# Patient Record
Sex: Female | Born: 1965 | Race: Black or African American | Hispanic: No | Marital: Married | Smoking: Never smoker
Health system: Southern US, Community
[De-identification: ages and names within clinical notes are randomized; demographics above are authoritative.]

## PROBLEM LIST (undated history)

## (undated) DIAGNOSIS — I209 Angina pectoris, unspecified: Secondary | ICD-10-CM

## (undated) DIAGNOSIS — M2550 Pain in unspecified joint: Secondary | ICD-10-CM

## (undated) DIAGNOSIS — F419 Anxiety disorder, unspecified: Secondary | ICD-10-CM

## (undated) DIAGNOSIS — IMO0001 Reserved for inherently not codable concepts without codable children: Secondary | ICD-10-CM

## (undated) DIAGNOSIS — I4891 Unspecified atrial fibrillation: Secondary | ICD-10-CM

## (undated) DIAGNOSIS — R7303 Prediabetes: Secondary | ICD-10-CM

## (undated) DIAGNOSIS — J42 Unspecified chronic bronchitis: Secondary | ICD-10-CM

## (undated) DIAGNOSIS — M549 Dorsalgia, unspecified: Secondary | ICD-10-CM

## (undated) DIAGNOSIS — R519 Headache, unspecified: Secondary | ICD-10-CM

## (undated) DIAGNOSIS — R079 Chest pain, unspecified: Secondary | ICD-10-CM

## (undated) DIAGNOSIS — E559 Vitamin D deficiency, unspecified: Secondary | ICD-10-CM

## (undated) DIAGNOSIS — R51 Headache: Secondary | ICD-10-CM

## (undated) DIAGNOSIS — S069X9A Unspecified intracranial injury with loss of consciousness of unspecified duration, initial encounter: Secondary | ICD-10-CM

## (undated) DIAGNOSIS — K219 Gastro-esophageal reflux disease without esophagitis: Secondary | ICD-10-CM

## (undated) DIAGNOSIS — N83209 Unspecified ovarian cyst, unspecified side: Secondary | ICD-10-CM

## (undated) DIAGNOSIS — R569 Unspecified convulsions: Secondary | ICD-10-CM

## (undated) DIAGNOSIS — G8929 Other chronic pain: Secondary | ICD-10-CM

## (undated) DIAGNOSIS — D219 Benign neoplasm of connective and other soft tissue, unspecified: Secondary | ICD-10-CM

## (undated) DIAGNOSIS — S0990XA Unspecified injury of head, initial encounter: Secondary | ICD-10-CM

## (undated) DIAGNOSIS — T4145XA Adverse effect of unspecified anesthetic, initial encounter: Secondary | ICD-10-CM

## (undated) DIAGNOSIS — T8859XA Other complications of anesthesia, initial encounter: Secondary | ICD-10-CM

## (undated) DIAGNOSIS — G459 Transient cerebral ischemic attack, unspecified: Secondary | ICD-10-CM

## (undated) HISTORY — DX: Transient cerebral ischemic attack, unspecified: G45.9

## (undated) HISTORY — DX: Chest pain, unspecified: R07.9

## (undated) HISTORY — DX: Unspecified atrial fibrillation: I48.91

## (undated) HISTORY — PX: OVARIAN CYST REMOVAL: SHX89

## (undated) HISTORY — DX: Vitamin D deficiency, unspecified: E55.9

## (undated) HISTORY — DX: Unspecified injury of head, initial encounter: S09.90XA

## (undated) HISTORY — DX: Benign neoplasm of connective and other soft tissue, unspecified: D21.9

## (undated) HISTORY — PX: APPENDECTOMY: SHX54

## (undated) HISTORY — DX: Unspecified ovarian cyst, unspecified side: N83.209

## (undated) HISTORY — DX: Pain in unspecified joint: M25.50

## (undated) HISTORY — PX: HERNIA REPAIR: SHX51

## (undated) HISTORY — DX: Unspecified intracranial injury with loss of consciousness of unspecified duration, initial encounter: S06.9X9A

---
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
## 2002-01-21 ENCOUNTER — Emergency Department (HOSPITAL_COMMUNITY): Admission: EM | Admit: 2002-01-21 | Discharge: 2002-01-21 | Payer: Self-pay | Admitting: Emergency Medicine

## 2002-05-26 ENCOUNTER — Emergency Department (HOSPITAL_COMMUNITY): Admission: EM | Admit: 2002-05-26 | Discharge: 2002-05-26 | Payer: Self-pay | Admitting: Emergency Medicine

## 2002-11-15 ENCOUNTER — Encounter: Payer: Self-pay | Admitting: Emergency Medicine

## 2002-11-15 ENCOUNTER — Emergency Department (HOSPITAL_COMMUNITY): Admission: EM | Admit: 2002-11-15 | Discharge: 2002-11-16 | Payer: Self-pay | Admitting: Emergency Medicine

## 2003-01-25 ENCOUNTER — Emergency Department (HOSPITAL_COMMUNITY): Admission: EM | Admit: 2003-01-25 | Discharge: 2003-01-25 | Payer: Self-pay | Admitting: Emergency Medicine

## 2003-04-30 ENCOUNTER — Encounter: Payer: Self-pay | Admitting: Emergency Medicine

## 2003-04-30 ENCOUNTER — Emergency Department (HOSPITAL_COMMUNITY): Admission: EM | Admit: 2003-04-30 | Discharge: 2003-04-30 | Payer: Self-pay | Admitting: Internal Medicine

## 2003-08-28 HISTORY — PX: ABDOMINAL HYSTERECTOMY: SHX81

## 2004-07-04 ENCOUNTER — Emergency Department (HOSPITAL_COMMUNITY): Admission: EM | Admit: 2004-07-04 | Discharge: 2004-07-04 | Payer: Self-pay | Admitting: Emergency Medicine

## 2004-08-30 ENCOUNTER — Ambulatory Visit: Payer: Self-pay | Admitting: Obstetrics and Gynecology

## 2004-11-17 ENCOUNTER — Emergency Department (HOSPITAL_COMMUNITY): Admission: EM | Admit: 2004-11-17 | Discharge: 2004-11-17 | Payer: Self-pay | Admitting: Emergency Medicine

## 2005-01-11 ENCOUNTER — Emergency Department (HOSPITAL_COMMUNITY): Admission: EM | Admit: 2005-01-11 | Discharge: 2005-01-11 | Payer: Self-pay | Admitting: Emergency Medicine

## 2005-03-05 ENCOUNTER — Ambulatory Visit: Payer: Self-pay | Admitting: Obstetrics and Gynecology

## 2005-05-07 ENCOUNTER — Emergency Department (HOSPITAL_COMMUNITY): Admission: EM | Admit: 2005-05-07 | Discharge: 2005-05-07 | Payer: Self-pay | Admitting: Emergency Medicine

## 2005-06-27 ENCOUNTER — Ambulatory Visit: Payer: Self-pay | Admitting: Obstetrics and Gynecology

## 2005-11-01 ENCOUNTER — Emergency Department (HOSPITAL_COMMUNITY): Admission: EM | Admit: 2005-11-01 | Discharge: 2005-11-02 | Payer: Self-pay | Admitting: Emergency Medicine

## 2006-01-01 ENCOUNTER — Ambulatory Visit: Admission: RE | Admit: 2006-01-01 | Discharge: 2006-01-01 | Payer: Self-pay | Admitting: Gynecologic Oncology

## 2006-01-21 ENCOUNTER — Ambulatory Visit: Payer: Self-pay | Admitting: Obstetrics and Gynecology

## 2006-01-28 ENCOUNTER — Ambulatory Visit (HOSPITAL_COMMUNITY): Admission: RE | Admit: 2006-01-28 | Discharge: 2006-01-28 | Payer: Self-pay | Admitting: Gynecologic Oncology

## 2006-02-05 ENCOUNTER — Encounter (INDEPENDENT_AMBULATORY_CARE_PROVIDER_SITE_OTHER): Payer: Self-pay | Admitting: *Deleted

## 2006-02-05 ENCOUNTER — Ambulatory Visit (HOSPITAL_COMMUNITY): Admission: RE | Admit: 2006-02-05 | Discharge: 2006-02-05 | Payer: Self-pay | Admitting: Gynecologic Oncology

## 2006-02-19 ENCOUNTER — Ambulatory Visit: Admission: RE | Admit: 2006-02-19 | Discharge: 2006-02-19 | Payer: Self-pay | Admitting: Gynecologic Oncology

## 2006-09-10 ENCOUNTER — Emergency Department (HOSPITAL_COMMUNITY): Admission: EM | Admit: 2006-09-10 | Discharge: 2006-09-10 | Payer: Self-pay | Admitting: Emergency Medicine

## 2006-09-29 ENCOUNTER — Emergency Department: Payer: Self-pay | Admitting: Emergency Medicine

## 2007-04-17 ENCOUNTER — Emergency Department (HOSPITAL_COMMUNITY): Admission: EM | Admit: 2007-04-17 | Discharge: 2007-04-17 | Payer: Self-pay | Admitting: Emergency Medicine

## 2007-04-18 ENCOUNTER — Emergency Department: Payer: Self-pay | Admitting: Emergency Medicine

## 2007-04-24 ENCOUNTER — Ambulatory Visit: Payer: Self-pay | Admitting: Obstetrics and Gynecology

## 2007-05-22 ENCOUNTER — Observation Stay (HOSPITAL_COMMUNITY): Admission: EM | Admit: 2007-05-22 | Discharge: 2007-05-24 | Payer: Self-pay | Admitting: Emergency Medicine

## 2007-05-22 ENCOUNTER — Encounter (INDEPENDENT_AMBULATORY_CARE_PROVIDER_SITE_OTHER): Payer: Self-pay | Admitting: Internal Medicine

## 2007-05-23 ENCOUNTER — Ambulatory Visit: Payer: Self-pay | Admitting: Cardiology

## 2007-05-28 ENCOUNTER — Ambulatory Visit: Payer: Self-pay | Admitting: Cardiology

## 2007-05-28 ENCOUNTER — Observation Stay (HOSPITAL_COMMUNITY): Admission: EM | Admit: 2007-05-28 | Discharge: 2007-05-31 | Payer: Self-pay | Admitting: Emergency Medicine

## 2007-06-09 ENCOUNTER — Ambulatory Visit: Payer: Self-pay | Admitting: Gastroenterology

## 2007-07-01 ENCOUNTER — Ambulatory Visit: Payer: Self-pay | Admitting: Cardiology

## 2007-11-21 ENCOUNTER — Emergency Department (HOSPITAL_COMMUNITY): Admission: EM | Admit: 2007-11-21 | Discharge: 2007-11-22 | Payer: Self-pay | Admitting: Emergency Medicine

## 2008-01-31 ENCOUNTER — Emergency Department: Payer: Self-pay | Admitting: Emergency Medicine

## 2008-04-28 ENCOUNTER — Ambulatory Visit: Payer: Self-pay | Admitting: Obstetrics and Gynecology

## 2008-06-07 ENCOUNTER — Emergency Department (HOSPITAL_BASED_OUTPATIENT_CLINIC_OR_DEPARTMENT_OTHER): Admission: EM | Admit: 2008-06-07 | Discharge: 2008-06-07 | Payer: Self-pay | Admitting: Emergency Medicine

## 2008-06-29 ENCOUNTER — Emergency Department (HOSPITAL_BASED_OUTPATIENT_CLINIC_OR_DEPARTMENT_OTHER): Admission: EM | Admit: 2008-06-29 | Discharge: 2008-06-29 | Payer: Self-pay | Admitting: Emergency Medicine

## 2008-11-17 ENCOUNTER — Emergency Department (HOSPITAL_BASED_OUTPATIENT_CLINIC_OR_DEPARTMENT_OTHER): Admission: EM | Admit: 2008-11-17 | Discharge: 2008-11-17 | Payer: Self-pay | Admitting: Emergency Medicine

## 2009-01-05 ENCOUNTER — Ambulatory Visit: Payer: Self-pay | Admitting: Orthopedic Surgery

## 2009-03-02 ENCOUNTER — Emergency Department (HOSPITAL_BASED_OUTPATIENT_CLINIC_OR_DEPARTMENT_OTHER): Admission: EM | Admit: 2009-03-02 | Discharge: 2009-03-02 | Payer: Self-pay | Admitting: Emergency Medicine

## 2009-03-02 ENCOUNTER — Ambulatory Visit: Payer: Self-pay | Admitting: Diagnostic Radiology

## 2009-03-22 ENCOUNTER — Emergency Department (HOSPITAL_COMMUNITY): Admission: EM | Admit: 2009-03-22 | Discharge: 2009-03-22 | Payer: Self-pay | Admitting: Emergency Medicine

## 2009-04-29 ENCOUNTER — Ambulatory Visit: Payer: Self-pay | Admitting: Obstetrics & Gynecology

## 2009-04-29 ENCOUNTER — Emergency Department (HOSPITAL_BASED_OUTPATIENT_CLINIC_OR_DEPARTMENT_OTHER): Admission: EM | Admit: 2009-04-29 | Discharge: 2009-04-30 | Payer: Self-pay | Admitting: Emergency Medicine

## 2009-05-28 ENCOUNTER — Emergency Department (HOSPITAL_BASED_OUTPATIENT_CLINIC_OR_DEPARTMENT_OTHER): Admission: EM | Admit: 2009-05-28 | Discharge: 2009-05-28 | Payer: Self-pay | Admitting: Emergency Medicine

## 2009-09-18 ENCOUNTER — Emergency Department (HOSPITAL_BASED_OUTPATIENT_CLINIC_OR_DEPARTMENT_OTHER): Admission: EM | Admit: 2009-09-18 | Discharge: 2009-09-18 | Payer: Self-pay | Admitting: Advanced Practice Midwife

## 2009-09-22 ENCOUNTER — Emergency Department: Payer: Self-pay | Admitting: Internal Medicine

## 2009-11-13 ENCOUNTER — Emergency Department (HOSPITAL_BASED_OUTPATIENT_CLINIC_OR_DEPARTMENT_OTHER): Admission: EM | Admit: 2009-11-13 | Discharge: 2009-11-13 | Payer: Self-pay | Admitting: Emergency Medicine

## 2009-11-17 ENCOUNTER — Ambulatory Visit: Payer: Self-pay | Admitting: Diagnostic Radiology

## 2009-11-17 ENCOUNTER — Emergency Department (HOSPITAL_BASED_OUTPATIENT_CLINIC_OR_DEPARTMENT_OTHER): Admission: EM | Admit: 2009-11-17 | Discharge: 2009-11-17 | Payer: Self-pay | Admitting: Emergency Medicine

## 2010-02-25 ENCOUNTER — Emergency Department (HOSPITAL_BASED_OUTPATIENT_CLINIC_OR_DEPARTMENT_OTHER): Admission: EM | Admit: 2010-02-25 | Discharge: 2010-02-26 | Payer: Self-pay | Admitting: Emergency Medicine

## 2010-03-07 ENCOUNTER — Emergency Department: Payer: Self-pay | Admitting: Emergency Medicine

## 2010-05-10 ENCOUNTER — Ambulatory Visit: Payer: Self-pay | Admitting: Obstetrics & Gynecology

## 2010-05-29 ENCOUNTER — Emergency Department (HOSPITAL_BASED_OUTPATIENT_CLINIC_OR_DEPARTMENT_OTHER): Admission: EM | Admit: 2010-05-29 | Discharge: 2010-05-29 | Payer: Self-pay | Admitting: Emergency Medicine

## 2010-10-20 ENCOUNTER — Emergency Department (INDEPENDENT_AMBULATORY_CARE_PROVIDER_SITE_OTHER): Payer: BC Managed Care – PPO

## 2010-10-20 ENCOUNTER — Emergency Department (HOSPITAL_BASED_OUTPATIENT_CLINIC_OR_DEPARTMENT_OTHER)
Admission: EM | Admit: 2010-10-20 | Discharge: 2010-10-20 | Disposition: A | Discharge status: Home / Self Care | Payer: BC Managed Care – PPO | Attending: Emergency Medicine | Admitting: Emergency Medicine

## 2010-10-20 DIAGNOSIS — Y92009 Unspecified place in unspecified non-institutional (private) residence as the place of occurrence of the external cause: Secondary | ICD-10-CM | POA: Insufficient documentation

## 2010-10-20 DIAGNOSIS — S93609A Unspecified sprain of unspecified foot, initial encounter: Secondary | ICD-10-CM | POA: Insufficient documentation

## 2010-10-20 DIAGNOSIS — G40909 Epilepsy, unspecified, not intractable, without status epilepticus: Secondary | ICD-10-CM | POA: Insufficient documentation

## 2010-10-20 DIAGNOSIS — M79609 Pain in unspecified limb: Secondary | ICD-10-CM

## 2010-10-20 DIAGNOSIS — W208XXA Other cause of strike by thrown, projected or falling object, initial encounter: Secondary | ICD-10-CM

## 2010-10-20 DIAGNOSIS — Z79899 Other long term (current) drug therapy: Secondary | ICD-10-CM | POA: Insufficient documentation

## 2010-10-20 DIAGNOSIS — J45909 Unspecified asthma, uncomplicated: Secondary | ICD-10-CM | POA: Insufficient documentation

## 2010-10-20 DIAGNOSIS — M7989 Other specified soft tissue disorders: Secondary | ICD-10-CM | POA: Insufficient documentation

## 2010-11-19 LAB — URINALYSIS, ROUTINE W REFLEX MICROSCOPIC
Bilirubin Urine: NEGATIVE
Glucose, UA: NEGATIVE mg/dL
Hgb urine dipstick: NEGATIVE
Ketones, ur: NEGATIVE mg/dL
Nitrite: NEGATIVE
Protein, ur: NEGATIVE mg/dL
Specific Gravity, Urine: 1.022 (ref 1.005–1.030)
Urobilinogen, UA: 1 mg/dL (ref 0.0–1.0)
pH: 7 (ref 5.0–8.0)

## 2010-11-30 LAB — URINE CULTURE: Colony Count: NO GROWTH

## 2010-11-30 LAB — CBC
HCT: 45.3 % (ref 36.0–46.0)
Hemoglobin: 15.1 g/dL — ABNORMAL HIGH (ref 12.0–15.0)
MCHC: 33.4 g/dL (ref 30.0–36.0)
MCV: 82.6 fL (ref 78.0–100.0)
Platelets: 192 10*3/uL (ref 150–400)
RBC: 5.48 MIL/uL — ABNORMAL HIGH (ref 3.87–5.11)
RDW: 14.6 % (ref 11.5–15.5)
WBC: 6.6 10*3/uL (ref 4.0–10.5)

## 2010-11-30 LAB — COMPREHENSIVE METABOLIC PANEL
ALT: 24 U/L (ref 0–35)
AST: 20 U/L (ref 0–37)
Albumin: 4.5 g/dL (ref 3.5–5.2)
Alkaline Phosphatase: 132 U/L — ABNORMAL HIGH (ref 39–117)
BUN: 14 mg/dL (ref 6–23)
CO2: 26 mEq/L (ref 19–32)
Calcium: 9.4 mg/dL (ref 8.4–10.5)
Chloride: 108 mEq/L (ref 96–112)
Creatinine, Ser: 1 mg/dL (ref 0.4–1.2)
GFR calc Af Amer: >60 mL/min (ref >60)
GFR calc non Af Amer: >60 mL/min (ref >60)
Glucose, Bld: 112 mg/dL — ABNORMAL HIGH (ref 70–99)
Potassium: 4.4 mEq/L (ref 3.5–5.1)
Sodium: 143 mEq/L (ref 135–145)
Total Bilirubin: 0.5 mg/dL (ref 0.3–1.2)
Total Protein: 8.5 g/dL — ABNORMAL HIGH (ref 6.0–8.3)

## 2010-11-30 LAB — URINALYSIS, ROUTINE W REFLEX MICROSCOPIC
Bilirubin Urine: NEGATIVE
Ketones, ur: 15 mg/dL — AB
Leukocytes, UA: NEGATIVE
Nitrite: NEGATIVE
Protein, ur: NEGATIVE mg/dL
Urobilinogen, UA: 1 mg/dL (ref 0.0–1.0)

## 2010-11-30 LAB — DIFFERENTIAL
Basophils Absolute: 0.1 10*3/uL (ref 0.0–0.1)
Basophils Relative: 1 % (ref 0–1)
Eosinophils Absolute: 0 10*3/uL (ref 0.0–0.7)
Lymphs Abs: 0.9 10*3/uL (ref 0.7–4.0)
Neutrophils Relative %: 82 % — ABNORMAL HIGH (ref 43–77)

## 2010-11-30 LAB — LIPASE, BLOOD: Lipase: 34 U/L (ref 23–300)

## 2010-12-29 ENCOUNTER — Emergency Department (HOSPITAL_BASED_OUTPATIENT_CLINIC_OR_DEPARTMENT_OTHER)
Admission: EM | Admit: 2010-12-29 | Discharge: 2010-12-29 | Disposition: A | Discharge status: Home / Self Care | Payer: BC Managed Care – PPO | Attending: Emergency Medicine | Admitting: Emergency Medicine

## 2010-12-29 DIAGNOSIS — Z79899 Other long term (current) drug therapy: Secondary | ICD-10-CM | POA: Insufficient documentation

## 2010-12-29 DIAGNOSIS — N760 Acute vaginitis: Secondary | ICD-10-CM | POA: Insufficient documentation

## 2010-12-29 DIAGNOSIS — J45909 Unspecified asthma, uncomplicated: Secondary | ICD-10-CM | POA: Insufficient documentation

## 2010-12-29 LAB — WET PREP, GENITAL
Clue Cells Wet Prep HPF POC: NONE SEEN
Trich, Wet Prep: NONE SEEN
Yeast Wet Prep HPF POC: NONE SEEN

## 2010-12-29 LAB — URINALYSIS, ROUTINE W REFLEX MICROSCOPIC
Bilirubin Urine: NEGATIVE
Hgb urine dipstick: NEGATIVE
Ketones, ur: NEGATIVE mg/dL
Nitrite: NEGATIVE
Urobilinogen, UA: 1 mg/dL (ref 0.0–1.0)

## 2010-12-29 LAB — URINE MICROSCOPIC-ADD ON

## 2010-12-29 LAB — PREGNANCY, URINE: Preg Test, Ur: NEGATIVE

## 2011-01-01 LAB — GC/CHLAMYDIA PROBE AMP, GENITAL
Chlamydia, DNA Probe: UNDETERMINED
GC Probe Amp, Genital: NEGATIVE

## 2011-01-05 ENCOUNTER — Emergency Department (HOSPITAL_BASED_OUTPATIENT_CLINIC_OR_DEPARTMENT_OTHER)
Admission: EM | Admit: 2011-01-05 | Discharge: 2011-01-05 | Disposition: A | Discharge status: Home / Self Care | Payer: BC Managed Care – PPO | Attending: Emergency Medicine | Admitting: Emergency Medicine

## 2011-01-05 ENCOUNTER — Emergency Department (INDEPENDENT_AMBULATORY_CARE_PROVIDER_SITE_OTHER): Payer: BC Managed Care – PPO

## 2011-01-05 DIAGNOSIS — K5730 Diverticulosis of large intestine without perforation or abscess without bleeding: Secondary | ICD-10-CM | POA: Insufficient documentation

## 2011-01-05 DIAGNOSIS — M549 Dorsalgia, unspecified: Secondary | ICD-10-CM

## 2011-01-05 DIAGNOSIS — Z79899 Other long term (current) drug therapy: Secondary | ICD-10-CM | POA: Insufficient documentation

## 2011-01-05 DIAGNOSIS — J45909 Unspecified asthma, uncomplicated: Secondary | ICD-10-CM | POA: Insufficient documentation

## 2011-01-05 DIAGNOSIS — R112 Nausea with vomiting, unspecified: Secondary | ICD-10-CM | POA: Insufficient documentation

## 2011-01-05 DIAGNOSIS — R109 Unspecified abdominal pain: Secondary | ICD-10-CM

## 2011-01-05 LAB — DIFFERENTIAL
Eosinophils Absolute: 0.1 10*3/uL (ref 0.0–0.7)
Eosinophils Relative: 1 % (ref 0–5)
Lymphocytes Relative: 46 % (ref 12–46)
Lymphs Abs: 3.7 10*3/uL (ref 0.7–4.0)
Monocytes Absolute: 0.5 10*3/uL (ref 0.1–1.0)

## 2011-01-05 LAB — URINALYSIS, ROUTINE W REFLEX MICROSCOPIC
Bilirubin Urine: NEGATIVE
Ketones, ur: 15 mg/dL — AB
Nitrite: NEGATIVE
Protein, ur: NEGATIVE mg/dL
Urobilinogen, UA: 0.2 mg/dL (ref 0.0–1.0)
pH: 5.5 (ref 5.0–8.0)

## 2011-01-05 LAB — CBC
HCT: 40.4 % (ref 36.0–46.0)
MCHC: 34.2 g/dL (ref 30.0–36.0)
MCV: 78.9 fL (ref 78.0–100.0)
Platelets: 214 10*3/uL (ref 150–400)
RDW: 15 % (ref 11.5–15.5)
WBC: 8.2 10*3/uL (ref 4.0–10.5)

## 2011-01-05 LAB — BASIC METABOLIC PANEL
BUN: 16 mg/dL (ref 6–23)
Calcium: 9.9 mg/dL (ref 8.4–10.5)
Creatinine, Ser: 1 mg/dL (ref 0.4–1.2)
GFR calc non Af Amer: >60 mL/min (ref >60)
Glucose, Bld: 88 mg/dL (ref 70–99)

## 2011-01-07 LAB — URINE CULTURE: Culture  Setup Time: 201205120025

## 2011-01-09 NOTE — Discharge Summary (Signed)
NAME:  [name] [name], [name] [name]               ACCOUNT NO.:  [id number]   MEDICAL RECORD NO.:  [id number]          PATIENT TYPE:  OBV   LOCATION:  2036                         FACILITY:  [facility]   PHYSICIAN:  Bettey Mare. [place], NPDATE OF BIRTH:  1966/05/08   DATE OF ADMISSION:  05/28/2007  DATE OF DISCHARGE:  05/31/2007                               DISCHARGE SUMMARY   ADDENDUM:  Time spent with the patient to include physician time 30  minutes.      [name], NP     KML/MEDQ  D:  05/31/2007  T:  05/31/2007  Job:  [id number]

## 2011-01-09 NOTE — Assessment & Plan Note (Signed)
[facility]                            CARDIOLOGY OFFICE NOTE   NAME:[name] [name]                      MRN:          [id number]  DATE:07/01/2007                            DOB:          05/28/1966    The patient is here for final cardiology follow-up.  She had been  admitted to [facility].  She was discharged and had  some recurrent symptoms and so she was admitted to [facility].  She underwent two-day stress perfusion scan.  She was found to have  GERD.  She was allowed to return to work.   Today she is having the beginning of a migraine, but she is not having  any chest pain.   ALLERGIES:  PENICILLIN.   MEDICATIONS:  Clonazepam, Lamictal, Topamax, Treximet, aspirin,  Protonix.   PAST MEDICAL HISTORY:  See the list below.   REVIEW OF SYSTEMS:  Other than a mild headache, she is stable today.   PHYSICAL EXAMINATION:  VITAL SIGNS:  Blood pressure 118/76, pulse 84.  The patient is oriented to person, time, and place.  Affect is normal.  HEENT:  No xanthelasma.  She has normal extraocular motion.  NECK:  No carotid bruits, no jugular venous distention.  LUNGS:  Clear.  Respiratory effort is not labored.  HEART:  S1 with S2.  No clicks or significant murmurs.  ABDOMEN:  Soft.  She has no peripheral edema.   PROBLEM LIST:  1. Chest pain that was not cardiac in origin.  2. Gastroesophageal reflux disease.  3. Migraine headaches.  4. History of questionable seizure disorder in the past.  5. History of hypertension.  6. Obesity.  7. Ejection fraction 57%.   The patient is stable at this time.  She does not need any further  cardiac workup.     Luis Abed, MD, [facility]  Electronically Signed   JDK/MedQ  DD: 07/01/2007  DT: 07/02/2007  Job #: [id number]   cc:   [name] [name]

## 2011-01-09 NOTE — H&P (Signed)
NAME:  [name] [name], [name] [name]               ACCOUNT NO.:  [id number]   MEDICAL RECORD NO.:  [id number]          PATIENT TYPE:  INP   LOCATION:  1825                         FACILITY:  [facility]   PHYSICIAN:  Jonelle Sidle, MD DATE OF BIRTH:  04/22/1966   DATE OF ADMISSION:  05/28/2007  DATE OF DISCHARGE:                              HISTORY & PHYSICAL   PRIMARY CARE PHYSICIAN:  Dr. Burnett Sheng.   PRIMARY CARDIOLOGIST:  Luis Abed, MD, [facility].   REASON FOR ADMISSION:  Continuous chest pain   HISTORY OF PRESENT ILLNESS:  [name] [name] is a 45 year old woman recently  discharged from hospital on September 27.  She was seen in consultation  by Dr. Myrtis Ser for chest pain which the patient states began on September  22 and has not resolved since that time.  She has a history of migraine  headaches and reportedly a seizure disorder as well as mild hypertension  and strong family history of cardiovascular disease.  Previous  evaluation during her most recent hospital stay revealed normal cardiac  markers without clear evidence of a high-risk acute coronary syndrome.  She also had a CT scan of the chest on September 25 which showed no  evidence of pulmonary emboli, normal thoracic aorta, and clear lung  fields.  I also see an abdominal ultrasound from September 25 which was  interpreted as being normal except for a heterogeneous-echodense liver.  The 2-D echocardiography from September 25 reported a normal left  ventricular ejection fraction of 60-65% without regional wall motion  abnormalities.  The pericardium was described as normal in appearance  with no evidence of effusion, and there were no major valvular  abnormalities.   [name] [name] was scheduled for a followup outpatient Myoview through our  office tomorrow, although came into the emergency department today given  concerns about her continued chest pain.  She describes it as being  worse when she lies flat, also associated with shortness of  breath and  also worse when she moves around.  Her electrocardiogram today shows  sinus rhythm, fairly vertical axis which could be related to lead  placement as it represents a change from the previous electrocardiogram  from September 26.  Decreased several R waves are noted which are new in  comparison to the prior tracing on September 26.  Repeat point-of-care  cardiac markers are normal at this time.  The patient has been treated  with a GI cocktail and Toradol by [name] Demark, [name] [name], and is now being  admitted for further evaluation.  She had no significant change in  symptoms other than with morphine.   ALLERGIES:  PENICILLIN and MYCINS.   MEDICATIONS AT HOME:  1. Clonazepam 1 mg 1/2 tablet p.o. p.r.n.  2. Lamictal 100 mg p.o. b.i.d.  3. Topamax 100 mg p.o. b.i.d.  4. Treximet 8.5/500 mg p.r.n.  5. Aspirin 325 mg p.o. daily.  6. Lopressor 12.5 mg p.o. b.i.d.   PAST MEDICAL HISTORY, SOCIAL HISTORY, AND FAMILY HISTORY:  Were all  reviewed recently.  Please see the History and Physical from September  26.  There has  been otherwise no major interval change.   REVIEW OF SYSTEMS:  As described in History of Present Illness.  No  cough, hemoptysis, fevers, or chills.   PHYSICAL EXAMINATION:  VITAL SIGNS:  Temperature 97.1 degrees, heart  rate 54, respirations 22, blood pressure 158/76, oxygen saturation 97%  on room air.  GENERAL:  This is an obese woman in no acute distress, although  complaining of chest pain.  HEENT:  Normal.  NECK:  Supple.  No elevated jugular venous pressure.  No bruits or  thyromegaly noted.  LUNGS:  Clear without labored breathing.  CARDIAC:  Exam reveals a regular rate and rhythm.  No pericardial rub is  evident.  No S3 gallop.  ABDOMEN:  Soft.  Mild epigastric tenderness and no guarding.  Bowel  sounds are present.  EXTREMITIES:  Exhibit no significant pitting edema.  Distal pulses 2+.  SKIN:  Warm and dry.  MUSCULOSKELETAL:  No kyphosis is  noted.  NEUROPSYCHIATRIC:  The patient is alert and oriented x3. Affect is  normal.   LABORATORY DATA:  WBC 6.7, hemoglobin 14.6, hematocrit 43.0, platelets  249.  INR 1.0.  Sodium 142, potassium 4.0, chloride 111, glucose 99, BUN  14, creatinine 0.9.  Initial point-of-care cardiac markers are normal.   IMPRESSION:  1. Continued chest pain with onset per patient on September 22.  The      patient is status post recent hospital stay for further evaluation      as discussed above which was overall reassuring.  She was actually      scheduled for a followup outpatient Myoview tomorrow through our      office but presented to the emergency department with continued      concerns about continuous chest pain.  Her repeat point-of-care      cardiac markers are normal.  Electrocardiogram does show some      changes in comparison to September 26, although these may be      related to lead placement.  2. Obesity.  3. Hypertension.  4. Family history of premature cardiovascular disease.  5. Reported seizure disorder.   PLAN:  The patient will be readmitted to the telemetry unit with plans  for continued cardiac markers and an inpatient Myoview given the  continuous nature of her symptoms and remaining concern.  She has had  some change in her electrocardiogram as well.  We will not  anticoagulated at this particular time given her normal cardiac markers.  She will be scheduled for an adenosine Myoview as an inpatient, and we  can proceed from there.  If this is reassuring, it may well be that her  symptoms could be gastrointestinal in etiology.  We will also add a  proton pump inhibitor to the present regimen.  Further plans to follow.      Jonelle Sidle, MD  Electronically Signed     SGM/MEDQ  D:  05/28/2007  T:  05/28/2007  Job:  [id number]   cc:   Luis Abed, MD, [facility]
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
## 2011-01-09 NOTE — Discharge Summary (Signed)
NAME:  [name] [name], [name] [name]               ACCOUNT NO.:  [id number]   MEDICAL RECORD NO.:  [id number]          PATIENT TYPE:  INP   LOCATION:  1440                         [facility]ACILITY:  [facility]   PHYSICIAN:  Madaline Savage, MD        DATE OF BIRTH:  [birth year]   DATE OF ADMISSION:  05/22/2007  DATE OF DISCHARGE:  05/24/2007                               DISCHARGE SUMMARY   PRIMARY CARE PHYSICIAN:  Dr. Burnett [name].   CONSULTATION:  Dr. Myrtis Ser saw the patient from Cardiology.   DISCHARGE DIAGNOSIS:  1. Atypical chest pain  2. Morbid obesity.  3. Mild hypertension.  4. History of seizure.  5. History of migraine.   DISCHARGE MEDICATIONS:  1. Clonazepam  1 mg daily.  2. Lamictal 100 mg twice daily.  3. Topamax 100 mg twice daily.  4. Treximet 85/500 once daily.  5. Aspirin 3-5 mg daily.  6. Lopressor 12.5 mg twice daily.   HISTORY OF PRESENT ILLNESS:  For full history and physical, see the  history and physical dictated by Dr. Christella Noa on May 22, 2007.  In  short, Ms, Burnett [name] is a 45 year old African-American lady who came in  with substernal chest pain over the last 3-4 days.  Her chest pain was  atypical in nature, and she had a history of accelerated coronary artery  disease in her family with her dad having an MI at the age of 68.  She  was admitted for further evaluation and management.   PROBLEMS:  #1 - ATYPICAL CHEST PAIN.  [name] is a 45 year old lady  with risk factors of obesity and strong family history who came in with  atypical chest pain.  We ruled out an acute coronary syndrome with three  sets of negative cardiac markers and Cardiology was consulted.  Dr. Myrtis Ser  is arranging for an outpatient stress test.  Dr. Henrietta Hoover office will call  her to arrange for it, and Dr. Myrtis Ser will see her as a follow-up.   #2 - MIGRAINE HEADACHES.  She sees her neurologist as an outpatient.  She is asked to follow-up with a neurologist for further management of  her migraines.   DISPOSITION:   She is now being discharged home in stable condition.  She  is asked to follow-up with Dr. Myrtis Ser as recommended.  She will also get  an outpatient stress test.      Madaline Savage, MD  Electronically Signed     PKN/MEDQ  D:  05/24/2007  T:  05/25/2007  Job:  [id number]

## 2011-01-09 NOTE — Discharge Summary (Signed)
NAME:  [name] [name], [name] [name] NO.:  [id number]   MEDICAL RECORD NO.:  [id number]          PATIENT TYPE:  OBV   LOCATION:  2036                         FACILITY:  [facility]   PHYSICIAN:  Luis Abed, MD, FACCDATE OF BIRTH:  1966/07/16   DATE OF ADMISSION:  05/28/2007  DATE OF DISCHARGE:  05/31/2007                               DISCHARGE SUMMARY   PRIMARY CARDIOLOGIST:  Luis Abed, MD, [facility].   PRIMARY CARE PHYSICIAN:  Dr. Doreene Eland.   CONSULTING PHYSICIAN:  Dr. Russella Dar, GI.   PROCEDURES PERFORMED DURING HOSPITALIZATION:  EGD.  This was performed  by Dr. Russella Dar on May 30, 2007 revealing GERD.   FINAL DISCHARGE DIAGNOSES:  1. Musculoskeletal pain.  2. Gastroesophageal reflux disease.  3. History of migraine headaches.  4. Questionable seizure disorder.  5. History of hypertension.  6. Obesity.   HOSPITAL COURSE:  This is a 45 year old, morbidly obese African American  female who presented to the ER secondary to recurrent chest pain on  May 28, 2007.  The patient was seen and examined by Dr. Nona Dell and Theodore Demark, physician's assistant.  The patient was  having ongoing chest pain greater than 1 week without any EKG changes.  The symptoms were consistent with musculoskeletal pain versus GI origin.  The patient was given a GI cocktail and Toradol and admitted to rule out  myocardial infarction.  The patient was not placed on any  anticoagulation at that time secondary to normal cardiac enzymes and  EKGs.   The patient subsequently had a 2-day stress myocardial perfusion study,  which was found to be normal without evidence of ischemia.  The patient  was also followed by Dr. Russella Dar in consultation secondary to recurrent  chest discomfort and was found to be diagnosed with GERD and placed on a  proton pump inhibitor.  The patient's pain was somewhat better.  She  continued to have lower chest wall tenderness with minimal epigastric  tenderness on  palpation.   The patient was seen and examined by Dr. Willa Rough on discharge and  found to be stable.  The patient's blood pressure was 110/72, heart rate  58, respirations 20 with an O2 saturation of 100%.  The patient will be  discharged home today to be followed by her primary care physician, Dr.  Doreene Eland.  She will be discharged on NSAIDs for musculoskeletal pain and  a proton pump inhibitor for GERD.  The patient will continue other  medications as directed without any further cardiac workup at this time.   DISCHARGE LABS:  Sodium 139, potassium 3.9, chloride of 112, CO2 24,  glucose 94, BUN 12, creatinine 1, hemoglobin 12.4, hematocrit 36.9,  white blood cell 6.8, platelets 239, d-dimer was negative at 0.34.  Patient's cardiac enzymes were found to be negative x3 at 0.01, 0.01 and  0.01, respectively.  Lipase was found to be normal at 22.  Amylase  normal at 64.  Chest x-ray revealed normal.   DISCHARGE MEDICATIONS:  1. Clonazepam 0.5 mg p.r.n.  2. Coated aspirin 325 daily.  3. Lopressor 25 mg twice  a day.  4. Topamax 100 mg twice a day.  5. Protonix 40 mg twice a day.  6. Lamictal 100 mg twice a day.  7. Naproxen sodium 500 mg q.8 h. p.r.n. pain.   ALLERGIES:  AMOXICILLIN, ERYTHROMYCIN, PENICILLIN.   FOLLOWUP PLANS AND APPOINTMENTS:  1. The patient will follow with Dr. Doreene Eland, her primary care      physician, for continued medical management and assessment of      response to medications.  2. The patient will see Dr. Willa Rough in 3-6 months for continued      evaluation.  Although this is noncardiac chest pain, blood pressure      control was essential.  3. 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  4. The patient has been advised on new medication, Protonix, which she      is to take twice a day for her GERD symptoms.      [name], NP      Luis Abed, MD, [facility]   Electronically Signed    KML/MEDQ  D:  05/31/2007  T:  05/31/2007  Job:  [id number]
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
## 2011-01-10 ENCOUNTER — Emergency Department (HOSPITAL_BASED_OUTPATIENT_CLINIC_OR_DEPARTMENT_OTHER)
Admission: EM | Admit: 2011-01-10 | Discharge: 2011-01-10 | Disposition: A | Discharge status: Home / Self Care | Payer: BC Managed Care – PPO | Attending: Emergency Medicine | Admitting: Emergency Medicine

## 2011-01-10 DIAGNOSIS — J45909 Unspecified asthma, uncomplicated: Secondary | ICD-10-CM | POA: Insufficient documentation

## 2011-01-10 DIAGNOSIS — Z79899 Other long term (current) drug therapy: Secondary | ICD-10-CM | POA: Insufficient documentation

## 2011-01-10 DIAGNOSIS — L089 Local infection of the skin and subcutaneous tissue, unspecified: Secondary | ICD-10-CM | POA: Insufficient documentation

## 2011-01-12 ENCOUNTER — Emergency Department (HOSPITAL_COMMUNITY)
Admission: EM | Admit: 2011-01-12 | Discharge: 2011-01-12 | Disposition: A | Discharge status: Home / Self Care | Payer: BC Managed Care – PPO | Attending: Emergency Medicine | Admitting: Emergency Medicine

## 2011-01-12 DIAGNOSIS — J45909 Unspecified asthma, uncomplicated: Secondary | ICD-10-CM | POA: Insufficient documentation

## 2011-01-12 DIAGNOSIS — R21 Rash and other nonspecific skin eruption: Secondary | ICD-10-CM | POA: Insufficient documentation

## 2011-01-12 DIAGNOSIS — L2989 Other pruritus: Secondary | ICD-10-CM | POA: Insufficient documentation

## 2011-01-12 DIAGNOSIS — L089 Local infection of the skin and subcutaneous tissue, unspecified: Secondary | ICD-10-CM | POA: Insufficient documentation

## 2011-01-12 DIAGNOSIS — R112 Nausea with vomiting, unspecified: Secondary | ICD-10-CM | POA: Insufficient documentation

## 2011-01-12 DIAGNOSIS — R569 Unspecified convulsions: Secondary | ICD-10-CM | POA: Insufficient documentation

## 2011-01-12 DIAGNOSIS — L298 Other pruritus: Secondary | ICD-10-CM | POA: Insufficient documentation

## 2011-01-12 NOTE — Op Note (Signed)
NAME:  [name] [name], [name] [name]               ACCOUNT NO.:  [id number]   MEDICAL RECORD NO.:  [id number]          PATIENT TYPE:  INP   LOCATION:  0007                         FACILITY:  [facility]   PHYSICIAN:  Paola A. Duard Brady, MD    DATE OF BIRTH:  January 28, 1966   DATE OF PROCEDURE:  02/05/2006  DATE OF DISCHARGE:                                 OPERATIVE REPORT   PREOPERATIVE DIAGNOSIS:  Pelvic pain.   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   PROCEDURE:  Diagnostic laparoscopy, removal of questionable bilateral  ovarian remnant, lysis of adhesions for one hour.   SURGEON:  Paola A. Duard Brady, M.D.   ASSISTANT:  Antionette Char, M.D.   ANESTHESIA:  General.   ANESTHESIOLOGIST:  Jenelle Mages. Fortune, M.D.   SPECIMENS:  Questionable bilateral ovarian remnant.   DISPOSITION:  Specimens to pathology.   ESTIMATED BLOOD LOSS:  Minimal.   IV FLUIDS:  3000 mL.   URINE OUTPUT:  200 mL.   COMPLICATIONS:  None.   OPERATIVE FINDINGS:  Included no masses within the pelvis.  There is some  questionable calcifications versus small bilateral ovarian remnants  measuring approximately 1.5 cm.  She had adhesive disease of the omentum and  small bowel to the anterior abdominal wall near the umbilicus.  There was no  pelvic masses, no adhesive disease within the pelvis.   The patient was taken to the operating room and placed in the supine  position.  Arms were then tucked to her comfort.  She was then placed under  general anesthesia.  She was then placed in dorsal lithotomy position with  all appropriate precautions.  Shoulder blocks were placed with all  appropriate precautions.  The abdomen was prepped in the usual sterile  fashion.  The perineum was prepped in usual sterile fashion.  Foley catheter  was inserted in the bladder under sterile conditions.  Sponge stick was  placed in the vagina.  She was then draped in the usual sterile fashion.   After  injecting 0.25% lidocaine, a 1.5-cm vertical incision was made  approximately 5 cm above the umbilicus.  The fascia was identified after  clearing off significant subcutaneous fat.  The fascia was identified,  grasped with Kocher clamps, tented and entered sharply with Mayo superior.  UR6 was used to suture ligate the fascial edges.  The Hasson was then  placed.  The abdomen was insufflated with CO2 gas.  Never did the patient's  pressure exceed 15 mmHg.  The findings as above were mentioned.  After  infiltrating the skin with 0.25% Marcaine, 5-mm trocars were placed  approximately 4 cm above the anterior-superior iliac spine, and a suprapubic  port was placed under her pannus.  Again, this was a 5-mm port.  We  identified the findings as above.  We opened the peritoneum on the patient's  right side using monopolar cautery.  Small ovarian remnant was then removed  from the peritoneal surface.  The ureter was identified, and we were well  above the ureter.  There was no significant blood supply to the small  ovarian remnant.  Our attention was then turned to the patient's left side.  In similar fashion, the ovarian remnant was grasped.  There was more  retroperitoneal adhesive disease on the patient's left side.  The  retroperitoneum was opened by opening the posterior leaf of broad ligament  and the peritoneal side wall with monopolar cautery.  The sidewall was then  dissected, and the ureter was identified.  The small calcifications versus  ovarian remnant was then removed using monopolar cautery.  The ovary was  noted to be hemostatic.  The patient was given 1 ampule of indigo carmine.  Once the anesthesiologist declared there was blue urine, abdomen was  inspected, and there was no blue in the abdomen.  The ureters were noted to  be ___________ dissection.  Attention was then drawn to the adhesive disease  along the anterior abdominal wall consisting of small bowel and omentum.   Approximately one hour was spent freeing these adhesions.  The adhesions  were freed from the lateral ports.  At one point, a 5-mm camera was needed  to be used through suprapubic port to allow visualization and access.  Dissection was made difficult by the patient's morbid obesity and  significant weight of the anterior abdominal wall.  As stated, these  adhesions were taken down with sharp dissection with visualization of the  peritoneal cavity.  There was no injury to the small bowel.  The abdomen and  pelvis were copiously irrigated.  All areas were noted to be hemostatic.  The ports were removed under direct visualization.  CO2 gas was removed from  the abdomen.  The fascial incision from the camera port was reapproximated  with UR6.  A separate figure-of-eight UR6 suture was needed to close the  fascia completely.  The skin was closed using subcu sutures of 4-0 Vicryl.  Steri-Strips and Band-Aids were placed.  Foley catheter and sponge stick  were removed from the vagina.   The patient tolerated the procedure well.  All needle, instrument and Ray-  Tec counts were correct x2.      Paola A. Duard Brady, MD  Electronically Signed     PAG/MEDQ  D:  02/05/2006  T:  02/05/2006  Job:  [id number]   cc:   Sharon Seller  Fax: [phone]   Melvyn Novas, M.D.  Fax: [phone]   Telford Nab, R.N.  [address]  [place], [place] [address]

## 2011-01-12 NOTE — Consult Note (Signed)
NAME:  [name] [name], [name] [name]               ACCOUNT NO.:  [id number]   MEDICAL RECORD NO.:  [id number]          PATIENT TYPE:  OUT   LOCATION:  GYN                          FACILITY:  [facility]   PHYSICIAN:  Paola A. Duard Brady, MD    DATE OF BIRTH:  1966-06-02   DATE OF CONSULTATION:  02/19/2006  DATE OF DISCHARGE:                                   CONSULTATION   [name] is a 45 year old with long history of chronic pelvic pain as well  as a very complicated GYN history.  She underwent a vaginal hysterectomy in  1990, underwent diagnostic laparoscopy September 2004 which revealed  extensive pelvic disease.  She subsequently underwent exploratory  laparotomy, lysis adhesions and BSO.  She did well until February 2007 at  which time she had some left lower quadrant pain, was seen in the emergency  room at which time there was a complex 3 cm adnexal mass concerning for an  ovarian remnant and was subsequently sent to [name] for evaluation.  I had met  with her and had had several lengthy conversations with her regarding this.  She did undergo an ultrasound prior to surgery that did not reveal any  masses and she still wished to proceed with surgery.  On February 05, 2006, she  underwent diagnostic laparoscopy, removal of a questionable bilateral  ovarian remnant, and lysis of adhesions for 1 hour.  Operative findings  included no masses within the pelvis.  There was some questionable  calcifications versus small bilateral ovarian remnant measuring each  approximately 1.5 cm.  She had adhesive disease of the omentum and small  bowel to the anterior abdominal wall near the umbilicus.  There are no  pelvic masses and no adhesive disease within the pelvis.  Final pathology of  the ovarian remnants revealed fragments of fiber adipose tissues and they  were submitted in their entirety.  She comes in today for her postoperative  check.  She states that preoperatively her pain was 10/10; it is currently  2/10.  She  is complaining of some dysuria.  She did take the Bactrim DS that  she was prescribed.  The symptoms resolved and then returned.  She will have  a urinalysis today.  She otherwise denies any complaints.  Abdomen:  She has  well-healing surgical incisions.  Abdomen is soft and nontender.   ASSESSMENT:  A 45 year old status post diagnostic laparoscopy, lysis of  adhesions, for what was felt to be an ovarian remnant and ended up being  chronic pelvic pain with an acute component and adhesive disease.   PLAN:  1.  Will check urinalysis and urine culture today.  She will be treated      should she have symptoms.  2.  She is very pleased with her results and that her pain is markedly      better.  She stated that she has a follow-up      with Dr. Greggory Keen on March 15, 2006, for her annual examination; she      will keep that.  She understands that we will release her at  this point.      We will be happy to see her in the future should the need arise.  Her      questions were elicited and answered to her satisfaction.  She will      continue her Premarin.      Paola A. Duard Brady, MD  Electronically Signed     PAG/MEDQ  D:  02/19/2006  T:  02/19/2006  Job:  [id number]   cc:   Sharon Seller  Fax: [phone]   Dr. Donnelly Angelica, [place]   Melvyn Novas, M.D.  Fax: [phone]   Telford Nab, R.N.  [address]  [place], [place] [address]

## 2011-01-12 NOTE — Consult Note (Signed)
NAME:  [name] [name], [name] [name]               ACCOUNT NO.:  [id number]   MEDICAL RECORD NO.:  [id number]          PATIENT TYPE:  OUT   LOCATION:  GYN                          FACILITY:  [facility]   PHYSICIAN:  Paola A. Duard Brady, MD    DATE OF BIRTH:  09-May-1966   DATE OF CONSULTATION:  01/01/2006  DATE OF DISCHARGE:                                   CONSULTATION   CONSULTING PHYSICIAN:  Paola A. Duard Brady, M.D.   REFERRING PHYSICIAN:  Dr. Daphine Deutscher [facility].   The patient is seen today in consultation at the request of Dr. Greggory Keen.  [name] [name] is a 45 year old gravida 8, para 1, abortus 7, who has a long  complicated past GYN history.  She underwent a vaginal hysterectomy in 1990,  and underwent diagnostic laparoscopy, September2004.  This revealed dense  pelvic adhesive disease and she subsequently underwent exploratory  laparotomy, extensive lysis of adhesions, and BSO in 2004.  These operative  notes are not available to me.  However, she did well until February2007.  At that time, per report she began experiencing some left lower quadrant  pain which was severe for four days.  She went to the emergency room at  which time there was a complex 3-cm adnexal mass.  She had a subsequent  ultrasound, the report of which I do not have, that revealed similar  findings.  Per her report, she had pain that was 8 to 9 out of 10, and a  decrease slowly with pain medication use.  She has been on a myriad of  hormone replacement treatment since 2005.  She was on an estrogen ring in  2005, which she subsequently stopped.  She then used an estradiol patch that  did not stick and she needed to use tape to cause it to adhere.  She had an  allergic reaction to the tape and did have for four months.  She started  using an Estring in 2007 and had some neurologic symptoms which she stopped  as a result of this.  She is now on Premarin 0.625 mg daily.  She was last  seen by Dr. Greggory Keen on [name] [name],  2007, and at that time she was still  having 7 out of 10 pain.  She was counseled regarding options including  Lupron.  Per one note, it was felt that she was already menopausal so it is  unclear of the benefit of Lupron.  She denies any change of her bowel or  bladder habits.  She states her pain is an 8 to 9 out of 10.  It is  constant.  She takes ibuprofen or Aleve with little relief.  She is still  able to accomplish her activities of daily living.  She states it feels like  a nagging pain, and this is starting to really bother her.   PAST MEDICAL HISTORY:  1.  Significant for seizure disorder.  Her last seizure was in 2003.  2.  Migraine headaches.   PAST SURGICAL HISTORY:  As above.  In addition, she had:  1.  A cesarean section 17  years ago.  Her son is alive and well.  2.  An appendectomy in 1990.  3.  Multiple D&Cs.   ALLERGIES:  1.  PENICILLIN which causes hives and throat swelling.  2.  SURGICAL TAPE.   SOCIAL HISTORY:  She denies tobacco.  She drinks alcohol occasionally.  She  is divorced.  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   MEDICATIONS:  1.  Clonazepam 1 mg daily.  2.  Depo-Provera 150 mg IM every 3 months.  3.  Lamictal 100 mg twice daily.  4.  Premarin 0.65 mg daily.  5.  Relpax p.r.n. migraines.  6.  Topamax 100 mg twice daily.   FAMILY HISTORY:  Significant for hypertension.  Her father had COPD.  He is  a smoker.  There is no cancer history in the family.  Her son has asthma.   PHYSICAL EXAMINATION:  VITAL SIGNS:  Weight 228 pounds, height 5 feet 2  inches, blood pressure 105/72, pulse 80, respirations 18.  GENERAL:  A well-nourished, well-developed female in no acute distress.  NECK:  Supple.  There is no lymphadenopathy, no thyromegaly.  LUNGS:  Clear to auscultation bilaterally.  CARDIOVASCULAR:  Regular rate and rhythm.  ABDOMEN:  Shows a well-healed laparoscopy skin incision, well healed  vertical midline  incision, as well as the transverse skin incision.  Abdomen  is obese, soft, nontender, nondistended.  There are no palpable masses or  hepatosplenomegaly.  There is no rebound or guarding.  Exam is limited by  habitus.  PELVIC:  Bimanual examination, there is no palpable masses.  Her tenderness  is distractible.   ASSESSMENT:  45 year old with reports of severe pain who on imaging has a  3-cm complex mass.   I had a lengthy discussion with the patient.  We do not know if this mass  has been there for some period of time are not.  She does not have imaging  prior to this.  Possibilities are that her pain is independent of this mass  and removal of this mass may not assist with her pain relief symptoms,  alternatively resection of the mass may alleviate her symptoms.  We  discussed surgical options, close follow up, and also CT-guided drainage.  After a lengthy discussion, she wished to proceed with surgery.  She does  understand the fact that this may not completely alleviate her pain.  She  also understands significant surgical risks associated with these types of  procedures involving injuries to bowel, bladder, ureters as a result of  significant scar tissue.  She understands there is no significant benefit to  [name] in terms of adhesion prevention material than if this is consistent with  a perineal inclusion cyst and is not related to her ovary but it may reform  in the future.  Her questions were answered and elicited to her  satisfaction.  After discussion of options including referral to [facility] to see Dr. Genevive Bi  in pelvic pain clinic, she wished to proceed with  surgery here.  She does not wish to proceed with surgery until after, February 02, 2006, as her son is graduating from high school.  We will, therefore,  tentatively schedule her for surgery for June12, 2007.  We will get her  scheduled for a repeat ultrasound, on January 28, 2006, to ensure that the mass is still  there.  She was given my card as well as that of [address]  [place] and knows that she  can call us, if she has any questions.  Risks, benefits, and alternatives of  surgery were discussed with the patient.  We spent over 30 minutes face-to-  face time of consultation alone.      Paola A. Duard Brady, MD  Electronically Signed     PAG/MEDQ  D:  01/01/2006  T:  01/02/2006  Job:  [id number]   cc:   Sharon Seller  Fax: [phone]   Melvyn Novas, M.D.  Fax: [phone]   Telford Nab, R.N.  [address]  [place], [place] [address]

## 2011-03-25 ENCOUNTER — Encounter: Payer: Self-pay | Admitting: Emergency Medicine

## 2011-03-25 ENCOUNTER — Emergency Department (HOSPITAL_BASED_OUTPATIENT_CLINIC_OR_DEPARTMENT_OTHER)
Admission: EM | Admit: 2011-03-25 | Discharge: 2011-03-25 | Disposition: A | Discharge status: Home / Self Care | Payer: BC Managed Care – PPO | Attending: Emergency Medicine | Admitting: Emergency Medicine

## 2011-03-25 DIAGNOSIS — G43909 Migraine, unspecified, not intractable, without status migrainosus: Secondary | ICD-10-CM

## 2011-03-25 HISTORY — DX: Unspecified convulsions: R56.9

## 2011-03-25 MED ORDER — SUMATRIPTAN SUCCINATE 6 MG/0.5ML ~~LOC~~ SOLN
6.0000 mg | Freq: Once | SUBCUTANEOUS | Status: AC
  Administered 2011-03-25: 6 mg via SUBCUTANEOUS
  Filled 2011-03-25: qty 0.5

## 2011-03-25 MED ORDER — METOCLOPRAMIDE HCL 5 MG/ML IJ SOLN
10.0000 mg | Freq: Once | INTRAMUSCULAR | Status: AC
  Administered 2011-03-25: 10 mg via INTRAMUSCULAR
  Filled 2011-03-25: qty 2

## 2011-03-25 MED ORDER — ONDANSETRON HCL 4 MG PO TABS: 4.0000 mg | ORAL_TABLET | Freq: Four times a day (QID) | ORAL | Status: AC

## 2011-03-25 MED ORDER — DIPHENHYDRAMINE HCL 50 MG/ML IJ SOLN
25.0000 mg | Freq: Once | INTRAMUSCULAR | Status: AC
  Administered 2011-03-25: 25 mg via INTRAMUSCULAR
  Filled 2011-03-25: qty 1

## 2011-03-25 MED ORDER — KETOROLAC TROMETHAMINE 60 MG/2ML IM SOLN
60.0000 mg | Freq: Once | INTRAMUSCULAR | Status: AC
  Administered 2011-03-25: 60 mg via INTRAMUSCULAR
  Filled 2011-03-25: qty 2

## 2011-03-25 NOTE — ED Provider Notes (Signed)
History     Chief Complaint  Patient presents with  . Migraine    Migraine headache from 5am vomited Imitrex soon after   HPI Comments: Pt states that she vomited up her imitrex:pt states that she has a similar history of headaches  Patient is a 45 y.o. female presenting with migraine. The history is provided by the patient. No language interpreter was used.  Migraine This is a recurrent problem. The current episode started today. The problem occurs constantly. The problem has been unchanged. Associated symptoms include nausea and vomiting. Pertinent negatives include no abdominal pain, fever, neck pain, numbness, rash, sore throat, swollen glands or weakness. Exacerbated by: light. Treatments tried: imitrex. The treatment provided no relief.    Past Medical History  Diagnosis Date  . Migraine   . Seizures     Past Surgical History  Procedure Date  . Abdominal hysterectomy   . Ceaserian     Family History  Problem Relation Age of Onset  . Hypertension Mother   . Heart failure Father     History  Substance Use Topics  . Smoking status: Never Smoker   . Smokeless tobacco: Not on file  . Alcohol Use: No    OB History    Grav Para Term Preterm Abortions TAB SAB Ect Mult Living                  Review of Systems  Constitutional: Negative for fever.  HENT: Negative for sore throat and neck pain.   Gastrointestinal: Positive for nausea and vomiting. Negative for abdominal pain.  Skin: Negative for rash.  Neurological: Negative for weakness and numbness.  All other systems reviewed and are negative.    Physical Exam  BP 118/69  Pulse 66  Temp(Src) 98.9 F (37.2 C) (Oral)  Resp 20  SpO2 99%  Physical Exam  Nursing note and vitals reviewed. Constitutional: She is oriented to person, place, and time. She appears well-developed and well-nourished.  HENT:  Head: Normocephalic and atraumatic.  Eyes: Pupils are equal, round, and reactive to light.  Neck: Normal  range of motion. Neck supple.  Cardiovascular: Normal rate and regular rhythm.   Pulmonary/Chest: Effort normal and breath sounds normal.  Musculoskeletal: Normal range of motion.  Neurological: She is alert and oriented to person, place, and time.  Skin: Skin is warm.  Psychiatric: She has a normal mood and affect.    ED Course  Procedures  MDM Pt is feeling better at this time:will send home with something for nausea      Teressa Lower, NP 03/25/11 1728

## 2011-03-25 NOTE — ED Notes (Signed)
Pt reports severe migraine with nausea from 5am this morning Vomited Imitrex this am

## 2011-04-14 NOTE — ED Provider Notes (Signed)
Medical screening examination/treatment/procedure(s) were performed by non-physician practitioner and as supervising physician I was immediately available for consultation/collaboration.   Rolan Bucco, MD 04/14/11 [phone]

## 2011-04-21 ENCOUNTER — Encounter (HOSPITAL_BASED_OUTPATIENT_CLINIC_OR_DEPARTMENT_OTHER): Payer: Self-pay | Admitting: *Deleted

## 2011-04-21 ENCOUNTER — Emergency Department (HOSPITAL_BASED_OUTPATIENT_CLINIC_OR_DEPARTMENT_OTHER)
Admission: EM | Admit: 2011-04-21 | Discharge: 2011-04-22 | Disposition: A | Discharge status: Home / Self Care | Payer: BC Managed Care – PPO | Attending: Emergency Medicine | Admitting: Emergency Medicine

## 2011-04-21 DIAGNOSIS — R51 Headache: Secondary | ICD-10-CM | POA: Insufficient documentation

## 2011-04-21 DIAGNOSIS — H1130 Conjunctival hemorrhage, unspecified eye: Secondary | ICD-10-CM | POA: Insufficient documentation

## 2011-04-21 DIAGNOSIS — H5710 Ocular pain, unspecified eye: Secondary | ICD-10-CM | POA: Insufficient documentation

## 2011-04-21 MED ORDER — HYDROCODONE-ACETAMINOPHEN 5-325 MG PO TABS
2.0000 | ORAL_TABLET | Freq: Once | ORAL | Status: AC
  Administered 2011-04-22: 2 via ORAL
  Filled 2011-04-21 (×2): qty 1

## 2011-04-21 NOTE — ED Notes (Signed)
Pt states she woke up at 0400 with left eye pain and noticed it was red. +photophobia. Vision is slightly blurred. Does not itch and is not draining.

## 2011-04-21 NOTE — ED Provider Notes (Signed)
History     CSN: [id number] Arrival date & time: 04/21/2011 10:37 PM  Chief Complaint  Patient presents with  . Eye Pain   Patient is a 45 y.o. female presenting with eye pain. The history is provided by the patient.  Eye Pain This is a new problem. The current episode started today. The problem occurs constantly. The problem has been unchanged. Associated symptoms include headaches. The symptoms are aggravated by nothing. She has tried nothing for the symptoms. The treatment provided no relief.  Pt complains of redness right eye.  Pt reports she noticed today.  Pt complains of a headache. Pt reports she has migranes,  Pt reports headache is typical for her headaches.  Pt concerned about eye redness. Past Medical History  Diagnosis Date  . Migraine   . Seizures     Past Surgical History  Procedure Date  . Abdominal hysterectomy   . Ceaserian     Family History  Problem Relation Age of Onset  . Hypertension Mother   . Heart failure Father     History  Substance Use Topics  . Smoking status: Never Smoker   . Smokeless tobacco: Not on file  . Alcohol Use: No    OB History    Grav Para Term Preterm Abortions TAB SAB Ect Mult Living                  Review of Systems  Eyes: Positive for pain.  Neurological: Positive for headaches.  All other systems reviewed and are negative.    Physical Exam  BP 141/75  Pulse 86  Temp(Src) 98.5 F (36.9 C) (Oral)  Resp 20  Ht 5' 2.5" (1.588 m)  Wt 268 lb (121.564 kg)  BMI 48.24 kg/m2  SpO2 98%  Physical Exam  Nursing note and vitals reviewed. Constitutional: She is oriented to person, place, and time. She appears well-developed and well-nourished.  HENT:  Head: Normocephalic and atraumatic.  Eyes: Conjunctivae and EOM are normal. Pupils are equal, round, and reactive to light. Right eye exhibits no discharge. Left eye exhibits no discharge. No scleral icterus.       Redness to eye corner  Neck: Normal range of motion.  Neck supple.  Pulmonary/Chest: Effort normal.  Musculoskeletal: Normal range of motion.  Neurological: She is alert and oriented to person, place, and time.  Skin: Skin is warm.  Psychiatric: She has a normal mood and affect.    ED Course  Procedures  MDM Patient counseled also conjunctival hemorrhage and headache. Patient is given phone number for ophthalmologist to followup with any problems she is advised to use artificial tears for discomfort patient is given 2 Vicodin here she is advised to return if any problems.     a  Langston Masker, [place] 04/21/11 2346  Langston Masker, [place] 04/26/11 1405  Langston Masker, [place] 04/26/11 1406  [place], [place] 05/07/11 0014  Langston Masker, [place] 05/10/11 1514  Langston Masker, [place] 05/10/11 1515  [place], [place] 05/10/11 1515  [place], [place] 05/31/11 1503  Langston Masker, [place] 05/31/11 1505  Langston Masker, [place] 06/09/11 1709  Langston Masker, [place] 06/14/11 1505

## 2011-05-07 ENCOUNTER — Emergency Department (HOSPITAL_COMMUNITY)
Admission: EM | Admit: 2011-05-07 | Discharge: 2011-05-07 | Disposition: A | Discharge status: Home / Self Care | Payer: BC Managed Care – PPO | Attending: Emergency Medicine | Admitting: Emergency Medicine

## 2011-05-11 ENCOUNTER — Emergency Department (INDEPENDENT_AMBULATORY_CARE_PROVIDER_SITE_OTHER): Payer: BC Managed Care – PPO

## 2011-05-11 ENCOUNTER — Encounter (HOSPITAL_BASED_OUTPATIENT_CLINIC_OR_DEPARTMENT_OTHER): Payer: Self-pay | Admitting: *Deleted

## 2011-05-11 DIAGNOSIS — R0602 Shortness of breath: Secondary | ICD-10-CM

## 2011-05-11 DIAGNOSIS — R05 Cough: Secondary | ICD-10-CM

## 2011-05-11 DIAGNOSIS — R070 Pain in throat: Secondary | ICD-10-CM

## 2011-05-11 DIAGNOSIS — J45909 Unspecified asthma, uncomplicated: Secondary | ICD-10-CM | POA: Insufficient documentation

## 2011-05-11 NOTE — ED Notes (Signed)
Sore throat, ear pain, sob and cough. Brown sputum. States she has using her inhaler 2 puffs every 4 hours.

## 2011-05-12 ENCOUNTER — Emergency Department (HOSPITAL_BASED_OUTPATIENT_CLINIC_OR_DEPARTMENT_OTHER)
Admission: EM | Admit: 2011-05-12 | Discharge: 2011-05-12 | Disposition: A | Discharge status: Home / Self Care | Payer: BC Managed Care – PPO | Attending: Diagnostic Radiology | Admitting: Diagnostic Radiology

## 2011-05-12 DIAGNOSIS — J45909 Unspecified asthma, uncomplicated: Secondary | ICD-10-CM

## 2011-05-12 MED ORDER — ALBUTEROL SULFATE HFA 108 (90 BASE) MCG/ACT IN AERS: 1.0000 | INHALATION_SPRAY | Freq: Four times a day (QID) | RESPIRATORY_TRACT | Status: DC | PRN

## 2011-05-12 MED ORDER — PREDNISONE 10 MG PO TABS: 20.0000 mg | ORAL_TABLET | Freq: Every day | ORAL | Status: AC

## 2011-05-12 MED ORDER — IPRATROPIUM BROMIDE 0.02 % IN SOLN
RESPIRATORY_TRACT | Status: AC
  Administered 2011-05-12: 0.5 mg
  Filled 2011-05-12: qty 2.5

## 2011-05-12 MED ORDER — ACETAMINOPHEN-CODEINE 120-12 MG/5ML PO SUSP: 5.0000 mL | Freq: Four times a day (QID) | ORAL | Status: AC | PRN

## 2011-05-12 MED ORDER — PREDNISONE 20 MG PO TABS
ORAL_TABLET | ORAL | Status: AC
  Administered 2011-05-12: 60 mg
  Filled 2011-05-12: qty 3

## 2011-05-12 MED ORDER — ALBUTEROL SULFATE (5 MG/ML) 0.5% IN NEBU
INHALATION_SOLUTION | RESPIRATORY_TRACT | Status: AC
  Administered 2011-05-12: 2.5 mg
  Filled 2011-05-12: qty 0.5

## 2011-05-12 MED ORDER — ANTIPYRINE-BENZOCAINE 5.4-1.4 % OT SOLN
OTIC | Status: AC
  Filled 2011-05-12: qty 10

## 2011-05-12 NOTE — ED Provider Notes (Signed)
History     CSN: [id number] Arrival date & time: 05/12/2011 12:27 AM   Chief Complaint  Patient presents with  . URI     (Include location/radiation/quality/duration/timing/severity/associated sxs/prior treatment) Patient is a 45 y.o. female presenting with URI. The history is provided by the patient.  URI The primary symptoms include fever, sore throat, cough, wheezing and myalgias. Primary symptoms do not include headaches, abdominal pain, vomiting or rash. The current episode started 2 days ago. This is a new problem. The problem has been gradually worsening.  Episode onset: subj fever at home today.  The sore throat began today.  The cough began 2 days ago. The cough is non-productive. There is nondescript sputum produced.  Onset: h/o asthma and is using her inhaler. Wheezing occurs frequently. The wheezing has been unchanged since its onset.  The illness is not associated with chills.   No sick contacts at home. No CP or ABD pain  Past Medical History  Diagnosis Date  . Migraine   . Seizures   . Asthma      Past Surgical History  Procedure Date  . Abdominal hysterectomy   . Ceaserian     Family History  Problem Relation Age of Onset  . Hypertension Mother   . Heart failure Father     History  Substance Use Topics  . Smoking status: Never Smoker   . Smokeless tobacco: Not on file  . Alcohol Use: No    OB History    Grav Para Term Preterm Abortions TAB SAB Ect Mult Living                  Review of Systems  Constitutional: Positive for fever. Negative for chills.  HENT: Positive for sore throat. Negative for neck pain and neck stiffness.   Eyes: Negative for pain.  Respiratory: Positive for cough and wheezing. Negative for shortness of breath.   Cardiovascular: Negative for chest pain, palpitations and leg swelling.  Gastrointestinal: Negative for vomiting and abdominal pain.  Genitourinary: Negative for dysuria.  Musculoskeletal: Positive for  myalgias. Negative for back pain.  Skin: Negative for rash.  Neurological: Negative for headaches.  All other systems reviewed and are negative.    Allergies  Amoxicillin; Erythromycin base; Penicillins; and Zithromax  Home Medications   Current Outpatient Rx  Name Route Sig Dispense Refill  . ALBUTEROL 90 MCG/ACT IN AERS Inhalation Inhale 2 puffs into the lungs every 6 (six) hours as needed. For shortness of breath     . CLONAZEPAM 1 MG PO TABS Oral Take 1 mg by mouth once as needed. For sleep or nerves     . ESTRADIOL 0.5 MG/0.5GM TD GEL Transdermal Place 1 application onto the skin daily.      Marland Kitchen LAMOTRIGINE 100 MG PO TABS Oral Take 100 mg by mouth 2 (two) times daily.     . IMITREX PO Oral Take 1 tablet by mouth once as needed. For migraine may repeat dose if needed     . TOPIRAMATE 100 MG PO TABS Oral Take 100 mg by mouth 2 (two) times daily.       Physical Exam    BP 122/70  Pulse 100  Temp(Src) 98.5 F (36.9 C) (Oral)  Resp 20  SpO2 99%  Physical Exam  Constitutional: She is oriented to person, place, and time. She appears well-developed and well-nourished.  HENT:  Head: Normocephalic and atraumatic. No trismus in the jaw.  Mouth/Throat: Mucous membranes are normal. No uvula swelling. No  oropharyngeal exudate.  Eyes: Conjunctivae and EOM are normal. Pupils are equal, round, and reactive to light.  Neck: Trachea normal. Neck supple. No thyromegaly present.  Cardiovascular: Normal rate, regular rhythm, S1 normal, S2 normal and normal pulses.     No systolic murmur is present   No diastolic murmur is present  Pulses:      Radial pulses are 2+ on the right side, and 2+ on the left side.  Pulmonary/Chest: Effort normal. She has no rhonchi.       No resp distress, dec breath sounds bilat, no audible wheezes  Abdominal: Soft. Normal appearance and bowel sounds are normal. There is no tenderness. There is no CVA tenderness and negative Murphy's sign.  Musculoskeletal:        BLE:s Calves nontender, no cords or erythema, negative Homans sign  Neurological: She is alert and oriented to person, place, and time. She has normal strength. No cranial nerve deficit or sensory deficit. GCS eye subscore is 4. GCS verbal subscore is 5. GCS motor subscore is 6.  Skin: Skin is warm and dry. No rash noted. She is not diaphoretic.  Psychiatric: Her speech is normal.       Cooperative and appropriate    ED Course  Procedures  Results for orders placed during the hospital encounter of 01/05/11  DIFFERENTIAL      Component Value Range   Neutrophils Relative 47  43 - 77 (%)   Neutro Abs 3.9  1.7 - 7.7 (K/uL)   Lymphocytes Relative 46  12 - 46 (%)   Lymphs Abs 3.7  0.7 - 4.0 (K/uL)   Monocytes Relative 6  3 - 12 (%)   Monocytes Absolute 0.5  0.1 - 1.0 (K/uL)   Eosinophils Relative 1  0 - 5 (%)   Eosinophils Absolute 0.1  0.0 - 0.7 (K/uL)   Basophils Relative 0  0 - 1 (%)   Basophils Absolute 0.0  0.0 - 0.1 (K/uL)  CBC      Component Value Range   WBC 8.2  4.0 - 10.5 (K/uL)   RBC 5.12 (*) 3.87 - 5.11 (MIL/uL)   Hemoglobin 13.8  12.0 - 15.0 (g/dL)   HCT 16.1  09.6 - 04.5 (%)   MCV 78.9  78.0 - 100.0 (fL)   MCH 27.0  26.0 - 34.0 (pg)   MCHC 34.2  30.0 - 36.0 (g/dL)   RDW 40.9  81.1 - 91.4 (%)   Platelets 214  150 - 400 (K/uL)  BASIC METABOLIC PANEL      Component Value Range   Sodium 138  135 - 145 (mEq/L)   Potassium 4.1  3.5 - 5.1 (mEq/L)   Chloride 106  96 - 112 (mEq/L)   CO2 19  19 - 32 (mEq/L)   Glucose, Bld 88  70 - 99 (mg/dL)   BUN 16  6 - 23 (mg/dL)   Creatinine, Ser 7.82  0.4 - 1.2 (mg/dL)   Calcium 9.9  8.4 - 95.6 (mg/dL)   GFR calc non Af Amer >60  >60 (mL/min)   GFR calc Af Amer    >60 (mL/min)   Value: >60            The eGFR has been calculated     using the MDRD equation.     This calculation has not been     validated in all clinical     situations.     eGFR's persistently     <60 mL/min signify  possible Chronic Kidney Disease.    URINALYSIS, ROUTINE W REFLEX MICROSCOPIC      Component Value Range   Color, Urine YELLOW  YELLOW    Appearance CLEAR  CLEAR    Specific Gravity, Urine 1.025  1.005 - 1.030    pH 5.5  5.0 - 8.0    Glucose, UA NEGATIVE  NEGATIVE (mg/dL)   Hgb urine dipstick NEGATIVE  NEGATIVE    Bilirubin Urine NEGATIVE  NEGATIVE    Ketones, ur 15 (*) NEGATIVE (mg/dL)   Protein, ur NEGATIVE  NEGATIVE (mg/dL)   Urobilinogen, UA 0.2  0.0 - 1.0 (mg/dL)   Nitrite NEGATIVE  NEGATIVE    Leukocytes, UA    NEGATIVE    Value: NEGATIVE MICROSCOPIC NOT DONE ON URINES WITH NEGATIVE PROTEIN, BLOOD, LEUKOCYTES, NITRITE, OR GLUCOSE <1000 mg/dL.  URINE CULTURE      Component Value Range   Specimen Description URINE, RANDOM     Special Requests NONE     Setup Time [id number]     Colony Count NO GROWTH     Culture NO GROWTH     Report Status 01/07/2011 FINAL     Dg Chest 2 View  05/11/2011  *RADIOLOGY REPORT*  Clinical Data: Productive cough.  Sore throat.  Shortness of breath.  History of seizures, migraines, asthma.  CHEST - 2 VIEW  Comparison: 05/28/2007  Findings: Cardiomediastinal silhouette is within normal limits. The lungs are free of focal consolidations and pleural effusions. No edema. Visualized osseous structures have a normal appearance.  IMPRESSION: Negative exam.  Original Report Authenticated By: Patterson Hammersmith, M.D.     No diagnosis found.   MDM  Clinical bronchitis with h/o asthma, CXR reviewed no infiltrate, no hypoxia, steroids and albuterol TX provided. Improved in ED and is stable for d/c home.        Sunnie Nielsen, MD 05/12/11 [phone]

## 2011-05-23 ENCOUNTER — Ambulatory Visit: Payer: Self-pay | Admitting: Obstetrics & Gynecology

## 2011-06-07 LAB — DIFFERENTIAL
Basophils Absolute: 0
Basophils Relative: 0
Eosinophils Relative: 1
Lymphocytes Relative: 49 — ABNORMAL HIGH
Lymphs Abs: 3.3
Monocytes Absolute: 0.5
Monocytes Absolute: 0.5
Monocytes Relative: 7
Neutro Abs: 2.9
Neutro Abs: 3.8

## 2011-06-07 LAB — COMPREHENSIVE METABOLIC PANEL
ALT: 33
AST: 21
Alkaline Phosphatase: 109
CO2: 22
Calcium: 9.2
GFR calc Af Amer: >60
GFR calc non Af Amer: >60
Glucose, Bld: 90
Potassium: 3.8
Sodium: 136
Total Protein: 7.8

## 2011-06-07 LAB — POCT CARDIAC MARKERS
CKMB, poc: 1.1
CKMB, poc: <1 — ABNORMAL LOW
Myoglobin, poc: 42.6
Myoglobin, poc: 45.7
Troponin i, poc: <0.05

## 2011-06-07 LAB — BASIC METABOLIC PANEL
BUN: 11
CO2: 24
CO2: 20
Calcium: 9.2
Chloride: 112
Creatinine, Ser: 0.84
GFR calc Af Amer: >60
GFR calc non Af Amer: >60
Glucose, Bld: 94
Glucose, Bld: 119 — ABNORMAL HIGH
Potassium: 3.9
Sodium: 139
Sodium: 142

## 2011-06-07 LAB — CARDIAC PANEL(CRET KIN+CKTOT+MB+TROPI)
CK, MB: 1
CK, MB: 1.2
CK, MB: 0.7
Relative Index: INVALID
Relative Index: INVALID
Total CK: 93
Troponin I: 0.01

## 2011-06-07 LAB — CBC
HCT: 36.9
HCT: 39.4
Hemoglobin: 12.4
Hemoglobin: 13.3
Hemoglobin: 12.7
MCHC: 33.7
MCHC: 34.7
MCV: 80.4
Platelets: 224
RBC: 4.59
RBC: 4.88
RDW: 14.6 — ABNORMAL HIGH
RDW: 14.8 — ABNORMAL HIGH
RDW: 15 — ABNORMAL HIGH
WBC: 6.7

## 2011-06-07 LAB — D-DIMER, QUANTITATIVE: D-Dimer, Quant: 0.52 — ABNORMAL HIGH

## 2011-06-07 LAB — PROTIME-INR
INR: 1
Prothrombin Time: 13.8

## 2011-06-07 LAB — I-STAT 8, (EC8 V) (CONVERTED LAB)
Bicarbonate: 21.9
HCT: 43
Hemoglobin: 14.6
Operator id: 294521
Sodium: 142
TCO2: 23
pCO2, Ven: 42.2 — ABNORMAL LOW

## 2011-06-07 LAB — LIPID PANEL
Cholesterol: 145
HDL: 33 — ABNORMAL LOW
Total CHOL/HDL Ratio: 4.4
Triglycerides: 72

## 2011-06-07 LAB — URINALYSIS, ROUTINE W REFLEX MICROSCOPIC
Bilirubin Urine: NEGATIVE
Hgb urine dipstick: NEGATIVE
Ketones, ur: NEGATIVE
Nitrite: NEGATIVE
Urobilinogen, UA: 0.2

## 2011-06-07 LAB — POCT I-STAT CREATININE
Creatinine, Ser: 0.9
Operator id: 294521

## 2011-06-07 LAB — TSH: TSH: 1.573

## 2011-06-07 LAB — TROPONIN I: Troponin I: 0.01

## 2011-06-07 LAB — CK TOTAL AND CKMB (NOT AT ARMC)
Total CK: 117
Total CK: 86

## 2011-06-07 LAB — LIPASE, BLOOD: Lipase: 22

## 2011-06-07 LAB — APTT
aPTT: 35
aPTT: 34

## 2011-06-19 NOTE — ED Provider Notes (Signed)
Medical screening examination/treatment/procedure(s) were performed by non-physician practitioner and as supervising physician I was immediately available for consultation/collaboration.   Joya Gaskins, MD 06/19/11 [phone]

## 2011-07-21 ENCOUNTER — Emergency Department (HOSPITAL_BASED_OUTPATIENT_CLINIC_OR_DEPARTMENT_OTHER)
Admission: EM | Admit: 2011-07-21 | Discharge: 2011-07-21 | Disposition: A | Discharge status: Home / Self Care | Payer: BC Managed Care – PPO | Attending: Emergency Medicine | Admitting: Emergency Medicine

## 2011-07-21 ENCOUNTER — Encounter (HOSPITAL_BASED_OUTPATIENT_CLINIC_OR_DEPARTMENT_OTHER): Payer: Self-pay | Admitting: *Deleted

## 2011-07-21 DIAGNOSIS — R350 Frequency of micturition: Secondary | ICD-10-CM | POA: Insufficient documentation

## 2011-07-21 DIAGNOSIS — J45909 Unspecified asthma, uncomplicated: Secondary | ICD-10-CM | POA: Insufficient documentation

## 2011-07-21 DIAGNOSIS — N390 Urinary tract infection, site not specified: Secondary | ICD-10-CM

## 2011-07-21 LAB — URINALYSIS, ROUTINE W REFLEX MICROSCOPIC
Glucose, UA: NEGATIVE mg/dL
Protein, ur: 30 mg/dL — AB
pH: 7 (ref 5.0–8.0)

## 2011-07-21 LAB — URINE MICROSCOPIC-ADD ON

## 2011-07-21 MED ORDER — SULFAMETHOXAZOLE-TRIMETHOPRIM 800-160 MG PO TABS: 1.0000 | ORAL_TABLET | Freq: Two times a day (BID) | ORAL | Status: AC

## 2011-07-21 MED ORDER — PHENAZOPYRIDINE HCL 200 MG PO TABS: 200.0000 mg | ORAL_TABLET | Freq: Three times a day (TID) | ORAL | Status: AC

## 2011-07-21 NOTE — ED Provider Notes (Signed)
Medical screening examination/treatment/procedure(s) were performed by non-physician practitioner and as supervising physician I was immediately available for consultation/collaboration.   Halford Goetzke A Annalee Meyerhoff, MD 07/21/11 2314 

## 2011-07-21 NOTE — ED Notes (Signed)
Entered room to discharge patient and she requested that a doctor come back in and examine her ear. States that she is having "bad" ear pain. PA notified.

## 2011-07-21 NOTE — ED Notes (Signed)
Pt states she has had frequency since this a.m.

## 2011-07-21 NOTE — ED Provider Notes (Signed)
History     CSN: [id number] Arrival date & time: 07/21/2011  9:07 PM   First MD Initiated Contact with Patient 07/21/11 2127      Chief Complaint  Patient presents with  . Urinary Frequency    (Consider location/radiation/quality/duration/timing/severity/associated sxs/prior treatment) HPI Comments: Patient here with a day history of burning with urination and frequency.  States that she noticed starting yesterday that her urine smelled bad, then this morning noticed the burning and bladder pain.  Denies fever, chills, back pain, vaginal discharge or bleeding.  Patient is a 45 y.o. female presenting with frequency. The history is provided by the patient. No language interpreter was used.  Urinary Frequency This is a new problem. The current episode started today. The problem occurs constantly. The problem has been unchanged. Associated symptoms include urinary symptoms. Pertinent negatives include no abdominal pain, anorexia, chills, coughing, fatigue, fever, myalgias, nausea, sore throat or vomiting. The symptoms are aggravated by nothing. She has tried nothing for the symptoms. The treatment provided no relief.    Past Medical History  Diagnosis Date  . Migraine   . Seizures   . Asthma     Past Surgical History  Procedure Date  . Abdominal hysterectomy   . Ceaserian     Family History  Problem Relation Age of Onset  . Hypertension Mother   . Heart failure Father     History  Substance Use Topics  . Smoking status: Never Smoker   . Smokeless tobacco: Not on file  . Alcohol Use: No    OB History    Grav Para Term Preterm Abortions TAB SAB Ect Mult Living                  Review of Systems  Constitutional: Negative for fever, chills and fatigue.  HENT: Negative for sore throat.   Respiratory: Negative for cough.   Gastrointestinal: Negative for nausea, vomiting, abdominal pain and anorexia.  Genitourinary: Positive for dysuria and frequency. Negative for  hematuria, flank pain, vaginal bleeding, vaginal discharge and pelvic pain.  Musculoskeletal: Negative for myalgias.  All other systems reviewed and are negative.    Allergies  Amoxicillin; Erythromycin base; Penicillins; and Zithromax  Home Medications   Current Outpatient Rx  Name Route Sig Dispense Refill  . ALBUTEROL SULFATE HFA 108 (90 BASE) MCG/ACT IN AERS Inhalation Inhale 1-2 puffs into the lungs every 6 (six) hours as needed for wheezing. 1 Inhaler 0  . ESTRADIOL 0.5 MG/0.5GM TD GEL Transdermal Place 1 application onto the skin daily.      Marland Kitchen LAMOTRIGINE 100 MG PO TABS Oral Take 100 mg by mouth 2 (two) times daily.     . SUMATRIPTAN SUCCINATE 100 MG PO TABS Oral Take 100 mg by mouth every 2 (two) hours as needed. For migraine     . TOPIRAMATE 100 MG PO TABS Oral Take 100 mg by mouth 2 (two) times daily.     . ALBUTEROL 90 MCG/ACT IN AERS Inhalation Inhale 2 puffs into the lungs every 6 (six) hours as needed. For shortness of breath     . CLONAZEPAM 1 MG PO TABS Oral Take 1 mg by mouth as needed. For onset of seizure    . IMITREX PO Oral Take 1 tablet by mouth once as needed. For migraine may repeat dose if needed      BP 165/94  Pulse 96  Temp(Src) 98 F (36.7 C) (Oral)  Resp 20  Ht 5\' 2"  (1.575 m)  Wt  261 lb (118.389 kg)  BMI 47.74 kg/m2  SpO2 100%  Physical Exam  Nursing note and vitals reviewed. Constitutional: She is oriented to person, place, and time. She appears well-developed and well-nourished.  HENT:  Head: Normocephalic and atraumatic.  Right Ear: External ear normal.  Left Ear: External ear normal.  Nose: Nose normal.  Mouth/Throat: Oropharynx is clear and moist.  Eyes: Conjunctivae are normal. Pupils are equal, round, and reactive to light.  Neck: Normal range of motion. Neck supple.  Cardiovascular: Normal rate, regular rhythm and normal heart sounds.   Pulmonary/Chest: Effort normal and breath sounds normal. No respiratory distress.  Abdominal:  Soft. Bowel sounds are normal. She exhibits no distension. There is no tenderness. There is no CVA tenderness.  Musculoskeletal: Normal range of motion.  Neurological: She is alert and oriented to person, place, and time.  Skin: Skin is warm and dry.  Psychiatric: She has a normal mood and affect. Her behavior is normal. Judgment and thought content normal.    ED Course  Procedures (including critical care time)  Labs Reviewed  URINALYSIS, ROUTINE W REFLEX MICROSCOPIC - Abnormal; Notable for the following:    Appearance CLOUDY (*)    Specific Gravity, Urine 1.031 (*)    Hgb urine dipstick MODERATE (*)    Ketones, ur 15 (*)    Protein, ur 30 (*)    Leukocytes, UA LARGE (*)    All other components within normal limits  URINE MICROSCOPIC-ADD ON - Abnormal; Notable for the following:    Bacteria, UA MANY (*)    All other components within normal limits   No results found.  UTI   MDM  Patient with simple UTI without signs of pyelonephritis.  Will send culture and treat.        Izola Price [place], [place] 07/21/11 2148

## 2011-07-23 LAB — URINE CULTURE

## 2011-07-24 NOTE — ED Notes (Signed)
+  urine Patient treated with Septra-sensitive to same-chart appended per protocol MD. 

## 2011-08-23 ENCOUNTER — Emergency Department (HOSPITAL_BASED_OUTPATIENT_CLINIC_OR_DEPARTMENT_OTHER)
Admission: EM | Admit: 2011-08-23 | Discharge: 2011-08-23 | Disposition: A | Discharge status: Home / Self Care | Payer: BC Managed Care – PPO | Attending: Emergency Medicine | Admitting: Emergency Medicine

## 2011-08-23 ENCOUNTER — Encounter (HOSPITAL_BASED_OUTPATIENT_CLINIC_OR_DEPARTMENT_OTHER): Payer: Self-pay | Admitting: *Deleted

## 2011-08-23 DIAGNOSIS — J45909 Unspecified asthma, uncomplicated: Secondary | ICD-10-CM | POA: Insufficient documentation

## 2011-08-23 DIAGNOSIS — IMO0001 Reserved for inherently not codable concepts without codable children: Secondary | ICD-10-CM | POA: Insufficient documentation

## 2011-08-23 DIAGNOSIS — J069 Acute upper respiratory infection, unspecified: Secondary | ICD-10-CM | POA: Insufficient documentation

## 2011-08-23 DIAGNOSIS — Z79899 Other long term (current) drug therapy: Secondary | ICD-10-CM | POA: Insufficient documentation

## 2011-08-23 MED ORDER — HYDROCOD POLST-CHLORPHEN POLST 10-8 MG/5ML PO LQCR: 5.0000 mL | Freq: Two times a day (BID) | ORAL | Status: DC | PRN

## 2011-08-23 MED ORDER — OSELTAMIVIR PHOSPHATE 75 MG PO CAPS: 75.0000 mg | ORAL_CAPSULE | Freq: Two times a day (BID) | ORAL | Status: AC

## 2011-08-23 NOTE — ED Notes (Signed)
Patient states she has been experiencing cold symptoms since yesterday, non-productive cough, achy, took alka-seltzer cold plus last night along with cough syrup, some nausea, able to drink fluids, no diarrhea/vomiting

## 2011-08-23 NOTE — ED Provider Notes (Signed)
History     CSN: [id number]  Arrival date & time 08/23/11  1478   First MD Initiated Contact with Patient 08/23/11 1058      Chief Complaint  Patient presents with  . URI    (Consider location/radiation/quality/duration/timing/severity/associated sxs/prior treatment) Patient is a 45 y.o. female presenting with URI. The history is provided by the patient.  URI The primary symptoms include fever, fatigue, headaches, sore throat, cough and myalgias. The current episode started yesterday. This is a new problem.  The onset of the illness is associated with exposure to sick contacts. Symptoms associated with the illness include chills.    Past Medical History  Diagnosis Date  . Migraine   . Seizures   . Asthma     Past Surgical History  Procedure Date  . Abdominal hysterectomy   . Ceaserian     Family History  Problem Relation Age of Onset  . Hypertension Mother   . Heart failure Father     History  Substance Use Topics  . Smoking status: Never Smoker   . Smokeless tobacco: Not on file  . Alcohol Use: No    OB History    Grav Para Term Preterm Abortions TAB SAB Ect Mult Living                  Review of Systems  Constitutional: Positive for fever, chills and fatigue.  HENT: Positive for sore throat.   Respiratory: Positive for cough.   Musculoskeletal: Positive for myalgias.  Neurological: Positive for headaches.  All other systems reviewed and are negative.    Allergies  Amoxicillin; Erythromycin base; Penicillins; and Zithromax  Home Medications   Current Outpatient Rx  Name Route Sig Dispense Refill  . ACETAMINOPHEN 325 MG PO TABS Oral Take 650 mg by mouth every 6 (six) hours as needed. For fever    . ALBUTEROL SULFATE HFA 108 (90 BASE) MCG/ACT IN AERS Inhalation Inhale 2 puffs into the lungs every 4 (four) hours as needed. For asthma     . CLONAZEPAM 1 MG PO TABS Oral Take 1 mg by mouth daily as needed. For onset of seizure and sleep    .  ESTRADIOL 0.5 MG/0.5GM TD GEL Transdermal Place 1 application onto the skin daily.     Marland Kitchen LAMOTRIGINE 100 MG PO TABS Oral Take 100 mg by mouth 2 (two) times daily.     . SUMATRIPTAN SUCCINATE 100 MG PO TABS Oral Take 100 mg by mouth every 2 (two) hours as needed. For migraine max 2 times per 24 hours    . TOPIRAMATE 100 MG PO TABS Oral Take 100 mg by mouth 2 (two) times daily.       BP 142/77  Pulse 92  Temp(Src) 98.8 F (37.1 C) (Oral)  Resp 21  SpO2 100%  Physical Exam  Nursing note and vitals reviewed. Constitutional: She is oriented to person, place, and time. She appears well-developed and well-nourished. No distress.  HENT:  Head: Normocephalic and atraumatic.  Neck: Normal range of motion. Neck supple.  Cardiovascular: Normal rate and regular rhythm.  Exam reveals no gallop and no friction rub.   No murmur heard. Pulmonary/Chest: Effort normal and breath sounds normal. No respiratory distress. She has no wheezes.  Abdominal: Soft. Bowel sounds are normal. She exhibits no distension. There is no tenderness.  Musculoskeletal: Normal range of motion.  Neurological: She is alert and oriented to person, place, and time.  Skin: Skin is warm and dry. She is not  diaphoretic.    ED Course  Procedures (including critical care time)  Labs Reviewed - No data to display No results found.   No diagnosis found.    MDM  Boyfriend had same, was given tamiflu and got better.  Patient requesting same.        Geoffery Lyons, MD 08/23/11 1106

## 2011-09-18 ENCOUNTER — Other Ambulatory Visit: Payer: Self-pay | Admitting: Obstetrics & Gynecology

## 2011-09-18 DIAGNOSIS — R1032 Left lower quadrant pain: Secondary | ICD-10-CM

## 2011-09-25 ENCOUNTER — Ambulatory Visit (HOSPITAL_COMMUNITY)
Admission: RE | Admit: 2011-09-25 | Discharge: 2011-09-25 | Disposition: A | Discharge status: Home / Self Care | Payer: BC Managed Care – PPO | Source: Ambulatory Visit | Attending: Obstetrics & Gynecology | Admitting: Obstetrics & Gynecology

## 2011-09-25 DIAGNOSIS — R1032 Left lower quadrant pain: Secondary | ICD-10-CM | POA: Insufficient documentation

## 2011-09-25 DIAGNOSIS — Z9079 Acquired absence of other genital organ(s): Secondary | ICD-10-CM | POA: Insufficient documentation

## 2011-09-25 DIAGNOSIS — Z90710 Acquired absence of both cervix and uterus: Secondary | ICD-10-CM | POA: Insufficient documentation

## 2011-09-25 DIAGNOSIS — N949 Unspecified condition associated with female genital organs and menstrual cycle: Secondary | ICD-10-CM | POA: Insufficient documentation

## 2011-09-25 MED ORDER — GADOBENATE DIMEGLUMINE 529 MG/ML IV SOLN
20.0000 mL | Freq: Once | INTRAVENOUS | Status: AC | PRN
  Administered 2011-09-25: 20 mL via INTRAVENOUS

## 2011-10-30 ENCOUNTER — Other Ambulatory Visit: Payer: Self-pay | Admitting: Obstetrics & Gynecology

## 2011-10-30 DIAGNOSIS — R928 Other abnormal and inconclusive findings on diagnostic imaging of breast: Secondary | ICD-10-CM

## 2011-10-30 DIAGNOSIS — N644 Mastodynia: Secondary | ICD-10-CM

## 2011-11-01 ENCOUNTER — Ambulatory Visit: Payer: Self-pay | Admitting: Obstetrics & Gynecology

## 2011-11-07 ENCOUNTER — Other Ambulatory Visit: Payer: BC Managed Care – PPO

## 2011-12-11 ENCOUNTER — Emergency Department (HOSPITAL_BASED_OUTPATIENT_CLINIC_OR_DEPARTMENT_OTHER)
Admission: EM | Admit: 2011-12-11 | Discharge: 2011-12-11 | Disposition: A | Discharge status: Home / Self Care | Payer: BC Managed Care – PPO | Attending: Emergency Medicine | Admitting: Emergency Medicine

## 2011-12-11 ENCOUNTER — Encounter (HOSPITAL_BASED_OUTPATIENT_CLINIC_OR_DEPARTMENT_OTHER): Payer: Self-pay | Admitting: *Deleted

## 2011-12-11 DIAGNOSIS — R569 Unspecified convulsions: Secondary | ICD-10-CM | POA: Insufficient documentation

## 2011-12-11 DIAGNOSIS — R110 Nausea: Secondary | ICD-10-CM | POA: Insufficient documentation

## 2011-12-11 DIAGNOSIS — R51 Headache: Secondary | ICD-10-CM | POA: Insufficient documentation

## 2011-12-11 DIAGNOSIS — J45909 Unspecified asthma, uncomplicated: Secondary | ICD-10-CM | POA: Insufficient documentation

## 2011-12-11 MED ORDER — ONDANSETRON HCL 4 MG PO TABS: 4.0000 mg | ORAL_TABLET | Freq: Four times a day (QID) | ORAL | Status: AC

## 2011-12-11 MED ORDER — KETOROLAC TROMETHAMINE 30 MG/ML IJ SOLN
30.0000 mg | Freq: Once | INTRAMUSCULAR | Status: AC
  Administered 2011-12-11: 30 mg via INTRAVENOUS
  Filled 2011-12-11: qty 1

## 2011-12-11 MED ORDER — METOCLOPRAMIDE HCL 5 MG/ML IJ SOLN
10.0000 mg | Freq: Once | INTRAMUSCULAR | Status: AC
  Administered 2011-12-11: 10 mg via INTRAVENOUS
  Filled 2011-12-11: qty 2

## 2011-12-11 MED ORDER — SODIUM CHLORIDE 0.9 % IV SOLN
Freq: Once | INTRAVENOUS | Status: AC
  Administered 2011-12-11: 13:00:00 via INTRAVENOUS

## 2011-12-11 MED ORDER — SODIUM CHLORIDE 0.9 % IV BOLUS (SEPSIS)
1000.0000 mL | Freq: Once | INTRAVENOUS | Status: AC
  Administered 2011-12-11: 1000 mL via INTRAVENOUS

## 2011-12-11 MED ORDER — HYDROMORPHONE HCL PF 1 MG/ML IJ SOLN
1.0000 mg | Freq: Once | INTRAMUSCULAR | Status: AC
  Administered 2011-12-11: 1 mg via INTRAVENOUS
  Filled 2011-12-11: qty 1

## 2011-12-11 NOTE — ED Provider Notes (Signed)
Medical screening examination/treatment/procedure(s) were performed by non-physician practitioner and as supervising physician I was immediately available for consultation/collaboration.   Loren Racer, MD 12/11/11 1515

## 2011-12-11 NOTE — Discharge Instructions (Signed)

## 2011-12-11 NOTE — ED Notes (Signed)
Karen Sofia, PA-C at bedside 

## 2011-12-11 NOTE — ED Notes (Signed)
Woke up this am with migraine headache has vomited x 6 took imitrex with no relief

## 2011-12-11 NOTE — ED Provider Notes (Signed)
History     CSN: [id number]  Arrival date & time 12/11/11  1043   First MD Initiated Contact with Patient 12/11/11 1214      Chief Complaint  Patient presents with  . Migraine    (Consider location/radiation/quality/duration/timing/severity/associated sxs/prior treatment) Patient is a 46 y.o. female presenting with migraine and headaches. The history is provided by the patient. No language interpreter was used.  Migraine This is a new problem. The current episode started today. The problem occurs constantly. The problem has been unchanged. Associated symptoms include headaches and nausea. The symptoms are aggravated by nothing. Treatments tried: imitrex. The treatment provided no relief.  Headache  This is a new problem. The current episode started 6 to 12 hours ago. The problem occurs constantly. The problem has been gradually worsening. The headache is associated with bright light. The pain is located in the bilateral region. The quality of the pain is described as sharp. The pain is at a severity of 7/10. The pain is moderate. The pain does not radiate. Associated symptoms include nausea. She has tried triptan therapy for the symptoms.    Past Medical History  Diagnosis Date  . Migraine   . Seizures   . Asthma     Past Surgical History  Procedure Date  . Abdominal hysterectomy   . Ceaserian     Family History  Problem Relation Age of Onset  . Hypertension Mother   . Heart failure Father     History  Substance Use Topics  . Smoking status: Never Smoker   . Smokeless tobacco: Not on file  . Alcohol Use: No    OB History    Grav Para Term Preterm Abortions TAB SAB Ect Mult Living                  Review of Systems  Gastrointestinal: Positive for nausea.  Neurological: Positive for headaches.  All other systems reviewed and are negative.    Allergies  Amoxicillin; Erythromycin base; Penicillins; and Zithromax  Home Medications   Current Outpatient Rx    Name Route Sig Dispense Refill  . ACETAMINOPHEN 325 MG PO TABS Oral Take 650 mg by mouth every 6 (six) hours as needed. For fever    . ALBUTEROL SULFATE HFA 108 (90 BASE) MCG/ACT IN AERS Inhalation Inhale 2 puffs into the lungs every 4 (four) hours as needed. For asthma     . HYDROCOD POLST-CPM POLST ER 10-8 MG/5ML PO LQCR Oral Take 5 mLs by mouth every 12 (twelve) hours as needed. 75 mL 0  . CLONAZEPAM 1 MG PO TABS Oral Take 1 mg by mouth daily as needed. For onset of seizure and sleep    . ESTRADIOL 0.5 MG/0.5GM TD GEL Transdermal Place 1 application onto the skin daily.     Marland Kitchen LAMOTRIGINE 100 MG PO TABS Oral Take 100 mg by mouth 2 (two) times daily.     . SUMATRIPTAN SUCCINATE 100 MG PO TABS Oral Take 100 mg by mouth every 2 (two) hours as needed. For migraine max 2 times per 24 hours    . TOPIRAMATE 100 MG PO TABS Oral Take 100 mg by mouth 2 (two) times daily.       BP 109/49  Pulse 58  Temp(Src) 98.1 F (36.7 C) (Oral)  Resp 20  SpO2 97%  Physical Exam  Nursing note and vitals reviewed. Constitutional: She is oriented to person, place, and time. She appears well-developed and well-nourished.  HENT:  Head: Normocephalic and  atraumatic.  Right Ear: External ear normal.  Left Ear: External ear normal.  Nose: Nose normal.  Mouth/Throat: Oropharynx is clear and moist.  Eyes: Conjunctivae and EOM are normal. Pupils are equal, round, and reactive to light.  Neck: Normal range of motion. Neck supple.  Cardiovascular: Normal rate and normal heart sounds.   Pulmonary/Chest: Breath sounds normal.  Abdominal: Soft.  Musculoskeletal: Normal range of motion.  Neurological: She is alert and oriented to person, place, and time. She has normal reflexes.  Skin: Skin is warm.  Psychiatric: She has a normal mood and affect.    ED Course  Procedures (including critical care time)  Labs Reviewed - No data to display No results found.   No diagnosis found.    MDM  Pt given Iv fluids  x 2 liters.  Pt given reglan, zofran and dilaudid.  Pt reports she feels much better.        Lonia Skinner [place], [place] 12/11/11 1441

## 2012-03-03 ENCOUNTER — Emergency Department (HOSPITAL_BASED_OUTPATIENT_CLINIC_OR_DEPARTMENT_OTHER)
Admission: EM | Admit: 2012-03-03 | Discharge: 2012-03-03 | Disposition: A | Discharge status: Home / Self Care | Payer: BC Managed Care – PPO | Attending: Emergency Medicine | Admitting: Emergency Medicine

## 2012-03-03 ENCOUNTER — Encounter (HOSPITAL_BASED_OUTPATIENT_CLINIC_OR_DEPARTMENT_OTHER): Payer: Self-pay | Admitting: Family Medicine

## 2012-03-03 DIAGNOSIS — G43909 Migraine, unspecified, not intractable, without status migrainosus: Secondary | ICD-10-CM | POA: Insufficient documentation

## 2012-03-03 NOTE — ED Notes (Signed)
Pt c/o migraine since Sunday morning with n/v. Pt sts she has h/o same.

## 2012-03-03 NOTE — ED Notes (Signed)
Pt walked out of room and sts "my alarm is going off and I have to go". Pt ambulating without difficulty, alert and oriented. Pt has not been seen by a provider.

## 2012-04-17 ENCOUNTER — Encounter: Payer: Self-pay | Admitting: Orthopedic Surgery

## 2012-04-27 ENCOUNTER — Encounter: Payer: Self-pay | Admitting: Orthopedic Surgery

## 2012-05-15 ENCOUNTER — Emergency Department (HOSPITAL_BASED_OUTPATIENT_CLINIC_OR_DEPARTMENT_OTHER)
Admission: EM | Admit: 2012-05-15 | Discharge: 2012-05-15 | Disposition: A | Discharge status: Home / Self Care | Payer: BC Managed Care – PPO | Attending: Emergency Medicine | Admitting: Emergency Medicine

## 2012-05-15 ENCOUNTER — Encounter (HOSPITAL_BASED_OUTPATIENT_CLINIC_OR_DEPARTMENT_OTHER): Payer: Self-pay | Admitting: *Deleted

## 2012-05-15 ENCOUNTER — Emergency Department (HOSPITAL_BASED_OUTPATIENT_CLINIC_OR_DEPARTMENT_OTHER): Payer: BC Managed Care – PPO

## 2012-05-15 DIAGNOSIS — Z79899 Other long term (current) drug therapy: Secondary | ICD-10-CM | POA: Insufficient documentation

## 2012-05-15 DIAGNOSIS — X500XXA Overexertion from strenuous movement or load, initial encounter: Secondary | ICD-10-CM | POA: Insufficient documentation

## 2012-05-15 DIAGNOSIS — S79929A Unspecified injury of unspecified thigh, initial encounter: Secondary | ICD-10-CM | POA: Insufficient documentation

## 2012-05-15 DIAGNOSIS — M25569 Pain in unspecified knee: Secondary | ICD-10-CM | POA: Insufficient documentation

## 2012-05-15 DIAGNOSIS — G40909 Epilepsy, unspecified, not intractable, without status epilepticus: Secondary | ICD-10-CM | POA: Insufficient documentation

## 2012-05-15 DIAGNOSIS — M25469 Effusion, unspecified knee: Secondary | ICD-10-CM | POA: Insufficient documentation

## 2012-05-15 DIAGNOSIS — J45909 Unspecified asthma, uncomplicated: Secondary | ICD-10-CM | POA: Insufficient documentation

## 2012-05-15 DIAGNOSIS — S79919A Unspecified injury of unspecified hip, initial encounter: Secondary | ICD-10-CM | POA: Insufficient documentation

## 2012-05-15 DIAGNOSIS — S8990XA Unspecified injury of unspecified lower leg, initial encounter: Secondary | ICD-10-CM

## 2012-05-15 MED ORDER — HYDROCODONE-ACETAMINOPHEN 5-325 MG PO TABS: 1.0000 | ORAL_TABLET | ORAL | Status: DC | PRN

## 2012-05-15 MED ORDER — NAPROXEN 500 MG PO TABS: 500.0000 mg | ORAL_TABLET | Freq: Two times a day (BID) | ORAL | Status: DC

## 2012-05-15 MED ORDER — KETOROLAC TROMETHAMINE 60 MG/2ML IM SOLN
60.0000 mg | Freq: Once | INTRAMUSCULAR | Status: AC
  Administered 2012-05-15: 60 mg via INTRAMUSCULAR
  Filled 2012-05-15: qty 2

## 2012-05-15 NOTE — ED Provider Notes (Signed)
History     CSN: [id number]  Arrival date & time 05/15/12  1816   First MD Initiated Contact with Patient 05/15/12 1920      Chief Complaint  Patient presents with  . Knee Pain    (Consider location/radiation/quality/duration/timing/severity/associated sxs/prior treatment) Patient is a 46 y.o. female presenting with knee pain. The history is provided by the patient and medical records.  Knee Pain Associated symptoms include joint swelling. Pertinent negatives include no numbness.    [name] [name] is a 46 y.o. female presents to the emergency department complaining of L knee.  The onset of the symptoms was  abrupt starting 2 days ago.  The patient has associated swelling.  The symptoms have been  persistent, gradually worsened.  Walking, straightening the leg makes the symptoms worse and nothing makes symptoms better.  The patient denies fever, chills, headache, neck pain, back pain, abdominal pain,, vomiting, diarrhea, shortness of breath.  Pt twisted her knee on the stairs 3 days ago.  Has had some pain since. Then today she spent most of the day in a pair of heels walking across the grass and gravel at a funeral.  Pt states the pain is under her kneecap and on the lateral side.  The pain is throbbing, aching, and sharp.  It is rated at a 7/10 and does not radiate.  She states her knee is swollen.  Pt has used heat, ice and icy hot without relief.  She also took aleve without relief.    Past Medical History  Diagnosis Date  . Migraine   . Seizures   . Asthma     Past Surgical History  Procedure Date  . Abdominal hysterectomy   . Ceaserian     Family History  Problem Relation Age of Onset  . Hypertension Mother   . Heart failure Father     History  Substance Use Topics  . Smoking status: Never Smoker   . Smokeless tobacco: Not on file  . Alcohol Use: No    OB History    Grav Para Term Preterm Abortions TAB SAB Ect Mult Living                  Review of Systems    Musculoskeletal: Positive for joint swelling.  Skin: Negative for wound.  Neurological: Negative for numbness.  All other systems reviewed and are negative.    Allergies  Amoxicillin; Biaxin; Erythromycin base; Penicillins; and Zithromax  Home Medications   Current Outpatient Rx  Name Route Sig Dispense Refill  . ACETAMINOPHEN 325 MG PO TABS Oral Take 650 mg by mouth every 6 (six) hours as needed. For fever    . ALBUTEROL SULFATE HFA 108 (90 BASE) MCG/ACT IN AERS Inhalation Inhale 2 puffs into the lungs every 4 (four) hours as needed. For asthma     . HYDROCOD POLST-CPM POLST ER 10-8 MG/5ML PO LQCR Oral Take 5 mLs by mouth every 12 (twelve) hours as needed. 75 mL 0  . CLONAZEPAM 1 MG PO TABS Oral Take 1 mg by mouth daily as needed. For onset of seizure and sleep    . ESTRADIOL 0.5 MG/0.5GM TD GEL Transdermal Place 1 application onto the skin daily.     Marland Kitchen LAMOTRIGINE 100 MG PO TABS Oral Take 100 mg by mouth 2 (two) times daily.     Marland Kitchen NAPROXEN 500 MG PO TABS Oral Take 1 tablet (500 mg total) by mouth 2 (two) times daily with a meal. 30 tablet 0  .  SUMATRIPTAN SUCCINATE 100 MG PO TABS Oral Take 100 mg by mouth every 2 (two) hours as needed. For migraine max 2 times per 24 hours    . TOPIRAMATE 100 MG PO TABS Oral Take 100 mg by mouth 2 (two) times daily.       BP 134/90  Pulse 93  Temp 98.5 F (36.9 C) (Oral)  Resp 16  Ht 5\' 2"  (1.575 m)  Wt 262 lb (118.842 kg)  BMI 47.92 kg/m2  SpO2 99%  Physical Exam  Nursing note and vitals reviewed. Constitutional: She appears well-developed and well-nourished. No distress.  HENT:  Head: Normocephalic and atraumatic.  Eyes: Conjunctivae normal are normal.  Neck: Normal range of motion.       Full ROM without pain  Cardiovascular: Normal rate, regular rhythm, normal heart sounds and intact distal pulses.  Exam reveals no gallop and no friction rub.   No murmur heard.      Capillary refill is less than 3 seconds  Pulmonary/Chest: Effort  normal and breath sounds normal. No respiratory distress. She has no wheezes.  Musculoskeletal: Normal range of motion. She exhibits edema (Left knee) and tenderness ( left knee).       ROM: Full range of motion of the left knee with pain Mild effusion of the L knee  Neurological: She is alert. Coordination normal.       Sensation left lower extremity intact to sharp and dull sensation; intact bilaterally as well Strength normal and bilateral lower terminates  Skin: Skin is warm and dry. She is not diaphoretic.    ED Course  Procedures (including critical care time)  Labs Reviewed - No data to display Dg Knee Complete 4 Views Left  05/15/2012  *RADIOLOGY REPORT*  Clinical Data: Fall, left knee pain/injury  LEFT KNEE - COMPLETE 4+ VIEW  Comparison: None.  Findings: No fracture or dislocation is seen.  The joint spaces are preserved.  The visualized soft tissues are unremarkable.  No definite suprapatellar knee joint effusion.  IMPRESSION: No fracture or dislocation is seen.   Original Report Authenticated By: Charline Bills, M.D.      1. Knee pain   2. Knee injury   3. Knee effusion       MDM  [name] [name] presents with knee pain pinning 2 days ago after she twisted and rest of the stairs. Patient neurovascularly intact with normal neuro exam.  Full range of motion with pain.  Low concern for fracture, likely sprain of the knee.  Knee x-rays no fracture dislocation seen.  Will place patient in knee immobilizer and have her followup with primary care physician or orthopedics if pain persists.  i have discussed this with the patient.  I have also discussed reasons to return immediately to the ER.  Patient expresses understanding and agrees with plan.  1. Medications: naprosyn 2. Treatment: rest, ice, compression, elevation of knee and leg 3. Follow Up: with [facility]          Dierdre Forth, PA-C 05/15/12 2120

## 2012-05-15 NOTE — ED Notes (Signed)
Pt c/o left knee pain x 2 days ago

## 2012-05-15 NOTE — ED Provider Notes (Signed)
Medical screening examination/treatment/procedure(s) were performed by non-physician practitioner and as supervising physician I was immediately available for consultation/collaboration.   Gavin Pound. Oletta Lamas, MD 05/15/12 2121

## 2012-05-27 ENCOUNTER — Encounter: Payer: Self-pay | Admitting: Orthopedic Surgery

## 2012-05-31 ENCOUNTER — Ambulatory Visit: Payer: Self-pay | Admitting: Orthopedic Surgery

## 2012-07-12 ENCOUNTER — Emergency Department (HOSPITAL_BASED_OUTPATIENT_CLINIC_OR_DEPARTMENT_OTHER)
Admission: EM | Admit: 2012-07-12 | Discharge: 2012-07-12 | Disposition: A | Discharge status: Home / Self Care | Payer: BC Managed Care – PPO | Attending: Emergency Medicine | Admitting: Emergency Medicine

## 2012-07-12 ENCOUNTER — Encounter (HOSPITAL_BASED_OUTPATIENT_CLINIC_OR_DEPARTMENT_OTHER): Payer: Self-pay | Admitting: *Deleted

## 2012-07-12 DIAGNOSIS — H53149 Visual discomfort, unspecified: Secondary | ICD-10-CM | POA: Insufficient documentation

## 2012-07-12 DIAGNOSIS — G43909 Migraine, unspecified, not intractable, without status migrainosus: Secondary | ICD-10-CM | POA: Insufficient documentation

## 2012-07-12 DIAGNOSIS — J45909 Unspecified asthma, uncomplicated: Secondary | ICD-10-CM | POA: Insufficient documentation

## 2012-07-12 DIAGNOSIS — R42 Dizziness and giddiness: Secondary | ICD-10-CM | POA: Insufficient documentation

## 2012-07-12 DIAGNOSIS — G40909 Epilepsy, unspecified, not intractable, without status epilepticus: Secondary | ICD-10-CM | POA: Insufficient documentation

## 2012-07-12 DIAGNOSIS — Z79899 Other long term (current) drug therapy: Secondary | ICD-10-CM | POA: Insufficient documentation

## 2012-07-12 DIAGNOSIS — R112 Nausea with vomiting, unspecified: Secondary | ICD-10-CM | POA: Insufficient documentation

## 2012-07-12 MED ORDER — HYDROMORPHONE HCL PF 1 MG/ML IJ SOLN
1.0000 mg | Freq: Once | INTRAMUSCULAR | Status: AC
  Administered 2012-07-12: 1 mg via INTRAVENOUS
  Filled 2012-07-12: qty 1

## 2012-07-12 MED ORDER — HYDROCODONE-ACETAMINOPHEN 5-325 MG PO TABS: 1.0000 | ORAL_TABLET | ORAL | Status: DC | PRN

## 2012-07-12 MED ORDER — ONDANSETRON HCL 4 MG PO TABS: 4.0000 mg | ORAL_TABLET | Freq: Four times a day (QID) | ORAL | Status: DC

## 2012-07-12 MED ORDER — DEXAMETHASONE SODIUM PHOSPHATE 10 MG/ML IJ SOLN
10.0000 mg | Freq: Once | INTRAMUSCULAR | Status: AC
  Administered 2012-07-12: 10 mg via INTRAVENOUS
  Filled 2012-07-12: qty 1

## 2012-07-12 MED ORDER — METOCLOPRAMIDE HCL 5 MG/ML IJ SOLN
10.0000 mg | Freq: Once | INTRAMUSCULAR | Status: AC
  Administered 2012-07-12: 10 mg via INTRAVENOUS
  Filled 2012-07-12: qty 2

## 2012-07-12 MED ORDER — SODIUM CHLORIDE 0.9 % IV SOLN
INTRAVENOUS | Status: DC
  Administered 2012-07-12: 15:00:00 via INTRAVENOUS

## 2012-07-12 MED ORDER — DIPHENHYDRAMINE HCL 50 MG/ML IJ SOLN
25.0000 mg | Freq: Once | INTRAMUSCULAR | Status: AC
  Administered 2012-07-12: 25 mg via INTRAVENOUS
  Filled 2012-07-12: qty 1

## 2012-07-12 NOTE — ED Notes (Signed)
t is calling family for discharge ride home

## 2012-07-12 NOTE — ED Provider Notes (Signed)
History     CSN: [id number]  Arrival date & time 07/12/12  1218   First MD Initiated Contact with Patient 07/12/12 1415      Chief Complaint  Patient presents with  . Migraine    (Consider location/radiation/quality/duration/timing/severity/associated sxs/prior treatment) Patient is a 46 y.o. female presenting with migraines. The history is provided by the patient and medical records. No language interpreter was used.  Migraine This is a recurrent problem. The current episode started more than 1 week ago. The problem occurs daily. The problem has not changed since onset.Associated symptoms include headaches. Associated symptoms comments: Dizziness, vomiting. She feels a throbbing pain throughout her head. She says she's had this headache since November 2. She had seen her neurologist, Melvyn Novas M.D., who given her intravenous dexamethasone without relief.. Nothing aggravates the symptoms. Nothing relieves the symptoms. Treatments tried: IV dexamethasone. The treatment provided no relief.    Past Medical History  Diagnosis Date  . Migraine   . Seizures   . Asthma     Past Surgical History  Procedure Date  . Abdominal hysterectomy   . Ceaserian     Family History  Problem Relation Age of Onset  . Hypertension Mother   . Heart failure Father     History  Substance Use Topics  . Smoking status: Never Smoker   . Smokeless tobacco: Not on file  . Alcohol Use: No    OB History    Grav Para Term Preterm Abortions TAB SAB Ect Mult Living                  Review of Systems  Constitutional: Negative for fever and chills.  Eyes: Positive for photophobia.  Respiratory: Negative.   Cardiovascular: Negative.   Gastrointestinal: Positive for nausea and vomiting. Negative for diarrhea.  Genitourinary: Negative.   Skin: Negative.   Neurological: Positive for dizziness and headaches.  Psychiatric/Behavioral: Negative.     Allergies  Amoxicillin; Biaxin;  Erythromycin base; Penicillins; and Zithromax  Home Medications   Current Outpatient Rx  Name  Route  Sig  Dispense  Refill  . ACETAMINOPHEN 325 MG PO TABS   Oral   Take 650 mg by mouth every 6 (six) hours as needed. For fever         . ALBUTEROL SULFATE HFA 108 (90 BASE) MCG/ACT IN AERS   Inhalation   Inhale 2 puffs into the lungs every 4 (four) hours as needed. For asthma          . HYDROCOD POLST-CPM POLST ER 10-8 MG/5ML PO LQCR   Oral   Take 5 mLs by mouth every 12 (twelve) hours as needed.   75 mL   0   . CLONAZEPAM 1 MG PO TABS   Oral   Take 1 mg by mouth daily as needed. For onset of seizure and sleep         . ESTRADIOL 0.5 MG/0.5GM TD GEL   Transdermal   Place 1 application onto the skin daily.          Marland Kitchen HYDROCODONE-ACETAMINOPHEN 5-325 MG PO TABS   Oral   Take 1 tablet by mouth every 4 (four) hours as needed for pain.   6 tablet   0   . LAMOTRIGINE 100 MG PO TABS   Oral   Take 100 mg by mouth 2 (two) times daily.          Marland Kitchen NAPROXEN 500 MG PO TABS   Oral   Take 1  tablet (500 mg total) by mouth 2 (two) times daily with a meal.   30 tablet   0   . SUMATRIPTAN SUCCINATE 100 MG PO TABS   Oral   Take 100 mg by mouth every 2 (two) hours as needed. For migraine max 2 times per 24 hours         . TOPIRAMATE 100 MG PO TABS   Oral   Take 100 mg by mouth 2 (two) times daily.            BP 133/70  Pulse 87  Temp 98 F (36.7 C)  Resp 20  SpO2 99%  Physical Exam  Nursing note and vitals reviewed. Constitutional: She is oriented to person, place, and time.       Morbidly obese middle aged woman, resting in darkened room.  HENT:  Head: Normocephalic and atraumatic.  Right Ear: External ear normal.  Left Ear: External ear normal.  Mouth/Throat: Oropharynx is clear and moist.  Eyes: Conjunctivae normal and EOM are normal. Pupils are equal, round, and reactive to light.  Neck: Normal range of motion. Neck supple.  Cardiovascular: Normal  rate, regular rhythm and normal heart sounds.   Pulmonary/Chest: Effort normal and breath sounds normal.  Abdominal: Soft. Bowel sounds are normal.  Musculoskeletal: Normal range of motion. She exhibits no edema and no tenderness.  Lymphadenopathy:    She has no cervical adenopathy.  Neurological: She is alert and oriented to person, place, and time.       No sensory or motor deficit.  Skin: Skin is warm and dry.  Psychiatric: She has a normal mood and affect. Her behavior is normal.    ED Course  Procedures (including critical care time)  Pt was medicated with Reglan, dexamethasone, Benadryl and dilaudid.  After an hour she says her pain is unabated.  Will give rescue medication of Dilaudid 1 mg IV, then will release with hydrocodone-acetaminophen for pain, zofran for nausea, followup with Dr. Vickey Huger, her neurologist.    1. Migraine headache           Carleene Cooper III, MD 07/12/12 1529

## 2012-07-12 NOTE — ED Notes (Signed)
Pt presents to ED today with continued c/o migraine HA.  Pt is seen by neuro and given decadron in the office.  Pt states did not help relieve sx

## 2012-07-14 ENCOUNTER — Encounter (HOSPITAL_COMMUNITY): Payer: Self-pay | Admitting: Emergency Medicine

## 2012-07-14 ENCOUNTER — Emergency Department (HOSPITAL_COMMUNITY)
Admission: EM | Admit: 2012-07-14 | Discharge: 2012-07-14 | Disposition: A | Discharge status: Home / Self Care | Payer: BC Managed Care – PPO | Attending: Emergency Medicine | Admitting: Emergency Medicine

## 2012-07-14 DIAGNOSIS — H53149 Visual discomfort, unspecified: Secondary | ICD-10-CM | POA: Insufficient documentation

## 2012-07-14 DIAGNOSIS — G40909 Epilepsy, unspecified, not intractable, without status epilepticus: Secondary | ICD-10-CM | POA: Insufficient documentation

## 2012-07-14 DIAGNOSIS — J45909 Unspecified asthma, uncomplicated: Secondary | ICD-10-CM | POA: Insufficient documentation

## 2012-07-14 DIAGNOSIS — R112 Nausea with vomiting, unspecified: Secondary | ICD-10-CM | POA: Insufficient documentation

## 2012-07-14 DIAGNOSIS — Z79899 Other long term (current) drug therapy: Secondary | ICD-10-CM | POA: Insufficient documentation

## 2012-07-14 DIAGNOSIS — R51 Headache: Secondary | ICD-10-CM | POA: Insufficient documentation

## 2012-07-14 DIAGNOSIS — G43909 Migraine, unspecified, not intractable, without status migrainosus: Secondary | ICD-10-CM

## 2012-07-14 MED ORDER — METOCLOPRAMIDE HCL 5 MG/ML IJ SOLN
10.0000 mg | Freq: Once | INTRAMUSCULAR | Status: AC
  Administered 2012-07-14: 10 mg via INTRAVENOUS

## 2012-07-14 MED ORDER — DIPHENHYDRAMINE HCL 50 MG/ML IJ SOLN
25.0000 mg | Freq: Once | INTRAMUSCULAR | Status: AC
  Administered 2012-07-14: 25 mg via INTRAVENOUS
  Filled 2012-07-14: qty 1

## 2012-07-14 MED ORDER — KETOROLAC TROMETHAMINE 30 MG/ML IJ SOLN
30.0000 mg | Freq: Once | INTRAMUSCULAR | Status: AC
  Administered 2012-07-14: 30 mg via INTRAVENOUS
  Filled 2012-07-14: qty 1

## 2012-07-14 MED ORDER — LORAZEPAM 2 MG/ML IJ SOLN
1.0000 mg | Freq: Once | INTRAMUSCULAR | Status: DC
  Filled 2012-07-14: qty 1

## 2012-07-14 MED ORDER — LORAZEPAM 2 MG/ML IJ SOLN
1.0000 mg | Freq: Once | INTRAMUSCULAR | Status: AC
  Administered 2012-07-14: 1 mg via INTRAVENOUS

## 2012-07-14 MED ORDER — METOCLOPRAMIDE HCL 5 MG/ML IJ SOLN
10.0000 mg | Freq: Once | INTRAMUSCULAR | Status: DC
  Filled 2012-07-14: qty 2

## 2012-07-14 MED ORDER — MAGNESIUM SULFATE 40 MG/ML IJ SOLN
2.0000 g | Freq: Once | INTRAMUSCULAR | Status: AC
  Administered 2012-07-14: 2 g via INTRAVENOUS
  Filled 2012-07-14: qty 50

## 2012-07-14 MED ORDER — SODIUM CHLORIDE 0.9 % IV SOLN
INTRAVENOUS | Status: DC
  Administered 2012-07-14: 13:00:00 via INTRAVENOUS

## 2012-07-14 NOTE — ED Notes (Signed)
Pt presenting to ed with c/o migraine headache x 16 days. Pt states she saw her neurologist x 4 days ago and she received IV medications that did not help. Pt states she went to [facility] 2 days ago for the same and it did not help her either. Pt states she is dizzy and nauseated. Pt states she is sensitive to light and noise.

## 2012-07-14 NOTE — ED Provider Notes (Signed)
History     CSN: [id number]  Arrival date & time 07/14/12  1125   First MD Initiated Contact with Patient 07/14/12 1222      Chief Complaint  Patient presents with  . Migraine    (Consider location/radiation/quality/duration/timing/severity/associated sxs/prior treatment) HPI Comments: 46 year old female with a history of migraines, seizures, and asthma presents emergency department with chief complaint of migraine.  Onset of current episode began 16 days ago and patient reports that she is been treated multiple times without relief.  Her neurologist attempted in with IV and Depakote, Toradol, Dilaudid, and Reglan with only minimal relief.  [facility].  Associated symptoms include photophobia, nausea, and sensitivity to noise.  Patient denies any recent head trauma and states that it is like her typical migraine however it is lasting longer than usual.  Patient is a 46 y.o. female presenting with migraines. The history is provided by the patient.  Migraine This is a chronic problem. Episode onset: onset 16 days ago. The problem occurs constantly. The problem has been unchanged. Associated symptoms include headaches, nausea and vomiting. Pertinent negatives include no abdominal pain, chest pain, chills, congestion, coughing, diaphoresis, fatigue, fever, myalgias, neck pain, numbness, rash or weakness.    Past Medical History  Diagnosis Date  . Migraine   . Seizures   . Asthma     Past Surgical History  Procedure Date  . Abdominal hysterectomy   . Ceaserian     Family History  Problem Relation Age of Onset  . Hypertension Mother   . Heart failure Father     History  Substance Use Topics  . Smoking status: Never Smoker   . Smokeless tobacco: Not on file  . Alcohol Use: No    OB History    Grav Para Term Preterm Abortions TAB SAB Ect Mult Living                  Review of Systems  Constitutional: Positive for activity  change. Negative for fever, chills, diaphoresis and fatigue.  HENT: Negative for ear pain, congestion, facial swelling, neck pain, neck stiffness, sinus pressure and tinnitus.   Eyes: Positive for photophobia. Negative for redness and visual disturbance.  Respiratory: Negative for cough, shortness of breath, wheezing and stridor.   Cardiovascular: Negative for chest pain.  Gastrointestinal: Positive for nausea and vomiting. Negative for abdominal pain.  Musculoskeletal: Negative for myalgias and gait problem.  Skin: Negative for rash.  Neurological: Positive for headaches. Negative for dizziness, syncope, speech difficulty, weakness, light-headedness and numbness.       No bowel or bladder incontinence.  Psychiatric/Behavioral: Negative for confusion.  All other systems reviewed and are negative.    Allergies  Amoxicillin; Biaxin; Erythromycin base; Penicillins; and Zithromax  Home Medications   Current Outpatient Rx  Name  Route  Sig  Dispense  Refill  . CLONAZEPAM 1 MG PO TABS   Oral   Take 1 mg by mouth daily as needed. For onset of seizure and sleep         . ELETRIPTAN HYDROBROMIDE 40 MG PO TABS   Oral   One tablet by mouth at onset of headache. May repeat in 2 hours if headache persists or recurs. may repeat in 2 hours if necessary         . ESTRADIOL 2 MG VA RING   Vaginal   Place 2 mg vaginally every 3 (three) months. follow package directions         .  LAMOTRIGINE 100 MG PO TABS   Oral   Take 100 mg by mouth 2 (two) times daily.          . ALBUTEROL SULFATE HFA 108 (90 BASE) MCG/ACT IN AERS   Inhalation   Inhale 2 puffs into the lungs every 4 (four) hours as needed. For asthma          . ONDANSETRON HCL 4 MG PO TABS   Oral   Take 1 tablet (4 mg total) by mouth every 6 (six) hours.   12 tablet   0   . SUMATRIPTAN SUCCINATE 100 MG PO TABS   Oral   Take 100 mg by mouth every 2 (two) hours as needed. For migraine max 2 times per 24 hours            BP 98/48  Pulse 63  Temp 98.3 F (36.8 C) (Oral)  Resp 16  SpO2 96%  Physical Exam  Nursing note and vitals reviewed. Constitutional: She is oriented to person, place, and time. She appears well-developed and well-nourished. She appears distressed.  HENT:  Head: Normocephalic and atraumatic.  Eyes: Conjunctivae normal and EOM are normal. Pupils are equal, round, and reactive to light. No scleral icterus.  Neck: Normal range of motion.       Neck supple with no nuchal rigidity, full pain free ROM. No carotid bruit  Cardiovascular: Normal rate, regular rhythm, normal heart sounds and intact distal pulses.   Pulmonary/Chest: Effort normal and breath sounds normal. No respiratory distress.  Musculoskeletal: Normal range of motion.  Neurological: She is alert and oriented to person, place, and time. She has normal strength.       CN III-XII intact, good coordination, normal gait, strength 5/5 bilaterally, intact distal sensation.  Skin: Skin is warm and dry.       No rash, non diaphoretic    ED Course  Procedures (including critical care time)  Labs Reviewed - No data to display No results found.   No diagnosis found.   Medications  estradiol (ESTRING) 2 MG vaginal ring (not administered)  eletriptan (RELPAX) 40 MG tablet (not administered)  0.9 %  sodium chloride infusion (  Intravenous New Bag/Given 07/14/12 1253)  magnesium sulfate IVPB 2 g 50 mL (2 g Intravenous New Bag/Given 07/14/12 1321)  diphenhydrAMINE (BENADRYL) injection 25 mg (25 mg Intravenous Given 07/14/12 1255)  ketorolac (TORADOL) 30 MG/ML injection 30 mg (30 mg Intravenous Given 07/14/12 1255)  metoCLOPramide (REGLAN) injection 10 mg (10 mg Intravenous Given 07/14/12 1255)  LORazepam (ATIVAN) injection 1 mg (1 mg Intravenous Given 07/14/12 1255)     MDM  Pt HA treated and improved while in ED.  Presentation is like pts typical HA and non concerning for [facility], [facility], Meningitis, or temporal arteritis. Pt  is afebrile with no focal neuro deficits, nuchal rigidity, or change in vision. Pt is to follow up with PCP to discuss prophylactic medication. Pt verbalizes understanding and is agreeable with plan to dc.          Jaci Carrel, [place] 07/14/12 1355

## 2012-07-14 NOTE — ED Provider Notes (Signed)
History/physical exam/procedure(s) were performed by non-physician practitioner and as supervising physician I was immediately available for consultation/collaboration. I have reviewed all notes and am in agreement with care and plan.   Hilario Quarry, MD 07/14/12 [phone]

## 2012-08-07 ENCOUNTER — Emergency Department (HOSPITAL_BASED_OUTPATIENT_CLINIC_OR_DEPARTMENT_OTHER): Payer: BC Managed Care – PPO

## 2012-08-07 ENCOUNTER — Emergency Department (HOSPITAL_BASED_OUTPATIENT_CLINIC_OR_DEPARTMENT_OTHER)
Admission: EM | Admit: 2012-08-07 | Discharge: 2012-08-07 | Disposition: A | Discharge status: Home / Self Care | Payer: BC Managed Care – PPO | Attending: Emergency Medicine | Admitting: Emergency Medicine

## 2012-08-07 ENCOUNTER — Encounter (HOSPITAL_BASED_OUTPATIENT_CLINIC_OR_DEPARTMENT_OTHER): Payer: Self-pay

## 2012-08-07 DIAGNOSIS — G43809 Other migraine, not intractable, without status migrainosus: Secondary | ICD-10-CM

## 2012-08-07 DIAGNOSIS — G40909 Epilepsy, unspecified, not intractable, without status epilepticus: Secondary | ICD-10-CM | POA: Insufficient documentation

## 2012-08-07 DIAGNOSIS — J45909 Unspecified asthma, uncomplicated: Secondary | ICD-10-CM | POA: Insufficient documentation

## 2012-08-07 DIAGNOSIS — R112 Nausea with vomiting, unspecified: Secondary | ICD-10-CM | POA: Insufficient documentation

## 2012-08-07 DIAGNOSIS — H53149 Visual discomfort, unspecified: Secondary | ICD-10-CM | POA: Insufficient documentation

## 2012-08-07 DIAGNOSIS — Z79899 Other long term (current) drug therapy: Secondary | ICD-10-CM | POA: Insufficient documentation

## 2012-08-07 DIAGNOSIS — R209 Unspecified disturbances of skin sensation: Secondary | ICD-10-CM | POA: Insufficient documentation

## 2012-08-07 MED ORDER — KETOROLAC TROMETHAMINE 15 MG/ML IJ SOLN
15.0000 mg | Freq: Once | INTRAMUSCULAR | Status: AC
  Administered 2012-08-07: 15 mg via INTRAVENOUS
  Filled 2012-08-07: qty 1

## 2012-08-07 MED ORDER — DIPHENHYDRAMINE HCL 50 MG/ML IJ SOLN
25.0000 mg | Freq: Once | INTRAMUSCULAR | Status: AC
  Administered 2012-08-07: 25 mg via INTRAVENOUS
  Filled 2012-08-07: qty 1

## 2012-08-07 MED ORDER — MAGNESIUM SULFATE 50 % IJ SOLN
2.0000 g | Freq: Once | INTRAMUSCULAR | Status: AC
  Administered 2012-08-07: 2 g via INTRAVENOUS
  Filled 2012-08-07 (×2): qty 2

## 2012-08-07 MED ORDER — METOCLOPRAMIDE HCL 5 MG/ML IJ SOLN
10.0000 mg | Freq: Once | INTRAMUSCULAR | Status: AC
  Administered 2012-08-07: 10 mg via INTRAVENOUS
  Filled 2012-08-07: qty 2
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
## 2012-10-17 ENCOUNTER — Other Ambulatory Visit (HOSPITAL_COMMUNITY): Payer: Self-pay | Admitting: Obstetrics & Gynecology

## 2012-10-17 DIAGNOSIS — R102 Pelvic and perineal pain: Secondary | ICD-10-CM

## 2012-10-22 ENCOUNTER — Ambulatory Visit (HOSPITAL_COMMUNITY)
Admission: RE | Admit: 2012-10-22 | Discharge: 2012-10-22 | Disposition: A | Discharge status: Home / Self Care | Payer: BC Managed Care – PPO | Source: Ambulatory Visit | Attending: Obstetrics & Gynecology | Admitting: Obstetrics & Gynecology

## 2012-10-22 ENCOUNTER — Encounter (HOSPITAL_COMMUNITY): Payer: Self-pay

## 2012-10-22 DIAGNOSIS — N949 Unspecified condition associated with female genital organs and menstrual cycle: Secondary | ICD-10-CM | POA: Insufficient documentation

## 2012-10-22 DIAGNOSIS — Z90710 Acquired absence of both cervix and uterus: Secondary | ICD-10-CM | POA: Insufficient documentation

## 2012-10-22 DIAGNOSIS — R102 Pelvic and perineal pain: Secondary | ICD-10-CM

## 2012-10-22 DIAGNOSIS — Q619 Cystic kidney disease, unspecified: Secondary | ICD-10-CM | POA: Insufficient documentation

## 2012-10-22 DIAGNOSIS — K7689 Other specified diseases of liver: Secondary | ICD-10-CM | POA: Insufficient documentation

## 2012-10-22 DIAGNOSIS — R197 Diarrhea, unspecified: Secondary | ICD-10-CM | POA: Insufficient documentation

## 2012-10-22 DIAGNOSIS — R110 Nausea: Secondary | ICD-10-CM | POA: Insufficient documentation

## 2012-10-22 DIAGNOSIS — R1032 Left lower quadrant pain: Secondary | ICD-10-CM | POA: Insufficient documentation

## 2012-10-22 MED ORDER — IOHEXOL 300 MG/ML  SOLN
100.0000 mL | Freq: Once | INTRAMUSCULAR | Status: AC | PRN
  Administered 2012-10-22: 100 mL via INTRAVENOUS

## 2012-11-03 ENCOUNTER — Encounter: Payer: Self-pay | Admitting: Gastroenterology

## 2012-11-04 ENCOUNTER — Encounter (INDEPENDENT_AMBULATORY_CARE_PROVIDER_SITE_OTHER): Payer: Self-pay | Admitting: Surgery

## 2012-11-06 ENCOUNTER — Ambulatory Visit (INDEPENDENT_AMBULATORY_CARE_PROVIDER_SITE_OTHER): Payer: BC Managed Care – PPO | Admitting: Surgery

## 2012-11-06 ENCOUNTER — Encounter (INDEPENDENT_AMBULATORY_CARE_PROVIDER_SITE_OTHER): Payer: Self-pay | Admitting: Surgery

## 2012-11-06 VITALS — BP 118/78 | HR 64 | Temp 97.1°F | Resp 12 | Ht 62.5 in | Wt 274.0 lb

## 2012-11-06 DIAGNOSIS — R109 Unspecified abdominal pain: Secondary | ICD-10-CM

## 2012-11-06 DIAGNOSIS — K432 Incisional hernia without obstruction or gangrene: Secondary | ICD-10-CM

## 2012-11-06 MED ORDER — ONDANSETRON HCL 4 MG PO TABS: 4.0000 mg | ORAL_TABLET | Freq: Three times a day (TID) | ORAL | Status: DC | PRN

## 2012-11-06 NOTE — Progress Notes (Signed)
Subjective:     Patient ID: [name] [name], female   DOB: [birth year], 47 y.o.   MRN: [id number]  HPI This is a pleasant female referred by Dr. Clearance Coots for valuation of abdominal pain. She's been having pain for more than a year there passer weeks it has been worsening. The pain is mostly periumbilical and left lower quadrant. She has diarrhea and reports she has nausea and vomiting almost daily. A CAT scan of the abdomen and pelvis was performed which did show a tiny hernia containing only fat but no other abnormalities. There was no evidence of obstruction. She has had a previous laparoscopic converted to open hysterectomy and that laparoscopic removal of an ovarian cyst  Review of Systems     Objective:   Physical Exam On exam, she is morbidly obese. She is otherwise well in appearance. Her abdomen is tender in the left lower quadrant with guarding but less so when distracted. I cannot palpate a hernia defect in her abdomen.  I reviewed the CAT scan of the abdomen and pelvis. The tiny hernia is at the upper trocar incision from her laparoscopic hysterectomy. Again, it contained only fat with no evidence of bowel involvement and no evidence of obstruction    Assessment:     Abdominal pain of uncertain etiology     Plan:     I doubt her symptoms are secondary to this tiny hernia as per my review of the CAT scan. She'll be seeing a gastroenterologist. I encouraged her to start Zantac and all right her for Zofran. I will see her back after she is seen by the gastroenterologist and may consider a diagnostic laparoscopy as I believe that repairing this hernia will not solve her abdominal pain.

## 2012-11-18 ENCOUNTER — Ambulatory Visit (INDEPENDENT_AMBULATORY_CARE_PROVIDER_SITE_OTHER): Payer: BC Managed Care – PPO | Admitting: Gastroenterology

## 2012-11-18 ENCOUNTER — Telehealth: Payer: Self-pay | Admitting: *Deleted

## 2012-11-18 ENCOUNTER — Inpatient Hospital Stay (HOSPITAL_COMMUNITY): Payer: BC Managed Care – PPO

## 2012-11-18 ENCOUNTER — Encounter (HOSPITAL_COMMUNITY): Payer: Self-pay

## 2012-11-18 ENCOUNTER — Inpatient Hospital Stay (HOSPITAL_COMMUNITY)
Admission: AD | Admit: 2012-11-18 | Discharge: 2012-11-21 | DRG: 464 | Disposition: A | Discharge status: Home / Self Care | Payer: BC Managed Care – PPO | Source: Ambulatory Visit | Attending: Gastroenterology | Admitting: Gastroenterology

## 2012-11-18 ENCOUNTER — Encounter: Payer: Self-pay | Admitting: Gastroenterology

## 2012-11-18 ENCOUNTER — Telehealth (INDEPENDENT_AMBULATORY_CARE_PROVIDER_SITE_OTHER): Payer: Self-pay | Admitting: General Surgery

## 2012-11-18 VITALS — BP 126/84 | HR 85 | Ht 63.0 in | Wt 273.2 lb

## 2012-11-18 DIAGNOSIS — R1032 Left lower quadrant pain: Secondary | ICD-10-CM

## 2012-11-18 DIAGNOSIS — R109 Unspecified abdominal pain: Secondary | ICD-10-CM | POA: Diagnosis present

## 2012-11-18 DIAGNOSIS — K5900 Constipation, unspecified: Secondary | ICD-10-CM | POA: Diagnosis present

## 2012-11-18 DIAGNOSIS — Z90710 Acquired absence of both cervix and uterus: Secondary | ICD-10-CM

## 2012-11-18 DIAGNOSIS — K439 Ventral hernia without obstruction or gangrene: Secondary | ICD-10-CM | POA: Diagnosis present

## 2012-11-18 DIAGNOSIS — Z79899 Other long term (current) drug therapy: Secondary | ICD-10-CM

## 2012-11-18 DIAGNOSIS — R112 Nausea with vomiting, unspecified: Secondary | ICD-10-CM

## 2012-11-18 DIAGNOSIS — Z880 Allergy status to penicillin: Secondary | ICD-10-CM

## 2012-11-18 DIAGNOSIS — G8929 Other chronic pain: Principal | ICD-10-CM | POA: Diagnosis present

## 2012-11-18 DIAGNOSIS — R51 Headache: Secondary | ICD-10-CM

## 2012-11-18 DIAGNOSIS — K432 Incisional hernia without obstruction or gangrene: Secondary | ICD-10-CM

## 2012-11-18 DIAGNOSIS — R1084 Generalized abdominal pain: Secondary | ICD-10-CM

## 2012-11-18 DIAGNOSIS — J45909 Unspecified asthma, uncomplicated: Secondary | ICD-10-CM | POA: Diagnosis present

## 2012-11-18 DIAGNOSIS — Z881 Allergy status to other antibiotic agents status: Secondary | ICD-10-CM

## 2012-11-18 DIAGNOSIS — E669 Obesity, unspecified: Secondary | ICD-10-CM | POA: Diagnosis present

## 2012-11-18 LAB — HEPATIC FUNCTION PANEL
ALT: 19 U/L (ref 0–35)
Alkaline Phosphatase: 111 U/L (ref 39–117)
Bilirubin, Direct: <0.1 mg/dL (ref 0.0–0.3)

## 2012-11-18 LAB — CBC
Hemoglobin: 14.1 g/dL (ref 12.0–15.0)
MCH: 27.1 pg (ref 26.0–34.0)
Platelets: 190 10*3/uL (ref 150–400)
RDW: 14.6 % (ref 11.5–15.5)

## 2012-11-18 LAB — BASIC METABOLIC PANEL
CO2: 24 mEq/L (ref 19–32)
Chloride: 107 mEq/L (ref 96–112)
Creatinine, Ser: 0.81 mg/dL (ref 0.50–1.10)
GFR calc Af Amer: >90 mL/min (ref >90)
Potassium: 3.7 mEq/L (ref 3.5–5.1)

## 2012-11-18 LAB — AMYLASE: Amylase: 54 U/L (ref 0–105)

## 2012-11-18 MED ORDER — HYDROMORPHONE HCL PF 1 MG/ML IJ SOLN
1.0000 mg | INTRAMUSCULAR | Status: DC | PRN
  Administered 2012-11-18 – 2012-11-21 (×14): 1 mg via INTRAVENOUS
  Filled 2012-11-18 (×14): qty 1

## 2012-11-18 MED ORDER — ONDANSETRON HCL 4 MG/2ML IJ SOLN
4.0000 mg | Freq: Four times a day (QID) | INTRAMUSCULAR | Status: DC | PRN
  Administered 2012-11-18 – 2012-11-21 (×9): 4 mg via INTRAVENOUS
  Filled 2012-11-18 (×10): qty 2

## 2012-11-18 MED ORDER — ALBUTEROL SULFATE HFA 108 (90 BASE) MCG/ACT IN AERS
2.0000 | INHALATION_SPRAY | RESPIRATORY_TRACT | Status: DC | PRN
  Administered 2012-11-20: 2 via RESPIRATORY_TRACT
  Filled 2012-11-18: qty 6.7

## 2012-11-18 MED ORDER — TOPIRAMATE 100 MG PO TABS
100.0000 mg | ORAL_TABLET | Freq: Two times a day (BID) | ORAL | Status: DC
  Administered 2012-11-18 – 2012-11-20 (×5): 100 mg via ORAL
  Filled 2012-11-18 (×7): qty 1

## 2012-11-18 MED ORDER — IOHEXOL 300 MG/ML  SOLN
25.0000 mL | INTRAMUSCULAR | Status: AC
  Administered 2012-11-18 (×2): 25 mL via ORAL

## 2012-11-18 MED ORDER — LAMOTRIGINE 100 MG PO TABS
100.0000 mg | ORAL_TABLET | Freq: Two times a day (BID) | ORAL | Status: DC
  Administered 2012-11-18 – 2012-11-20 (×5): 100 mg via ORAL
  Filled 2012-11-18 (×7): qty 1

## 2012-11-18 MED ORDER — CLONAZEPAM 1 MG PO TABS: 1.0000 mg | ORAL_TABLET | Freq: Every day | ORAL | Status: DC | PRN

## 2012-11-18 MED ORDER — IOHEXOL 300 MG/ML  SOLN
100.0000 mL | Freq: Once | INTRAMUSCULAR | Status: AC | PRN
  Administered 2012-11-18: 100 mL via INTRAVENOUS

## 2012-11-18 MED ORDER — SODIUM CHLORIDE 0.9 % IV SOLN
INTRAVENOUS | Status: DC
  Administered 2012-11-18 – 2012-11-20 (×5): via INTRAVENOUS
  Administered 2012-11-21: 1000 mL via INTRAVENOUS
  Administered 2012-11-21 (×2): via INTRAVENOUS

## 2012-11-18 NOTE — Care Management (Signed)
CARE MANAGEMENT NOTE 11/18/2012  Patient:  EBONE, ALCIVAR   Account Number:  [id number]  Date Initiated:  11/18/2012  Documentation initiated by:  Fatema Rabe  Subjective/Objective Assessment:   47 yo female admitted with partial bowel obstruction. Hx s/p section total abdominal hysterectomy. PTA pt idependent.     Action/Plan:   Home when stable   Anticipated DC Date:     Anticipated DC Plan:  HOME/SELF CARE         Choice offered to / List presented to:             Status of service:  In process, will continue to follow Medicare Important Message given?   (If response is "NO", the following Medicare IM given date fields will be blank) Date Medicare IM given:   Date Additional Medicare IM given:    Discharge Disposition:    Per UR Regulation:  Reviewed for med. necessity/level of care/duration of stay  If discussed at Long Length of Stay Meetings, dates discussed:    Comments:  11/18/12 1446 Geroge Gilliam,RN,BSN [phone]

## 2012-11-18 NOTE — Patient Instructions (Addendum)
Please go to [facility], Out Patient registration on the first floor.  Your room is 1303, 3-east.

## 2012-11-18 NOTE — H&P (Signed)
Primary Care Physician:  Jerl Mina, MD Primary Gastroenterologist:   Sheryn Bison, MD  Dr. Norval Gable H&P from this morning's office visit:   History of Present Illness: This is a 47 year old African American female who is status post previous cesarean section total abdominal hysterectomy. She's had at least 8 months of worsening diffuse abdominal pain with initial diarrhea, and now with rather marked constipation, gas and bloating. She has intermittent nausea and vomiting cannot eat much food has had anorexia and weight loss but denies fever, chills, or any specific hepatobiliary complaints. She does have acid reflux and indigestion, but denies abuse of alcohol, cigarettes, or NSAIDs. Chest CT scan of the abdomen in February 28 which was unremarkable except for small abdominal wall hernia. Patient denies melena, hematochezia, or any history of hepatitis or pancreatitis. He said that she has a" low pain threshold, and then her pain has become intolerable.   I have reviewed this patient's present history, medical and surgical past history, allergies and medications.   ROS: All systems were reviewed and are negative unless otherwise stated in the HPI.   Allergies   Allergen  Reactions   .  Amoxicillin  Hives and Swelling   .  Biaxin (Clarithromycin)  Hives   .  Erythromycin Base  Hives   .  Penicillins  Hives and Swelling   .  Zithromax (Azithromycin)  Hives and Nausea Only    Outpatient Prescriptions Prior to Visit   Medication  Sig  Dispense  Refill   .  albuterol (PROVENTIL HFA;VENTOLIN HFA) 108 (90 BASE) MCG/ACT inhaler  Inhale 2 puffs into the lungs every 4 (four) hours as needed. For asthma     .  clonazePAM (KLONOPIN) 1 MG tablet  Take 1 mg by mouth daily as needed. For onset of seizure and sleep     .  estradiol (ESTRING) 2 MG vaginal ring  Place 2 mg vaginally every 3 (three) months. follow package directions     .  lamoTRIgine (LAMICTAL) 100 MG tablet  Take 100 mg by mouth 2  (two) times daily.     .  ondansetron (ZOFRAN) 4 MG tablet  Take 1 tablet (4 mg total) by mouth every 8 (eight) hours as needed for nausea.  20 tablet  0   .  SUMAtriptan (IMITREX) 100 MG tablet  Take 100 mg by mouth every 2 (two) hours as needed. For migraine max 2 times per 24 hours     .  topiramate (TOPAMAX) 100 MG tablet  Take 100 mg by mouth 2 (two) times daily.     Marland Kitchen  eletriptan (RELPAX) 40 MG tablet  One tablet by mouth at onset of headache. May repeat in 2 hours if headache persists or recurs. may repeat in 2 hours if necessary     .  ondansetron (ZOFRAN) 4 MG tablet  Take 1 tablet (4 mg total) by mouth every 6 (six) hours.  12 tablet  0    No facility-administered medications prior to visit.    Past Medical History   Diagnosis  Date   .  Migraine    .  Seizures    .  Asthma     Past Surgical History   Procedure  Laterality  Date   .  Abdominal hysterectomy     .  Cesarean section      History    Social History   .  Marital Status:  Single     Spouse Name:  N/A  Number of Children:  2   .  Years of Education:  N/A    Occupational History   .  administrative assistance     Social History Main Topics   .  Smoking status:  Never Smoker   .  Smokeless tobacco:  Never Used   .  Alcohol Use:  Yes      Comment: occ glass of wine   .  Drug Use:  No   .  Sexually Active:  Not Currently     Birth Control/ Protection:  Abstinence, Surgical    Other Topics  Concern   .  None    Social History Narrative   .  None    Family History   Problem  Relation  Age of Onset   .  Hypertension  Mother    .  Depression  Mother    .  Heart attack  Father    .  Hypertension  Father    .  Emphysema  Father    .  COPD  Father    .  Hypertension  Sister     Physical Exam: Blood pressure 126/84, pulse 85 and regular, and weight 273 with a BMI of 48.42. 98% room air saturation.  General well developed well nourished patient in no acute distress, appearing their stated age  Eyes  PERRLA, no icterus, fundoscopic exam per opthamologist  Skin no lesions noted  Neck supple, no adenopathy, no thyroid enlargement, no tenderness  Chest clear to percussion and auscultation  Heart no significant murmurs, gallops or rubs noted  Abdomen no hepatosplenomegaly masses or tenderness, BS normal. Obese abdomen making exam heart. There is however midline suprapubic scar which is very tender to palpation with probable herniation. Examination of the abdomen is otherwise unremarkable without rebound tenderness. Our sounds are present  Rectal inspection normal no fissures, or fistulae noted. No masses or tenderness on digital exam. Stool guaiac negative.  Extremities no acute joint lesions, edema, phlebitis or evidence of cellulitis.  Neurologic patient oriented x 3, cranial nerves intact, no focal neurologic deficits noted.  Psychological mental status normal and normal affect.   Assessment and plan: This patient has symptoms consistent with partial bowel obstruction, possibly incisional hernia versus other inflammatory process in her lower gut-pelvic area. I am admitting her for workup including CT scan, surgical consultation, amylase, lipase, and routine labs. She may need endoscopy and colonoscopy once her clinical course becomes clear. This patient has no previous history of gastrointestinal issues. She's very uncomfortable during the exam today, I do not think we can manage her as an outpatient.

## 2012-11-18 NOTE — Telephone Encounter (Signed)
Called patient and left two messages on cell phone voice mail for patient to call office back because patient has not made it to [facility] for admission.

## 2012-11-18 NOTE — Telephone Encounter (Signed)
Patient called in to let Dr. Magnus Ivan know that she saw Dr. Jarold Motto today like he requested.  She has been admitted to [facility] hospital for a possible bowel obstruction.  She will be undergoing another CT scan, labs, and some further testing if needed.  Informed her that I would pass this message along to Dr. Magnus Ivan.

## 2012-11-18 NOTE — Progress Notes (Signed)
History of Present Illness:  This is a 47 year old African American female who is status post previous cesarean section total abdominal hysterectomy.  She's had at least 8 months of worsening diffuse abdominal pain with initial diarrhea, and now with rather marked constipation, gas and bloating.  She has intermittent nausea and vomiting cannot eat much food has had anorexia and weight loss but denies fever, chills, or any specific hepatobiliary complaints.  She does have acid reflux and indigestion, but denies abuse of alcohol, cigarettes, or NSAIDs.  Chest CT scan of the abdomen in February 28 which was unremarkable except for small abdominal wall hernia.  Patient denies melena, hematochezia, or any history of hepatitis or pancreatitis.  He said that she has a" low pain threshold, and then her pain has become intolerable.  I have reviewed this patient's present history, medical and surgical past history, allergies and medications.     ROS:   All systems were reviewed and are negative unless otherwise stated in the HPI.  Allergies  Allergen Reactions  . Amoxicillin Hives and Swelling  . Biaxin (Clarithromycin) Hives  . Erythromycin Base Hives  . Penicillins Hives and Swelling  . Zithromax (Azithromycin) Hives and Nausea Only   Outpatient Prescriptions Prior to Visit  Medication Sig Dispense Refill  . albuterol (PROVENTIL HFA;VENTOLIN HFA) 108 (90 BASE) MCG/ACT inhaler Inhale 2 puffs into the lungs every 4 (four) hours as needed. For asthma       . clonazePAM (KLONOPIN) 1 MG tablet Take 1 mg by mouth daily as needed. For onset of seizure and sleep      . estradiol (ESTRING) 2 MG vaginal ring Place 2 mg vaginally every 3 (three) months. follow package directions      . lamoTRIgine (LAMICTAL) 100 MG tablet Take 100 mg by mouth 2 (two) times daily.       . ondansetron (ZOFRAN) 4 MG tablet Take 1 tablet (4 mg total) by mouth every 8 (eight) hours as needed for nausea.  20 tablet  0  . SUMAtriptan  (IMITREX) 100 MG tablet Take 100 mg by mouth every 2 (two) hours as needed. For migraine max 2 times per 24 hours      . topiramate (TOPAMAX) 100 MG tablet Take 100 mg by mouth 2 (two) times daily.      Marland Kitchen eletriptan (RELPAX) 40 MG tablet One tablet by mouth at onset of headache. May repeat in 2 hours if headache persists or recurs. may repeat in 2 hours if necessary      . ondansetron (ZOFRAN) 4 MG tablet Take 1 tablet (4 mg total) by mouth every 6 (six) hours.  12 tablet  0   No facility-administered medications prior to visit.   Past Medical History  Diagnosis Date  . Migraine   . Seizures   . Asthma    Past Surgical History  Procedure Laterality Date  . Abdominal hysterectomy    . Cesarean section     History   Social History  . Marital Status: Single    Spouse Name: N/A    Number of Children: 2  . Years of Education: N/A   Occupational History  . administrative assistance    Social History Main Topics  . Smoking status: Never Smoker   . Smokeless tobacco: Never Used  . Alcohol Use: Yes     Comment: occ glass of wine  . Drug Use: No  . Sexually Active: Not Currently    Birth Control/ Protection: Abstinence, Surgical  Other Topics Concern  . None   Social History Narrative  . None   Family History  Problem Relation Age of Onset  . Hypertension Mother   . Depression Mother   . Heart attack Father   . Hypertension Father   . Emphysema Father   . COPD Father   . Hypertension Sister        Physical Exam: Blood pressure 126/84, pulse 85 and regular, and weight 273 with a BMI of 48.42.  98% room air saturation. General well developed well nourished patient in no acute distress, appearing their stated age Eyes PERRLA, no icterus, fundoscopic exam per opthamologist Skin no lesions noted Neck supple, no adenopathy, no thyroid enlargement, no tenderness Chest clear to percussion and auscultation Heart no significant murmurs, gallops or rubs noted Abdomen no  hepatosplenomegaly masses or tenderness, BS normal.  Obese abdomen making exam heart.  There is however midline suprapubic scar which is very tender to palpation with probable herniation.  Examination of the abdomen is otherwise unremarkable without rebound tenderness.  Our sounds are present Rectal inspection normal no fissures, or fistulae noted.  No masses or tenderness on digital exam. Stool guaiac negative. Extremities no acute joint lesions, edema, phlebitis or evidence of cellulitis. Neurologic patient oriented x 3, cranial nerves intact, no focal neurologic deficits noted. Psychological mental status normal and normal affect.  Assessment and plan: This patient has symptoms consistent with partial bowel obstruction, possibly incisional hernia versus other inflammatory process in her lower got-pelvic area.  I am admitting her for workup including CT scan, surgical consultation, amylase, lipase, and routine labs.  She may need endoscopy and colonoscopy once her clinical course becomes clear.  This patient has no previous history of gastrointestinal issues.  She's very uncomfortable during the exam today, I do not think we can manage her as an outpatient.  No diagnosis found.

## 2012-11-19 DIAGNOSIS — K432 Incisional hernia without obstruction or gangrene: Secondary | ICD-10-CM

## 2012-11-19 DIAGNOSIS — R1084 Generalized abdominal pain: Secondary | ICD-10-CM

## 2012-11-19 MED ORDER — POLYETHYLENE GLYCOL 3350 17 G PO PACK
17.0000 g | PACK | Freq: Two times a day (BID) | ORAL | Status: DC
  Administered 2012-11-19 – 2012-11-20 (×4): 17 g via ORAL
  Filled 2012-11-19 (×6): qty 1

## 2012-11-19 NOTE — Progress Notes (Signed)
[facility] Progress Note  SUBJECTIVE: Terrible night, couldn't sleep because of abdominal pain. Vomited liquid diet last night. Has a headache today  OBJECTIVE:  Vital signs in last 24 hours: Temp:  [98.2 F (36.8 C)-98.6 F (37 C)] 98.2 F (36.8 C) (03/26 0517) Pulse Rate:  [78-87] 84 (03/26 0517) Resp:  [16-18] 16 (03/26 0517) BP: (108-133)/(59-73) 114/60 mmHg (03/26 0517) SpO2:  [94 %-100 %] 100 % (03/26 0517) Weight:  [267 lb 6.7 oz (121.3 kg)] 267 lb 6.7 oz (121.3 kg) (03/25 1325) Last BM Date: 11/10/12 General:    Pleasant [name] Heart:  Regular rate and rhythm Lungs: Respirations even and unlabored, lungs CTA bilaterally Abdomen:  Soft, nondistended, mild diffuse mid abdominal and diffuse lower abdominal tenderness.. Extremities:  Without edema. Neurologic:  Alert and oriented,  grossly normal neurologically. Psych:  Cooperative. Normal mood and affect.   Lab Results:  Recent Labs  11/18/12 1313  WBC 8.4  HGB 14.1  HCT 42.5  PLT 190   BMET  Recent Labs  11/18/12 1313  NA 140  K 3.7  CL 107  CO2 24  GLUCOSE 89  BUN 12  CREATININE 0.81  CALCIUM 9.2   LFT  Recent Labs  11/18/12 1313  PROT 8.1  ALBUMIN 3.9  AST 16  ALT 19  ALKPHOS 111  BILITOT 0.3  BILIDIR <0.1  IBILI NOT CALCULATED    Studies/Results: Ct Abdomen Pelvis W Contrast  11/18/2012  *RADIOLOGY REPORT*  Clinical Data: Diffuse abdominal pain, nausea, diarrhea  CT ABDOMEN AND PELVIS WITH CONTRAST  Technique:  Multidetector CT imaging of the abdomen and pelvis was performed following the standard protocol during bolus administration of intravenous contrast.  Contrast: OMNIPAQUE IOHEXOL 300 MG/ML  SOLN  Comparison: 10/22/2012  Findings: Sagittal images of the spine are unremarkable.  Lung bases are unremarkable.  There are streak artifacts from the patient's large body habitus. Again noted fatty infiltration of the liver.  No focal hepatic mass.  No calcified  gallstones are noted within gallbladder.  The pancreas, spleen and adrenals are unremarkable.  The kidneys are symmetrical in size and enhancement.  No hydronephrosis or hydroureter.  Delayed renal images shows bilateral renal symmetrical excretion.  Bilateral visualized proximal ureter is unremarkable.  No small bowel obstruction.  No ascites or free air.  No adenopathy.  A tiny abdominal wall hernia containing fat midline axial image 32 is noted without evidence of acute complication. There is a umbilical region ventral hernia containing fat without evidence of acute complication.  The patient is status post hysterectomy. Probable pessary within pelvis.No  pelvic ascites or adenopathy.  No inguinal adenopathy. There is no pericecal inflammation.  The terminal ileum is unremarkable.  Impression: 1.  Again noted fatty infiltration of the liver. 2.  No acute inflammatory process within abdomen or pelvis. 3.  Status post hysterectomy. 4.  Umbilical region ventral hernia containing fat without evidence of acute complication.   Original Report Authenticated By: Natasha Mead, M.D.      ASSESSMENT / PLAN:  1. Chronic abdominal pain, progressive over last 7 weeks and associated with nausea and vomiting. Intiallly associated with diarrhea but that changed to constipation. Two CTscans unrevealing except for tiny fat containing abdominal hernia. Labs unrevealing. Patient saw  Dr. Magnus Ivan (surgery) a few weeks ago for the pain. He didn't feels pain was related to the tiny hernia. Cause of patient's pain is really unclear. She hasn't had a BM in 10 days, will purge bowels  to see if that helps.   3.  Migraines headaches.     LOS: 1 day   Willette Cluster  11/19/2012, 10:14 AM   GI ATTENDING  Patient seen and examined. Laboratories and x-rays reviewed. Remote endoscopy report reviewed. Surgical consultation reviewed. Agree with assessment and plan as outlined above. There is no obvious cause for the patient's  chronic, 24-hour day, pain. It is quite vague and not affected by meals, activity, defecation, or other entities. Extensive workup has been negative. Physical exam is unremarkable, except for morbid obesity. She does have a history of migraine headaches. Her only active GI complaint is constipation. I agree with purging her bowel to see if this helps her discomfort. In addition, I would put her on empiric PPI for a few weeks with GI followup with Dr. Jarold Motto. If her pain persists that time he may wish to proceed with upper endoscopy and/or colonoscopy. Anticipate discharge in the morning.  Wilhemina Bonito. Eda Keys., M.D. [facility] Division of Gastroenterology

## 2012-11-20 ENCOUNTER — Ambulatory Visit: Payer: Self-pay | Admitting: Obstetrics & Gynecology

## 2012-11-20 ENCOUNTER — Ambulatory Visit: Payer: BC Managed Care – PPO | Admitting: Obstetrics & Gynecology

## 2012-11-20 DIAGNOSIS — R51 Headache: Secondary | ICD-10-CM

## 2012-11-20 MED ORDER — PEG-KCL-NACL-NASULF-NA ASC-C 100 G PO SOLR: 1.0000 | Freq: Once | ORAL | Status: DC

## 2012-11-20 MED ORDER — MAGNESIUM CITRATE PO SOLN
1.0000 | Freq: Once | ORAL | Status: AC
  Administered 2012-11-20: 1 via ORAL

## 2012-11-20 MED ORDER — PEG-KCL-NACL-NASULF-NA ASC-C 100 G PO SOLR
0.5000 | Freq: Once | ORAL | Status: AC
  Administered 2012-11-20: 50 g via ORAL
  Filled 2012-11-20: qty 1

## 2012-11-20 MED ORDER — PEG-KCL-NACL-NASULF-NA ASC-C 100 G PO SOLR
0.5000 | Freq: Once | ORAL | Status: AC
  Administered 2012-11-21: 50 g via ORAL

## 2012-11-20 NOTE — Care Management (Signed)
Cm spoke with patient concerning discharge planning. Pt states No HH or DME needs. Pt states having a large support system. Pt uses [facility] on [address]. Primary Care Physician: Jerl Mina, MD.    Leonie Green [phone]

## 2012-11-20 NOTE — Progress Notes (Signed)
[facility] Progress Note  SUBJECTIVE: still having abdominal pain. Minimal results with Miralax and two enemas.    OBJECTIVE:  Vital signs in last 24 hours: Temp:  [98.3 F (36.8 C)-98.6 F (37 C)] 98.3 F (36.8 C) (03/27 0637) Pulse Rate:  [54-60] 54 (03/27 0637) Resp:  [16] 16 (03/27 0637) BP: (112-117)/(64-68) 117/64 mmHg (03/27 0637) SpO2:  [98 %-99 %] 98 % (03/27 0637) Last BM Date: 11/19/12 (Small amount after Enema) General:    [name] Heart:  Regular rate and rhythm Abdomen:  Soft, nondistended, mild diffuse lower abdominal tenderenss. Normal bowel sounds. Extremities:  Without edema. Neurologic:  Alert and oriented,  grossly normal neurologically. Psych:  Cooperative.    Lab Results:  Recent Labs  11/18/12 1313  WBC 8.4  HGB 14.1  HCT 42.5  PLT 190   BMET  Recent Labs  11/18/12 1313  NA 140  K 3.7  CL 107  CO2 24  GLUCOSE 89  BUN 12  CREATININE 0.81  CALCIUM 9.2   LFT  Recent Labs  11/18/12 1313  PROT 8.1  ALBUMIN 3.9  AST 16  ALT 19  ALKPHOS 111  BILITOT 0.3  BILIDIR <0.1  IBILI NOT CALCULATED   Studies/Results: Ct Abdomen Pelvis W Contrast  11/18/2012  *RADIOLOGY REPORT*  Clinical Data: Diffuse abdominal pain, nausea, diarrhea  CT ABDOMEN AND PELVIS WITH CONTRAST  Technique:  Multidetector CT imaging of the abdomen and pelvis was performed following the standard protocol during bolus administration of intravenous contrast.  Contrast: 100mL OMNIPAQUE IOHEXOL 300 MG/ML  SOLN  Comparison: 10/22/2012  Findings: Sagittal images of the spine are unremarkable.  Lung bases are unremarkable.  There are streak artifacts from the patient's large body habitus. Again noted fatty infiltration of the liver.  No focal hepatic mass.  No calcified gallstones are noted within gallbladder.  The pancreas, spleen and adrenals are unremarkable.  The kidneys are symmetrical in size and enhancement.  No hydronephrosis or hydroureter.   Delayed renal images shows bilateral renal symmetrical excretion.  Bilateral visualized proximal ureter is unremarkable.  No small bowel obstruction.  No ascites or free air.  No adenopathy.  A tiny abdominal wall hernia containing fat midline axial image 32 is noted without evidence of acute complication. There is a umbilical region ventral hernia containing fat without evidence of acute complication.  The patient is status post hysterectomy. Probable pessary within pelvis.No  pelvic ascites or adenopathy.  No inguinal adenopathy. There is no pericecal inflammation.  The terminal ileum is unremarkable.  Impression: 1.  Again noted fatty infiltration of the liver. 2.  No acute inflammatory process within abdomen or pelvis. 3.  Status post hysterectomy. 4.  Umbilical region ventral hernia containing fat without evidence of acute complication.   Original Report Authenticated By: Liviu Pop, M.D.      ASSESSMENT / PLAN:  1. Abdominal pain, nausea. Still having diffuse mid to lower abdominal pain. Imaging studies negative. Will evaluate further with EGD /colonoscopy tomorrow. The risks, benefits, and alternatives to EGD and colonoscopy with possible biopsy and possible polypectomy were discussed with the patient and she consents to proceed.    2.  Constipation. Need to start bowel prep now as she will likely be difficult to clean out. Magnesium Citrate now then Mirlax prep this evening. Will d/c IV dilaudid, it is contributing to constipation. Try Vicodin Q6 hours prn.     LOS: 2 days   Paula Guenther  11/20/2012, 10:53 AM     GI ATTENDING  Patient personally seen and examined along with nurse practitioner. Agree with above. Patient continues to complain of vague "severe" abdominal pain. Also reports headache" just feels bad". She is very concerned as to cause for her abdominal complaints. Not interested in pursuing additional workup as outpatient. We will plan colonoscopy and upper endoscopy tomorrow.  Anticipate discharge thereafter, with appropriate followup, if no overwhelming findings.The nature of the procedure, as well as the risks, benefits, and alternatives were carefully and thoroughly reviewed with the patient. Ample time for discussion and questions allowed. The patient understood, was satisfied, and agreed to proceed.  John N. Perry, Jr., M.D. [facility] Division of Gastroenterology 

## 2012-11-21 ENCOUNTER — Encounter (HOSPITAL_COMMUNITY)
Admission: AD | Disposition: A | Discharge status: Home / Self Care | Payer: Self-pay | Source: Ambulatory Visit | Attending: Gastroenterology

## 2012-11-21 ENCOUNTER — Telehealth (INDEPENDENT_AMBULATORY_CARE_PROVIDER_SITE_OTHER): Payer: Self-pay

## 2012-11-21 ENCOUNTER — Encounter (HOSPITAL_COMMUNITY): Payer: Self-pay | Admitting: *Deleted

## 2012-11-21 HISTORY — PX: ESOPHAGOGASTRODUODENOSCOPY: SHX5428

## 2012-11-21 HISTORY — PX: COLONOSCOPY: SHX5424

## 2012-11-21 SURGERY — EGD (ESOPHAGOGASTRODUODENOSCOPY)
Anesthesia: Moderate Sedation

## 2012-11-21 MED ORDER — FENTANYL CITRATE 0.05 MG/ML IJ SOLN
INTRAMUSCULAR | Status: DC | PRN
  Administered 2012-11-21 (×4): 25 ug via INTRAVENOUS

## 2012-11-21 MED ORDER — FENTANYL CITRATE 0.05 MG/ML IJ SOLN
INTRAMUSCULAR | Status: AC
  Filled 2012-11-21: qty 4

## 2012-11-21 MED ORDER — OMEPRAZOLE 20 MG PO CPDR: 20.0000 mg | DELAYED_RELEASE_CAPSULE | Freq: Every day | ORAL | Status: DC

## 2012-11-21 MED ORDER — BUTAMBEN-TETRACAINE-BENZOCAINE 2-2-14 % EX AERO
INHALATION_SPRAY | CUTANEOUS | Status: DC | PRN
  Administered 2012-11-21: 1 via TOPICAL

## 2012-11-21 MED ORDER — SODIUM CHLORIDE 0.45 % IV SOLN: INTRAVENOUS | Status: DC

## 2012-11-21 MED ORDER — DIPHENHYDRAMINE HCL 50 MG/ML IJ SOLN
INTRAMUSCULAR | Status: AC
  Filled 2012-11-21: qty 1

## 2012-11-21 MED ORDER — DIPHENHYDRAMINE HCL 50 MG/ML IJ SOLN
INTRAMUSCULAR | Status: DC | PRN
  Administered 2012-11-21: 25 mg via INTRAVENOUS

## 2012-11-21 MED ORDER — MIDAZOLAM HCL 10 MG/2ML IJ SOLN
INTRAMUSCULAR | Status: AC
  Filled 2012-11-21: qty 4

## 2012-11-21 MED ORDER — MIDAZOLAM HCL 10 MG/2ML IJ SOLN
INTRAMUSCULAR | Status: DC | PRN
  Administered 2012-11-21 (×5): 2 mg via INTRAVENOUS

## 2012-11-21 NOTE — Op Note (Signed)
[facility] [address] [place], [address]   COLONOSCOPY PROCEDURE REPORT  PATIENT: [name] [name], [name] [name]  MR#: [id number] BIRTHDATE: 1966-01-10 , 46  yrs. old GENDER: Female ENDOSCOPIST: Roxy Cedar, MD REFERRED QM:VHQIO R Patterson, M.D. PROCEDURE DATE:  11/21/2012 PROCEDURE:   Colonoscopy, diagnostic ASA CLASS:   Class II INDICATIONS:Average risk patient for colon cancer. MEDICATIONS: Fentanyl 75 mcg IV, Versed 8 mg IV, and Benadryl mg IV   DESCRIPTION OF PROCEDURE:   After the risks benefits and alternatives of the procedure were thoroughly explained, informed consent was obtained.  A digital rectal exam revealed no abnormalities of the rectum.   The Pentax Ped Colon [id number] endoscope was introduced through the anus and advanced to the cecum, which was identified by both the appendix and ileocecal valve. No adverse events experienced.   The quality of the prep was excellent, using MoviPrep  The instrument was then slowly withdrawn as the colon was fully examined.      COLON FINDINGS: A normal appearing cecum, ileocecal valve, and appendiceal orifice were identified.  The ascending, hepatic flexure, transverse, splenic flexure, descending, sigmoid colon and rectum appeared unremarkable.  No polyps or cancers were seen. Retroflexed views revealed internal hemorrhoids. The time to cecum=3 minutes 0 seconds.  Withdrawal time=7 minutes 0 seconds. The scope was withdrawn and the procedure completed. COMPLICATIONS: There were no complications.  ENDOSCOPIC IMPRESSION: Normal colon  RECOMMENDATIONS: 1.  Continue current colorectal screening recommendations for "routine risk" patients with a repeat colonoscopy in 10 years. 2.  Upper endoscopy today (SEE REPORT)   eSigned:  Roxy Cedar, MD 11/21/2012 8:57 AM   cc: The Patient    ; Vania Rea.Jarold Motto, MD

## 2012-11-21 NOTE — Telephone Encounter (Signed)
Pt called stating she is IP at hosp being worked up for abd pain. R/O obstruction. Pt states she wants to have her surgery while she is still IP. Pt is still going thru testing by GI. I advised pt she should speak with her GI MD that is caring for her in the hospital and he will call for surgical consult if it is appropriate at this time. Pt states she understands and will review this with Dr Jarold Motto.

## 2012-11-21 NOTE — Discharge Summary (Signed)
[facility] Discharge Summary  Name: [name] [name] MRN: [id number] DOB: 20-Mar-1966 47 y.o. PCP:  Jerl Mina, MD  Date of Admission: 11/18/2012  1:00 PM Date of Discharge: 11/21/2012 Primary Gastroenterologist: Sheryn Bison, MD Discharging Physician: [name], MD  Discharge Diagnosis:  1. Nausea, vomiting, diffuse abdominal pain. Symptoms improved but not resolved.  Extensive workup has been unrevealing.    2. Constipation, resolved  3. Umbilical region ventral hernia containing fat. Consultations:none  Procedures Performed:  Ct Abdomen Pelvis W Contrast  11/18/2012  *RADIOLOGY REPORT*  Clinical Data: Diffuse abdominal pain, nausea, diarrhea  CT ABDOMEN AND PELVIS WITH CONTRAST  Technique:  Multidetector CT imaging of the abdomen and pelvis was performed following the standard protocol during bolus administration of intravenous contrast.  Contrast: OMNIPAQUE IOHEXOL 300 MG/ML  SOLN  Comparison: 10/22/2012  Findings: Sagittal images of the spine are unremarkable.  Lung bases are unremarkable.  There are streak artifacts from the patient's large body habitus. Again noted fatty infiltration of the liver.  No focal hepatic mass.  No calcified gallstones are noted within gallbladder.  The pancreas, spleen and adrenals are unremarkable.  The kidneys are symmetrical in size and enhancement.  No hydronephrosis or hydroureter.  Delayed renal images shows bilateral renal symmetrical excretion.  Bilateral visualized proximal ureter is unremarkable.  No small bowel obstruction.  No ascites or free air.  No adenopathy.  A tiny abdominal wall hernia containing fat midline axial image 32 is noted without evidence of acute complication. There is a umbilical region ventral hernia containing fat without evidence of acute complication.  The patient is status post hysterectomy. Probable pessary within pelvis.No  pelvic ascites or adenopathy.  No inguinal adenopathy. There is no pericecal  inflammation.  The terminal ileum is unremarkable.  Impression: 1.  Again noted fatty infiltration of the liver. 2.  No acute inflammatory process within abdomen or pelvis. 3.  Status post hysterectomy. 4.  Umbilical region ventral hernia containing fat without evidence of acute complication.   Original Report Authenticated By: Natasha Mead, M.D.     GI Procedures: EGD and colonoscopy by Dr. Marina Goodell  History/Physical Exam:  See Admission H&P  Admission HPI: Per Dr. Jarold Motto. This is a 47 year old African American female who is status post previous cesarean section total abdominal hysterectomy. She's had at least 8 months of worsening diffuse abdominal pain with initial diarrhea, and now with rather marked constipation, gas and bloating. She has intermittent nausea and vomiting cannot eat much food has had anorexia and weight loss but denies fever, chills, or any specific hepatobiliary complaints. She does have acid reflux and indigestion, but denies abuse of alcohol, cigarettes, or NSAIDs. Chest CT scan of the abdomen in February 28 which was unremarkable except for small abdominal wall hernia. Patient denies melena, hematochezia, or any history of hepatitis or pancreatitis. He said that she has a" low pain threshold, and then her pain has become intolerable.  I have reviewed this patient's present history, medical and surgical past history, allergies and medications.    Hospital Course by problem list:  1. Nausea, vomiting, diffuse abdominal pain. Patient admitted from our office to [facility] for progressive abdominal pain. Admitting CMET, lipase and CBC were normal. A repeat CTscan with contrast was normal except for an umbilical region ventral hernia containing fat. The patient's diffuse mid abdominal pain, nausea and vomiting persisted so we pursued EGD and colonoscopy , both of which were normal. Following the procedure patient tolerated solid food. She was given copies  of endoscopic reports,  results reviewed with her. Patient was discharged home with a follow up appointment to see Dr. Jarold Motto her primary GI MD. Patient was upset about being discharged. She asked for surgery to see her in hope of doing hernia repair. We had explained that surgery (Dr. Magnus Ivan) didn't feel the umbilical hernia was causing her symptoms but she could discuss further at time of her next office visit with him. Patient remained upset about discharge but finally agreed to go home. Patient was getting frequent pain medications in the hospital. She asked for pain meds at discharge but we didn't prescribe any   2. Constipation, resolved.  3. Small fat containing umbilical hernia evaluated by surgery (Dr. Magnus Ivan) a few weeks ago. Surgery didn't seem convinced that hernia was source of patient's abdominal pain    Discharge Vitals:  BP 112/71  Pulse 79  Temp(Src) 98.2 F (36.8 C) (Oral)  Resp 12  Ht 5\' 2"  (1.575 m)  Wt 267 lb (121.11 kg)  BMI 48.82 kg/m2  SpO2 100%  Discharge Labs: No results found for this or any previous visit (from the past 24 hour(s)).  Disposition and follow-up:   Ms.Mariame C Hofmeister was discharged from [facility] in stable condition.    Follow-up Appointments:  Future Appointments Provider Department Dept Phone   12/04/2012 3:00 PM Mardella Layman, MD [facility] [phone]      Discharge Medications:   Medication List    ASK your doctor about these medications       albuterol 108 (90 BASE) MCG/ACT inhaler  Commonly known as:  PROVENTIL HFA;VENTOLIN HFA  Inhale 2 puffs into the lungs every 4 (four) hours as needed. For asthma     clonazePAM 1 MG tablet  Commonly known as:  KLONOPIN  Take 1 mg by mouth daily as needed. For onset of seizure and sleep     estradiol 2 MG vaginal ring  Commonly known as:  ESTRING  Place 2 mg vaginally every 3 (three) months. follow package directions     lamoTRIgine 100 MG tablet  Commonly known  as:  LAMICTAL  Take 100 mg by mouth 2 (two) times daily.     ondansetron 4 MG tablet  Commonly known as:  ZOFRAN  Take 1 tablet (4 mg total) by mouth every 8 (eight) hours as needed for nausea.     SUMAtriptan 100 MG tablet  Commonly known as:  IMITREX  Take 100 mg by mouth every 2 (two) hours as needed. For migraine max 2 times per 24 hours     topiramate 100 MG tablet  Commonly known as:  TOPAMAX  Take 100 mg by mouth 2 (two) times daily.        Signed: Willette Cluster 11/21/2012, 12:06 PM

## 2012-11-21 NOTE — H&P (View-Only) (Signed)
[facility] Progress Note  SUBJECTIVE: still having abdominal pain. Minimal results with Miralax and two enemas.    OBJECTIVE:  Vital signs in last 24 hours: Temp:  [98.3 F (36.8 C)-98.6 F (37 C)] 98.3 F (36.8 C) (03/27 1610) Pulse Rate:  [54-60] 54 (03/27 0637) Resp:  [16] 16 (03/27 0637) BP: (112-117)/(64-68) 117/64 mmHg (03/27 0637) SpO2:  [98 %-99 %] 98 % (03/27 0637) Last BM Date: 11/19/12 (Small amount after Enema) General:    [name] [name] in NAD Heart:  Regular rate and rhythm Abdomen:  Soft, nondistended, mild diffuse lower abdominal tenderenss. Normal bowel sounds. Extremities:  Without edema. Neurologic:  Alert and oriented,  grossly normal neurologically. Psych:  Cooperative.    Lab Results:  Recent Labs  11/18/12 1313  WBC 8.4  HGB 14.1  HCT 42.5  PLT 190   BMET  Recent Labs  11/18/12 1313  NA 140  K 3.7  CL 107  CO2 24  GLUCOSE 89  BUN 12  CREATININE 0.81  CALCIUM 9.2   LFT  Recent Labs  11/18/12 1313  PROT 8.1  ALBUMIN 3.9  AST 16  ALT 19  ALKPHOS 111  BILITOT 0.3  BILIDIR <0.1  IBILI NOT CALCULATED   Studies/Results: Ct Abdomen Pelvis W Contrast  11/18/2012  *RADIOLOGY REPORT*  Clinical Data: Diffuse abdominal pain, nausea, diarrhea  CT ABDOMEN AND PELVIS WITH CONTRAST  Technique:  Multidetector CT imaging of the abdomen and pelvis was performed following the standard protocol during bolus administration of intravenous contrast.  Contrast: OMNIPAQUE IOHEXOL 300 MG/ML  SOLN  Comparison: 10/22/2012  Findings: Sagittal images of the spine are unremarkable.  Lung bases are unremarkable.  There are streak artifacts from the patient's large body habitus. Again noted fatty infiltration of the liver.  No focal hepatic mass.  No calcified gallstones are noted within gallbladder.  The pancreas, spleen and adrenals are unremarkable.  The kidneys are symmetrical in size and enhancement.  No hydronephrosis or hydroureter.   Delayed renal images shows bilateral renal symmetrical excretion.  Bilateral visualized proximal ureter is unremarkable.  No small bowel obstruction.  No ascites or free air.  No adenopathy.  A tiny abdominal wall hernia containing fat midline axial image 32 is noted without evidence of acute complication. There is a umbilical region ventral hernia containing fat without evidence of acute complication.  The patient is status post hysterectomy. Probable pessary within pelvis.No  pelvic ascites or adenopathy.  No inguinal adenopathy. There is no pericecal inflammation.  The terminal ileum is unremarkable.  Impression: 1.  Again noted fatty infiltration of the liver. 2.  No acute inflammatory process within abdomen or pelvis. 3.  Status post hysterectomy. 4.  Umbilical region ventral hernia containing fat without evidence of acute complication.   Original Report Authenticated By: Natasha Mead, M.D.      ASSESSMENT / PLAN:  1. Abdominal pain, nausea. Still having diffuse mid to lower abdominal pain. Imaging studies negative. Will evaluate further with EGD /colonoscopy tomorrow. The risks, benefits, and alternatives to EGD and colonoscopy with possible biopsy and possible polypectomy were discussed with the patient and she consents to proceed.    2.  Constipation. Need to start bowel prep now as she will likely be difficult to clean out. Magnesium Citrate now then Mirlax prep this evening. Will d/c IV dilaudid, it is contributing to constipation. Try Vicodin Q6 hours prn.     LOS: 2 days   Willette Cluster  11/20/2012, 10:53 AM  GI ATTENDING  Patient personally seen and examined along with nurse practitioner. Agree with above. Patient continues to complain of vague "severe" abdominal pain. Also reports headache" just feels bad". She is very concerned as to cause for her abdominal complaints. Not interested in pursuing additional workup as outpatient. We will plan colonoscopy and upper endoscopy tomorrow.  Anticipate discharge thereafter, with appropriate followup, if no overwhelming findings.The nature of the procedure, as well as the risks, benefits, and alternatives were carefully and thoroughly reviewed with the patient. Ample time for discussion and questions allowed. The patient understood, was satisfied, and agreed to proceed.  [name] [name]. Eda Keys., M.D. [facility] Division of Gastroenterology

## 2012-11-21 NOTE — Op Note (Signed)
[facility] [address] [place], [address]   ENDOSCOPY PROCEDURE REPORT  PATIENT: [name], [name]  MR#: [id number] BIRTHDATE: [birth year] , 46  yrs. old GENDER: Female ENDOSCOPIST: Roxy Cedar, MD REFERRED BY:  Mardella Layman, M.D. PROCEDURE DATE:  11/21/2012 PROCEDURE:  EGD, diagnostic ASA CLASS:     Class II INDICATIONS:  abdominal pain. MEDICATIONS: Fentanyl 25 mcg IV and Versed 2 mg IV TOPICAL ANESTHETIC: Cetacaine Spray  DESCRIPTION OF PROCEDURE: After the risks benefits and alternatives of the procedure were thoroughly explained, informed consent was obtained.  The Pentax [id number] 3.8 [id number] endoscope was introduced through the mouth and advanced to the second portion of the duodenum. Without limitations.  The instrument was slowly withdrawn as the mucosa was fully examined.      The upper, middle and distal third of the esophagus were carefully inspected and no abnormalities were noted.  The z-line was well seen at the GEJ.  The endoscope was pushed into the fundus which was normal including a retroflexed view.  The antrum, gastric body, first and second part of the duodenum were unremarkable. Retroflexed views revealed no abnormalities.     The scope was then withdrawn from the patient and the procedure completed.  COMPLICATIONS: There were no complications. ENDOSCOPIC IMPRESSION: 1. Normal EGD  RECOMMENDATIONS: 1.  D/C home today 2.  Empiric PPI 3.  Follow up with Dr.  Jarold Motto 2-3 weeks  REPEAT EXAM:  eSigned:  Roxy Cedar, MD 11/21/2012 9:05 AM   CC:The Patient and Sheryn Bison, MD

## 2012-11-21 NOTE — Interval H&P Note (Signed)
History and Physical Interval Note:  11/21/2012 8:34 AM  [name] [name]  has presented today for surgery, with the diagnosis of nausea, abdominal pain  The various methods of treatment have been discussed with the patient and family. After consideration of risks, benefits and other options for treatment, the patient has consented to  Procedure(s): ESOPHAGOGASTRODUODENOSCOPY (EGD) (N/A) COLONOSCOPY (N/A) as a surgical intervention .  The patient's history has been reviewed, patient examined, no change in status, stable for surgery.  I have reviewed the patient's chart and labs.  Questions were answered to the patient's satisfaction.     [name]

## 2012-11-24 ENCOUNTER — Encounter (HOSPITAL_COMMUNITY): Payer: Self-pay | Admitting: Internal Medicine

## 2012-11-25 ENCOUNTER — Encounter (INDEPENDENT_AMBULATORY_CARE_PROVIDER_SITE_OTHER): Payer: Self-pay | Admitting: Surgery

## 2012-11-25 ENCOUNTER — Ambulatory Visit (INDEPENDENT_AMBULATORY_CARE_PROVIDER_SITE_OTHER): Payer: BC Managed Care – PPO | Admitting: Surgery

## 2012-11-25 VITALS — BP 130/86 | HR 68 | Temp 97.3°F | Resp 16 | Ht 63.0 in | Wt 268.0 lb

## 2012-11-25 DIAGNOSIS — K432 Incisional hernia without obstruction or gangrene: Secondary | ICD-10-CM

## 2012-11-25 NOTE — Progress Notes (Signed)
Subjective:     Patient ID: [name] [name], female   DOB: [birth year], 47 y.o.   MRN: [id number]  HPI She is here for a followup visit. She has now seen a gastroenterologist and was admitted to the hospital for her pain. A repeat CAT scan showed no acute changes and no intra-abdominal pathology other than a small ventral hernia containing only fat. She had an upper and lower endoscopy which was also unremarkable. She still complains of burning pain in her abdomen  Review of Systems     Objective:   Physical Exam On exam, she is tender at the umbilicus and there is a small hernia defect with no evidence of incarceration    Assessment:     Incisional hernia with abdominal pain     Plan:     I will now schedule her for a diagnostic laparoscopy with laparoscopic repair of the incisional hernia with mesh

## 2012-11-28 ENCOUNTER — Other Ambulatory Visit: Payer: Self-pay | Admitting: Obstetrics & Gynecology

## 2012-12-04 ENCOUNTER — Ambulatory Visit: Payer: BC Managed Care – PPO | Admitting: Gastroenterology

## 2012-12-05 ENCOUNTER — Encounter (HOSPITAL_COMMUNITY): Payer: Self-pay

## 2012-12-10 ENCOUNTER — Encounter (HOSPITAL_COMMUNITY)
Admission: RE | Admit: 2012-12-10 | Discharge: 2012-12-10 | Disposition: A | Discharge status: Home / Self Care | Payer: BC Managed Care – PPO | Source: Ambulatory Visit | Attending: Anesthesiology | Admitting: Anesthesiology

## 2012-12-10 ENCOUNTER — Encounter (HOSPITAL_COMMUNITY)
Admission: RE | Admit: 2012-12-10 | Discharge: 2012-12-10 | Disposition: A | Discharge status: Home / Self Care | Payer: BC Managed Care – PPO | Source: Ambulatory Visit | Attending: Surgery | Admitting: Surgery

## 2012-12-10 ENCOUNTER — Encounter (HOSPITAL_COMMUNITY): Payer: Self-pay

## 2012-12-10 DIAGNOSIS — K432 Incisional hernia without obstruction or gangrene: Secondary | ICD-10-CM | POA: Insufficient documentation

## 2012-12-10 DIAGNOSIS — Z01812 Encounter for preprocedural laboratory examination: Secondary | ICD-10-CM | POA: Insufficient documentation

## 2012-12-10 DIAGNOSIS — Z01818 Encounter for other preprocedural examination: Secondary | ICD-10-CM | POA: Insufficient documentation

## 2012-12-10 HISTORY — DX: Gastro-esophageal reflux disease without esophagitis: K21.9

## 2012-12-10 LAB — BASIC METABOLIC PANEL
CO2: 24 mEq/L (ref 19–32)
Calcium: 9.1 mg/dL (ref 8.4–10.5)
GFR calc non Af Amer: 82 mL/min — ABNORMAL LOW (ref >90)
Potassium: 4.2 mEq/L (ref 3.5–5.1)
Sodium: 141 mEq/L (ref 135–145)

## 2012-12-10 LAB — CBC
Hemoglobin: 13.5 g/dL (ref 12.0–15.0)
Platelets: 176 10*3/uL (ref 150–400)
RBC: 4.77 MIL/uL (ref 3.87–5.11)
WBC: 7.8 10*3/uL (ref 4.0–10.5)

## 2012-12-10 LAB — SURGICAL PCR SCREEN: Staphylococcus aureus: NEGATIVE

## 2012-12-10 NOTE — Pre-Procedure Instructions (Addendum)
[name] [name]  12/10/2012   Your procedure is scheduled on: Wednesday, December 17, 2012  Report to [facility] Stay Center at  7:00 AM.  Call this number if you have problems the morning of surgery: [phone]   Remember:   Do not eat food or drink liquids after midnight.   Take these medicines the morning of surgery with A SIP OF WATER: topiramate (TOPAMAX) 100 MG tablet, lamoTRIgine (LAMICTAL) 100 MG,  omeprazole (PRILOSEC) 20 MG capsule  If needed:  albuterol (PROVENTIL HFA;VENTOLIN HFA) 108 (90 BASE) MCG/ACT inhaler          SUMAtriptan (IMITREX) 100 MG tablet      clonazePAM (KLONOPIN) 1 MG tablet,  ondansetron (ZOFRAN) 4 MG tablet      Stop taking Aspirin, Coumadin, Plavix, Effient, and herbal medications. Do not take any NSAIDs ie: Ibuprofen, Advil, Naproxen or any medication containing Aspirin.                   Do not wear jewelry, make-up or nail polish.  Do not wear lotions, powders, or perfumes. You may wear deodorant.  Do not shave 48 hours prior to surgery. Men may shave face and neck.  Do not bring valuables to the hospital.  Contacts, dentures or bridgework may not be worn into surgery.  Leave suitcase in the car. After surgery it may be brought to your room.  For patients admitted to the hospital, checkout time is 11:00 AM the day of discharge.   Patients discharged the day of surgery will not be allowed to drive home.  Name and phone number of your driver:  Special Instructions: Shower using CHG 2 nights before surgery and the night before surgery.  If you shower the day of surgery use CHG.  Use special wash - you have one bottle of CHG for all showers.  You should use approximately 1/3 of the bottle for each shower.   Please read over the following fact sheets that you were given: Pain Booklet, Coughing and Deep Breathing and Surgical Site Infection Prevention

## 2012-12-11 NOTE — Progress Notes (Signed)
Anesthesia chart review: Patient is a 47 year old female scheduled for laparoscopic incisional hernia repair with mesh on 12/17/2012 by Dr. Magnus Ivan. History includes morbid obesity, previous cesarean section and total abdominal hysterectomy, nonsmoker, migraines, asthma, GERD, seizures on Lamictal,Topamax, and Klonopin. PCP is Dr. Jerl Mina.  GI is Dr. Sheryn Bison.  EKG on 12/10/12 showed SB @ 49 bpm.  HR at the time of her PAT vitals was 69 bpm.  Previous EKGs in [facility] have showed a rate ~ 55 - 85 bpm.  Echo on 05/22/07 showed: Overall left ventricular systolic function was normal. Left ventricular ejection fraction was estimated , range being 60 % to 65 %. There were no left ventricular regional wall motion abnormalities. Left ventricular diastolic function parameters were normal. Trivial MR/TR.  Nuclear stress test on 05/29/07 showed: 1. No evidence of myocardial ischemia or infarction.  2. Normal left ventricular wall motion.  3. QGS ejection fraction of 57%.   CXR on 12/10/12 showed no active disease.  Preoperative labs noted.  Patient's PAT EKG shows SB, otherwise WNL.  Review of her most recent vitals show her average HR tends to be in the 60's.  If she remains asymptomatic of her bradycardia and otherwise no significant changes in her status then anticipate she could proceed as planned.  [name] [facility] Stay Center/Anesthesiology Phone [phone] 12/11/2012 10:06 AM

## 2012-12-16 MED ORDER — CIPROFLOXACIN IN D5W 400 MG/200ML IV SOLN
400.0000 mg | INTRAVENOUS | Status: AC
  Administered 2012-12-17: 400 mg via INTRAVENOUS
  Filled 2012-12-16: qty 200

## 2012-12-17 ENCOUNTER — Inpatient Hospital Stay (HOSPITAL_COMMUNITY)
Admission: RE | Admit: 2012-12-17 | Discharge: 2012-12-21 | DRG: 160 | Disposition: A | Discharge status: Home / Self Care | Payer: BC Managed Care – PPO | Source: Ambulatory Visit | Attending: Surgery | Admitting: Surgery

## 2012-12-17 ENCOUNTER — Encounter (HOSPITAL_COMMUNITY): Payer: Self-pay | Admitting: Vascular Surgery

## 2012-12-17 ENCOUNTER — Ambulatory Visit (HOSPITAL_COMMUNITY): Payer: BC Managed Care – PPO | Admitting: Anesthesiology

## 2012-12-17 ENCOUNTER — Encounter (HOSPITAL_COMMUNITY): Payer: Self-pay | Admitting: Anesthesiology

## 2012-12-17 ENCOUNTER — Encounter (HOSPITAL_COMMUNITY)
Admission: RE | Disposition: A | Discharge status: Home / Self Care | Payer: Self-pay | Source: Ambulatory Visit | Attending: Surgery

## 2012-12-17 DIAGNOSIS — K219 Gastro-esophageal reflux disease without esophagitis: Secondary | ICD-10-CM | POA: Diagnosis present

## 2012-12-17 DIAGNOSIS — G40909 Epilepsy, unspecified, not intractable, without status epilepticus: Secondary | ICD-10-CM | POA: Diagnosis present

## 2012-12-17 DIAGNOSIS — E66813 Obesity, class 3: Secondary | ICD-10-CM | POA: Diagnosis present

## 2012-12-17 DIAGNOSIS — Z6841 Body Mass Index (BMI) 40.0 and over, adult: Secondary | ICD-10-CM

## 2012-12-17 DIAGNOSIS — Z79899 Other long term (current) drug therapy: Secondary | ICD-10-CM

## 2012-12-17 DIAGNOSIS — K432 Incisional hernia without obstruction or gangrene: Secondary | ICD-10-CM

## 2012-12-17 DIAGNOSIS — Z01812 Encounter for preprocedural laboratory examination: Secondary | ICD-10-CM

## 2012-12-17 DIAGNOSIS — K668 Other specified disorders of peritoneum: Secondary | ICD-10-CM | POA: Diagnosis present

## 2012-12-17 DIAGNOSIS — J45909 Unspecified asthma, uncomplicated: Secondary | ICD-10-CM | POA: Diagnosis present

## 2012-12-17 HISTORY — PX: INSERTION OF MESH: SHX5868

## 2012-12-17 HISTORY — PX: INCISIONAL HERNIA REPAIR: SHX193

## 2012-12-17 HISTORY — DX: Morbid (severe) obesity due to excess calories: E66.01

## 2012-12-17 SURGERY — REPAIR, HERNIA, INCISIONAL, LAPAROSCOPIC
Anesthesia: General | Wound class: Clean

## 2012-12-17 MED ORDER — PANTOPRAZOLE SODIUM 40 MG PO TBEC
40.0000 mg | DELAYED_RELEASE_TABLET | Freq: Every day | ORAL | Status: DC
  Administered 2012-12-18 – 2012-12-21 (×4): 40 mg via ORAL
  Filled 2012-12-17 (×4): qty 1

## 2012-12-17 MED ORDER — LACTATED RINGERS IV SOLN
INTRAVENOUS | Status: DC | PRN
  Administered 2012-12-17: 10:00:00 via INTRAVENOUS

## 2012-12-17 MED ORDER — BUPIVACAINE-EPINEPHRINE 0.25% -1:200000 IJ SOLN
INTRAMUSCULAR | Status: DC | PRN
  Administered 2012-12-17: 20 mL

## 2012-12-17 MED ORDER — HYDROMORPHONE 0.3 MG/ML IV SOLN
INTRAVENOUS | Status: AC
  Filled 2012-12-17: qty 25

## 2012-12-17 MED ORDER — OXYCODONE-ACETAMINOPHEN 5-325 MG PO TABS
1.0000 | ORAL_TABLET | ORAL | Status: DC | PRN
  Administered 2012-12-19 – 2012-12-20 (×4): 2 via ORAL
  Administered 2012-12-20: 1 via ORAL
  Administered 2012-12-20 – 2012-12-21 (×4): 2 via ORAL
  Filled 2012-12-17 (×9): qty 2

## 2012-12-17 MED ORDER — DIPHENHYDRAMINE HCL 12.5 MG/5ML PO ELIX: 12.5000 mg | ORAL_SOLUTION | Freq: Four times a day (QID) | ORAL | Status: DC | PRN

## 2012-12-17 MED ORDER — GLYCOPYRROLATE 0.2 MG/ML IJ SOLN
INTRAMUSCULAR | Status: DC | PRN
  Administered 2012-12-17: 0.6 mg via INTRAVENOUS

## 2012-12-17 MED ORDER — PROPOFOL 10 MG/ML IV BOLUS
INTRAVENOUS | Status: DC | PRN
  Administered 2012-12-17: 150 mg via INTRAVENOUS

## 2012-12-17 MED ORDER — POTASSIUM CHLORIDE IN NACL 20-0.9 MEQ/L-% IV SOLN
INTRAVENOUS | Status: DC
  Administered 2012-12-17: 15:00:00 via INTRAVENOUS
  Administered 2012-12-17: 100 mL/h via INTRAVENOUS
  Administered 2012-12-18: 08:00:00 via INTRAVENOUS
  Administered 2012-12-19: 50 mL/h via INTRAVENOUS
  Filled 2012-12-17 (×8): qty 1000

## 2012-12-17 MED ORDER — SUCCINYLCHOLINE CHLORIDE 20 MG/ML IJ SOLN
INTRAMUSCULAR | Status: DC | PRN
  Administered 2012-12-17: 100 mg via INTRAVENOUS

## 2012-12-17 MED ORDER — TOPIRAMATE 100 MG PO TABS
100.0000 mg | ORAL_TABLET | Freq: Two times a day (BID) | ORAL | Status: DC
  Administered 2012-12-17 – 2012-12-21 (×8): 100 mg via ORAL
  Filled 2012-12-17 (×9): qty 1

## 2012-12-17 MED ORDER — IBUPROFEN 600 MG PO TABS
600.0000 mg | ORAL_TABLET | Freq: Four times a day (QID) | ORAL | Status: DC | PRN
  Administered 2012-12-18 – 2012-12-20 (×6): 600 mg via ORAL
  Filled 2012-12-17 (×7): qty 1

## 2012-12-17 MED ORDER — HYDROMORPHONE HCL PF 1 MG/ML IJ SOLN
INTRAMUSCULAR | Status: AC
  Administered 2012-12-17: 0.5 mg via INTRAVENOUS
  Filled 2012-12-17: qty 1

## 2012-12-17 MED ORDER — PROMETHAZINE HCL 25 MG/ML IJ SOLN: 6.2500 mg | INTRAMUSCULAR | Status: DC | PRN

## 2012-12-17 MED ORDER — ONDANSETRON HCL 4 MG/2ML IJ SOLN
4.0000 mg | Freq: Four times a day (QID) | INTRAMUSCULAR | Status: DC | PRN
  Administered 2012-12-17 – 2012-12-18 (×3): 4 mg via INTRAVENOUS
  Filled 2012-12-17 (×2): qty 2

## 2012-12-17 MED ORDER — OXYCODONE HCL 5 MG/5ML PO SOLN: 5.0000 mg | Freq: Once | ORAL | Status: DC | PRN

## 2012-12-17 MED ORDER — LIDOCAINE HCL (CARDIAC) 20 MG/ML IV SOLN
INTRAVENOUS | Status: DC | PRN
  Administered 2012-12-17: 100 mg via INTRAVENOUS

## 2012-12-17 MED ORDER — ONDANSETRON HCL 4 MG/2ML IJ SOLN
4.0000 mg | Freq: Four times a day (QID) | INTRAMUSCULAR | Status: DC | PRN
  Administered 2012-12-18: 4 mg via INTRAVENOUS
  Filled 2012-12-17 (×2): qty 2

## 2012-12-17 MED ORDER — BUPIVACAINE-EPINEPHRINE PF 0.25-1:200000 % IJ SOLN
INTRAMUSCULAR | Status: AC
  Filled 2012-12-17: qty 30

## 2012-12-17 MED ORDER — LAMOTRIGINE 100 MG PO TABS
100.0000 mg | ORAL_TABLET | Freq: Two times a day (BID) | ORAL | Status: DC
  Administered 2012-12-17 – 2012-12-21 (×8): 100 mg via ORAL
  Filled 2012-12-17 (×9): qty 1

## 2012-12-17 MED ORDER — ENOXAPARIN SODIUM 40 MG/0.4ML ~~LOC~~ SOLN
40.0000 mg | SUBCUTANEOUS | Status: DC
  Administered 2012-12-18 – 2012-12-21 (×4): 40 mg via SUBCUTANEOUS
  Filled 2012-12-17 (×5): qty 0.4

## 2012-12-17 MED ORDER — LACTATED RINGERS IV SOLN: INTRAVENOUS | Status: DC

## 2012-12-17 MED ORDER — CIPROFLOXACIN IN D5W 400 MG/200ML IV SOLN
INTRAVENOUS | Status: AC
  Filled 2012-12-17: qty 200

## 2012-12-17 MED ORDER — NALOXONE HCL 0.4 MG/ML IJ SOLN: 0.4000 mg | INTRAMUSCULAR | Status: DC | PRN

## 2012-12-17 MED ORDER — NEOSTIGMINE METHYLSULFATE 1 MG/ML IJ SOLN
INTRAMUSCULAR | Status: DC | PRN
  Administered 2012-12-17: 4 mg via INTRAVENOUS

## 2012-12-17 MED ORDER — ONDANSETRON HCL 4 MG/2ML IJ SOLN
INTRAMUSCULAR | Status: DC | PRN
  Administered 2012-12-17: 4 mg via INTRAVENOUS

## 2012-12-17 MED ORDER — HYDROMORPHONE HCL PF 1 MG/ML IJ SOLN
1.0000 mg | INTRAMUSCULAR | Status: DC | PRN
  Administered 2012-12-19: 1 mg via INTRAVENOUS
  Filled 2012-12-17: qty 1

## 2012-12-17 MED ORDER — CLONAZEPAM 1 MG PO TABS
1.0000 mg | ORAL_TABLET | Freq: Every day | ORAL | Status: DC | PRN
  Administered 2012-12-19 – 2012-12-20 (×2): 1 mg via ORAL
  Filled 2012-12-17 (×2): qty 1

## 2012-12-17 MED ORDER — OXYCODONE HCL 5 MG PO TABS
5.0000 mg | ORAL_TABLET | Freq: Once | ORAL | Status: DC | PRN
Start: 2012-12-17 — End: 2012-12-17

## 2012-12-17 MED ORDER — KETOROLAC TROMETHAMINE 30 MG/ML IJ SOLN
INTRAMUSCULAR | Status: DC | PRN
  Administered 2012-12-17: 30 mg via INTRAVENOUS

## 2012-12-17 MED ORDER — ALBUTEROL SULFATE HFA 108 (90 BASE) MCG/ACT IN AERS
2.0000 | INHALATION_SPRAY | Freq: Four times a day (QID) | RESPIRATORY_TRACT | Status: DC | PRN
  Filled 2012-12-17: qty 6.7

## 2012-12-17 MED ORDER — MIDAZOLAM HCL 5 MG/5ML IJ SOLN
INTRAMUSCULAR | Status: DC | PRN
  Administered 2012-12-17: 2 mg via INTRAVENOUS

## 2012-12-17 MED ORDER — 0.9 % SODIUM CHLORIDE (POUR BTL) OPTIME
TOPICAL | Status: DC | PRN
  Administered 2012-12-17: 1000 mL

## 2012-12-17 MED ORDER — HYDROMORPHONE 0.3 MG/ML IV SOLN
INTRAVENOUS | Status: DC
  Administered 2012-12-17: 0.9 mg via INTRAVENOUS
  Administered 2012-12-17: 1.5 mg via INTRAVENOUS
  Administered 2012-12-17: 12:00:00 via INTRAVENOUS
  Administered 2012-12-18: 0.9 mg via INTRAVENOUS
  Administered 2012-12-18: 0.6 mg via INTRAVENOUS
  Administered 2012-12-18: 2.1 mg via INTRAVENOUS
  Administered 2012-12-18: 0.3 mg via INTRAVENOUS
  Administered 2012-12-18: 10:00:00 via INTRAVENOUS
  Administered 2012-12-18 (×2): 0.6 mg via INTRAVENOUS
  Administered 2012-12-19: 2.1 mg via INTRAVENOUS
  Administered 2012-12-19: 0.3 mg via INTRAVENOUS
  Filled 2012-12-17: qty 25

## 2012-12-17 MED ORDER — ROCURONIUM BROMIDE 100 MG/10ML IV SOLN
INTRAVENOUS | Status: DC | PRN
  Administered 2012-12-17: 30 mg via INTRAVENOUS
  Administered 2012-12-17: 10 mg via INTRAVENOUS

## 2012-12-17 MED ORDER — ONDANSETRON HCL 4 MG PO TABS
4.0000 mg | ORAL_TABLET | Freq: Four times a day (QID) | ORAL | Status: DC | PRN
  Administered 2012-12-19 – 2012-12-21 (×4): 4 mg via ORAL
  Filled 2012-12-17 (×4): qty 1

## 2012-12-17 MED ORDER — SODIUM CHLORIDE 0.9 % IJ SOLN: 9.0000 mL | INTRAMUSCULAR | Status: DC | PRN

## 2012-12-17 MED ORDER — DIPHENHYDRAMINE HCL 50 MG/ML IJ SOLN: 12.5000 mg | Freq: Four times a day (QID) | INTRAMUSCULAR | Status: DC | PRN

## 2012-12-17 MED ORDER — HYDROMORPHONE HCL PF 1 MG/ML IJ SOLN
0.2500 mg | INTRAMUSCULAR | Status: DC | PRN
  Administered 2012-12-17 (×2): 0.5 mg via INTRAVENOUS

## 2012-12-17 MED ORDER — MEPERIDINE HCL 25 MG/ML IJ SOLN: 6.2500 mg | INTRAMUSCULAR | Status: DC | PRN

## 2012-12-17 MED ORDER — FENTANYL CITRATE 0.05 MG/ML IJ SOLN
INTRAMUSCULAR | Status: DC | PRN
  Administered 2012-12-17 (×2): 50 ug via INTRAVENOUS
  Administered 2012-12-17: 100 ug via INTRAVENOUS
  Administered 2012-12-17: 50 ug via INTRAVENOUS

## 2012-12-17 SURGICAL SUPPLY — 51 items
APL SKNCLS STERI-STRIP NONHPOA (GAUZE/BANDAGES/DRESSINGS) ×2
APPLIER CLIP 5 13 M/L LIGAMAX5 (MISCELLANEOUS)
APPLIER CLIP ROT 10 11.4 M/L (STAPLE)
APR CLP MED LRG 11.4X10 (STAPLE)
APR CLP MED LRG 5 ANG JAW (MISCELLANEOUS)
BANDAGE ADHESIVE 1X3 (GAUZE/BANDAGES/DRESSINGS) ×9 IMPLANT
BENZOIN TINCTURE PRP APPL 2/3 (GAUZE/BANDAGES/DRESSINGS) ×3 IMPLANT
CANISTER SUCTION 2500CC (MISCELLANEOUS) IMPLANT
CHLORAPREP W/TINT 26ML (MISCELLANEOUS) ×2 IMPLANT
CLIP APPLIE 5 13 M/L LIGAMAX5 (MISCELLANEOUS) IMPLANT
CLIP APPLIE ROT 10 11.4 M/L (STAPLE) IMPLANT
CLOTH BEACON ORANGE TIMEOUT ST (SAFETY) ×2 IMPLANT
COVER SURGICAL LIGHT HANDLE (MISCELLANEOUS) ×2 IMPLANT
DECANTER SPIKE VIAL GLASS SM (MISCELLANEOUS) ×2 IMPLANT
DEVICE SECURE STRAP 25 ABSORB (INSTRUMENTS) ×4 IMPLANT
DEVICE TROCAR PUNCTURE CLOSURE (ENDOMECHANICALS) ×2 IMPLANT
ELECT REM PT RETURN 9FT ADLT (ELECTROSURGICAL) ×2
ELECTRODE REM PT RTRN 9FT ADLT (ELECTROSURGICAL) ×1 IMPLANT
GLOVE BIO SURGEON STRL SZ7.5 (GLOVE) ×1 IMPLANT
GLOVE BIOGEL PI IND STRL 6 (GLOVE) IMPLANT
GLOVE BIOGEL PI IND STRL 7.0 (GLOVE) IMPLANT
GLOVE BIOGEL PI IND STRL 7.5 (GLOVE) IMPLANT
GLOVE BIOGEL PI INDICATOR 6 (GLOVE) ×1
GLOVE BIOGEL PI INDICATOR 7.0 (GLOVE) ×1
GLOVE BIOGEL PI INDICATOR 7.5 (GLOVE) ×1
GLOVE SURG SIGNA 7.5 PF LTX (GLOVE) ×2 IMPLANT
GLOVE SURG SS PI 7.0 STRL IVOR (GLOVE) ×1 IMPLANT
GOWN BRE IMP SLV AUR LG STRL (GOWN DISPOSABLE) ×4 IMPLANT
GOWN PREVENTION PLUS XLARGE (GOWN DISPOSABLE) ×2 IMPLANT
GOWN STRL NON-REIN LRG LVL3 (GOWN DISPOSABLE) ×4 IMPLANT
KIT BASIN OR (CUSTOM PROCEDURE TRAY) ×2 IMPLANT
KIT ROOM TURNOVER OR (KITS) ×2 IMPLANT
MARKER SKIN DUAL TIP RULER LAB (MISCELLANEOUS) ×2 IMPLANT
MESH PHYSIO OVAL 20X25CM (Mesh General) ×1 IMPLANT
NDL SPNL 22GX3.5 QUINCKE BK (NEEDLE) ×1 IMPLANT
NEEDLE SPNL 22GX3.5 QUINCKE BK (NEEDLE) ×2 IMPLANT
NS IRRIG 1000ML POUR BTL (IV SOLUTION) ×2 IMPLANT
PAD ARMBOARD 7.5X6 YLW CONV (MISCELLANEOUS) ×2 IMPLANT
SCALPEL HARMONIC ACE (MISCELLANEOUS) IMPLANT
SCISSORS LAP 5X35 DISP (ENDOMECHANICALS) IMPLANT
SET IRRIG TUBING LAPAROSCOPIC (IRRIGATION / IRRIGATOR) IMPLANT
SLEEVE ENDOPATH XCEL 5M (ENDOMECHANICALS) ×2 IMPLANT
STRIP CLOSURE SKIN 1/2X4 (GAUZE/BANDAGES/DRESSINGS) ×3 IMPLANT
SUT MON AB 4-0 PC3 18 (SUTURE) ×2 IMPLANT
SUT NOVA NAB GS-21 0 18 T12 DT (SUTURE) ×2 IMPLANT
TOWEL OR 17X24 6PK STRL BLUE (TOWEL DISPOSABLE) ×2 IMPLANT
TOWEL OR 17X26 10 PK STRL BLUE (TOWEL DISPOSABLE) ×2 IMPLANT
TRAY FOLEY CATH 14FR (SET/KITS/TRAYS/PACK) IMPLANT
TRAY LAPAROSCOPIC (CUSTOM PROCEDURE TRAY) ×2 IMPLANT
TROCAR XCEL NON-BLD 11X100MML (ENDOMECHANICALS) ×2 IMPLANT
TROCAR XCEL NON-BLD 5MMX100MML (ENDOMECHANICALS) ×2 IMPLANT

## 2012-12-17 NOTE — Anesthesia Postprocedure Evaluation (Signed)
  Anesthesia Post-op Note  Patient: [name] [name]  Procedure(s) Performed: Procedure(s): LAPAROSCOPIC INCISIONAL HERNIA (N/A) INSERTION OF MESH (N/A)  Patient Location: PACU  Anesthesia Type:General  Level of Consciousness: awake  Airway and Oxygen Therapy: Patient Spontanous Breathing  Post-op Pain: mild  Post-op Assessment: Post-op Vital signs reviewed  Post-op Vital Signs: stable  Complications: No apparent anesthesia complications

## 2012-12-17 NOTE — Interval H&P Note (Signed)
History and Physical Interval Note: no change  12/17/2012 8:01 AM  [name] [name]  has presented today for surgery, with the diagnosis of incisional hernia  The various methods of treatment have been discussed with the patient and family. After consideration of risks, benefits and other options for treatment, the patient has consented to  Procedure(s): LAPAROSCOPIC INCISIONAL HERNIA (N/A) INSERTION OF MESH (N/A) as a surgical intervention .  The patient's history has been reviewed, patient examined, no change in status, stable for surgery.  I have reviewed the patient's chart and labs.  Questions were answered to the patient's satisfaction.     Wilborn Membreno A

## 2012-12-17 NOTE — Transfer of Care (Signed)
Immediate Anesthesia Transfer of Care Note  Patient: [name] [name]  Procedure(s) Performed: Procedure(s): LAPAROSCOPIC INCISIONAL HERNIA (N/A) INSERTION OF MESH (N/A)  Patient Location: PACU  Anesthesia Type:General  Level of Consciousness: awake, alert  and oriented  Airway & Oxygen Therapy: Patient Spontanous Breathing and Patient connected to nasal cannula oxygen  Post-op Assessment: Report given to PACU RN and Post -op Vital signs reviewed and stable  Post vital signs: Reviewed and stable  Complications: No apparent anesthesia complications

## 2012-12-17 NOTE — Progress Notes (Signed)
UR completed. Rajiv Parlato RNBSN 

## 2012-12-17 NOTE — Preoperative (Signed)
Beta Blockers   Reason not to administer Beta Blockers:Not Applicable 

## 2012-12-17 NOTE — H&P (Signed)
[name] [name] is an 47 y.o. female.   Chief Complaint: Abdominal pain HPI: She presents with ongoing workup of abdominal pain which is mostly centrally in the left lower quadrant. She has had a workup including a CAT scan and upper and lower endoscopy. She does have a small hernia containing only fat and upper trocar site from a previous left upper surgery. No other source of her discomfort has been found. Secondary to this the decision has been made to proceed with hernia repair  Past Medical History  Diagnosis Date  . Migraine   . Seizures   . Asthma   . GERD (gastroesophageal reflux disease)     Past Surgical History  Procedure Laterality Date  . Abdominal hysterectomy    . Cesarean section    . Esophagogastroduodenoscopy N/A 11/21/2012    Procedure: ESOPHAGOGASTRODUODENOSCOPY (EGD);  Surgeon: Hilarie Fredrickson, MD;  Location: [facility];  Service: Endoscopy;  Laterality: N/A;  . Colonoscopy N/A 11/21/2012    Procedure: COLONOSCOPY;  Surgeon: Hilarie Fredrickson, MD;  Location: [facility];  Service: Endoscopy;  Laterality: N/A;  . Dilation and curettage of uterus      Family History  Problem Relation Age of Onset  . Hypertension Mother   . Depression Mother   . Heart attack Father   . Hypertension Father   . Emphysema Father   . COPD Father   . Hypertension Sister    Social History:  reports that she has never smoked. She has never used smokeless tobacco. She reports that  drinks alcohol. She reports that she does not use illicit drugs.  Allergies:  Allergies  Allergen Reactions  . Amoxicillin Hives and Swelling  . Biaxin (Clarithromycin) Hives  . Erythromycin Base Hives  . Penicillins Hives and Swelling  . Zithromax (Azithromycin) Hives and Nausea Only    No prescriptions prior to admission    No results found for this or any previous visit (from the past 48 hour(s)). No results found.  Review of Systems  All other systems reviewed and are negative.    There were  no vitals taken for this visit. Physical Exam  Constitutional: She is oriented to person, place, and time. She appears well-developed and well-nourished. No distress.  Morbidly obese  HENT:  Head: Normocephalic and atraumatic.  Eyes: Conjunctivae are normal. Pupils are equal, round, and reactive to light.  Neck: Normal range of motion. Neck supple.  Cardiovascular: Normal rate, regular rhythm, normal heart sounds and intact distal pulses.   No murmur heard. Respiratory: Effort normal and breath sounds normal. No respiratory distress.  GI: Soft. Bowel sounds are normal. She exhibits no distension. There is no tenderness.  Musculoskeletal: Normal range of motion.  Lymphadenopathy:    She has no cervical adenopathy.  Neurological: She is alert and oriented to person, place, and time.  Skin: Skin is warm and dry. No rash noted. No erythema.  Psychiatric: Her behavior is normal. Judgment normal.     Assessment/Plan Ventral incisional hernia  I will now proceed with laparoscopic repair with mesh. This will at least allow me to assess the rest of the abdominal cavity to lyse adhesions if necessary. I discussed the risks of surgery which includes but is not limited to bleeding, infection, injury to the bowel, cardiopulmonary issues given her obesity, the need to convert to an open procedure, recurrence, the chance this may not solve all her abdominal complaints, et Karie Soda. She understands and wishes to proceed. Likelihood of success is good  [name] [name] A 12/17/2012, 7:58 AM

## 2012-12-17 NOTE — Anesthesia Preprocedure Evaluation (Addendum)
Anesthesia Evaluation  Patient identified by MRN, date of birth, ID band Patient awake    Reviewed: Allergy & Precautions, H&P , NPO status , Patient's Chart, lab work & pertinent test results  History of Anesthesia Complications Negative for: history of anesthetic complications  Airway Mallampati: I  Neck ROM: Full    Dental  (+) Teeth Intact and Dental Advisory Given   Pulmonary asthma ,  breath sounds clear to auscultation        Cardiovascular Rhythm:Regular Rate:Normal     Neuro/Psych  Headaches,    GI/Hepatic Neg liver ROS, GERD-  ,  Endo/Other  negative endocrine ROSMorbid obesity  Renal/GU negative Renal ROS     Musculoskeletal   Abdominal (+) + obese,   Peds  Hematology   Anesthesia Other Findings   Reproductive/Obstetrics                          Anesthesia Physical Anesthesia Plan  ASA: III  Anesthesia Plan: General   Post-op Pain Management:    Induction: Intravenous  Airway Management Planned: Oral ETT  Additional Equipment:   Intra-op Plan:   Post-operative Plan: Extubation in OR  Informed Consent: I have reviewed the patients History and Physical, chart, labs and discussed the procedure including the risks, benefits and alternatives for the proposed anesthesia with the patient or authorized representative who has indicated his/her understanding and acceptance.   Dental advisory given  Plan Discussed with:   Anesthesia Plan Comments:         Anesthesia Quick Evaluation

## 2012-12-17 NOTE — Op Note (Signed)
LAPAROSCOPIC INCISIONAL HERNIA, INSERTION OF MESH  Procedure Note  MEDIA PIZZINI 12/17/2012   Pre-op Diagnosis: incisional hernia     Post-op Diagnosis: same and peritoneal cyst  Procedure(s): LAPAROSCOPIC INCISIONAL HERNIA REPAIR WITH MESH (20 X 25 CM PHYSIO) EXCISION PERITONEAL CYST   Surgeon(s): Shelly Rubenstein, MD  Anesthesia: General  Staff:  Circulator: Sudie Bailey, RN Scrub Person: Leighton Parody, CST; Janeece Agee Pingue, CST  Estimated Blood Loss: Minimal               Specimens: sent to path          [facility] A   Date: 12/17/2012  Time: 11:14 AM

## 2012-12-18 ENCOUNTER — Encounter (HOSPITAL_COMMUNITY): Payer: Self-pay | Admitting: Surgery

## 2012-12-18 LAB — BASIC METABOLIC PANEL
BUN: 11 mg/dL (ref 6–23)
GFR calc non Af Amer: 84 mL/min — ABNORMAL LOW (ref >90)
Glucose, Bld: 105 mg/dL — ABNORMAL HIGH (ref 70–99)
Potassium: 3.9 mEq/L (ref 3.5–5.1)

## 2012-12-18 LAB — CBC
HCT: 34.9 % — ABNORMAL LOW (ref 36.0–46.0)
Hemoglobin: 11.8 g/dL — ABNORMAL LOW (ref 12.0–15.0)
MCH: 27.8 pg (ref 26.0–34.0)
MCHC: 33.8 g/dL (ref 30.0–36.0)

## 2012-12-18 NOTE — Progress Notes (Signed)
12-18-12 UR completed. Ronny Flurry RN BSN

## 2012-12-18 NOTE — Op Note (Signed)
NAMEAIMI, ESSNER NO.:  [id number]  MEDICAL RECORD NO.:  [id number]  LOCATION:  [id number]                        FACILITY:  [facility]  PHYSICIAN:  Abigail Miyamoto, M.D. DATE OF BIRTH:  October 30, 1965  DATE OF PROCEDURE:  12/17/2012 DATE OF DISCHARGE:                              OPERATIVE REPORT   PREOPERATIVE DIAGNOSIS:  Incisional hernia.  POSTOPERATIVE DIAGNOSIS: 1. Incisional hernia. 2. Peritoneal cyst.  PROCEDURE: 1. Laparoscopic incisional hernia repair with mesh (20 cm x 25 cm     physio mesh). 2. Excision of peritoneal cyst.  SURGEON:  Abigail Miyamoto, M.D.  ANESTHESIA:  General and 0.5% Marcaine.  ESTIMATED BLOOD LOSS:  Minimal.  INDICATIONS:  [name] [name] is a 47 year old female who has presented with multiple admissions for severe diffuse abdominal pain, nausea, and diarrhea.  She has been found to have on CAT scan a small incisional hernia at a trocar site above the umbilicus.  The rest of her CAT scan has otherwise been unremarkable.  Decision was made to proceed with a ventral hernia repair with mesh laparoscopically after her complete workup including upper and lower endoscopy were normal.  FINDINGS:  The patient was found to have a small fascial defect containing omentum in the upper abdomen.  She also was developing thinning of the fascia and a fascial defect at the umbilicus and the lower midline from her previous surgery.  The patient also has a tubular- appearing cyst going from the right lateral sidewall to the midline, which was excised.  It was tubular in nature.  PROCEDURE IN DETAIL:  The patient was brought to the operating room, identified as [name].  She was placed supine on the operative table and general anesthesia was induced.  Her abdomen was then prepped and draped in usual sterile fashion.  I made a small incision in the right upper quadrant with a scalpel.  I then used a 5 mm trocar and Opti- Vu camera to  traverse all levels of the abdominal wall and gain entrance to the peritoneal cavity under direct vision.  Insufflation of the abdomen was then begun.  I evaluated the insertion site and saw no evidence of bowel injury.  I then placed an 11 mm port in the patient's left mid flank and a 5 mm port in the patient's left lower quadrant. The patient had adhesions of omentum to the abdominal wall.  She did have a small fascial defect at the trocar site in the upper abdomen. The fascia was splayed out of the umbilicus and going midway down the old midline incision consistent with a developing hernia as well.  The patient also had a tubular structure on the peritoneal surface at the midline traversing laterally and ending blindly and it was difficult to tell whether this was a source of discomfort as well.  This area was then to be covered with the mesh so I elected to excise it with Harmonic scalpel and send it to Pathology for evaluation.  I then measured the extent of the defect and brought a 20/25 cm piece of physio mesh onto the field.  I placed 4 separate 0-Novafil sutures in the 4 corners of the mesh.  I then rolled the mesh in place with the 11 mm trocar into the abdominal cavity under direct vision.  I then unrolled the mesh under direct vision.  I made 4 separate stab incision with a scalpel and used the suture passer to pull all the sutures up to the abdominal wall in 4 separate locations.  I then pulled the mesh up tight and tied the sutures in place, pulling the mesh up against the abdominal wall, greater than 4 cm of overlap around the fascial defects appeared to be achieved circumferentially.  I then tacked the mesh in place circumferentially with the secure strap absorbable tacks.  Again good coverage of the entire fascial defects and abdominal wall appeared to be achieved.  At this point, the abdominal cavity was again examined. There was no evidence of bowel injury, and  hemostasis appeared to be achieved.  All ports were removed under direct vision.  The abdomen was deflated.  I anesthetized all incisions with Marcaine and then closed with 4-0 Monocryl subcuticular sutures.  Steri-Strips and Band-Aids were then applied.  The patient tolerated the procedure well.  All counts were correct at the end of procedure.  The patient was then extubated in the operating room and taken in a stable condition to the recovery room.     Abigail Miyamoto, M.D.     DB/MEDQ  D:  12/17/2012  T:  12/18/2012  Job:  [id number]

## 2012-12-18 NOTE — Progress Notes (Signed)
1 Day Post-Op  Subjective: POD#1 Complains of moderate pain and moderate to severe nausea Has not ambulated  Objective: Vital signs in last 24 hours: Temp:  [97.5 F (36.4 C)-98.6 F (37 C)] 98 F (36.7 C) (04/24 0543) Pulse Rate:  [56-74] 61 (04/24 0543) Resp:  [12-21] 12 (04/24 0543) BP: (114-163)/(56-90) 118/61 mmHg (04/24 0543) SpO2:  [96 %-100 %] 99 % (04/24 0543) FiO2 (%):  [99 %-100 %] 99 % (04/24 0420) Weight:  [272 lb 0.8 oz (123.4 kg)] 272 lb 0.8 oz (123.4 kg) (04/23 1900) Last BM Date: 12/16/12  Intake/Output from previous day: 04/23 0701 - 04/24 0700 In: 3130.3 [P.O.:210; I.V.:2920.3] Out: 640 [Urine:600; Blood:40] Intake/Output this shift:    Lungs clear Abdomen soft and obese, dressings dry, no peritoneal signs  Lab Results:   Recent Labs  12/18/12 0530  WBC 7.4  HGB 11.8*  HCT 34.9*  PLT 162   BMET  Recent Labs  12/18/12 0530  NA 141  K 3.9  CL 111  CO2 22  GLUCOSE 105*  BUN 11  CREATININE 0.83  CALCIUM 8.7   PT/INR No results found for this basename: LABPROT, INR,  in the last 72 hours ABG No results found for this basename: PHART, PCO2, PO2, HCO3,  in the last 72 hours  Studies/Results: No results found.  Anti-infectives: Anti-infectives   Start     Dose/Rate Route Frequency Ordered Stop   12/17/12 0941  ciprofloxacin (CIPRO) 400 MG/200ML IVPB    Comments:  BERRY, [name]: cabinet override      12/17/12 0941 12/17/12 2144   12/17/12 0600  ciprofloxacin (CIPRO) IVPB 400 mg     400 mg 200 mL/hr over 60 Minutes Intravenous On call to O.R. 12/16/12 1427 12/17/12 0945      Assessment/Plan: s/p Procedure(s): LAPAROSCOPIC INCISIONAL HERNIA (N/A) INSERTION OF MESH (N/A)  Decrease IVF Keep on PCA Ambulate Make regular admission  LOS: 1 day    [name] [name] A 12/18/2012

## 2012-12-18 NOTE — Progress Notes (Signed)
Last night pt did dangle on the side of the bed approx. 20 mins and ambulated to the bathroom with assistance Ilean Skill LPN

## 2012-12-19 MED ORDER — HYDROMORPHONE HCL PF 1 MG/ML IJ SOLN: 0.5000 mg | INTRAMUSCULAR | Status: DC | PRN

## 2012-12-19 MED ORDER — OXYCODONE-ACETAMINOPHEN 5-325 MG PO TABS: 1.0000 | ORAL_TABLET | ORAL | Status: DC | PRN

## 2012-12-19 NOTE — Progress Notes (Signed)
2 Days Post-Op  Subjective: POD#2 Pain less, but still needing IV meds and not ambulating much  Objective: Vital signs in last 24 hours: Temp:  [97.8 F (36.6 C)-98.5 F (36.9 C)] 97.8 F (36.6 C) (04/25 0519) Pulse Rate:  [62-71] 66 (04/25 0519) Resp:  [14-21] 15 (04/25 0519) BP: (105-140)/(54-83) 107/56 mmHg (04/25 0519) SpO2:  [94 %-100 %] 97 % (04/25 0519) Last BM Date: 12/17/12  Intake/Output from previous day: 04/24 0701 - 04/25 0700 In: 1492.5 [P.O.:380; I.V.:1112.5] Out: 2750 [Urine:2500; Emesis/NG output:250] Intake/Output this shift: Total I/O In: 1312.5 [P.O.:200; I.V.:1112.5] Out: 1100 [Urine:1100]  Abdomen soft, obese, less tender  Lab Results:   Recent Labs  12/18/12 0530  WBC 7.4  HGB 11.8*  HCT 34.9*  PLT 162   BMET  Recent Labs  12/18/12 0530  NA 141  K 3.9  CL 111  CO2 22  GLUCOSE 105*  BUN 11  CREATININE 0.83  CALCIUM 8.7   PT/INR No results found for this basename: LABPROT, INR,  in the last 72 hours ABG No results found for this basename: PHART, PCO2, PO2, HCO3,  in the last 72 hours  Studies/Results: No results found.  Anti-infectives: Anti-infectives   Start     Dose/Rate Route Frequency Ordered Stop   12/17/12 0941  ciprofloxacin (CIPRO) 400 MG/200ML IVPB    Comments:  BERRY, [name]: cabinet override      12/17/12 0941 12/17/12 2144   12/17/12 0600  ciprofloxacin (CIPRO) IVPB 400 mg     400 mg 200 mL/hr over 60 Minutes Intravenous On call to O.R. 12/16/12 1427 12/17/12 0945      Assessment/Plan: s/p Procedure(s): LAPAROSCOPIC INCISIONAL HERNIA (N/A) INSERTION OF MESH (N/A)  D/c PCA Ambulate Home tomorrow  LOS: 2 days    Ferd Horrigan A 12/19/2012

## 2012-12-20 MED ORDER — BISACODYL 10 MG RE SUPP
10.0000 mg | Freq: Once | RECTAL | Status: AC
  Administered 2012-12-20: 10 mg via RECTAL
  Filled 2012-12-20: qty 1

## 2012-12-20 NOTE — Progress Notes (Signed)
3 Days Post-Op  Subjective: Vomited after eating yesterday.  Nauseated after breakfast today but did not vomit.  No flatus or BM.  Objective: Vital signs in last 24 hours: Temp:  [97.6 F (36.4 C)-98.7 F (37.1 C)] 97.6 F (36.4 C) (04/26 0508) Pulse Rate:  [58-68] 58 (04/26 0508) Resp:  [16-18] 18 (04/26 0508) BP: (111-128)/(50-67) 128/63 mmHg (04/26 0508) SpO2:  [97 %-100 %] 99 % (04/26 0508) Last BM Date: 12/17/12  Intake/Output from previous day: 04/25 0701 - 04/26 0700 In: 600 [P.O.:600] Out: 700 [Urine:700] Intake/Output this shift:    PE: General- In NAD Abdomen-soft, incisions are clean and intact, few bowel sounds present  Lab Results:   Recent Labs  12/18/12 0530  WBC 7.4  HGB 11.8*  HCT 34.9*  PLT 162   BMET  Recent Labs  12/18/12 0530  NA 141  K 3.9  CL 111  CO2 22  GLUCOSE 105*  BUN 11  CREATININE 0.83  CALCIUM 8.7   PT/INR No results found for this basename: LABPROT, INR,  in the last 72 hours Comprehensive Metabolic Panel:    Component Value Date/Time   NA 141 12/18/2012 0530   K 3.9 12/18/2012 0530   CL 111 12/18/2012 0530   CO2 22 12/18/2012 0530   BUN 11 12/18/2012 0530   CREATININE 0.83 12/18/2012 0530   GLUCOSE 105* 12/18/2012 0530   CALCIUM 8.7 12/18/2012 0530   AST 16 11/18/2012 1313   ALT 19 11/18/2012 1313   ALKPHOS 111 11/18/2012 1313   BILITOT 0.3 11/18/2012 1313   PROT 8.1 11/18/2012 1313   ALBUMIN 3.9 11/18/2012 1313     Studies/Results: No results found.  Anti-infectives: Anti-infectives   Start     Dose/Rate Route Frequency Ordered Stop   12/17/12 0941  ciprofloxacin (CIPRO) 400 MG/200ML IVPB    Comments:  BERRY, [name]: cabinet override      12/17/12 0941 12/17/12 2144   12/17/12 0600  ciprofloxacin (CIPRO) IVPB 400 mg     400 mg 200 mL/hr over 60 Minutes Intravenous On call to O.R. 12/16/12 1427 12/17/12 0945      Assessment Active Problems:  Laparoscopic incisional hernia repair with mesh 12/17/12-has a mild  ileus.    LOS: 3 days   Plan: Dulcolax suppository.  If she improves may be able to go home later today or tomorrow.   [name] [name] J 12/20/2012

## 2012-12-21 ENCOUNTER — Encounter (HOSPITAL_COMMUNITY): Payer: Self-pay | Admitting: Surgery

## 2012-12-21 DIAGNOSIS — E66813 Obesity, class 3: Secondary | ICD-10-CM

## 2012-12-21 DIAGNOSIS — K219 Gastro-esophageal reflux disease without esophagitis: Secondary | ICD-10-CM | POA: Insufficient documentation

## 2012-12-21 HISTORY — DX: Morbid (severe) obesity due to excess calories: E66.01

## 2012-12-21 HISTORY — DX: Obesity, class 3: E66.813

## 2012-12-21 MED ORDER — OXYCODONE HCL 5 MG PO TABS: 5.0000 mg | ORAL_TABLET | ORAL | Status: DC | PRN

## 2012-12-21 MED ORDER — BISACODYL 10 MG RE SUPP: 10.0000 mg | Freq: Every day | RECTAL | Status: DC | PRN

## 2012-12-21 MED ORDER — SUMATRIPTAN SUCCINATE 100 MG PO TABS
100.0000 mg | ORAL_TABLET | ORAL | Status: DC | PRN
  Filled 2012-12-21: qty 1

## 2012-12-21 MED ORDER — NAPROXEN 500 MG PO TABS
500.0000 mg | ORAL_TABLET | Freq: Two times a day (BID) | ORAL | Status: DC
  Filled 2012-12-21 (×2): qty 1

## 2012-12-21 MED ORDER — MAGNESIUM HYDROXIDE 400 MG/5ML PO SUSP: 30.0000 mL | Freq: Two times a day (BID) | ORAL | Status: DC | PRN

## 2012-12-21 MED ORDER — POLYETHYLENE GLYCOL 3350 17 G PO PACK
17.0000 g | PACK | Freq: Two times a day (BID) | ORAL | Status: DC
  Administered 2012-12-21: 17 g via ORAL
  Filled 2012-12-21: qty 1

## 2012-12-21 MED ORDER — NAPROXEN 500 MG PO TABS: 500.0000 mg | ORAL_TABLET | Freq: Two times a day (BID) | ORAL | Status: DC

## 2012-12-21 NOTE — Discharge Summary (Signed)
Physician Discharge Summary  Patient ID: [name] [name] MRN: [id number] DOB/AGE: [birth year] 46 y.o.  Admit date: 12/17/2012 Discharge date: 12/21/2012  Admission Diagnoses: Principal Problem:   Incisional hernia, without obstruction or gangrene Active Problems:   Obesity, Class III, BMI 40-49.9 (morbid obesity)  Discharge Diagnoses:  Principal Problem:   Incisional hernia, without obstruction or gangrene Active Problems:   Obesity, Class III, BMI 40-49.9 (morbid obesity)   Discharged Condition: good  Hospital Course: Morbidly obese patient with incisional hernia.  Underwent laparoscopic repair.  Postoperatively, the patient  patient mobilized and advanced to a solid diet gradually.  Struggled with some constipation and nausea at first.  Ileus gradually resolved.  Required IV pain medications initially.  At d/c, pain was well-controlled and transitioned off IV medications.    By the time of discharge, the patient was walking well the hallways, eating food well, having flatus.  Pain was-controlled on an oral regimen.  Based on meeting DC criteria and recovering well, I felt it was safe for the patient to be discharged home with close followup.  Instructions were discussed in detail.  They are written as well.   Consults: None  Significant Diagnostic Studies:   Treatments:  POSTOPERATIVE DIAGNOSIS:  1. Incisional hernia.  2. Peritoneal cyst.  PROCEDURE:  1. Laparoscopic incisional hernia repair with mesh (20 cm x 25 cm  physio mesh).  2. Excision of peritoneal cyst.  SURGEON: Abigail Miyamoto, M.D.   Discharge Exam: Blood pressure 111/64, pulse 75, temperature 98.1 F (36.7 C), temperature source Oral, resp. rate 18, height 5\' 3"  (1.6 m), weight 272 lb 0.8 oz (123.4 kg), SpO2 93.00%.  General: Pt awake/alert/oriented x4 in no major acute distress Eyes: PERRL, normal EOM. Sclera nonicteric Neuro: CN II-XII intact w/o focal sensory/motor deficits. Lymph: No head/neck/groin  lymphadenopathy Psych:  No delerium/psychosis/paranoia HENT: Normocephalic, Mucus membranes moist.  No thrush Neck: Supple, No tracheal deviation Chest: No pain.  Good respiratory excursion. CV:  Pulses intact.  Regular rhythm MS: Normal AROM mjr joints.  No obvious deformity Abdomen: Soft, Nondistended.  Min tender at incisions only.  No incarcerated hernias.  Morbidly obese.  Clean under panniculus Ext:  SCDs BLE.  No significant edema.  No cyanosis Skin: No petechiae / purpura   Disposition: 01-Home or Self Care  Discharge Orders   Future Appointments Provider Department Dept Phone   01/02/2013 1:40 PM Shelly Rubenstein, MD [facility], [place] [phone]   Future Orders Complete By Expires     Call MD for:  extreme fatigue  As directed     Call MD for:  hives  As directed     Call MD for:  persistant nausea and vomiting  As directed     Call MD for:  redness, tenderness, or signs of infection (pain, swelling, redness, odor or green/yellow discharge around incision site)  As directed     Call MD for:  severe uncontrolled pain  As directed     Call MD for:  As directed     Comments:      Temperature > 101.26F    Diet - low sodium heart healthy  As directed     Discharge instructions  As directed     Comments:      Please see discharge instruction sheets.  Also refer to handout given an office.  Please call our office if you have any questions or concerns [phone]    Discharge wound care:  As directed  Comments:      If you have closed incisions, shower and bathe over these incisions with soap and water every day.  You do not need to replace dressings over the closed incisions unless you feel more comfortable with a Band-Aid covering it.   Please call our office [phone] if you have further questions.    Driving Restrictions  As directed     Comments:      No driving until off narcotics and can safely swerve away without pain during an emergency    Increase  activity slowly  As directed     Comments:      Walk an hour a day.  Use 20-30 minute walks.  When you can walk 30 minutes without difficulty, increase to low impact/moderate activities such as biking, jogging, swimming, sexual activity..  Eventually can increase to unrestricted activity when not feeling pain.  If you feel pain: STOP!Marland Kitchen   Let pain protect you from overdoing it.  Use ice/heat/over-the-counter pain medications to help minimize his soreness.  Use pain prescriptions as needed to remain active.  It is better to take extra pain medications and be more active than to stay bedridden to avoid all pain medications.    Lifting restrictions  As directed     Comments:      Avoid heavy lifting initially.  Do not push through pain.  You have no specific weight limit.  Coughing and sneezing or four more stressful to your incision than any lifting you will do. Pain will protect you from injury.  Therefore, avoid intense activity until off all narcotic pain medications.  Coughing and sneezing or four more stressful to your incision than any lifting he will do.    May shower / Bathe  As directed     May walk up steps  As directed     Sexual Activity Restrictions  As directed     Comments:      Sexual activity as tolerated.  Do not push through pain.  Pain will protect you from injury.    Walk with assistance  As directed     Comments:      Walk over an hour a day.  May use a walker/cane/companion to help with balance and stamina.        Medication List    TAKE these medications       albuterol 108 (90 BASE) MCG/ACT inhaler  Commonly known as:  PROVENTIL HFA;VENTOLIN HFA  Inhale 2 puffs into the lungs every 6 (six) hours as needed for wheezing.     clonazePAM 1 MG tablet  Commonly known as:  KLONOPIN  Take 1 mg by mouth daily as needed (onset of seizure or sleep).     estradiol 2 MG vaginal ring  Commonly known as:  ESTRING  Place 2 mg vaginally every 3 (three) months. follow package  directions     lamoTRIgine 100 MG tablet  Commonly known as:  LAMICTAL  Take 100 mg by mouth 2 (two) times daily.     naproxen 500 MG tablet  Commonly known as:  NAPROSYN  Take 1 tablet (500 mg total) by mouth 2 (two) times daily with a meal.     omeprazole 20 MG capsule  Commonly known as:  PRILOSEC  Take 1 capsule (20 mg total) by mouth daily.     ondansetron 4 MG tablet  Commonly known as:  ZOFRAN  Take 1 tablet (4 mg total) by mouth every 8 (eight) hours as  needed for nausea.     oxyCODONE 5 MG immediate release tablet  Commonly known as:  Oxy IR/ROXICODONE  Take 1-3 tablets (5-15 mg total) by mouth every 4 (four) hours as needed.     SUMAtriptan 100 MG tablet  Commonly known as:  IMITREX  Take 100 mg by mouth every 2 (two) hours as needed for migraine. Max 2 doses per 24 hours     topiramate 100 MG tablet  Commonly known as:  TOPAMAX  Take 100 mg by mouth 2 (two) times daily.           Follow-up Information   Follow up with [facility], MD. Schedule an appointment as soon as possible for a visit in 2 weeks.   Contact information:   [address] [place] [address] [phone]       Signed: Ardeth Sportsman. 12/21/2012, 10:54 AM

## 2012-12-21 NOTE — Progress Notes (Signed)
Rx given for Oxycodone and explained.  All discharge instructions reviewed and copy given.  FU appts reviewed with pt.

## 2012-12-23 ENCOUNTER — Telehealth (INDEPENDENT_AMBULATORY_CARE_PROVIDER_SITE_OTHER): Payer: Self-pay

## 2012-12-23 NOTE — Telephone Encounter (Signed)
The pt called reporting she hasn't had a bm in 9 days.  She had a hernia repair and was discharged on Sunday.  She is drinking plenty of liquids and passing some gas.  She is nauseated.  She is taking Hydrocodone 5 mg but it isn't helping the pain.  Her lack of bowel movement is making the pain worse.  She has taken Miralax once a day.  She said she had trouble preop and saw Dr Jarold Motto for it.  I will page Dr Magnus Ivan.  He advised she can do an enema or go to the hospital.  I called the pt and she doesn't feel she can do an enema herself and wants to go to the hospital.  I paged Dr Magnus Ivan for directions.  He advised she should go through the ER at [facility].  They will contact him if needed.   I notified the pt.

## 2013-01-02 ENCOUNTER — Encounter (INDEPENDENT_AMBULATORY_CARE_PROVIDER_SITE_OTHER): Payer: Self-pay | Admitting: Surgery

## 2013-01-02 ENCOUNTER — Ambulatory Visit (INDEPENDENT_AMBULATORY_CARE_PROVIDER_SITE_OTHER): Payer: BC Managed Care – PPO | Admitting: Surgery

## 2013-01-02 VITALS — BP 128/78 | HR 72 | Temp 98.5°F | Resp 14 | Ht 63.0 in | Wt 264.0 lb

## 2013-01-02 DIAGNOSIS — Z09 Encounter for follow-up examination after completed treatment for conditions other than malignant neoplasm: Secondary | ICD-10-CM

## 2013-01-02 NOTE — Progress Notes (Signed)
Subjective:     Patient ID: [name] [name], female   DOB: 1965/08/29, 47 y.o.   MRN: [id number]  HPI She is here for her first postop visit status post left-sided incisional hernia repair with mesh. She is having mild to moderate discomfort and some mild constipation.  Review of Systems     Objective:   Physical Exam On exam, her incisions are healing well and there is no evidence of recurrent hernia    Assessment:     Patient stable postop     Plan:     She will slowly increase her activity. She may return to work on May 26. I renewed for Percocet. I will see her back as needed

## 2013-01-30 ENCOUNTER — Encounter (INDEPENDENT_AMBULATORY_CARE_PROVIDER_SITE_OTHER): Payer: Self-pay | Admitting: Surgery

## 2013-01-30 ENCOUNTER — Telehealth (INDEPENDENT_AMBULATORY_CARE_PROVIDER_SITE_OTHER): Payer: Self-pay

## 2013-01-30 ENCOUNTER — Other Ambulatory Visit (INDEPENDENT_AMBULATORY_CARE_PROVIDER_SITE_OTHER): Payer: Self-pay | Admitting: Surgery

## 2013-01-30 ENCOUNTER — Ambulatory Visit (INDEPENDENT_AMBULATORY_CARE_PROVIDER_SITE_OTHER): Payer: BC Managed Care – PPO | Admitting: Surgery

## 2013-01-30 VITALS — BP 119/76 | HR 72 | Temp 96.9°F | Resp 12 | Ht 62.5 in | Wt 265.0 lb

## 2013-01-30 DIAGNOSIS — R109 Unspecified abdominal pain: Secondary | ICD-10-CM

## 2013-01-30 DIAGNOSIS — Z09 Encounter for follow-up examination after completed treatment for conditions other than malignant neoplasm: Secondary | ICD-10-CM

## 2013-01-30 NOTE — Telephone Encounter (Signed)
Pt called stating she has increasing pain at hernia repair site. She states Dr Magnus Ivan told her to come by office if she had any problem with hernia site. Pt given appt for today at 1:30pm.

## 2013-01-30 NOTE — Progress Notes (Signed)
Subjective:     Patient ID: [name] [name], female   DOB: 1966/02/20, 47 y.o.   MRN: [id number]  HPI She is status post laparoscopic incisional hernia repair mesh back in April. She was doing well until about a week and a half ago when she started having lower abdominal pain and pain hurting and her right side. She has slight constipation and mild nausea. The pain is constant and sharp. It is slightly relieved with hydrocodone  Review of Systems     Objective:   Physical Exam On exam, she is well and appearance. Her vital signs are stable. Her abdomen is soft and obese. It is slightly tender across the lower abdomen. I cannot feel a recurrent hernia although it is difficult to say given her obesity. At the time of surgery I removed a peritoneal cyst on the abdominal wall    Assessment:     Patient with postoperative abdominal pain     Plan:     Because of recurrent pain and tenderness as well as the previous finding of the peritoneal cyst, I believe a CAT scan of the abdomen and pelvis with CAT scan is warranted to rule out recurrent hernia or some kind of postoperative complication. We will call her back with the results of the scan

## 2013-02-02 ENCOUNTER — Ambulatory Visit
Admission: RE | Admit: 2013-02-02 | Discharge: 2013-02-02 | Disposition: A | Discharge status: Home / Self Care | Payer: BC Managed Care – PPO | Source: Ambulatory Visit | Attending: Surgery | Admitting: Surgery

## 2013-02-02 DIAGNOSIS — R109 Unspecified abdominal pain: Secondary | ICD-10-CM

## 2013-02-02 MED ORDER — IOHEXOL 300 MG/ML  SOLN
125.0000 mL | Freq: Once | INTRAMUSCULAR | Status: AC | PRN
  Administered 2013-02-02: 125 mL via INTRAVENOUS

## 2013-04-20 ENCOUNTER — Other Ambulatory Visit: Payer: Self-pay | Admitting: Neurology

## 2013-04-23 ENCOUNTER — Other Ambulatory Visit: Payer: Self-pay

## 2013-04-23 MED ORDER — ESZOPICLONE 2 MG PO TABS: 2.0000 mg | ORAL_TABLET | Freq: Every evening | ORAL | Status: DC | PRN

## 2013-04-23 MED ORDER — CLONAZEPAM 1 MG PO TABS: 0.5000 mg | ORAL_TABLET | Freq: Every evening | ORAL | Status: DC | PRN

## 2013-04-23 NOTE — Telephone Encounter (Signed)
Pharmacy is requesting refills.  Patient has an appt in Oct.  We have not prescribed either of these medications since 2013.  Patient only takes meds when needed.

## 2013-04-23 NOTE — Telephone Encounter (Signed)
Rx signed and faxed.

## 2013-05-13 ENCOUNTER — Encounter: Payer: Self-pay | Admitting: Obstetrics & Gynecology

## 2013-05-13 ENCOUNTER — Ambulatory Visit (INDEPENDENT_AMBULATORY_CARE_PROVIDER_SITE_OTHER): Payer: BC Managed Care – PPO | Admitting: Obstetrics & Gynecology

## 2013-05-13 VITALS — BP 137/81 | HR 79 | Temp 98.3°F | Ht 62.5 in | Wt 275.0 lb

## 2013-05-13 DIAGNOSIS — N760 Acute vaginitis: Secondary | ICD-10-CM

## 2013-05-13 DIAGNOSIS — N644 Mastodynia: Secondary | ICD-10-CM

## 2013-05-13 NOTE — Progress Notes (Signed)
  Subjective:    Patient ID: [name] [name], female    DOB: [birth year], 46 y.o.   MRN: [id number]  HPI    Review of Systems     Objective:   Physical Exam  Pulmonary/Chest:            Assessment & Plan:

## 2013-05-13 NOTE — Patient Instructions (Signed)
Costochondritis Costochondritis (Tietze syndrome), or costochondral separation, is a swelling and irritation (inflammation) of the tissue (cartilage) that connects your ribs with your breastbone (sternum). It may occur on its own (spontaneously), through damage caused by an accident (trauma), or simply from coughing or minor exercise. It may take up to 6 weeks to get better and longer if you are unable to be conservative in your activities. HOME CARE INSTRUCTIONS   Avoid exhausting physical activity. Try not to strain your ribs during normal activity. This would include any activities using chest, belly (abdominal), and side muscles, especially if heavy weights are used.  Use ice for 15-20 minutes per hour while awake for the first 2 days. Place the ice in a plastic bag, and place a towel between the bag of ice and your skin.  Only take over-the-counter or prescription medicines for pain, discomfort, or fever as directed by your caregiver. SEEK IMMEDIATE MEDICAL CARE IF:   Your pain increases or you are very uncomfortable.  You have a fever.  You develop difficulty with your breathing.  You cough up blood.  You develop worse chest pains, shortness of breath, sweating, or vomiting.  You develop new, unexplained problems (symptoms). MAKE SURE YOU:   Understand these instructions.  Will watch your condition.  Will get help right away if you are not doing well or get worse. Document Released: 05/23/2005 Document Revised: 11/05/2011 Document Reviewed: 03/31/2008 Exit[employer] Patient Information 2014 [employer].  

## 2013-05-13 NOTE — Progress Notes (Signed)
.   Subjective:     [name] [name] is a 47 y.o. female here for a problem exam.  Current complaints: Left breast pain for one week.  Denies any discharge from nipple, redness, or bumps/mass.  Was sized for her bra one month ago.  Vaginal discharge with itching for about 4 days.  Denies use of any OTC medication.  Personal health questionnaire reviewed: no.   Gynecologic History No LMP recorded. Patient has had a hysterectomy. 2005.  Pelvic pain/fibroids. Contraception: none Last Pap: 2012. Results were: normal Last mammogram: 2013. Results were: normal  Obstetric History OB History  No data available     The following portions of the patient's history were reviewed and updated as appropriate: allergies, current medications, past family history, past medical history, past social history, past surgical history and problem list.  Review of Systems Pertinent items are noted in HPI.    Objective:  Nodes: negative supraclavicular/axillary lymphadenopathy   Assessment:   Mastodynia vs costochondritis Plan:   Screening MMG Supportive measures Return in a few months

## 2013-05-14 ENCOUNTER — Other Ambulatory Visit: Payer: Self-pay | Admitting: *Deleted

## 2013-05-14 MED ORDER — FLUCONAZOLE 150 MG PO TABS: 150.0000 mg | ORAL_TABLET | ORAL | Status: DC

## 2013-05-14 NOTE — Progress Notes (Signed)
Patient called to advised that her prescription was not sent to the pharmacy yesterday after her appointment. Diflucan 150mg  was sent to [facility].

## 2013-05-14 NOTE — Addendum Note (Signed)
Addended by: Glendell Docker on: 05/14/2013 09:23 AM   Modules accepted: Orders

## 2013-05-15 LAB — WET PREP BY MOLECULAR PROBE
Gardnerella vaginalis: NEGATIVE
Trichomonas vaginosis: NEGATIVE

## 2013-05-19 ENCOUNTER — Ambulatory Visit: Payer: Self-pay | Admitting: Obstetrics & Gynecology

## 2013-06-09 ENCOUNTER — Ambulatory Visit (INDEPENDENT_AMBULATORY_CARE_PROVIDER_SITE_OTHER): Payer: BC Managed Care – PPO | Admitting: Nurse Practitioner

## 2013-06-09 ENCOUNTER — Encounter: Payer: Self-pay | Admitting: Nurse Practitioner

## 2013-06-09 VITALS — BP 129/80 | HR 73 | Ht 65.0 in | Wt 274.0 lb

## 2013-06-09 DIAGNOSIS — G43009 Migraine without aura, not intractable, without status migrainosus: Secondary | ICD-10-CM

## 2013-06-09 DIAGNOSIS — G40309 Generalized idiopathic epilepsy and epileptic syndromes, not intractable, without status epilepticus: Secondary | ICD-10-CM

## 2013-06-09 MED ORDER — LAMOTRIGINE 100 MG PO TABS: 100.0000 mg | ORAL_TABLET | Freq: Two times a day (BID) | ORAL | Status: DC

## 2013-06-09 MED ORDER — ESZOPICLONE 2 MG PO TABS: 2.0000 mg | ORAL_TABLET | Freq: Every evening | ORAL | Status: DC | PRN

## 2013-06-09 MED ORDER — CLONAZEPAM 1 MG PO TABS: 0.5000 mg | ORAL_TABLET | Freq: Every evening | ORAL | Status: DC | PRN

## 2013-06-09 MED ORDER — TOPIRAMATE 100 MG PO TABS: 100.0000 mg | ORAL_TABLET | Freq: Two times a day (BID) | ORAL | Status: DC

## 2013-06-09 MED ORDER — SUMATRIPTAN SUCCINATE 100 MG PO TABS: 100.0000 mg | ORAL_TABLET | ORAL | Status: DC | PRN

## 2013-06-09 NOTE — Patient Instructions (Signed)
Pt to continue Topamax Pt to continue Lamictal Pt to continue Lunesta Pt to continue Imitrex Consider sleep study

## 2013-06-09 NOTE — Progress Notes (Signed)
[facility]  PATIENT: [name] [name] DOB: 07/17/1966   REASON FOR VISIT: Followup for seizures and migraine headaches    HISTORY OF PRESENT ILLNESS:, [name] [name], 47 year old black female returns today for followup. She was last seen 07/10/2012.  She has a history of migraines and she also has a seizure disorder. It has been several years since she had any seizure activity. Her headaches have been in good control recently. She has 2-3 per month she uses Imitrex acutely . She has tried Zomig but had side effects.  Treximet was not covered by insurance.  She is currently on Topamax b.i.d.  She also has a history of asthma, and obesity. She says she is trying to walk 30 minutes several times a week. Has a lot of stress.She continues to gain weight.  I questioned her about daytime drowsiness, morning headaches and possibility of sleep apnea.   REVIEW OF SYSTEMS: Full 14 system review of systems performed and notable only for:  Constitutional: N/A  Cardiovascular: N/A  Ear/Nose/Throat: N/A  Skin: N/A  Eyes: N/A  Respiratory: N/A  Gastroitestinal: N/A  Hematology/Lymphatic: N/A  Endocrine: N/A Musculoskeletal:N/A  Allergy/Immunology: N/A  Neurological: N/A Psychiatric: N/A   ALLERGIES: Allergies  Allergen Reactions  . Amoxicillin Hives and Swelling  . Biaxin [Clarithromycin] Hives  . Erythromycin Base Hives  . Penicillins Hives and Swelling  . Zithromax [Azithromycin] Hives and Nausea Only    HOME MEDICATIONS: Outpatient Prescriptions Prior to Visit  Medication Sig Dispense Refill  . albuterol (PROVENTIL HFA;VENTOLIN HFA) 108 (90 BASE) MCG/ACT inhaler Inhale 2 puffs into the lungs every 6 (six) hours as needed for wheezing.      . clonazePAM (KLONOPIN) 1 MG tablet Take 0.5 tablets (0.5 mg total) by mouth at bedtime as needed.  15 tablet  0  . estradiol (ESTRING) 2 MG vaginal ring Place 2 mg vaginally every 3 (three) months. follow package directions      .  eszopiclone (LUNESTA) 2 MG TABS tablet Take 1 tablet (2 mg total) by mouth at bedtime as needed.  30 tablet  0  . fluconazole (DIFLUCAN) 150 MG tablet Take 1 tablet (150 mg total) by mouth every other day.  2 tablet  0  . lamoTRIgine (LAMICTAL) 100 MG tablet Take 100 mg by mouth 2 (two) times daily.       . naproxen (NAPROSYN) 500 MG tablet Take 1 tablet (500 mg total) by mouth 2 (two) times daily with a meal.  40 tablet  1  . omeprazole (PRILOSEC) 20 MG capsule Take 1 capsule (20 mg total) by mouth daily.  30 capsule  1  . ondansetron (ZOFRAN) 4 MG tablet Take 1 tablet (4 mg total) by mouth every 8 (eight) hours as needed for nausea.  20 tablet  0  . oxyCODONE (OXY IR/ROXICODONE) 5 MG immediate release tablet Take 1-3 tablets (5-15 mg total) by mouth every 4 (four) hours as needed.  50 tablet  0  . SUMAtriptan (IMITREX) 100 MG tablet Take 100 mg by mouth every 2 (two) hours as needed for migraine. Max 2 doses per 24 hours      . topiramate (TOPAMAX) 100 MG tablet Take 100 mg by mouth 2 (two) times daily.       No facility-administered medications prior to visit.    PAST MEDICAL HISTORY: Past Medical History  Diagnosis Date  . Migraine   . Seizures   . Asthma   . GERD (gastroesophageal reflux disease)   .  Obesity, Class III, BMI 40-49.9 (morbid obesity) 12/21/2012    PAST SURGICAL HISTORY: Past Surgical History  Procedure Laterality Date  . Abdominal hysterectomy    . Cesarean section    . Esophagogastroduodenoscopy N/A 11/21/2012    Procedure: ESOPHAGOGASTRODUODENOSCOPY (EGD);  Surgeon: Hilarie Fredrickson, MD;  Location: [facility];  Service: Endoscopy;  Laterality: N/A;  . Colonoscopy N/A 11/21/2012    Procedure: COLONOSCOPY;  Surgeon: Hilarie Fredrickson, MD;  Location: [facility];  Service: Endoscopy;  Laterality: N/A;  . Dilation and curettage of uterus    . Hernia repair  12/17/12    laparoscopic incisional   . Incisional hernia repair N/A 12/17/2012    Procedure: LAPAROSCOPIC INCISIONAL  HERNIA;  Surgeon: Shelly Rubenstein, MD;  Location: [facility] OR;  Service: General;  Laterality: N/A;  . Insertion of mesh N/A 12/17/2012    Procedure: INSERTION OF MESH;  Surgeon: Shelly Rubenstein, MD;  Location: [facility] OR;  Service: General;  Laterality: N/A;    FAMILY HISTORY: Family History  Problem Relation Age of Onset  . Hypertension Mother   . Depression Mother   . Heart attack Father   . Hypertension Father   . Emphysema Father   . COPD Father   . Hypertension Sister     SOCIAL HISTORY: History   Social History  . Marital Status: Single    Spouse Name: N/A    Number of Children: 2  . Years of Education: N/A   Occupational History  . administrative assistance    Social History Main Topics  . Smoking status: Never Smoker   . Smokeless tobacco: Never Used  . Alcohol Use: Yes     Comment: occ glass of wine  . Drug Use: No  . Sexual Activity: Yes    Birth Control/ Protection: Surgical   Other Topics Concern  . Not on file   Social History Narrative  . No narrative on file     PHYSICAL EXAM  Filed Vitals:   06/09/13 1503  BP: 129/80  Pulse: 73  Height: 5\' 5"  (1.651 m)  Weight: 274 lb (124.286 kg)   Body mass index is 45.6 kg/(m^2).  Generalized: Well developed, morbidly obese female in no acute distress  Head: normocephalic and atraumatic,. Oropharynx benign  Neck: Supple, no carotid bruits  Cardiac: Regular rate rhythm, no murmur  Musculoskeletal: No deformity   Neurological examination   Mentation: Alert oriented to time, place, history taking. Follows all commands speech and language fluent  Cranial nerve II-XII:.Pupils were equal round reactive to light extraocular movements were full, visual field were full on confrontational test. Facial sensation and strength were normal. hearing was intact to finger rubbing bilaterally. Uvula tongue midline. head turning and shoulder shrug and were normal and symmetric.Tongue protrusion into cheek strength was  normal. Motor: normal bulk and tone, full strength in the BUE, BLE, fine finger movements normal, no pronator drift. No focal weakness Sensory: normal and symmetric to light touch, pinprick, and  vibration  Coordination: finger-nose-finger, heel-to-shin bilaterally, no dysmetria Reflexes: Brachioradialis 2/2, biceps 2/2, triceps 2/2, patellar 2/2, Achilles 2/2, plantar responses were flexor bilaterally. Gait and Station: Rising up from seated position without assistance, normal stance, without trunk ataxia, moderate stride, good arm swing, smooth turning, able to perform tiptoe, and heel walking without difficulty.   DIAGNOSTIC DATA (LABS, IMAGING, TESTING) - I reviewed patient records, labs, notes, testing and imaging myself where available.  Lab Results  Component Value Date   WBC 7.4 12/18/2012  HGB 11.8* 12/18/2012   HCT 34.9* 12/18/2012   MCV 82.1 12/18/2012   PLT 162 12/18/2012      Component Value Date/Time   NA 141 12/18/2012 0530   K 3.9 12/18/2012 0530   CL 111 12/18/2012 0530   CO2 22 12/18/2012 0530   GLUCOSE 105* 12/18/2012 0530   BUN 11 12/18/2012 0530   CREATININE 0.83 12/18/2012 0530   CALCIUM 8.7 12/18/2012 0530   PROT 8.1 11/18/2012 1313   ALBUMIN 3.9 11/18/2012 1313   AST 16 11/18/2012 1313   ALT 19 11/18/2012 1313   ALKPHOS 111 11/18/2012 1313   BILITOT 0.3 11/18/2012 1313   GFRNONAA 84* 12/18/2012 0530   GFRAA >90 12/18/2012 0530          ASSESSMENT AND PLAN  47 y.o. year old female  has a past medical history of Migraine; Seizures; Asthma; GERD (gastroesophageal reflux disease); and Obesity, Class III, BMI 40-49.9 (morbid obesity) (12/21/2012). here to followup. No seizure activity in several years, migraines in fairly good control  Pt to continue Topamax Pt to continue Lamictal Pt to continue Lunesta Pt to continue Imitrex Consider sleep study  Nilda Riggs, [facility], [facility], APRN  [facility] [address] [place], [place]  [address] [phone]

## 2013-06-16 ENCOUNTER — Encounter: Payer: Self-pay | Admitting: Obstetrics & Gynecology

## 2013-06-22 ENCOUNTER — Encounter: Payer: Self-pay | Admitting: Obstetrics & Gynecology

## 2013-06-22 ENCOUNTER — Encounter: Payer: Self-pay | Admitting: Obstetrics

## 2013-06-22 ENCOUNTER — Ambulatory Visit (INDEPENDENT_AMBULATORY_CARE_PROVIDER_SITE_OTHER): Payer: BC Managed Care – PPO | Admitting: Obstetrics & Gynecology

## 2013-06-22 VITALS — BP 127/81 | HR 67 | Temp 97.5°F | Ht 62.0 in | Wt 270.0 lb

## 2013-06-22 DIAGNOSIS — Z01419 Encounter for gynecological examination (general) (routine) without abnormal findings: Secondary | ICD-10-CM

## 2013-06-22 DIAGNOSIS — Z113 Encounter for screening for infections with a predominantly sexual mode of transmission: Secondary | ICD-10-CM

## 2013-06-22 LAB — CBC
HCT: 40.3 % (ref 36.0–46.0)
MCHC: 33.5 g/dL (ref 30.0–36.0)
MCV: 81.6 fL (ref 78.0–100.0)
Platelets: 225 10*3/uL (ref 150–400)
RDW: 15.5 % (ref 11.5–15.5)
WBC: 6 10*3/uL (ref 4.0–10.5)

## 2013-06-22 LAB — HEMOGLOBIN A1C
Hgb A1c MFr Bld: 5.7 % — ABNORMAL HIGH (ref <5.7)
Mean Plasma Glucose: 117 mg/dL — ABNORMAL HIGH (ref <117)

## 2013-06-22 NOTE — Patient Instructions (Signed)

## 2013-06-22 NOTE — Progress Notes (Signed)
Subjective:     [name] [name] is a 47 y.o. female here for a routine exam.  Current complaints: annual exam. Pt states she has no concerns at this time.  Personal health questionnaire reviewed: yes.   Gynecologic History No LMP recorded. Patient has had a hysterectomy. Contraception: status post hysterectomy Last Pap: 2012. Results were: normal Last mammogram: 2014. Results were: normal  Obstetric History OB History  No data available     The following portions of the patient's history were reviewed and updated as appropriate: allergies, current medications, past family history, past medical history, past social history, past surgical history and problem list.  Review of Systems Pertinent items are noted in HPI.    Objective:      General appearance: alert Breasts: normal appearance, no masses or tenderness Abdomen: soft, non-tender; bowel sounds normal; no masses,  no organomegaly Pelvic: external genitalia normal, no adnexal masses or tenderness and vagina normal without discharge       Assessment:    Healthy female exam.    Plan:   Considering transdermal ERT Return prn

## 2013-06-23 LAB — COMPREHENSIVE METABOLIC PANEL
ALT: 17 U/L (ref 0–35)
AST: 17 U/L (ref 0–37)
Alkaline Phosphatase: 112 U/L (ref 39–117)
BUN: 14 mg/dL (ref 6–23)
Creat: 0.82 mg/dL (ref 0.50–1.10)
Potassium: 4.1 mEq/L (ref 3.5–5.3)
Total Bilirubin: 0.5 mg/dL (ref 0.3–1.2)
Total Protein: 7.7 g/dL (ref 6.0–8.3)

## 2013-06-23 LAB — HIV ANTIBODY (ROUTINE TESTING W REFLEX): HIV: NONREACTIVE

## 2013-06-23 LAB — RPR

## 2013-06-23 LAB — TSH: TSH: 0.752 u[IU]/mL (ref 0.350–4.500)

## 2013-06-23 LAB — HDL CHOLESTEROL: HDL: 48 mg/dL (ref >39)

## 2013-06-25 ENCOUNTER — Encounter: Payer: Self-pay | Admitting: Obstetrics & Gynecology

## 2013-06-30 ENCOUNTER — Encounter: Payer: Self-pay | Admitting: Obstetrics

## 2013-07-02 ENCOUNTER — Other Ambulatory Visit: Payer: Self-pay

## 2013-07-15 ENCOUNTER — Ambulatory Visit: Payer: BC Managed Care – PPO | Admitting: Obstetrics & Gynecology

## 2013-08-04 ENCOUNTER — Emergency Department (HOSPITAL_COMMUNITY)
Admission: EM | Admit: 2013-08-04 | Discharge: 2013-08-04 | Disposition: A | Discharge status: Home / Self Care | Payer: BC Managed Care – PPO | Attending: Emergency Medicine | Admitting: Emergency Medicine

## 2013-08-04 ENCOUNTER — Encounter (HOSPITAL_COMMUNITY): Payer: Self-pay | Admitting: Emergency Medicine

## 2013-08-04 DIAGNOSIS — Z880 Allergy status to penicillin: Secondary | ICD-10-CM | POA: Insufficient documentation

## 2013-08-04 DIAGNOSIS — J45909 Unspecified asthma, uncomplicated: Secondary | ICD-10-CM | POA: Insufficient documentation

## 2013-08-04 DIAGNOSIS — G40909 Epilepsy, unspecified, not intractable, without status epilepticus: Secondary | ICD-10-CM | POA: Insufficient documentation

## 2013-08-04 DIAGNOSIS — R05 Cough: Secondary | ICD-10-CM | POA: Insufficient documentation

## 2013-08-04 DIAGNOSIS — A088 Other specified intestinal infections: Secondary | ICD-10-CM | POA: Insufficient documentation

## 2013-08-04 DIAGNOSIS — R059 Cough, unspecified: Secondary | ICD-10-CM | POA: Insufficient documentation

## 2013-08-04 DIAGNOSIS — E669 Obesity, unspecified: Secondary | ICD-10-CM | POA: Insufficient documentation

## 2013-08-04 DIAGNOSIS — A084 Viral intestinal infection, unspecified: Secondary | ICD-10-CM

## 2013-08-04 DIAGNOSIS — H9209 Otalgia, unspecified ear: Secondary | ICD-10-CM | POA: Insufficient documentation

## 2013-08-04 DIAGNOSIS — Z8719 Personal history of other diseases of the digestive system: Secondary | ICD-10-CM | POA: Insufficient documentation

## 2013-08-04 DIAGNOSIS — G43909 Migraine, unspecified, not intractable, without status migrainosus: Secondary | ICD-10-CM | POA: Insufficient documentation

## 2013-08-04 DIAGNOSIS — Z79899 Other long term (current) drug therapy: Secondary | ICD-10-CM | POA: Insufficient documentation

## 2013-08-04 LAB — LIPASE, BLOOD: Lipase: 21 U/L (ref 11–59)

## 2013-08-04 LAB — URINALYSIS, ROUTINE W REFLEX MICROSCOPIC
Glucose, UA: NEGATIVE mg/dL
Ketones, ur: NEGATIVE mg/dL
Leukocytes, UA: NEGATIVE
Nitrite: NEGATIVE
Protein, ur: NEGATIVE mg/dL
Specific Gravity, Urine: 1.03 (ref 1.005–1.030)
Urobilinogen, UA: 0.2 mg/dL (ref 0.0–1.0)
pH: 5.5 (ref 5.0–8.0)

## 2013-08-04 LAB — COMPREHENSIVE METABOLIC PANEL
AST: 23 U/L (ref 0–37)
Albumin: 4.1 g/dL (ref 3.5–5.2)
Alkaline Phosphatase: 111 U/L (ref 39–117)
Calcium: 9.4 mg/dL (ref 8.4–10.5)
Chloride: 100 mEq/L (ref 96–112)
GFR calc non Af Amer: 68 mL/min — ABNORMAL LOW (ref >90)
Glucose, Bld: 94 mg/dL (ref 70–99)
Potassium: 3.2 mEq/L — ABNORMAL LOW (ref 3.5–5.1)
Sodium: 135 mEq/L (ref 135–145)
Total Bilirubin: 0.4 mg/dL (ref 0.3–1.2)
Total Protein: 8.7 g/dL — ABNORMAL HIGH (ref 6.0–8.3)

## 2013-08-04 LAB — CBC WITH DIFFERENTIAL/PLATELET
Basophils Absolute: 0 10*3/uL (ref 0.0–0.1)
Basophils Relative: 0 % (ref 0–1)
Eosinophils Absolute: 0 10*3/uL (ref 0.0–0.7)
Hemoglobin: 15 g/dL (ref 12.0–15.0)
MCH: 28.3 pg (ref 26.0–34.0)
MCHC: 35.1 g/dL (ref 30.0–36.0)
Neutro Abs: 2.8 10*3/uL (ref 1.7–7.7)
Neutrophils Relative %: 45 % (ref 43–77)
Platelets: 245 10*3/uL (ref 150–400)
RDW: 13.9 % (ref 11.5–15.5)
WBC: 6.2 10*3/uL (ref 4.0–10.5)

## 2013-08-04 MED ORDER — SODIUM CHLORIDE 0.9 % IV BOLUS (SEPSIS)
1000.0000 mL | Freq: Once | INTRAVENOUS | Status: AC
  Administered 2013-08-04: 1000 mL via INTRAVENOUS

## 2013-08-04 MED ORDER — PROMETHAZINE HCL 25 MG/ML IJ SOLN
25.0000 mg | Freq: Once | INTRAMUSCULAR | Status: AC
  Administered 2013-08-04: 25 mg via INTRAVENOUS
  Filled 2013-08-04: qty 1

## 2013-08-04 MED ORDER — ONDANSETRON HCL 4 MG/2ML IJ SOLN
4.0000 mg | Freq: Once | INTRAMUSCULAR | Status: AC
  Administered 2013-08-04: 4 mg via INTRAVENOUS
  Filled 2013-08-04: qty 2

## 2013-08-04 MED ORDER — PROMETHAZINE HCL 25 MG PO TABS: 25.0000 mg | ORAL_TABLET | Freq: Four times a day (QID) | ORAL | Status: DC | PRN

## 2013-08-04 MED ORDER — MORPHINE SULFATE 4 MG/ML IJ SOLN
4.0000 mg | Freq: Once | INTRAMUSCULAR | Status: AC
  Administered 2013-08-04: 4 mg via INTRAVENOUS
  Filled 2013-08-04: qty 1

## 2013-08-04 NOTE — ED Notes (Signed)
Pt from home c/o N/V/D, bilateral ear pain, productive cough with brownish, green sputum x3 days. Pt denies blood in stool. Pt is A&O and in NAD

## 2013-08-04 NOTE — ED Provider Notes (Signed)
CSN: [id number]     Arrival date & time 08/04/13  1725 History   First MD Initiated Contact with Patient 08/04/13 1749     Chief Complaint  Patient presents with  . Diarrhea  . Emesis  . Otalgia  . Cough   (Consider location/radiation/quality/duration/timing/severity/associated sxs/prior Treatment) HPI Comments: Patient presents to the ED with a chief complaint of n/v/d and cough.  She states that the symptoms began on Saturday.  They have been constant and persistent.  She has not tried taking anything to alleviate her symptoms.  She reports moderate, diffuse abdominal pain.  She also reports productive cough and urinary frequency.  Reports tmax of 102.3.  She denies any other problems.  Denies hematemesis or hematochezia.  Nothing makes her symptoms better or worse.  The history is provided by the patient. No language interpreter was used.    Past Medical History  Diagnosis Date  . Migraine   . Seizures   . Asthma   . GERD (gastroesophageal reflux disease)   . Obesity, Class III, BMI 40-49.9 (morbid obesity) 12/21/2012   Past Surgical History  Procedure Laterality Date  . Abdominal hysterectomy    . Cesarean section    . Esophagogastroduodenoscopy N/A 11/21/2012    Procedure: ESOPHAGOGASTRODUODENOSCOPY (EGD);  Surgeon: Hilarie Fredrickson, MD;  Location: [facility];  Service: Endoscopy;  Laterality: N/A;  . Colonoscopy N/A 11/21/2012    Procedure: COLONOSCOPY;  Surgeon: Hilarie Fredrickson, MD;  Location: [facility];  Service: Endoscopy;  Laterality: N/A;  . Dilation and curettage of uterus    . Hernia repair  12/17/12    laparoscopic incisional   . Incisional hernia repair N/A 12/17/2012    Procedure: LAPAROSCOPIC INCISIONAL HERNIA;  Surgeon: Shelly Rubenstein, MD;  Location: [facility];  Service: General;  Laterality: N/A;  . Insertion of mesh N/A 12/17/2012    Procedure: INSERTION OF MESH;  Surgeon: Shelly Rubenstein, MD;  Location: [facility];  Service: General;  Laterality: N/A;   Family  History  Problem Relation Age of Onset  . Hypertension Mother   . Depression Mother   . Heart attack Father   . Hypertension Father   . Emphysema Father   . COPD Father   . Hypertension Sister    History  Substance Use Topics  . Smoking status: Never Smoker   . Smokeless tobacco: Never Used  . Alcohol Use: Yes     Comment: occ glass of wine   OB History   Grav Para Term Preterm Abortions TAB SAB Ect Mult Living                 Review of Systems  All other systems reviewed and are negative.    Allergies  Amoxicillin; Biaxin; Erythromycin base; Penicillins; and Zithromax  Home Medications   Current Outpatient Rx  Name  Route  Sig  Dispense  Refill  . albuterol (PROVENTIL HFA;VENTOLIN HFA) 108 (90 BASE) MCG/ACT inhaler   Inhalation   Inhale 2 puffs into the lungs every 6 (six) hours as needed for wheezing.         . clonazePAM (KLONOPIN) 1 MG tablet   Oral   Take 0.5 tablets (0.5 mg total) by mouth at bedtime as needed.   15 tablet   0     Pharmacy Fax [phone]   . estradiol (ESTRING) 2 MG vaginal ring   Vaginal   Place 2 mg vaginally every 3 (three) months. follow package directions         .  eszopiclone (LUNESTA) 2 MG TABS tablet   Oral   Take 1 tablet (2 mg total) by mouth at bedtime as needed.   20 tablet   4     Pharmacy Fax [phone]   . lamoTRIgine (LAMICTAL) 100 MG tablet   Oral   Take 1 tablet (100 mg total) by mouth 2 (two) times daily.   60 tablet   11   . SUMAtriptan (IMITREX) 100 MG tablet   Oral   Take 1 tablet (100 mg total) by mouth every 2 (two) hours as needed for migraine. Max 2 doses per 24 hours   10 tablet   6   . topiramate (TOPAMAX) 100 MG tablet   Oral   Take 1 tablet (100 mg total) by mouth 2 (two) times daily.   60 tablet   6    BP 139/90  Pulse 108  Temp(Src) 98.8 F (37.1 C) (Oral)  Resp 16  SpO2 98% Physical Exam  Nursing note and vitals reviewed. Constitutional: She is oriented to person, place, and  time. She appears well-developed and well-nourished.  HENT:  Head: Normocephalic and atraumatic.  Right Ear: External ear normal.  Left Ear: External ear normal.  Nose: Nose normal.  Mouth/Throat: Oropharynx is clear and moist. No oropharyngeal exudate.  Oropharynx is clear, MM are a little dry, bilateral TMs are clear, some cerumen build up  Eyes: Conjunctivae and EOM are normal. Pupils are equal, round, and reactive to light.  Neck: Normal range of motion. Neck supple.  Cardiovascular: Normal rate and regular rhythm.  Exam reveals no gallop and no friction rub.   No murmur heard. Pulmonary/Chest: Effort normal and breath sounds normal. No respiratory distress. She has no wheezes. She has no rales. She exhibits no tenderness.  Abdominal: Soft. Bowel sounds are normal. She exhibits no distension and no mass. There is tenderness. There is no rebound and no guarding.  Abdomen more tender in upper left quadrant, no RLQ tenderness, no pain at McBurney's point, no Murphy's sign, no LLQ tenderness  Musculoskeletal: Normal range of motion. She exhibits no edema and no tenderness.  Neurological: She is alert and oriented to person, place, and time.  Skin: Skin is warm and dry.  Psychiatric: She has a normal mood and affect. Her behavior is normal. Judgment and thought content normal.    ED Course  Procedures (including critical care time) Results for orders placed during the hospital encounter of 08/04/13  CBC WITH DIFFERENTIAL      Result Value Range   WBC 6.2  4.0 - 10.5 K/uL   RBC 5.30 (*) 3.87 - 5.11 MIL/uL   Hemoglobin 15.0  12.0 - 15.0 g/dL   HCT 45.4  09.8 - 11.9 %   MCV 80.6  78.0 - 100.0 fL   MCH 28.3  26.0 - 34.0 pg   MCHC 35.1  30.0 - 36.0 g/dL   RDW 14.7  82.9 - 56.2 %   Platelets 245  150 - 400 K/uL   Neutrophils Relative % 45  43 - 77 %   Neutro Abs 2.8  1.7 - 7.7 K/uL   Lymphocytes Relative 45  12 - 46 %   Lymphs Abs 2.8  0.7 - 4.0 K/uL   Monocytes Relative 9  3 - 12 %    Monocytes Absolute 0.6  0.1 - 1.0 K/uL   Eosinophils Relative 1  0 - 5 %   Eosinophils Absolute 0.0  0.0 - 0.7 K/uL   Basophils Relative 0  0 - 1 %   Basophils Absolute 0.0  0.0 - 0.1 K/uL  COMPREHENSIVE METABOLIC PANEL      Result Value Range   Sodium 135  135 - 145 mEq/L   Potassium 3.2 (*) 3.5 - 5.1 mEq/L   Chloride 100  96 - 112 mEq/L   CO2 23  19 - 32 mEq/L   Glucose, Bld 94  70 - 99 mg/dL   BUN 20  6 - 23 mg/dL   Creatinine, Ser 1.61  0.50 - 1.10 mg/dL   Calcium 9.4  8.4 - 09.6 mg/dL   Total Protein 8.7 (*) 6.0 - 8.3 g/dL   Albumin 4.1  3.5 - 5.2 g/dL   AST 23  0 - 37 U/L   ALT 29  0 - 35 U/L   Alkaline Phosphatase 111  39 - 117 U/L   Total Bilirubin 0.4  0.3 - 1.2 mg/dL   GFR calc non Af Amer 68 (*) >90 mL/min   GFR calc Af Amer 79 (*) >90 mL/min  LIPASE, BLOOD      Result Value Range   Lipase 21  11 - 59 U/L  URINALYSIS, ROUTINE W REFLEX MICROSCOPIC      Result Value Range   Color, Urine AMBER (*) YELLOW   APPearance CLEAR  CLEAR   Specific Gravity, Urine 1.030  1.005 - 1.030   pH 5.5  5.0 - 8.0   Glucose, UA NEGATIVE  NEGATIVE mg/dL   Hgb urine dipstick NEGATIVE  NEGATIVE   Bilirubin Urine SMALL (*) NEGATIVE   Ketones, ur NEGATIVE  NEGATIVE mg/dL   Protein, ur NEGATIVE  NEGATIVE mg/dL   Urobilinogen, UA 0.2  0.0 - 1.0 mg/dL   Nitrite NEGATIVE  NEGATIVE   Leukocytes, UA NEGATIVE  NEGATIVE   No results found.   EKG Interpretation   None       MDM   1. Viral gastroenteritis     Patient with likely viral syndrome causing n/v/d and cough.  Afebrile here.  No concerning localized abdominal tenderness.  Will check labs, give fluids, and reassess.  Labs are unremarkable. Patient is tolerating PO.  VSS.  No fever.  Will discharge to home with PCP follow up.  Return precautions are given.  Patient is stable and ready for discharge, and understands and agrees with the plan.    Roxy Horseman, PA-C 08/04/13 2105

## 2013-08-04 NOTE — ED Notes (Signed)
Bed: WA21 Expected date:  Expected time:  Means of arrival:  Comments: Triage 2 

## 2013-08-04 NOTE — ED Notes (Signed)
Pt taken ginger ale, however requested to walk to the restroom before drinking. While in restroom pt began to vomit. RN aware.

## 2013-08-05 NOTE — ED Provider Notes (Signed)
Medical screening examination/treatment/procedure(s) were performed by non-physician practitioner and as supervising physician I was immediately available for consultation/collaboration.  EKG Interpretation   None         Courtney F Horton, MD 08/05/13 1655 

## 2013-08-27 DIAGNOSIS — S069XAA Unspecified intracranial injury with loss of consciousness status unknown, initial encounter: Secondary | ICD-10-CM

## 2013-08-27 DIAGNOSIS — S069X9A Unspecified intracranial injury with loss of consciousness of unspecified duration, initial encounter: Secondary | ICD-10-CM

## 2013-08-27 HISTORY — DX: Unspecified intracranial injury with loss of consciousness status unknown, initial encounter: S06.9XAA

## 2013-08-27 HISTORY — DX: Unspecified intracranial injury with loss of consciousness of unspecified duration, initial encounter: S06.9X9A

## 2013-10-05 ENCOUNTER — Emergency Department (HOSPITAL_COMMUNITY): Payer: BC Managed Care – PPO

## 2013-10-05 ENCOUNTER — Encounter (HOSPITAL_COMMUNITY): Payer: Self-pay | Admitting: Emergency Medicine

## 2013-10-05 ENCOUNTER — Emergency Department (HOSPITAL_COMMUNITY)
Admission: EM | Admit: 2013-10-05 | Discharge: 2013-10-06 | Disposition: A | Discharge status: Home / Self Care | Payer: BC Managed Care – PPO | Attending: Emergency Medicine | Admitting: Emergency Medicine

## 2013-10-05 DIAGNOSIS — Z880 Allergy status to penicillin: Secondary | ICD-10-CM | POA: Insufficient documentation

## 2013-10-05 DIAGNOSIS — R079 Chest pain, unspecified: Secondary | ICD-10-CM

## 2013-10-05 DIAGNOSIS — G43909 Migraine, unspecified, not intractable, without status migrainosus: Secondary | ICD-10-CM | POA: Insufficient documentation

## 2013-10-05 DIAGNOSIS — G40909 Epilepsy, unspecified, not intractable, without status epilepticus: Secondary | ICD-10-CM | POA: Insufficient documentation

## 2013-10-05 DIAGNOSIS — J45901 Unspecified asthma with (acute) exacerbation: Secondary | ICD-10-CM | POA: Insufficient documentation

## 2013-10-05 DIAGNOSIS — Z79899 Other long term (current) drug therapy: Secondary | ICD-10-CM | POA: Insufficient documentation

## 2013-10-05 DIAGNOSIS — R0789 Other chest pain: Secondary | ICD-10-CM | POA: Insufficient documentation

## 2013-10-05 LAB — CBC
HCT: 39.2 % (ref 36.0–46.0)
Hemoglobin: 13.2 g/dL (ref 12.0–15.0)
MCH: 27.7 pg (ref 26.0–34.0)
MCHC: 33.7 g/dL (ref 30.0–36.0)
MCV: 82.2 fL (ref 78.0–100.0)
Platelets: 189 10*3/uL (ref 150–400)
RBC: 4.77 MIL/uL (ref 3.87–5.11)
RDW: 14.4 % (ref 11.5–15.5)
WBC: 8.4 10*3/uL (ref 4.0–10.5)

## 2013-10-05 LAB — BASIC METABOLIC PANEL
BUN: 15 mg/dL (ref 6–23)
CALCIUM: 8.9 mg/dL (ref 8.4–10.5)
CHLORIDE: 107 meq/L (ref 96–112)
CO2: 19 mEq/L (ref 19–32)
CREATININE: 0.84 mg/dL (ref 0.50–1.10)
GFR, EST NON AFRICAN AMERICAN: 82 mL/min — AB (ref >90)
Glucose, Bld: 95 mg/dL (ref 70–99)
Potassium: 3.7 mEq/L (ref 3.7–5.3)
Sodium: 141 mEq/L (ref 137–147)

## 2013-10-05 LAB — PRO B NATRIURETIC PEPTIDE

## 2013-10-05 LAB — POCT I-STAT TROPONIN I: Troponin i, poc: 0 ng/mL (ref 0.00–0.08)

## 2013-10-05 LAB — TROPONIN I: Troponin I: <0.3 ng/mL (ref <0.30)

## 2013-10-05 MED ORDER — HYDROMORPHONE HCL PF 1 MG/ML IJ SOLN
1.0000 mg | Freq: Once | INTRAMUSCULAR | Status: AC
  Administered 2013-10-05: 1 mg via INTRAVENOUS
  Filled 2013-10-05: qty 1

## 2013-10-05 MED ORDER — MORPHINE SULFATE 4 MG/ML IJ SOLN
4.0000 mg | Freq: Once | INTRAMUSCULAR | Status: AC
  Administered 2013-10-05: 4 mg via INTRAVENOUS
  Filled 2013-10-05: qty 1

## 2013-10-05 MED ORDER — SODIUM CHLORIDE 0.9 % IV BOLUS (SEPSIS)
1000.0000 mL | INTRAVENOUS | Status: AC
  Administered 2013-10-05: 1000 mL via INTRAVENOUS

## 2013-10-05 MED ORDER — ONDANSETRON HCL 4 MG/2ML IJ SOLN
4.0000 mg | Freq: Once | INTRAMUSCULAR | Status: AC
  Administered 2013-10-05: 4 mg via INTRAVENOUS
  Filled 2013-10-05: qty 2

## 2013-10-05 MED ORDER — ASPIRIN 325 MG PO TABS
325.0000 mg | ORAL_TABLET | ORAL | Status: AC
Start: 2013-10-05 — End: 2013-10-05
  Administered 2013-10-05: 325 mg via ORAL
  Filled 2013-10-05: qty 1

## 2013-10-05 NOTE — ED Notes (Signed)
Pt transported to XRAY °

## 2013-10-05 NOTE — ED Provider Notes (Signed)
CSN: [id number]     Arrival date & time 10/05/13  1947 History   First MD Initiated Contact with Patient 10/05/13 2053     Chief Complaint  Patient presents with  . Chest Pain     (Consider location/radiation/quality/duration/timing/severity/associated sxs/prior Treatment) HPI Pt is a 48yo morbidly obese female with hx of migraine, seizures, asthma, and GERD presenting with chest pain that started around 11:30 this morning. Describes pain as "someone sitting on my chest, like pressure," 7/10 radiating down right arm that has been constant since onset.  Pt states pain is worse with deep breathing. She is also c/o nausea and frontal aching headache that does not feel like a migraine, states it feels like it is behind her eyes.  Reports taking 2 Aleve earlier this morning w/o relief. Positive family hx of CAD, reports father had MI in his 63s. No personal hx of CAD.  Past Medical History  Diagnosis Date  . Migraine   . Seizures   . Asthma   . GERD (gastroesophageal reflux disease)   . Obesity, Class III, BMI 40-49.9 (morbid obesity) 12/21/2012   Past Surgical History  Procedure Laterality Date  . Abdominal hysterectomy    . Cesarean section    . Esophagogastroduodenoscopy N/A 11/21/2012    Procedure: ESOPHAGOGASTRODUODENOSCOPY (EGD);  Surgeon: Irene Shipper, MD;  Location: [facility];  Service: Endoscopy;  Laterality: N/A;  . Colonoscopy N/A 11/21/2012    Procedure: COLONOSCOPY;  Surgeon: Irene Shipper, MD;  Location: [facility];  Service: Endoscopy;  Laterality: N/A;  . Dilation and curettage of uterus    . Hernia repair  12/17/12    laparoscopic incisional   . Incisional hernia repair N/A 12/17/2012    Procedure: LAPAROSCOPIC INCISIONAL HERNIA;  Surgeon: Harl Bowie, MD;  Location: [facility];  Service: General;  Laterality: N/A;  . Insertion of mesh N/A 12/17/2012    Procedure: INSERTION OF MESH;  Surgeon: Harl Bowie, MD;  Location: [facility];  Service: General;  Laterality: N/A;    Family History  Problem Relation Age of Onset  . Hypertension Mother   . Depression Mother   . Heart attack Father   . Hypertension Father   . Emphysema Father   . COPD Father   . Hypertension Sister    History  Substance Use Topics  . Smoking status: Never Smoker   . Smokeless tobacco: Never Used  . Alcohol Use: Yes     Comment: occ glass of wine   OB History   Grav Para Term Preterm Abortions TAB SAB Ect Mult Living                 Review of Systems  Constitutional: Negative for fever, chills, diaphoresis and fatigue.  Respiratory: Positive for shortness of breath. Negative for cough.   Cardiovascular: Positive for chest pain. Negative for palpitations and leg swelling.  Gastrointestinal: Positive for nausea. Negative for vomiting, abdominal pain, diarrhea and constipation.  Musculoskeletal: Negative for back pain.  All other systems reviewed and are negative.      Allergies  Amoxicillin; Biaxin; Erythromycin base; Penicillins; and Zithromax  Home Medications   Current Outpatient Rx  Name  Route  Sig  Dispense  Refill  . albuterol (PROVENTIL HFA;VENTOLIN HFA) 108 (90 BASE) MCG/ACT inhaler   Inhalation   Inhale 2 puffs into the lungs every 6 (six) hours as needed for wheezing.         . clonazePAM (KLONOPIN) 1 MG tablet   Oral  Take 1 mg by mouth at bedtime as needed (for sleep and if she feels a seizure coming on).         . eszopiclone (LUNESTA) 2 MG TABS tablet   Oral   Take 2 mg by mouth at bedtime as needed for sleep. Take immediately before bedtime         . lamoTRIgine (LAMICTAL) 100 MG tablet   Oral   Take 1 tablet (100 mg total) by mouth 2 (two) times daily.   60 tablet   11   . naproxen sodium (ANAPROX) 220 MG tablet   Oral   Take 440 mg by mouth 2 (two) times daily as needed (pain).         . topiramate (TOPAMAX) 100 MG tablet   Oral   Take 1 tablet (100 mg total) by mouth 2 (two) times daily.   60 tablet   6   . estradiol  (ESTRING) 2 MG vaginal ring   Vaginal   Place 2 mg vaginally every 3 (three) months. follow package directions         . SUMAtriptan (IMITREX) 100 MG tablet   Oral   Take 1 tablet (100 mg total) by mouth every 2 (two) hours as needed for migraine. Max 2 doses per 24 hours   10 tablet   6   . traMADol (ULTRAM) 50 MG tablet   Oral   Take 1 tablet (50 mg total) by mouth every 6 (six) hours as needed.   15 tablet   0    BP 122/60  Pulse 60  Temp(Src) 98 F (36.7 C) (Oral)  Resp 16  SpO2 97% Physical Exam  Nursing note and vitals reviewed. Constitutional: She appears well-developed and well-nourished. No distress.  morbidly obese female lying in exam bed, NAD.  HENT:  Head: Normocephalic and atraumatic.  Eyes: Conjunctivae are normal. No scleral icterus.  Neck: Normal range of motion.  Cardiovascular: Normal rate, regular rhythm and normal heart sounds.   Pulmonary/Chest: Effort normal and breath sounds normal. No respiratory distress. She has no wheezes. She has no rales. She exhibits no tenderness.  No respiratory distress, able to speak in full sentences w/o difficulty. Lungs: CTAB  Abdominal: Soft. Bowel sounds are normal. She exhibits no distension and no mass. There is no tenderness. There is no rebound and no guarding.  Musculoskeletal: Normal range of motion.  Neurological: She is alert.  Skin: Skin is warm and dry. She is not diaphoretic.    ED Course  Procedures (including critical care time) Labs Review Labs Reviewed  BASIC METABOLIC PANEL - Abnormal; Notable for the following:    GFR calc non Af Amer 82 (*)    All other components within normal limits  CBC  PRO B NATRIURETIC PEPTIDE  TROPONIN I  POCT I-STAT TROPONIN I   Imaging Review Dg Chest 2 View  10/05/2013   CLINICAL DATA:  Shortness of breath, chest pain  EXAM: CHEST  2 VIEW  COMPARISON:  December 10, 2012  FINDINGS: The heart size and mediastinal contours are within normal limits. There is no focal  infiltrate, pulmonary edema, or pleural effusion. The visualized skeletal structures are unremarkable.  IMPRESSION: No active cardiopulmonary disease.   Electronically Signed   By: Abelardo Diesel M.D.   On: 10/05/2013 22:13    EKG Interpretation   None       MDM   Final diagnoses:  Chest Pain   Pt is a 48yo female with c/o chest  pain that started at 1130am and has been constant since.   CXR: unremarkable.   EKG: NSR, no arrhythmia  Serial troponin's: negative.  CBC, BMP, and BNP: unremarkable.  10:47 PM Pt still c/o headache, chest pain, and nausea. No change since zofran and morphine.  Will give dilaudid, fluids, and zofran.    Pt is low risk for major cardiac event, 3 points via the HEART Score.  Pt was given aspirin upon arrival to ED, morphine as well as dilaudid and toradol. Pt states the medication has not helped her chest pain, it has only made her tired.  Discussed pt with Dr. Aline Brochure who also examined pt.  Will discharged pt home to f/u with PCP for ongoing chest pain. Rx: tramadol. Return precautions provided. Pt verbalized understanding and agreement with tx plan.   Noland Fordyce, PA-C 10/06/13 [phone]

## 2013-10-05 NOTE — ED Notes (Signed)
Pt from home c/o chest pain with shortness of breath and pain radiating down right arm beginning at about 1130 today. Nausea and headache present.

## 2013-10-06 MED ORDER — KETOROLAC TROMETHAMINE 30 MG/ML IJ SOLN
30.0000 mg | Freq: Once | INTRAMUSCULAR | Status: AC
  Administered 2013-10-06: 30 mg via INTRAVENOUS
  Filled 2013-10-06: qty 1

## 2013-10-06 MED ORDER — TRAMADOL HCL 50 MG PO TABS: 50.0000 mg | ORAL_TABLET | Freq: Four times a day (QID) | ORAL | Status: DC | PRN

## 2013-10-06 NOTE — Discharge Instructions (Signed)
Chest Pain (Nonspecific) °It is often hard to give a specific diagnosis for the cause of chest pain. There is always a chance that your pain could be related to something serious, such as a heart attack or a blood clot in the lungs. You need to follow up with your caregiver for further evaluation. °CAUSES  °· Heartburn. °· Pneumonia or bronchitis. °· Anxiety or stress. °· Inflammation around your heart (pericarditis) or lung (pleuritis or pleurisy). °· A blood clot in the lung. °· A collapsed lung (pneumothorax). It can develop suddenly on its own (spontaneous pneumothorax) or from injury (trauma) to the chest. °· Shingles infection (herpes zoster virus). °The chest wall is composed of bones, muscles, and cartilage. Any of these can be the source of the pain. °· The bones can be bruised by injury. °· The muscles or cartilage can be strained by coughing or overwork. °· The cartilage can be affected by inflammation and become sore (costochondritis). °DIAGNOSIS  °Lab tests or other studies, such as X-rays, electrocardiography, stress testing, or cardiac imaging, may be needed to find the cause of your pain.  °TREATMENT  °· Treatment depends on what may be causing your chest pain. Treatment may include: °· Acid blockers for heartburn. °· Anti-inflammatory medicine. °· Pain medicine for inflammatory conditions. °· Antibiotics if an infection is present. °· You may be advised to change lifestyle habits. This includes stopping smoking and avoiding alcohol, caffeine, and chocolate. °· You may be advised to keep your head raised (elevated) when sleeping. This reduces the chance of acid going backward from your stomach into your esophagus. °· Most of the time, nonspecific chest pain will improve within 2 to 3 days with rest and mild pain medicine. °HOME CARE INSTRUCTIONS  °· If antibiotics were prescribed, take your antibiotics as directed. Finish them even if you start to feel better. °· For the next few days, avoid physical  activities that bring on chest pain. Continue physical activities as directed. °· Do not smoke. °· Avoid drinking alcohol. °· Only take over-the-counter or prescription medicine for pain, discomfort, or fever as directed by your caregiver. °· Follow your caregiver's suggestions for further testing if your chest pain does not go away. °· Keep any follow-up appointments you made. If you do not go to an appointment, you could develop lasting (chronic) problems with pain. If there is any problem keeping an appointment, you must call to reschedule. °SEEK MEDICAL CARE IF:  °· You think you are having problems from the medicine you are taking. Read your medicine instructions carefully. °· Your chest pain does not go away, even after treatment. °· You develop a rash with blisters on your chest. °SEEK IMMEDIATE MEDICAL CARE IF:  °· You have increased chest pain or pain that spreads to your arm, neck, jaw, back, or abdomen. °· You develop shortness of breath, an increasing cough, or you are coughing up blood. °· You have severe back or abdominal pain, feel nauseous, or vomit. °· You develop severe weakness, fainting, or chills. °· You have a fever. °THIS IS AN EMERGENCY. Do not wait to see if the pain will go away. Get medical help at once. Call your local emergency services (911 in [place] Do not drive yourself to the hospital. °MAKE SURE YOU:  °· Understand these instructions. °· Will watch your condition. °· Will get help right away if you are not doing well or get worse. °Document Released: 05/23/2005 Document Revised: 11/05/2011 Document Reviewed: 03/18/2008 °[employer]® Patient Information ©2014 [employer]   [employer]. ° °

## 2013-10-06 NOTE — ED Provider Notes (Signed)
Medical screening examination/treatment/procedure(s) were conducted as a shared visit with non-physician practitioner(s) and myself.  I personally evaluated the patient during the encounter.   Date: 10/06/2013  Rate: 84  Rhythm: normal sinus rhythm  QRS Axis: normal  Intervals: normal  ST/T Wave abnormalities: normal  Conduction Disutrbances:none  Narrative Interpretation: septal infarct age undetermined, NSR, no change from previous tracing  Old EKG Reviewed: unchanged  I interviewed and examined the patient. Lungs are CTAB. Cardiac exam wnl. Abdomen soft. Pt w/ ongoing cp on my evaluation despite narcotics. Well's/Perc neg. Low risk for MACE per heart score. Few RF's for cardiac disease. Pain constant and unwavering per pt.  Lasting >12hrs and neg delta trop. Doubt [place]. Pt upset that she will not be admitted. I think she is low risk for a cardiac cause of her pain. She has a pcp. I recommend close f/u w/ her pcp. Return precautions provided.      Blanchard Kelch, MD 10/06/13 1339

## 2013-10-06 NOTE — ED Notes (Signed)
MD HAarrison at bedside.

## 2013-10-06 NOTE — ED Notes (Signed)
Pt does not want to be discharged. Pt has been advised not to drive under the influence of narcotic medication. Offered patient to wait in waiting room until medication wears off. PT upset about being discharged and not being able to sleep in room.

## 2013-10-30 ENCOUNTER — Encounter (HOSPITAL_COMMUNITY): Payer: Self-pay | Admitting: Emergency Medicine

## 2013-10-30 ENCOUNTER — Emergency Department (HOSPITAL_COMMUNITY)
Admission: EM | Admit: 2013-10-30 | Discharge: 2013-10-30 | Disposition: A | Discharge status: Home / Self Care | Payer: BC Managed Care – PPO | Attending: Emergency Medicine | Admitting: Emergency Medicine

## 2013-10-30 DIAGNOSIS — Z880 Allergy status to penicillin: Secondary | ICD-10-CM | POA: Insufficient documentation

## 2013-10-30 DIAGNOSIS — K219 Gastro-esophageal reflux disease without esophagitis: Secondary | ICD-10-CM | POA: Insufficient documentation

## 2013-10-30 DIAGNOSIS — J45909 Unspecified asthma, uncomplicated: Secondary | ICD-10-CM | POA: Insufficient documentation

## 2013-10-30 DIAGNOSIS — Z79899 Other long term (current) drug therapy: Secondary | ICD-10-CM | POA: Insufficient documentation

## 2013-10-30 DIAGNOSIS — G43009 Migraine without aura, not intractable, without status migrainosus: Secondary | ICD-10-CM

## 2013-10-30 DIAGNOSIS — G43909 Migraine, unspecified, not intractable, without status migrainosus: Secondary | ICD-10-CM | POA: Insufficient documentation

## 2013-10-30 DIAGNOSIS — M542 Cervicalgia: Secondary | ICD-10-CM | POA: Insufficient documentation

## 2013-10-30 LAB — CBG MONITORING, ED: Glucose-Capillary: 101 mg/dL — ABNORMAL HIGH (ref 70–99)

## 2013-10-30 MED ORDER — LORAZEPAM 2 MG/ML IJ SOLN
1.0000 mg | Freq: Once | INTRAMUSCULAR | Status: AC
  Administered 2013-10-30: 1 mg via INTRAVENOUS
  Filled 2013-10-30: qty 1

## 2013-10-30 MED ORDER — KETOROLAC TROMETHAMINE 15 MG/ML IJ SOLN
30.0000 mg | Freq: Once | INTRAMUSCULAR | Status: AC
  Administered 2013-10-30: 30 mg via INTRAVENOUS
  Filled 2013-10-30: qty 2

## 2013-10-30 MED ORDER — DEXAMETHASONE SODIUM PHOSPHATE 10 MG/ML IJ SOLN
10.0000 mg | Freq: Once | INTRAMUSCULAR | Status: AC
  Administered 2013-10-30: 10 mg via INTRAVENOUS
  Filled 2013-10-30: qty 1

## 2013-10-30 MED ORDER — METOCLOPRAMIDE HCL 5 MG/ML IJ SOLN
10.0000 mg | Freq: Once | INTRAMUSCULAR | Status: AC
  Administered 2013-10-30: 10 mg via INTRAVENOUS
  Filled 2013-10-30: qty 2

## 2013-10-30 MED ORDER — DIPHENHYDRAMINE HCL 50 MG/ML IJ SOLN
25.0000 mg | Freq: Once | INTRAMUSCULAR | Status: AC
  Administered 2013-10-30: 25 mg via INTRAVENOUS
  Filled 2013-10-30: qty 1

## 2013-10-30 NOTE — Discharge Instructions (Signed)
Please follow up with your neurologist for further workup, treatment and evaluation of your ongoing headaches.   Migraine Headache A migraine headache is an intense, throbbing pain on one or both sides of your head. A migraine can last for 30 minutes to several hours. CAUSES  The exact cause of a migraine headache is not always known. However, a migraine may be caused when nerves in the brain become irritated and release chemicals that cause inflammation. This causes pain. Certain things may also trigger migraines, such as:  Alcohol.  Smoking.  Stress.  Menstruation.  Aged cheeses.  Foods or drinks that contain nitrates, glutamate, aspartame, or tyramine.  Lack of sleep.  Chocolate.  Caffeine.  Hunger.  Physical exertion.  Fatigue.  Medicines used to treat chest pain (nitroglycerine), birth control pills, estrogen, and some blood pressure medicines. SIGNS AND SYMPTOMS  Pain on one or both sides of your head.  Pulsating or throbbing pain.  Severe pain that prevents daily activities.  Pain that is aggravated by any physical activity.  Nausea, vomiting, or both.  Dizziness.  Pain with exposure to bright lights, loud noises, or activity.  General sensitivity to bright lights, loud noises, or smells. Before you get a migraine, you may get warning signs that a migraine is coming (aura). An aura may include:  Seeing flashing lights.  Seeing bright spots, halos, or zig-zag lines.  Having tunnel vision or blurred vision.  Having feelings of numbness or tingling.  Having trouble talking.  Having muscle weakness. DIAGNOSIS  A migraine headache is often diagnosed based on:  Symptoms.  Physical exam.  A CT scan or MRI of your head. These imaging tests cannot diagnose migraines, but they can help rule out other causes of headaches. TREATMENT Medicines may be given for pain and nausea. Medicines can also be given to help prevent recurrent migraines.  HOME  CARE INSTRUCTIONS  Only take over-the-counter or prescription medicines for pain or discomfort as directed by your health care provider. The use of long-term narcotics is not recommended.  Lie down in a dark, quiet room when you have a migraine.  Keep a journal to find out what may trigger your migraine headaches. For example, write down:  What you eat and drink.  How much sleep you get.  Any change to your diet or medicines.  Limit alcohol consumption.  Quit smoking if you smoke.  Get 7 9 hours of sleep, or as recommended by your health care provider.  Limit stress.  Keep lights dim if bright lights bother you and make your migraines worse. SEEK IMMEDIATE MEDICAL CARE IF:   Your migraine becomes severe.  You have a fever.  You have a stiff neck.  You have vision loss.  You have muscular weakness or loss of muscle control.  You start losing your balance or have trouble walking.  You feel faint or pass out.  You have severe symptoms that are different from your first symptoms. MAKE SURE YOU:   Understand these instructions.  Will watch your condition.  Will get help right away if you are not doing well or get worse. Document Released: 08/13/2005 Document Revised: 06/03/2013 Document Reviewed: 04/20/2013 [facility] Patient Information 2014 [employer].

## 2013-10-30 NOTE — ED Notes (Addendum)
Pt BIB EMS, pt had a seizure at home approximately 0130, pt post-ictal upon EMS arrival to home, pt now a&o x4, pt reports she has had a migraine HA since Monday, increased in pain since that time, called her sister because "she knew something wasn't right." and states she believes she had a seizure in her sleep. Pt c/o continued HA. Noted to be hypertensive 180/110 with EMS, NSR. States she takes her medication as prescribed and this is the first seizure she has had in the past 10 years.

## 2013-10-30 NOTE — ED Notes (Signed)
Bed: OE32 Expected date:  Expected time:  Means of arrival:  Comments: EMS 47yo F Seizure

## 2013-10-30 NOTE — ED Provider Notes (Signed)
CSN: [id number]     Arrival date & time 10/30/13  0216 History   First MD Initiated Contact with Patient 10/30/13 [phone]     Chief Complaint  Patient presents with  . Seizures     (Consider location/radiation/quality/duration/timing/severity/associated sxs/prior Treatment) HPI 48 year old female presents emergency department via EMS with complaint of seizure in her sleep and persistent migraine.  Patient has history of severe migraines, seizure disorder, asthma, morbid obesity.  She reports she has had a unrelenting headache since Monday.  She is taking her normal medications without improvement.  Last night, she reports that she did not sleep well, only taking cat naps.  She has awoke to find herself having a jerking-type seizure.  She reports that she was aware of her body twitching, but could not stop it.  No tongue biting, no urinary incontinence.  Per EMS, patient had post ictal state.  Patient reports she has not had a seizure in 10 years.  She denies missing any medications.  No fever, chills, neck stiffness, confusion.  She reports her migraine headache is typical of her normal headaches.  It is posterior and extends down her neck.  She reports that palpation of the area, and rotation of the head worsen her pain.  She is tried ice packs and Excedrin Migraine in addition to her normal medications. Past Medical History  Diagnosis Date  . Migraine   . Seizures   . Asthma   . GERD (gastroesophageal reflux disease)   . Obesity, Class III, BMI 40-49.9 (morbid obesity) 12/21/2012   Past Surgical History  Procedure Laterality Date  . Abdominal hysterectomy    . Cesarean section    . Esophagogastroduodenoscopy N/A 11/21/2012    Procedure: ESOPHAGOGASTRODUODENOSCOPY (EGD);  Surgeon: Irene Shipper, MD;  Location: [facility];  Service: Endoscopy;  Laterality: N/A;  . Colonoscopy N/A 11/21/2012    Procedure: COLONOSCOPY;  Surgeon: Irene Shipper, MD;  Location: [facility];  Service: Endoscopy;   Laterality: N/A;  . Dilation and curettage of uterus    . Hernia repair  12/17/12    laparoscopic incisional   . Incisional hernia repair N/A 12/17/2012    Procedure: LAPAROSCOPIC INCISIONAL HERNIA;  Surgeon: Harl Bowie, MD;  Location: [facility];  Service: General;  Laterality: N/A;  . Insertion of mesh N/A 12/17/2012    Procedure: INSERTION OF MESH;  Surgeon: Harl Bowie, MD;  Location: [facility];  Service: General;  Laterality: N/A;   Family History  Problem Relation Age of Onset  . Hypertension Mother   . Depression Mother   . Heart attack Father   . Hypertension Father   . Emphysema Father   . COPD Father   . Hypertension Sister    History  Substance Use Topics  . Smoking status: Never Smoker   . Smokeless tobacco: Never Used  . Alcohol Use: Yes     Comment: occ glass of wine   OB History   Grav Para Term Preterm Abortions TAB SAB Ect Mult Living                 Review of Systems   See History of Present Illness; otherwise all other systems are reviewed and negative  Allergies  Amoxicillin; Biaxin; Erythromycin base; Penicillins; and Zithromax  Home Medications   Current Outpatient Rx  Name  Route  Sig  Dispense  Refill  . albuterol (PROVENTIL HFA;VENTOLIN HFA) 108 (90 BASE) MCG/ACT inhaler   Inhalation   Inhale 2 puffs into the lungs  every 6 (six) hours as needed for wheezing.         . clonazePAM (KLONOPIN) 1 MG tablet   Oral   Take 1 mg by mouth at bedtime as needed (for sleep and if she feels a seizure coming on).         Marland Kitchen estradiol (ESTRING) 2 MG vaginal ring   Vaginal   Place 2 mg vaginally every 3 (three) months. follow package directions         . lamoTRIgine (LAMICTAL) 100 MG tablet   Oral   Take 1 tablet (100 mg total) by mouth 2 (two) times daily.   60 tablet   11   . topiramate (TOPAMAX) 100 MG tablet   Oral   Take 1 tablet (100 mg total) by mouth 2 (two) times daily.   60 tablet   6   . eszopiclone (LUNESTA) 2 MG TABS  tablet   Oral   Take 2 mg by mouth at bedtime as needed for sleep. Take immediately before bedtime         . SUMAtriptan (IMITREX) 100 MG tablet   Oral   Take 1 tablet (100 mg total) by mouth every 2 (two) hours as needed for migraine. Max 2 doses per 24 hours   10 tablet   6    BP 123/71  Pulse 55  Temp(Src) 98.3 F (36.8 C) (Oral)  Resp 12  SpO2 97% Physical Exam  Nursing note and vitals reviewed. Constitutional: She is oriented to person, place, and time. She appears well-developed and well-nourished.  HENT:  Head: Normocephalic and atraumatic.  Right Ear: External ear normal.  Left Ear: External ear normal.  Nose: Nose normal.  Mouth/Throat: Oropharynx is clear and moist.  Eyes: Conjunctivae and EOM are normal. Pupils are equal, round, and reactive to light.  Neck: Normal range of motion. Neck supple. No JVD present. No tracheal deviation present. No thyromegaly present.  Mild TTP over occiput and paraspinal cervical neck  Cardiovascular: Normal rate, regular rhythm, normal heart sounds and intact distal pulses.  Exam reveals no gallop and no friction rub.   No murmur heard. Pulmonary/Chest: Effort normal and breath sounds normal. No stridor. No respiratory distress. She has no wheezes. She has no rales. She exhibits no tenderness.  Abdominal: Soft. Bowel sounds are normal. She exhibits no distension and no mass. There is no tenderness. There is no rebound and no guarding.  Musculoskeletal: Normal range of motion. She exhibits no edema and no tenderness.  Lymphadenopathy:    She has no cervical adenopathy.  Neurological: She is alert and oriented to person, place, and time. She has normal reflexes. No cranial nerve deficit. She exhibits normal muscle tone. Coordination normal.  Skin: Skin is warm and dry. No rash noted. No erythema. No pallor.  Psychiatric: She has a normal mood and affect. Her behavior is normal. Judgment and thought content normal.    ED Course   Procedures (including critical care time) Labs Review Labs Reviewed  CBG MONITORING, ED - Abnormal; Notable for the following:    Glucose-Capillary 101 (*)    All other components within normal limits  CBG MONITORING, ED   Imaging Review No results found.   EKG Interpretation   Date/Time:  Friday October 30 2013 02:24:22 EST Ventricular Rate:  68 PR Interval:  180 QRS Duration: 82 QT Interval:  407 QTC Calculation: 433 R Axis:   37 Text Interpretation:  Sinus rhythm Low voltage, precordial leads Confirmed  by Raheen Capili  MD, Michelene Heady ([id number]) on 10/30/2013 4:31:47 AM      MDM   Final diagnoses:  Migraine without aura, without mention of intractable migraine without mention of status migrainosus    48 year old female with possible breakthrough seizure, and ongoing migraine.  Plan for headache cocktail.  She is intact neurologically.  Patient has a neurologist, that she can follow closely with.    Kalman Drape, MD 10/30/13 [phone]

## 2013-11-03 DIAGNOSIS — Z0289 Encounter for other administrative examinations: Secondary | ICD-10-CM

## 2013-12-10 ENCOUNTER — Encounter: Payer: Self-pay | Admitting: Obstetrics & Gynecology

## 2014-01-21 ENCOUNTER — Other Ambulatory Visit: Payer: Self-pay

## 2014-01-21 MED ORDER — ESZOPICLONE 2 MG PO TABS: 2.0000 mg | ORAL_TABLET | Freq: Every evening | ORAL | Status: DC | PRN

## 2014-01-21 NOTE — Telephone Encounter (Signed)
Rx signed and faxed.

## 2014-01-26 DIAGNOSIS — G43909 Migraine, unspecified, not intractable, without status migrainosus: Secondary | ICD-10-CM | POA: Insufficient documentation

## 2014-02-03 ENCOUNTER — Emergency Department (HOSPITAL_BASED_OUTPATIENT_CLINIC_OR_DEPARTMENT_OTHER)
Admission: EM | Admit: 2014-02-03 | Discharge: 2014-02-03 | Disposition: A | Discharge status: Home / Self Care | Payer: BC Managed Care – PPO | Attending: Emergency Medicine | Admitting: Emergency Medicine

## 2014-02-03 ENCOUNTER — Encounter (HOSPITAL_BASED_OUTPATIENT_CLINIC_OR_DEPARTMENT_OTHER): Payer: Self-pay | Admitting: Emergency Medicine

## 2014-02-03 DIAGNOSIS — G40909 Epilepsy, unspecified, not intractable, without status epilepticus: Secondary | ICD-10-CM | POA: Insufficient documentation

## 2014-02-03 DIAGNOSIS — Z880 Allergy status to penicillin: Secondary | ICD-10-CM | POA: Insufficient documentation

## 2014-02-03 DIAGNOSIS — G43909 Migraine, unspecified, not intractable, without status migrainosus: Secondary | ICD-10-CM

## 2014-02-03 DIAGNOSIS — H9209 Otalgia, unspecified ear: Secondary | ICD-10-CM | POA: Insufficient documentation

## 2014-02-03 DIAGNOSIS — Z79899 Other long term (current) drug therapy: Secondary | ICD-10-CM | POA: Insufficient documentation

## 2014-02-03 DIAGNOSIS — Z8719 Personal history of other diseases of the digestive system: Secondary | ICD-10-CM | POA: Insufficient documentation

## 2014-02-03 DIAGNOSIS — J45909 Unspecified asthma, uncomplicated: Secondary | ICD-10-CM | POA: Insufficient documentation

## 2014-02-03 MED ORDER — DEXAMETHASONE SODIUM PHOSPHATE 10 MG/ML IJ SOLN
10.0000 mg | Freq: Once | INTRAMUSCULAR | Status: AC
  Administered 2014-02-03: 10 mg via INTRAVENOUS
  Filled 2014-02-03: qty 1

## 2014-02-03 MED ORDER — DIPHENHYDRAMINE HCL 50 MG/ML IJ SOLN
25.0000 mg | Freq: Once | INTRAMUSCULAR | Status: AC
  Administered 2014-02-03: 25 mg via INTRAVENOUS
  Filled 2014-02-03: qty 1

## 2014-02-03 MED ORDER — METOCLOPRAMIDE HCL 5 MG/ML IJ SOLN
10.0000 mg | Freq: Once | INTRAMUSCULAR | Status: AC
  Administered 2014-02-03: 10 mg via INTRAVENOUS
  Filled 2014-02-03: qty 2

## 2014-02-03 MED ORDER — SODIUM CHLORIDE 0.9 % IV BOLUS (SEPSIS)
1000.0000 mL | Freq: Once | INTRAVENOUS | Status: AC
  Administered 2014-02-03: 1000 mL via INTRAVENOUS

## 2014-02-03 NOTE — ED Notes (Addendum)
Patient states she developed a migraine headache yesterday at 0800.  States she has not been able to take her medications due to vomiting.  States she is also having sharp pains in her right ear.

## 2014-02-03 NOTE — Discharge Instructions (Signed)
Migraine Headache A migraine headache is an intense, throbbing pain on one or both sides of your head. A migraine can last for 30 minutes to several hours. CAUSES  The exact cause of a migraine headache is not always known. However, a migraine may be caused when nerves in the brain become irritated and release chemicals that cause inflammation. This causes pain. Certain things may also trigger migraines, such as:  Alcohol.  Smoking.  Stress.  Menstruation.  Aged cheeses.  Foods or drinks that contain nitrates, glutamate, aspartame, or tyramine.  Lack of sleep.  Chocolate.  Caffeine.  Hunger.  Physical exertion.  Fatigue.  Medicines used to treat chest pain (nitroglycerine), birth control pills, estrogen, and some blood pressure medicines. SIGNS AND SYMPTOMS  Pain on one or both sides of your head.  Pulsating or throbbing pain.  Severe pain that prevents daily activities.  Pain that is aggravated by any physical activity.  Nausea, vomiting, or both.  Dizziness.  Pain with exposure to bright lights, loud noises, or activity.  General sensitivity to bright lights, loud noises, or smells. Before you get a migraine, you may get warning signs that a migraine is coming (aura). An aura may include:  Seeing flashing lights.  Seeing bright spots, halos, or zig-zag lines.  Having tunnel vision or blurred vision.  Having feelings of numbness or tingling.  Having trouble talking.  Having muscle weakness. DIAGNOSIS  A migraine headache is often diagnosed based on:  Symptoms.  Physical exam.  A CT scan or MRI of your head. These imaging tests cannot diagnose migraines, but they can help rule out other causes of headaches. TREATMENT Medicines may be given for pain and nausea. Medicines can also be given to help prevent recurrent migraines.  HOME CARE INSTRUCTIONS  Only take over-the-counter or prescription medicines for pain or discomfort as directed by your  health care provider. The use of long-term narcotics is not recommended.  Lie down in a dark, quiet room when you have a migraine.  Keep a journal to find out what may trigger your migraine headaches. For example, write down:  What you eat and drink.  How much sleep you get.  Any change to your diet or medicines.  Limit alcohol consumption.  Quit smoking if you smoke.  Get 7 9 hours of sleep, or as recommended by your health care provider.  Limit stress.  Keep lights dim if bright lights bother you and make your migraines worse. SEEK IMMEDIATE MEDICAL CARE IF:   Your migraine becomes severe.  You have a fever.  You have a stiff neck.  You have vision loss.  You have muscular weakness or loss of muscle control.  You start losing your balance or have trouble walking.  You feel faint or pass out.  You have severe symptoms that are different from your first symptoms. MAKE SURE YOU:   Understand these instructions.  Will watch your condition.  Will get help right away if you are not doing well or get worse. Document Released: 08/13/2005 Document Revised: 06/03/2013 Document Reviewed: 04/20/2013 [employer] Patient Information 2014 [employer].  

## 2014-02-03 NOTE — ED Provider Notes (Addendum)
CSN: [id number]     Arrival date & time 02/03/14  0747 History   First MD Initiated Contact with Patient 02/03/14 0759     Chief Complaint  Patient presents with  . Migraine     (Consider location/radiation/quality/duration/timing/severity/associated sxs/prior Treatment) Patient is a 48 y.o. female presenting with migraines. The history is provided by the patient.  Migraine This is a recurrent (hx of migraines on topimax and imitrex with migraine starting yesterday and not improving) problem. The current episode started yesterday. The problem occurs constantly. The problem has been gradually worsening. Associated symptoms include headaches. Pertinent negatives include no abdominal pain and no shortness of breath. Associated symptoms comments: Vomiting and right ear pain.  No fever, weakness, numbness, gait or visual changes.  Photophobia. Exacerbated by: bright light and loud noise. Nothing relieves the symptoms. Treatments tried: imitrex and excedrin migraine. The treatment provided no relief.    Past Medical History  Diagnosis Date  . Migraine   . Seizures   . Asthma   . GERD (gastroesophageal reflux disease)   . Obesity, Class III, BMI 40-49.9 (morbid obesity) 12/21/2012   Past Surgical History  Procedure Laterality Date  . Abdominal hysterectomy    . Cesarean section    . Esophagogastroduodenoscopy N/A 11/21/2012    Procedure: ESOPHAGOGASTRODUODENOSCOPY (EGD);  Surgeon: Irene Shipper, MD;  Location: [facility];  Service: Endoscopy;  Laterality: N/A;  . Colonoscopy N/A 11/21/2012    Procedure: COLONOSCOPY;  Surgeon: Irene Shipper, MD;  Location: [facility];  Service: Endoscopy;  Laterality: N/A;  . Dilation and curettage of uterus    . Hernia repair  12/17/12    laparoscopic incisional   . Incisional hernia repair N/A 12/17/2012    Procedure: LAPAROSCOPIC INCISIONAL HERNIA;  Surgeon: Harl Bowie, MD;  Location: [facility];  Service: General;  Laterality: N/A;  . Insertion of  mesh N/A 12/17/2012    Procedure: INSERTION OF MESH;  Surgeon: Harl Bowie, MD;  Location: [facility];  Service: General;  Laterality: N/A;   Family History  Problem Relation Age of Onset  . Hypertension Mother   . Depression Mother   . Heart attack Father   . Hypertension Father   . Emphysema Father   . COPD Father   . Hypertension Sister    History  Substance Use Topics  . Smoking status: Never Smoker   . Smokeless tobacco: Never Used  . Alcohol Use: Yes     Comment: occ glass of wine   OB History   Grav Para Term Preterm Abortions TAB SAB Ect Mult Living                 Review of Systems  Constitutional: Negative for fever.  HENT: Positive for ear pain.   Respiratory: Negative for cough and shortness of breath.   Gastrointestinal: Positive for nausea and vomiting. Negative for abdominal pain.  Neurological: Positive for headaches. Negative for seizures, weakness and numbness.  All other systems reviewed and are negative.     Allergies  Amoxicillin; Biaxin; Erythromycin base; Penicillins; and Zithromax  Home Medications   Prior to Admission medications   Medication Sig Start Date End Date Taking? Authorizing Provider  albuterol (PROVENTIL HFA;VENTOLIN HFA) 108 (90 BASE) MCG/ACT inhaler Inhale 2 puffs into the lungs every 6 (six) hours as needed for wheezing.   Yes Historical Provider, MD  clonazePAM (KLONOPIN) 1 MG tablet Take 1 mg by mouth at bedtime as needed (for sleep and if she feels a seizure  coming on).   Yes Historical Provider, MD  estradiol (ESTRING) 2 MG vaginal ring Place 2 mg vaginally every 3 (three) months. follow package directions   Yes Historical Provider, MD  eszopiclone (LUNESTA) 2 MG TABS tablet Take 1 tablet (2 mg total) by mouth at bedtime as needed for sleep. Take immediately before bedtime 01/21/14  Yes Larey Seat, MD  lamoTRIgine (LAMICTAL) 100 MG tablet Take 1 tablet (100 mg total) by mouth 2 (two) times daily. 06/09/13  Yes Dennie Bible, NP  SUMAtriptan (IMITREX) 100 MG tablet Take 1 tablet (100 mg total) by mouth every 2 (two) hours as needed for migraine. Max 2 doses per 24 hours 06/09/13  Yes Dennie Bible, NP  topiramate (TOPAMAX) 100 MG tablet Take 1 tablet (100 mg total) by mouth 2 (two) times daily. 06/09/13  Yes Dennie Bible, NP   BP 145/83  Pulse 55  Temp(Src) 98.9 F (37.2 C) (Oral)  Resp 16  Ht 5\' 2"  (1.575 m)  Wt 263 lb (119.296 kg)  BMI 48.09 kg/m2  SpO2 97% Physical Exam  Nursing note and vitals reviewed. Constitutional: She is oriented to person, place, and time. She appears well-developed and well-nourished. She appears distressed.  HENT:  Head: Normocephalic and atraumatic.  Right Ear: Tympanic membrane normal.  Left Ear: Tympanic membrane normal.  Eyes: EOM are normal. Pupils are equal, round, and reactive to light.  Fundoscopic exam:      The right eye shows no papilledema.       The left eye shows no papilledema.  Neck: Normal range of motion. Neck supple.  Cardiovascular: Normal rate, regular rhythm, normal heart sounds and intact distal pulses.  Exam reveals no friction rub.   No murmur heard. Pulmonary/Chest: Effort normal and breath sounds normal. She has no wheezes. She has no rales.  Abdominal: Soft. Bowel sounds are normal. She exhibits no distension. There is no tenderness. There is no rebound and no guarding.  Musculoskeletal: Normal range of motion. She exhibits no tenderness.  No edema  Lymphadenopathy:    She has no cervical adenopathy.  Neurological: She is alert and oriented to person, place, and time. She has normal strength. No cranial nerve deficit or sensory deficit. Gait normal.  photophobia  Skin: Skin is warm and dry. No rash noted.  Psychiatric: She has a normal mood and affect. Her behavior is normal.    ED Course  Procedures (including critical care time) Labs Review Labs Reviewed - No data to display  Imaging Review No results  found.   EKG Interpretation None      MDM   Final diagnoses:  Migraine    Pt with typical migraine HA without sx suggestive of SAH(sudden onset, worst of life, or deficits), infection, or cavernous vein thrombosis.  No tic exposure.  Normal neuro exam and vital signs. Will give HA cocktail and re-eval.  10:22 AM Pt feeling better and d/ced home.  Blanchie Dessert, MD 02/03/14 [place], MD 02/03/14 1023

## 2014-03-02 ENCOUNTER — Telehealth: Payer: Self-pay | Admitting: Neurology

## 2014-03-02 NOTE — Telephone Encounter (Signed)
Left VM message for call back, concerning more information about her injury, is it headache related?
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
## 2014-03-02 NOTE — Telephone Encounter (Signed)
Patient returning Ammie's call.  Traveling and may drop call.
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
## 2014-03-08 NOTE — Telephone Encounter (Signed)
Patient called back and stated headaches worsened and experiencing dizzy spells.  PCP appointment is scheduled 7/23 and questioning if she should see neurologist.  Please call and advise.

## 2014-03-08 NOTE — Telephone Encounter (Signed)
Called and scheduled /confirmed appt.with patient for 03/09/14

## 2014-03-09 ENCOUNTER — Ambulatory Visit (INDEPENDENT_AMBULATORY_CARE_PROVIDER_SITE_OTHER): Payer: BC Managed Care – PPO | Admitting: Adult Health

## 2014-03-09 ENCOUNTER — Encounter: Payer: Self-pay | Admitting: Adult Health

## 2014-03-09 ENCOUNTER — Encounter: Payer: Self-pay | Admitting: *Deleted

## 2014-03-09 VITALS — Ht 62.5 in | Wt 275.0 lb

## 2014-03-09 DIAGNOSIS — G43009 Migraine without aura, not intractable, without status migrainosus: Secondary | ICD-10-CM

## 2014-03-09 DIAGNOSIS — G40309 Generalized idiopathic epilepsy and epileptic syndromes, not intractable, without status epilepticus: Secondary | ICD-10-CM

## 2014-03-09 DIAGNOSIS — R42 Dizziness and giddiness: Secondary | ICD-10-CM

## 2014-03-09 DIAGNOSIS — Z9181 History of falling: Secondary | ICD-10-CM | POA: Insufficient documentation

## 2014-03-09 NOTE — Patient Instructions (Signed)
Concussion  A concussion, or closed-head injury, is a brain injury caused by a direct blow to the head or by a quick and sudden movement (jolt) of the head or neck. Concussions are usually not life-threatening. Even so, the effects of a concussion can be serious. If you have had a concussion before, you are more likely to experience concussion-like symptoms after a direct blow to the head.   CAUSES  · Direct blow to the head, such as from running into another player during a soccer game, being hit in a fight, or hitting your head on a hard surface.  · A jolt of the head or neck that causes the brain to move back and forth inside the skull, such as in a car crash.  SIGNS AND SYMPTOMS  The signs of a concussion can be hard to notice. Early on, they may be missed by you, family members, and health care providers. You may look fine but act or feel differently.  Symptoms are usually temporary, but they may last for days, weeks, or even longer. Some symptoms may appear right away while others may not show up for hours or days. Every head injury is different. Symptoms include:  · Mild to moderate headaches that will not go away.  · A feeling of pressure inside your head.  · Having more trouble than usual:  ¨ Learning or remembering things you have heard.  ¨ Answering questions.  ¨ Paying attention or concentrating.  ¨ Organizing daily tasks.  ¨ Making decisions and solving problems.  · Slowness in thinking, acting or reacting, speaking, or reading.  · Getting lost or being easily confused.  · Feeling tired all the time or lacking energy (fatigued).  · Feeling drowsy.  · Sleep disturbances.  ¨ Sleeping more than usual.  ¨ Sleeping less than usual.  ¨ Trouble falling asleep.  ¨ Trouble sleeping (insomnia).  · Loss of balance or feeling lightheaded or dizzy.  · Nausea or vomiting.  · Numbness or tingling.  · Increased sensitivity to:  ¨ Sounds.  ¨ Lights.  ¨ Distractions.  · Vision problems or eyes that tire  easily.  · Diminished sense of taste or smell.  · Ringing in the ears.  · Mood changes such as feeling sad or anxious.  · Becoming easily irritated or angry for little or no reason.  · Lack of motivation.  · Seeing or hearing things other people do not see or hear (hallucinations).  DIAGNOSIS  Your health care provider can usually diagnose a concussion based on a description of your injury and symptoms. He or she will ask whether you passed out (lost consciousness) and whether you are having trouble remembering events that happened right before and during your injury.  Your evaluation might include:  · A brain scan to look for signs of injury to the brain. Even if the test shows no injury, you may still have a concussion.  · Blood tests to be sure other problems are not present.  TREATMENT  · Concussions are usually treated in an emergency department, in urgent care, or at a clinic. You may need to stay in the hospital overnight for further treatment.  · Tell your health care provider if you are taking any medicines, including prescription medicines, over-the-counter medicines, and natural remedies. Some medicines, such as blood thinners (anticoagulants) and aspirin, may increase the chance of complications. Also tell your health care provider whether you have had alcohol or are taking illegal drugs. This information   may affect treatment.  · Your health care provider will send you home with important instructions to follow.  · How fast you will recover from a concussion depends on many factors. These factors include how severe your concussion is, what part of your brain was injured, your age, and how healthy you were before the concussion.  · Most people with mild injuries recover fully. Recovery can take time. In general, recovery is slower in older persons. Also, persons who have had a concussion in the past or have other medical problems may find that it takes longer to recover from their current injury.  HOME  CARE INSTRUCTIONS  General Instructions  · Carefully follow the directions your health care provider gave you.  · Only take over-the-counter or prescription medicines for pain, discomfort, or fever as directed by your health care provider.  · Take only those medicines that your health care provider has approved.  · Do not drink alcohol until your health care provider says you are well enough to do so. Alcohol and certain other drugs may slow your recovery and can put you at risk of further injury.  · If it is harder than usual to remember things, write them down.  · If you are easily distracted, try to do one thing at a time. For example, do not try to watch TV while fixing dinner.  · Talk with family members or close friends when making important decisions.  · Keep all follow-up appointments. Repeated evaluation of your symptoms is recommended for your recovery.  · Watch your symptoms and tell others to do the same. Complications sometimes occur after a concussion. Older adults with a brain injury may have a higher risk of serious complications, such as a blood clot on the brain.  · Tell your teachers, school nurse, school counselor, coach, athletic trainer, or work manager about your injury, symptoms, and restrictions. Tell them about what you can or cannot do. They should watch for:  ¨ Increased problems with attention or concentration.  ¨ Increased difficulty remembering or learning new information.  ¨ Increased time needed to complete tasks or assignments.  ¨ Increased irritability or decreased ability to cope with stress.  ¨ Increased symptoms.  · Rest. Rest helps the brain to heal. Make sure you:  ¨ Get plenty of sleep at night. Avoid staying up late at night.  ¨ Keep the same bedtime hours on weekends and weekdays.  ¨ Rest during the day. Take daytime naps or rest breaks when you feel tired.  · Limit activities that require a lot of thought or concentration. These include:  ¨ Doing homework or job-related  work.  ¨ Watching TV.  ¨ Working on the computer.  · Avoid any situation where there is potential for another head injury (football, hockey, soccer, basketball, martial arts, downhill snow sports and horseback riding). Your condition will get worse every time you experience a concussion. You should avoid these activities until you are evaluated by the appropriate follow-up health care providers.  Returning To Your Regular Activities  You will need to return to your normal activities slowly, not all at once. You must give your body and brain enough time for recovery.  · Do not return to sports or other athletic activities until your health care provider tells you it is safe to do so.  · Ask your health care provider when you can drive, ride a bicycle, or operate heavy machinery. Your ability to react may be slower after a   brain injury. Never do these activities if you are dizzy.  · Ask your health care provider about when you can return to work or school.  Preventing Another Concussion  It is very important to avoid another brain injury, especially before you have recovered. In rare cases, another injury can lead to permanent brain damage, brain swelling, or death. The risk of this is greatest during the first 7-10 days after a head injury. Avoid injuries by:  · Wearing a seat belt when riding in a car.  · Drinking alcohol only in moderation.  · Wearing a helmet when biking, skiing, skateboarding, skating, or doing similar activities.  · Avoiding activities that could lead to a second concussion, such as contact or recreational sports, until your health care provider says it is okay.  · Taking safety measures in your home.  ¨ Remove clutter and tripping hazards from floors and stairways.  ¨ Use grab bars in bathrooms and handrails by stairs.  ¨ Place non-slip mats on floors and in bathtubs.  ¨ Improve lighting in dim areas.  SEEK MEDICAL CARE IF:  · You have increased problems paying attention or  concentrating.  · You have increased difficulty remembering or learning new information.  · You need more time to complete tasks or assignments than before.  · You have increased irritability or decreased ability to cope with stress.  · You have more symptoms than before.  Seek medical care if you have any of the following symptoms for more than 2 weeks after your injury:  · Lasting (chronic) headaches.  · Dizziness or balance problems.  · Nausea.  · Vision problems.  · Increased sensitivity to noise or light.  · Depression or mood swings.  · Anxiety or irritability.  · Memory problems.  · Difficulty concentrating or paying attention.  · Sleep problems.  · Feeling tired all the time.  SEEK IMMEDIATE MEDICAL CARE IF:  · You have severe or worsening headaches. These may be a sign of a blood clot in the brain.  · You have weakness (even if only in one hand, leg, or part of the face).  · You have numbness.  · You have decreased coordination.  · You vomit repeatedly.  · You have increased sleepiness.  · One pupil is larger than the other.  · You have convulsions.  · You have slurred speech.  · You have increased confusion. This may be a sign of a blood clot in the brain.  · You have increased restlessness, agitation, or irritability.  · You are unable to recognize people or places.  · You have neck pain.  · It is difficult to wake you up.  · You have unusual behavior changes.  · You lose consciousness.  MAKE SURE YOU:  · Understand these instructions.  · Will watch your condition.  · Will get help right away if you are not doing well or get worse.  Document Released: 11/03/2003 Document Revised: 08/18/2013 Document Reviewed: 03/05/2013  [employer]® Patient Information ©2015 [employer]. This information is not intended to replace advice given to you by your health care provider. Make sure you discuss any questions you have with your health care provider.

## 2014-03-09 NOTE — Progress Notes (Signed)
I agree with the assessment and plan as directed by NP .The patient is known to me .   Bora Bost, MD  
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
## 2014-03-17 ENCOUNTER — Other Ambulatory Visit: Payer: BC Managed Care – PPO

## 2014-03-18 ENCOUNTER — Encounter: Payer: Self-pay | Admitting: Adult Health

## 2014-03-18 ENCOUNTER — Ambulatory Visit: Payer: BC Managed Care – PPO

## 2014-03-19 ENCOUNTER — Telehealth: Payer: Self-pay | Admitting: Adult Health

## 2014-03-19 NOTE — Telephone Encounter (Signed)
I called back.  Relayed Megan's message.  She will stop in sometime next week for meds.

## 2014-03-19 NOTE — Telephone Encounter (Signed)
Patient is requesting a sedative to take prior to MRI. Which is scheduled on 08/03.  Her preferred pharmacy is [facility].  Please advise.  Thank you.

## 2014-03-19 NOTE — Telephone Encounter (Signed)
She can come to the office and pick up xanax. Please let her know. Thanks!

## 2014-03-19 NOTE — Telephone Encounter (Signed)
Patient has an MRI scheduled for 03-29-14--would like Rx called in to calm her down during MRI--[facility]

## 2014-03-22 NOTE — Telephone Encounter (Signed)
Pt's medication was left at the front desk for pt to pick up.

## 2014-03-29 ENCOUNTER — Ambulatory Visit
Admission: RE | Admit: 2014-03-29 | Discharge: 2014-03-29 | Disposition: A | Discharge status: Home / Self Care | Payer: BC Managed Care – PPO | Source: Ambulatory Visit | Attending: Adult Health | Admitting: Adult Health

## 2014-03-29 DIAGNOSIS — R42 Dizziness and giddiness: Secondary | ICD-10-CM

## 2014-03-29 MED ORDER — GADOBENATE DIMEGLUMINE 529 MG/ML IV SOLN
20.0000 mL | Freq: Once | INTRAVENOUS | Status: AC | PRN
  Administered 2014-03-29: 20 mL via INTRAVENOUS

## 2014-03-31 ENCOUNTER — Telehealth: Payer: Self-pay | Admitting: *Deleted

## 2014-03-31 MED ORDER — TIZANIDINE HCL 4 MG PO TABS: 4.0000 mg | ORAL_TABLET | Freq: Every day | ORAL | Status: DC

## 2014-03-31 NOTE — Telephone Encounter (Signed)
Called patient to relay normal MRI of the brain. Patient states that she is still having headaches and since her last visit here her vision has become very blurry. Please advise.

## 2014-03-31 NOTE — Telephone Encounter (Signed)
I called the patient. She continues to have headaches, blurry vision and dizziness. Her MRI the brain was normal. I have consulted with Dr. Brett Fairy and she feels that this is most likely a postconcussion syndrome. She states that she continues to have a dull headache daily and that's what bothers her the most. I will order tizanidine 4 mg daily at bedtime. Patient will call us if she has any further questions or concerns.

## 2014-06-09 ENCOUNTER — Ambulatory Visit (INDEPENDENT_AMBULATORY_CARE_PROVIDER_SITE_OTHER): Payer: BC Managed Care – PPO | Admitting: Adult Health

## 2014-06-09 ENCOUNTER — Encounter: Payer: Self-pay | Admitting: Adult Health

## 2014-06-09 ENCOUNTER — Encounter: Payer: Self-pay | Admitting: *Deleted

## 2014-06-09 VITALS — BP 140/78 | HR 78 | Ht 62.5 in | Wt 282.0 lb

## 2014-06-09 DIAGNOSIS — G43009 Migraine without aura, not intractable, without status migrainosus: Secondary | ICD-10-CM

## 2014-06-09 DIAGNOSIS — G40309 Generalized idiopathic epilepsy and epileptic syndromes, not intractable, without status epilepticus: Secondary | ICD-10-CM

## 2014-06-09 DIAGNOSIS — Z5181 Encounter for therapeutic drug level monitoring: Secondary | ICD-10-CM

## 2014-06-09 DIAGNOSIS — F0781 Postconcussional syndrome: Secondary | ICD-10-CM

## 2014-06-09 MED ORDER — NORTRIPTYLINE HCL 10 MG PO CAPS: 10.0000 mg | ORAL_CAPSULE | Freq: Every day | ORAL | Status: DC

## 2014-06-09 NOTE — Progress Notes (Signed)
PATIENT: [name] [name] DOB: 03-16-1966  REASON FOR VISIT: follow up HISTORY FROM: patient  HISTORY OF PRESENT ILLNESS: [name] [name] is a 48 year old female with a history of migraine and seizures. She returns today for follow-up. She was given tizanidine for her headaches. She reports that she continues to have a headache everyday. She reports that her headache is usually 8/10 on the pain scale. Confirms nausea but no vomiting. + for some photophobia but not consistent. Denies phonophobia. She states that she does not sleep well at night recently due to hurting her back. She recently injured her back. She has an appointment with [name] on Monday. Denies snoring. Denies any witness apnea events.  She also takes Lamictal and topamax for seizures. She reports that she has not had any recent seizures. She operates a Teacher, music without difficulty.   HISTORY: 48 year old female with a history if migraine and seizures. Patient currently takes Topamax and Imitrex for her headaches. She reports that on July 4th she fell off a water slide at a water park. She states that she did lose consciousness and was underwater for some time. She was taken to the ED in [place]. States that a CT of the brain indicated some bleeding on the brain but she was told that would correct itself as well as a concussion. Since then she has had a daily headache with dizziness. She states that her vision has been blurry at times. She states that her body has ached. States walking is a "task." She feels that the right side is weaker than the left side. Patient states that she doesn't have an appetite.Her sleep has been affected due to pain. No changes in bowels or bladder other than she has not been drinking a lot of fluids so her urine has been concentrated. Patient denies any falls but she has stumbled. Patient returns today for an evaluation.   REVIEW OF SYSTEMS: Full 14 system review of systems performed and  notable only for:  Constitutional: N/A  Eyes: Eye redness, light sensitivity, blurred vision Ear/Nose/Throat: N/A  Skin: N/A  Cardiovascular: N/A  Respiratory: Cough, wheezing, chest tightness Gastrointestinal: N/A  Genitourinary: N/A Hematology/Lymphatic: N/A  Endocrine: N/A Musculoskeletal: Joint pain, back pain, aching muscles, neck pain, neck stiffness Allergy/Immunology: N/A  Neurological: Dizziness, headache, numbness Psychiatric: N/A Sleep: Insomnia   ALLERGIES: Allergies  Allergen Reactions  . Amoxicillin Hives and Swelling  . Biaxin [Clarithromycin] Hives  . Erythromycin Base Hives  . Penicillins Hives and Swelling  . Zithromax [Azithromycin] Hives and Nausea Only    HOME MEDICATIONS: Outpatient Prescriptions Prior to Visit  Medication Sig Dispense Refill  . albuterol (PROVENTIL HFA;VENTOLIN HFA) 108 (90 BASE) MCG/ACT inhaler Inhale 2 puffs into the lungs every 6 (six) hours as needed for wheezing.      . clonazePAM (KLONOPIN) 1 MG tablet Take 1 mg by mouth at bedtime as needed (for sleep and if she feels a seizure coming on).      . eszopiclone (LUNESTA) 2 MG TABS tablet Take 1 tablet (2 mg total) by mouth at bedtime as needed for sleep. Take immediately before bedtime  30 tablet  5  . HYDROcodone-acetaminophen (VICODIN) 5-500 MG per tablet Take by mouth.      . lamoTRIgine (LAMICTAL) 100 MG tablet Take 1 tablet (100 mg total) by mouth 2 (two) times daily.  60 tablet  11  . ondansetron (ZOFRAN) 4 MG tablet       .  SUMAtriptan (IMITREX) 100 MG tablet Take 1 tablet (100 mg total) by mouth every 2 (two) hours as needed for migraine. Max 2 doses per 24 hours  10 tablet  6  . tiZANidine (ZANAFLEX) 4 MG tablet Take 1 tablet (4 mg total) by mouth at bedtime.  30 tablet  3  . topiramate (TOPAMAX) 100 MG tablet Take 1 tablet (100 mg total) by mouth 2 (two) times daily.  60 tablet  6   No facility-administered medications prior to visit.    PAST MEDICAL HISTORY: Past  Medical History  Diagnosis Date  . Migraine   . Seizures   . Asthma   . GERD (gastroesophageal reflux disease)   . Obesity, Class III, BMI 40-49.9 (morbid obesity) 12/21/2012    PAST SURGICAL HISTORY: Past Surgical History  Procedure Laterality Date  . Abdominal hysterectomy    . Cesarean section    . Esophagogastroduodenoscopy N/A 11/21/2012    Procedure: ESOPHAGOGASTRODUODENOSCOPY (EGD);  Surgeon: Irene Shipper, MD;  Location: [facility];  Service: Endoscopy;  Laterality: N/A;  . Colonoscopy N/A 11/21/2012    Procedure: COLONOSCOPY;  Surgeon: Irene Shipper, MD;  Location: [facility];  Service: Endoscopy;  Laterality: N/A;  . Dilation and curettage of uterus    . Hernia repair  12/17/12    laparoscopic incisional   . Incisional hernia repair N/A 12/17/2012    Procedure: LAPAROSCOPIC INCISIONAL HERNIA;  Surgeon: Harl Bowie, MD;  Location: [facility];  Service: General;  Laterality: N/A;  . Insertion of mesh N/A 12/17/2012    Procedure: INSERTION OF MESH;  Surgeon: Harl Bowie, MD;  Location: [facility];  Service: General;  Laterality: N/A;    FAMILY HISTORY: Family History  Problem Relation Age of Onset  . Hypertension Mother   . Depression Mother   . Heart attack Father   . Hypertension Father   . Emphysema Father   . COPD Father   . Hypertension Sister     SOCIAL HISTORY: History   Social History  . Marital Status: Single    Spouse Name: N/A    Number of Children: 2  . 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 Smoking status: Never Smoker   . Smokeless tobacco: Never Used  . Alcohol Use: Yes     Comment: occ glass of wine  . Drug Use: No  . Sexual Activity: Yes    Birth Control/ Protection: Surgical   Other Topics Concern  . Not on file   Social History Narrative   Patient is single with 2 children   Patient is right handed   Patient has some college    Patient does not drink caffeine            PHYSICAL EXAM  Filed Vitals:   06/09/14 1414  BP: 140/78  Pulse: 78  Height: 5' 2.5" (1.588 m)  Weight: 282 lb (127.914 kg)   Body mass index is 50.72 kg/(m^2). Generalized: Well developed, in no acute distress   Neurological examination  Mentation: Alert oriented to time, place, history taking. Follows all commands speech and language fluent. Tearful during the exam Cranial nerve II-XII: Pupils were equal round reactive to light. Extraocular movements were full, visual field were full on confrontational test. Facial sensation and strength were normal.  Uvula tongue midline. Head turning and shoulder shrug  were normal and symmetric. Motor: The motor testing  reveals 5 over 5 strength of all 4 extremities. Good symmetric motor tone is noted throughout.  Sensory: Sensory testing is intact to soft touch on all 4 extremities decreased on the left leg. No evidence of extinction is noted.  Coordination: Cerebellar testing reveals good finger-nose-finger and heel-to-shin- unable to complete on left leg Gait and station: limping gait on the left due to back injury. Tandem gait slightly unsteady. Romberg negative.  Reflexes: Deep tendon reflexes are symmetric and normal bilaterally.    DIAGNOSTIC DATA (LABS, IMAGING, TESTING) - I reviewed patient records, labs, notes, testing and imaging myself where available.  Lab Results  Component Value Date   WBC 8.4 10/05/2013   HGB 13.2 10/05/2013   HCT 39.2 10/05/2013   MCV 82.2 10/05/2013   PLT 189 10/05/2013      Component Value Date/Time   NA 141 10/05/2013 2022   K 3.7 10/05/2013 2022   CL 107 10/05/2013 2022   CO2 19 10/05/2013 2022   GLUCOSE 95 10/05/2013 2022   BUN 15 10/05/2013 2022   CREATININE 0.84 10/05/2013 2022   CREATININE 0.82 06/22/2013 1152   CALCIUM 8.9 10/05/2013 2022   PROT 8.7* 08/04/2013 1840   ALBUMIN 4.1 08/04/2013 1840   AST 23 08/04/2013 1840   ALT 29 08/04/2013 1840   ALKPHOS 111 08/04/2013  1840   BILITOT 0.4 08/04/2013 1840   GFRNONAA 82* 10/05/2013 2022   GFRAA >90 10/05/2013 2022   Lab Results  Component Value Date   CHOL 140 06/22/2013   HDL 48 06/22/2013   LDLCALC  Value: 98        Total Cholesterol/HDL:CHD Risk Coronary Heart Disease Risk Table                     Men   Women  1/2 Average Risk   3.4   3.3 05/23/2007   TRIG 72 05/23/2007   CHOLHDL 4.4 05/23/2007   Lab Results  Component Value Date   HGBA1C 5.7* 06/22/2013    Lab Results  Component Value Date   TSH 0.752 06/22/2013      ASSESSMENT AND PLAN 48 y.o. year old female  has a past medical history of Migraine; Seizures; Asthma; GERD (gastroesophageal reflux disease); and Obesity, Class III, BMI 40-49.9 (morbid obesity) (12/21/2012). here with:  1. Migraines  2. Post concussive syndrome 3. Seizures  Patient continues to have daily headaches. She is currently taking tizanidine but that has not offered any relief. I will try the patient on Nortriptyline 10 mg at bedtime. Patient will let me know if this medication is not beneficial at this dose. Her migraines started after suffering a head injury while in [place] in July. Her headaches could be the result of a post concussive syndrome. She is currently on Lamictal and topamax for seizures. She states that her seizures have been very well controlled. I will check blood work today. She is currently suffering with a back injury. She is very tearful during the exam due to pain. She has an appointment on Monday with [facility] regarding her back pain.   Ward Givens, MSN, NP-C 06/09/2014, 2:40 PM [facility] [address] [place], [place] [address] [phone]  Note: This document was prepared with digital dictation and possible smart phrase technology. Any transcriptional errors that result from this process are unintentional.

## 2014-06-09 NOTE — Patient Instructions (Signed)

## 2014-06-10 ENCOUNTER — Ambulatory Visit: Payer: BC Managed Care – PPO | Admitting: Adult Health

## 2014-06-10 NOTE — Progress Notes (Signed)
I agree with the assessment and plan as directed by NP .The patient is known to me .   Stormee Duda, MD  

## 2014-06-11 ENCOUNTER — Telehealth: Payer: Self-pay | Admitting: Adult Health

## 2014-06-11 LAB — CBC WITH DIFFERENTIAL
BASOS ABS: 0 10*3/uL (ref 0.0–0.2)
Basos: 0 %
EOS ABS: 0.1 10*3/uL (ref 0.0–0.4)
Eos: 2 %
HCT: 39.5 % (ref 34.0–46.6)
HEMOGLOBIN: 13.3 g/dL (ref 11.1–15.9)
IMMATURE GRANS (ABS): 0 10*3/uL (ref 0.0–0.1)
IMMATURE GRANULOCYTES: 0 %
LYMPHS: 45 %
Lymphocytes Absolute: 3.1 10*3/uL (ref 0.7–3.1)
MCH: 27.6 pg (ref 26.6–33.0)
MCHC: 33.7 g/dL (ref 31.5–35.7)
MCV: 82 fL (ref 79–97)
MONOS ABS: 0.6 10*3/uL (ref 0.1–0.9)
Monocytes: 8 %
NEUTROS PCT: 45 %
Neutrophils Absolute: 3.1 10*3/uL (ref 1.4–7.0)
Platelets: 190 10*3/uL (ref 150–379)
RBC: 4.82 x10E6/uL (ref 3.77–5.28)
RDW: 15.7 % — ABNORMAL HIGH (ref 12.3–15.4)
WBC: 6.9 10*3/uL (ref 3.4–10.8)

## 2014-06-11 LAB — LAMOTRIGINE LEVEL: LAMOTRIGINE LVL: NOT DETECTED ug/mL (ref 2.0–20.0)

## 2014-06-11 LAB — COMPREHENSIVE METABOLIC PANEL
ALT: 12 IU/L (ref 0–32)
AST: 12 IU/L (ref 0–40)
Albumin/Globulin Ratio: 1.3 (ref 1.1–2.5)
Albumin: 4 g/dL (ref 3.5–5.5)
Alkaline Phosphatase: 121 IU/L — ABNORMAL HIGH (ref 39–117)
BILIRUBIN TOTAL: 0.3 mg/dL (ref 0.0–1.2)
BUN/Creatinine Ratio: 14 (ref 9–23)
BUN: 11 mg/dL (ref 6–24)
CALCIUM: 8.8 mg/dL (ref 8.7–10.2)
CO2: 22 mmol/L (ref 18–29)
Chloride: 106 mmol/L (ref 97–108)
Creatinine, Ser: 0.81 mg/dL (ref 0.57–1.00)
GFR calc Af Amer: 100 mL/min/{1.73_m2} (ref >59)
GFR, EST NON AFRICAN AMERICAN: 87 mL/min/{1.73_m2} (ref >59)
GLOBULIN, TOTAL: 3 g/dL (ref 1.5–4.5)
Glucose: 82 mg/dL (ref 65–99)
POTASSIUM: 4 mmol/L (ref 3.5–5.2)
SODIUM: 144 mmol/L (ref 134–144)
Total Protein: 7 g/dL (ref 6.0–8.5)

## 2014-06-11 NOTE — Telephone Encounter (Signed)
I called the patient regarding her lab results. Her Lamictal level showed none detected. Patient states that she has been taking it everyday. I will recheck this at her next visit.

## 2014-06-11 NOTE — Telephone Encounter (Signed)
I called the patient. She was concerned that the Lamictal level showed none detected. I explained that as long as she wasn't having seizures then we would just recheck it at the next visit. She verbalized understanding.

## 2014-06-11 NOTE — Telephone Encounter (Signed)
Patient calling to speak with Jinny Blossom, please call her back at [phone].

## 2014-06-16 ENCOUNTER — Encounter: Payer: Self-pay | Admitting: Adult Health

## 2014-06-22 ENCOUNTER — Other Ambulatory Visit: Payer: Self-pay

## 2014-06-22 MED ORDER — LAMOTRIGINE 100 MG PO TABS: 100.0000 mg | ORAL_TABLET | Freq: Two times a day (BID) | ORAL | Status: DC

## 2014-06-22 MED ORDER — TOPIRAMATE 100 MG PO TABS: 100.0000 mg | ORAL_TABLET | Freq: Two times a day (BID) | ORAL | Status: DC

## 2014-06-23 ENCOUNTER — Ambulatory Visit: Payer: BC Managed Care – PPO | Admitting: Obstetrics & Gynecology

## 2014-07-12 ENCOUNTER — Encounter: Payer: Self-pay | Admitting: Adult Health

## 2014-07-12 MED ORDER — NORTRIPTYLINE HCL 10 MG PO CAPS: 20.0000 mg | ORAL_CAPSULE | Freq: Every day | ORAL | Status: DC

## 2014-07-12 NOTE — Telephone Encounter (Signed)
I called the patient. She reports that she was at the chiropractor on November 3 and became dizzy and ended up passing out. She states that she had received treatment from the chiropractor. She had received electric stimulation and stretching on her back. She states when they were done she opened her eyes and sit up on the side of the bed and became dizzy. She states that she tried to stand up and the chiropractor told her later that she completely passed out and he eased her to the floor. The patient has had dizzy spells in the past after suffering a concussion. I am not sure if her dizziness is a benign positional vertigo or if it is related to her concussion/migraines. I have advised her to follow-up with her primary care provider regarding her syncopal episode and the dizziness. I will see the patient back in the next month to evaluate for possible positional vertigo. The patient has had an MRI of the brain in August that was unremarkable. She also reports that her headaches have not improved with the nortriptyline. She continues to have a daily headache. I will increase the nortriptyline to 20 mg at bedtime.

## 2014-07-14 ENCOUNTER — Ambulatory Visit: Payer: Self-pay | Admitting: Family Medicine

## 2014-07-15 ENCOUNTER — Telehealth: Payer: Self-pay

## 2014-07-15 NOTE — Telephone Encounter (Signed)
Spoke to patient. Scheduled appt for 08/17/14 per NP- Megan.

## 2014-07-26 ENCOUNTER — Ambulatory Visit: Payer: BC Managed Care – PPO | Admitting: Obstetrics & Gynecology

## 2014-07-28 ENCOUNTER — Other Ambulatory Visit: Payer: Self-pay | Admitting: Anesthesiology

## 2014-07-28 DIAGNOSIS — M545 Low back pain: Secondary | ICD-10-CM

## 2014-08-03 ENCOUNTER — Encounter: Payer: Self-pay | Admitting: Adult Health

## 2014-08-05 ENCOUNTER — Ambulatory Visit
Admission: RE | Admit: 2014-08-05 | Discharge: 2014-08-05 | Disposition: A | Discharge status: Home / Self Care | Payer: BC Managed Care – PPO | Source: Ambulatory Visit | Attending: Anesthesiology | Admitting: Anesthesiology

## 2014-08-05 DIAGNOSIS — M545 Low back pain: Secondary | ICD-10-CM

## 2014-08-17 ENCOUNTER — Ambulatory Visit (INDEPENDENT_AMBULATORY_CARE_PROVIDER_SITE_OTHER): Payer: BC Managed Care – PPO | Admitting: Adult Health

## 2014-08-17 ENCOUNTER — Encounter: Payer: Self-pay | Admitting: Adult Health

## 2014-08-17 VITALS — BP 122/79 | HR 72 | Temp 98.3°F | Ht 62.5 in | Wt 278.2 lb

## 2014-08-17 DIAGNOSIS — G40309 Generalized idiopathic epilepsy and epileptic syndromes, not intractable, without status epilepticus: Secondary | ICD-10-CM

## 2014-08-17 DIAGNOSIS — G43009 Migraine without aura, not intractable, without status migrainosus: Secondary | ICD-10-CM

## 2014-08-17 DIAGNOSIS — Z5181 Encounter for therapeutic drug level monitoring: Secondary | ICD-10-CM

## 2014-08-17 MED ORDER — NORTRIPTYLINE HCL 10 MG PO CAPS: 30.0000 mg | ORAL_CAPSULE | Freq: Every day | ORAL | Status: DC

## 2014-08-17 NOTE — Progress Notes (Signed)
PATIENT: [name] [name] DOB: May 16, 1966  REASON FOR VISIT: follow up HISTORY FROM: patient  HISTORY OF PRESENT ILLNESS: [name] [name] is a 48 year old female with a history of migraines and seizures. She returns today for follow-up. She is currently taking Lamictal and Topamax for seizures. Her seizures have been controlled. Blood work at her last visit revealed a Lamictal level that was "none detected." However the patient states that she has been taking her Lamictal. She was started on nortriptyline 20 mg at bedtime for headaches. She states that this has not been beneficial for her headaches. She states that some days her headaches are so bad that nothing makes it better. Patient continues to have back pain. She recently had an MRI of the lumbar spine complete by MD at [facility]. She states that she has an appointment January 6th to go over those results. She states that she has numbness in the hands and feet that they are evaluating.   HISTORY 06/09/14: 48 year old female with a history of migraine and seizures. She returns today for follow-up. She was given tizanidine for her headaches. She reports that she continues to have a headache everyday. She reports that her headache is usually 8/10 on the pain scale. Confirms nausea but no vomiting. + for some photophobia but not consistent. Denies phonophobia. She states that she does not sleep well at night recently due to hurting her back. She recently injured her back. She has an appointment with [facility] on Monday. Denies snoring. Denies any witness apnea events. She also takes Lamictal and topamax for seizures. She reports that she has not had any recent seizures. She operates a Teacher, music without difficulty.   HISTORY: 48 year old female with a history if migraine and seizures. Patient currently takes Topamax and Imitrex for her headaches. She reports that on July 4th she fell off a water slide at a water park. She states  that she did lose consciousness and was underwater for some time. She was taken to the ED in [place]. States that a CT of the brain indicated some bleeding on the brain but she was told that would correct itself as well as a concussion. Since then she has had a daily headache with dizziness. She states that her vision has been blurry at times. She states that her body has ached. States walking is a "task." She feels that the right side is weaker than the left side. Patient states that she doesn't have an appetite.Her sleep has been affected due to pain. No changes in bowels or bladder other than she has not been drinking a lot of fluids so her urine has been concentrated. Patient denies any falls but she has stumbled. Patient returns today for an evaluation.   REVIEW OF SYSTEMS: Out of a complete 14 system review of symptoms, the patient complains only of the following symptoms, and all other reviewed systems are negative.  Blurred vision, back pain, neck pain, , Dizziness, headache, numbness, passing out  ALLERGIES: Allergies  Allergen Reactions  . Amoxicillin Hives and Swelling  . Biaxin [Clarithromycin] Hives  . Erythromycin Base Hives  . Penicillins Hives and Swelling  . Zithromax [Azithromycin] Hives and Nausea Only    HOME MEDICATIONS: Outpatient Prescriptions Prior to Visit  Medication Sig Dispense Refill  . albuterol (PROVENTIL HFA;VENTOLIN HFA) 108 (90 BASE) MCG/ACT inhaler Inhale 2 puffs into the lungs every 6 (six) hours as needed for wheezing.    . clonazePAM (KLONOPIN)  1 MG tablet Take 1 mg by mouth at bedtime as needed (for sleep and if she feels a seizure coming on).    . eszopiclone (LUNESTA) 2 MG TABS tablet Take 1 tablet (2 mg total) by mouth at bedtime as needed for sleep. Take immediately before bedtime 30 tablet 5  . HYDROcodone-acetaminophen (VICODIN) 5-500 MG per tablet Take by mouth.    . lamoTRIgine (LAMICTAL) 100 MG tablet Take 1 tablet (100 mg total) by mouth 2  (two) times daily. 60 tablet 6  . nortriptyline (PAMELOR) 10 MG capsule Take 2 capsules (20 mg total) by mouth at bedtime. 60 capsule 3  . ondansetron (ZOFRAN) 4 MG tablet     . SUMAtriptan (IMITREX) 100 MG tablet Take 1 tablet (100 mg total) by mouth every 2 (two) hours as needed for migraine. Max 2 doses per 24 hours 10 tablet 6  . topiramate (TOPAMAX) 100 MG tablet Take 1 tablet (100 mg total) by mouth 2 (two) times daily. 60 tablet 6   No facility-administered medications prior to visit.    PAST MEDICAL HISTORY: Past Medical History  Diagnosis Date  . Migraine   . Seizures   . Asthma   . GERD (gastroesophageal reflux disease)   . Obesity, Class III, BMI 40-49.9 (morbid obesity) 12/21/2012    PAST SURGICAL HISTORY: Past Surgical History  Procedure Laterality Date  . Abdominal hysterectomy    . Cesarean section    . Esophagogastroduodenoscopy N/A 11/21/2012    Procedure: ESOPHAGOGASTRODUODENOSCOPY (EGD);  Surgeon: Irene Shipper, MD;  Location: [facility];  Service: Endoscopy;  Laterality: N/A;  . Colonoscopy N/A 11/21/2012    Procedure: COLONOSCOPY;  Surgeon: Irene Shipper, MD;  Location: [facility];  Service: Endoscopy;  Laterality: N/A;  . Dilation and curettage of uterus    . Hernia repair  12/17/12    laparoscopic incisional   . Incisional hernia repair N/A 12/17/2012    Procedure: LAPAROSCOPIC INCISIONAL HERNIA;  Surgeon: Harl Bowie, MD;  Location: [facility];  Service: General;  Laterality: N/A;  . Insertion of mesh N/A 12/17/2012    Procedure: INSERTION OF MESH;  Surgeon: Harl Bowie, MD;  Location: [facility];  Service: General;  Laterality: N/A;    FAMILY HISTORY: Family History  Problem Relation Age of Onset  . Hypertension Mother   . Depression Mother   . Heart attack Father   . Hypertension Father   . Emphysema Father   . COPD Father   . Hypertension Sister     SOCIAL HISTORY: History   Social History  . Marital Status: Divorced    Spouse Name:  N/A    Number of Children: 2  . 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 Smoking status: Never Smoker   . Smokeless tobacco: Never Used  . Alcohol Use: Yes     Comment: occ glass of wine  . Drug Use: No  . Sexual Activity: Yes    Birth Control/ Protection: Surgical   Other Topics Concern  . Not on file   Social History Narrative   Patient is single with 2 children   Patient is right handed   Patient has some college   Patient does not drink caffeine            PHYSICAL EXAM  Filed Vitals:   08/17/14 1444  BP: 122/79  Pulse: 72  Temp:  98.3 F (36.8 C)  Height: 5' 2.5" (1.588 m)  Weight: 278 lb 3.2 oz (126.191 kg)   Body mass index is 50.04 kg/(m^2).  Generalized: Well developed, in no acute distress   Neurological examination  Mentation: Alert oriented to time, place, history taking. Follows all commands speech and language fluent Cranial nerve II-XII: Pupils were equal round reactive to light. Extraocular movements were full, visual field were full on confrontational test. Facial sensation and strength were normal. Uvula tongue midline. Head turning and shoulder shrug  were normal and symmetric. Motor: The motor testing reveals 5 over 5 strength of all 4 extremities. Good symmetric motor tone is noted throughout.  Giveaway weakness noted in the ankles bilaterally. Sensory: Sensory testing is intact to soft touch on all 4 extremities. Patient flinches with pain when touching the hands or ankles with slight pressure. No evidence of extinction is noted.  Coordination: Cerebellar testing reveals good finger-nose-finger and heel-to-shin bilaterally.  Gait and station: Gait is normal. Tandem gait unsteady.Romberg is negative. No drift is seen.  Reflexes: Deep tendon reflexes are symmetric and normal bilaterally.    DIAGNOSTIC DATA (LABS, IMAGING, TESTING) - I reviewed patient  records, labs, notes, testing and imaging myself where available.  Lab Results  Component Value Date   WBC 6.9 06/09/2014   HGB 13.3 06/09/2014   HCT 39.5 06/09/2014   MCV 82 06/09/2014   PLT 190 06/09/2014      Component Value Date/Time   NA 144 06/09/2014 1514   NA 141 10/05/2013 2022   K 4.0 06/09/2014 1514   CL 106 06/09/2014 1514   CO2 22 06/09/2014 1514   GLUCOSE 82 06/09/2014 1514   GLUCOSE 95 10/05/2013 2022   BUN 11 06/09/2014 1514   BUN 15 10/05/2013 2022   CREATININE 0.81 06/09/2014 1514   CREATININE 0.82 06/22/2013 1152   CALCIUM 8.8 06/09/2014 1514   PROT 7.0 06/09/2014 1514   PROT 8.7* 08/04/2013 1840   ALBUMIN 4.1 08/04/2013 1840   AST 12 06/09/2014 1514   ALT 12 06/09/2014 1514   ALKPHOS 121* 06/09/2014 1514   BILITOT 0.3 06/09/2014 1514   GFRNONAA 87 06/09/2014 1514   GFRAA 100 06/09/2014 1514   Lab Results  Component Value Date   CHOL 140 06/22/2013   HDL 48 06/22/2013   LDLCALC  05/23/2007    98        Total Cholesterol/HDL:CHD Risk Coronary Heart Disease Risk Table                     Men   Women  1/2 Average Risk   3.4   3.3   TRIG 72 05/23/2007   CHOLHDL 4.4 05/23/2007   Lab Results  Component Value Date   HGBA1C 5.7* 06/22/2013   No results found for: [id number] Lab Results  Component Value Date   TSH 0.752 06/22/2013      ASSESSMENT AND PLAN 48 y.o. year old female  has a past medical history of Migraine; Seizures; Asthma; GERD (gastroesophageal reflux disease); and Obesity, Class III, BMI 40-49.9 (morbid obesity) (12/21/2012). here with   1. Seizures 2. Migraines  Patient's seizures have been controlled on Lamictal and Topamax. Her last Lamictal level showed "none detected." I will recheck a limited level today. The patient states that her headaches have not improved. She has been taking nortriptyline 20 mg at bedtime. I will increase the nortriptyline to 30 mg at bedtime. The patient is also having significant back pain that is  causing her discomfort. This is being managed by [facility]. If the patient's symptoms worsen or she develops new symptoms she should let us know. Otherwise she'll follow up in 3 months with Dr. Brett Fairy.  Ward Givens, MSN, NP-C 08/17/2014, 2:41 PM [facility] [address], [place], [place] [address] [phone]  Note: This document was prepared with digital dictation and possible smart phrase technology. Any transcriptional errors that result from this process are unintentional.

## 2014-08-17 NOTE — Patient Instructions (Signed)
Increase Nortriptyline to 30 mg daily.  Continue Topamax and Lamictal.  I will check blood work today. Please call if the increase in nortriptyline is not beneficial.

## 2014-08-19 LAB — LAMOTRIGINE LEVEL: LAMOTRIGINE LVL: 4.8 ug/mL (ref 2.0–20.0)

## 2014-08-23 ENCOUNTER — Encounter: Payer: Self-pay | Admitting: *Deleted

## 2014-08-23 ENCOUNTER — Telehealth: Payer: Self-pay | Admitting: Adult Health

## 2014-08-23 NOTE — Telephone Encounter (Signed)
I called the patient. She is inquiring about an email that she sent to Dr. Brett Fairy and myself. The patient is requesting to have a DTI-Diffusion Tensor Imaging or a Spect Imaging-Single-Photon Emission Computed Tomography scan completed at the request of her attorney. I explained that Dr. Brett Fairy was going to consult with Dr. Leta Baptist in regards to the scans. I will forward this message to Dr. Brett Fairy to see if she has any additional information.

## 2014-08-23 NOTE — Telephone Encounter (Signed)
-----   Message from [place], [place] sent at 08/23/2014  2:33 PM EST ----- Regarding: patient wants to speek with you plz call when u get freetime   ----- Message -----    From: Ward Givens, NP    Sent: 08/22/2014   1:55 PM      To: Rodrick A Morehead, CMA  Lamictal level in normal range. Please call the patient.

## 2014-08-23 NOTE — Progress Notes (Signed)
I agree with the assessment and plan as directed by NP .The patient is known to me .   Twilia Yaklin, MD  

## 2014-08-24 ENCOUNTER — Encounter: Payer: Self-pay | Admitting: Obstetrics & Gynecology

## 2014-08-24 NOTE — Telephone Encounter (Signed)
Let's discuss in AM in office. -VRP

## 2014-08-24 NOTE — Telephone Encounter (Signed)
[name] [name], please tell me if any of these tests are appropriate for this patient? [name] [name]

## 2014-08-25 NOTE — Telephone Encounter (Signed)
Are you here tomorrow?

## 2014-09-02 ENCOUNTER — Encounter: Payer: Self-pay | Admitting: Neurology

## 2014-09-05 ENCOUNTER — Emergency Department (HOSPITAL_BASED_OUTPATIENT_CLINIC_OR_DEPARTMENT_OTHER)
Admission: EM | Admit: 2014-09-05 | Discharge: 2014-09-05 | Disposition: A | Discharge status: Home / Self Care | Payer: BC Managed Care – PPO | Attending: Emergency Medicine | Admitting: Emergency Medicine

## 2014-09-05 ENCOUNTER — Encounter (HOSPITAL_BASED_OUTPATIENT_CLINIC_OR_DEPARTMENT_OTHER): Payer: Self-pay | Admitting: *Deleted

## 2014-09-05 ENCOUNTER — Emergency Department (HOSPITAL_BASED_OUTPATIENT_CLINIC_OR_DEPARTMENT_OTHER): Payer: BC Managed Care – PPO

## 2014-09-05 DIAGNOSIS — Z8719 Personal history of other diseases of the digestive system: Secondary | ICD-10-CM | POA: Diagnosis not present

## 2014-09-05 DIAGNOSIS — J45909 Unspecified asthma, uncomplicated: Secondary | ICD-10-CM | POA: Diagnosis not present

## 2014-09-05 DIAGNOSIS — Z880 Allergy status to penicillin: Secondary | ICD-10-CM | POA: Diagnosis not present

## 2014-09-05 DIAGNOSIS — G40909 Epilepsy, unspecified, not intractable, without status epilepticus: Secondary | ICD-10-CM | POA: Diagnosis not present

## 2014-09-05 DIAGNOSIS — R05 Cough: Secondary | ICD-10-CM | POA: Diagnosis present

## 2014-09-05 DIAGNOSIS — J111 Influenza due to unidentified influenza virus with other respiratory manifestations: Secondary | ICD-10-CM

## 2014-09-05 DIAGNOSIS — G43909 Migraine, unspecified, not intractable, without status migrainosus: Secondary | ICD-10-CM | POA: Diagnosis not present

## 2014-09-05 DIAGNOSIS — R69 Illness, unspecified: Secondary | ICD-10-CM

## 2014-09-05 DIAGNOSIS — E669 Obesity, unspecified: Secondary | ICD-10-CM | POA: Diagnosis not present

## 2014-09-05 DIAGNOSIS — R059 Cough, unspecified: Secondary | ICD-10-CM

## 2014-09-05 LAB — BASIC METABOLIC PANEL
ANION GAP: 5 (ref 5–15)
BUN: 14 mg/dL (ref 6–23)
CALCIUM: 8.9 mg/dL (ref 8.4–10.5)
CO2: 22 mmol/L (ref 19–32)
CREATININE: 0.83 mg/dL (ref 0.50–1.10)
Chloride: 109 mEq/L (ref 96–112)
GFR, EST NON AFRICAN AMERICAN: 83 mL/min — AB (ref >90)
GLUCOSE: 100 mg/dL — AB (ref 70–99)
Potassium: 4 mmol/L (ref 3.5–5.1)
SODIUM: 136 mmol/L (ref 135–145)

## 2014-09-05 LAB — CBC WITH DIFFERENTIAL/PLATELET
BASOS PCT: 0 % (ref 0–1)
Basophils Absolute: 0 10*3/uL (ref 0.0–0.1)
Eosinophils Absolute: 0 10*3/uL (ref 0.0–0.7)
Eosinophils Relative: 1 % (ref 0–5)
HEMATOCRIT: 42.5 % (ref 36.0–46.0)
Hemoglobin: 13.8 g/dL (ref 12.0–15.0)
LYMPHS ABS: 2.9 10*3/uL (ref 0.7–4.0)
Lymphocytes Relative: 51 % — ABNORMAL HIGH (ref 12–46)
MCH: 27.4 pg (ref 26.0–34.0)
MCHC: 32.5 g/dL (ref 30.0–36.0)
MCV: 84.5 fL (ref 78.0–100.0)
MONO ABS: 0.7 10*3/uL (ref 0.1–1.0)
MONOS PCT: 12 % (ref 3–12)
NEUTROS PCT: 37 % — AB (ref 43–77)
Neutro Abs: 2.1 10*3/uL (ref 1.7–7.7)
Platelets: 194 10*3/uL (ref 150–400)
RBC: 5.03 MIL/uL (ref 3.87–5.11)
RDW: 14.5 % (ref 11.5–15.5)
WBC: 5.6 10*3/uL (ref 4.0–10.5)

## 2014-09-05 LAB — URINALYSIS, ROUTINE W REFLEX MICROSCOPIC
Bilirubin Urine: NEGATIVE
Glucose, UA: NEGATIVE mg/dL
HGB URINE DIPSTICK: NEGATIVE
Ketones, ur: NEGATIVE mg/dL
Leukocytes, UA: NEGATIVE
NITRITE: NEGATIVE
Protein, ur: NEGATIVE mg/dL
SPECIFIC GRAVITY, URINE: 1.022 (ref 1.005–1.030)
Urobilinogen, UA: 1 mg/dL (ref 0.0–1.0)
pH: 6.5 (ref 5.0–8.0)

## 2014-09-05 LAB — I-STAT CG4 LACTIC ACID, ED: LACTIC ACID, VENOUS: 0.54 mmol/L (ref 0.5–2.2)

## 2014-09-05 MED ORDER — OSELTAMIVIR PHOSPHATE 75 MG PO CAPS: 75.0000 mg | ORAL_CAPSULE | Freq: Two times a day (BID) | ORAL | Status: DC

## 2014-09-05 MED ORDER — ONDANSETRON HCL 4 MG PO TABS: 4.0000 mg | ORAL_TABLET | Freq: Four times a day (QID) | ORAL | Status: DC

## 2014-09-05 MED ORDER — OSELTAMIVIR PHOSPHATE 75 MG PO CAPS
75.0000 mg | ORAL_CAPSULE | Freq: Once | ORAL | Status: AC
  Administered 2014-09-05: 75 mg via ORAL
  Filled 2014-09-05: qty 1

## 2014-09-05 MED ORDER — MORPHINE SULFATE 4 MG/ML IJ SOLN
4.0000 mg | Freq: Once | INTRAMUSCULAR | Status: AC
  Administered 2014-09-05: 4 mg via INTRAVENOUS
  Filled 2014-09-05: qty 1

## 2014-09-05 MED ORDER — ONDANSETRON HCL 4 MG/2ML IJ SOLN
4.0000 mg | Freq: Once | INTRAMUSCULAR | Status: AC
  Administered 2014-09-05: 4 mg via INTRAVENOUS
  Filled 2014-09-05: qty 2

## 2014-09-05 MED ORDER — OXYCODONE-ACETAMINOPHEN 5-325 MG PO TABS: ORAL_TABLET | ORAL | Status: DC

## 2014-09-05 MED ORDER — HYDROMORPHONE HCL 1 MG/ML IJ SOLN
0.5000 mg | Freq: Once | INTRAMUSCULAR | Status: AC
  Administered 2014-09-05: 0.5 mg via INTRAVENOUS
  Filled 2014-09-05: qty 1

## 2014-09-05 MED ORDER — SODIUM CHLORIDE 0.9 % IV BOLUS (SEPSIS)
1000.0000 mL | Freq: Once | INTRAVENOUS | Status: AC
  Administered 2014-09-05: 1000 mL via INTRAVENOUS

## 2014-09-05 MED ORDER — ALBUTEROL SULFATE (2.5 MG/3ML) 0.083% IN NEBU
5.0000 mg | INHALATION_SOLUTION | Freq: Once | RESPIRATORY_TRACT | Status: AC
  Administered 2014-09-05: 5 mg via RESPIRATORY_TRACT
  Filled 2014-09-05: qty 6

## 2014-09-05 NOTE — ED Notes (Signed)
Patient having congestion, productive cough, chills, fever, body aches, and fatigue

## 2014-09-05 NOTE — Discharge Instructions (Signed)
Return to the emergency room for any worsening or concerning symptoms including fast breathing, heart racing, confusion, vomiting.  Rest, cover your mouth when you cough and wash your hands frequently.   Push fluids: water or Gatorade, do not drink any soda, juice or caffeinated beverages.  For fever and pain control you can take Motrin (ibuprofen) as follows: 400 mg (this is normally 2 over the counter pills) every 4 hours with food.  Do not return to work until a day after your fever breaks.   Take percocet for breakthrough pain/cough, do not drink alcohol, drive, care for children or do other critical tasks while taking percocet.  Influenza Influenza ("the flu") is a viral infection of the respiratory tract. It occurs more often in winter months because people spend more time in close contact with one another. Influenza can make you feel very sick. Influenza easily spreads from person to person (contagious). CAUSES  Influenza is caused by a virus that infects the respiratory tract. You can catch the virus by breathing in droplets from an infected person's cough or sneeze. You can also catch the virus by touching something that was recently contaminated with the virus and then touching your mouth, nose, or eyes. RISKS AND COMPLICATIONS You may be at risk for a more severe case of influenza if you smoke cigarettes, have diabetes, have chronic heart disease (such as heart failure) or lung disease (such as asthma), or if you have a weakened immune system. Elderly people and pregnant women are also at risk for more serious infections. The most common problem of influenza is a lung infection (pneumonia). Sometimes, this problem can require emergency medical care and may be life threatening. SIGNS AND SYMPTOMS  Symptoms typically last 4 to 10 days and may include:  Fever.  Chills.  Headache, body aches, and muscle aches.  Sore throat.  Chest discomfort and cough.  Poor  appetite.  Weakness or feeling tired.  Dizziness.  Nausea or vomiting. DIAGNOSIS  Diagnosis of influenza is often made based on your history and a physical exam. A nose or throat swab test can be done to confirm the diagnosis. TREATMENT  In mild cases, influenza goes away on its own. Treatment is directed at relieving symptoms. For more severe cases, your health care provider may prescribe antiviral medicines to shorten the sickness. Antibiotic medicines are not effective because the infection is caused by a virus, not by bacteria. HOME CARE INSTRUCTIONS  Take medicines only as directed by your health care provider.  Use a cool mist humidifier to make breathing easier.  Get plenty of rest until your temperature returns to normal. This usually takes 3 to 4 days.  Drink enough fluid to keep your urine clear or pale yellow.  Cover yourmouth and nosewhen coughing or sneezing,and wash your handswellto prevent thevirusfrom spreading.  Stay homefromwork orschool untilthe fever is gonefor at least 42full day. PREVENTION  An annual influenza vaccination (flu shot) is the best way to avoid getting influenza. An annual flu shot is now routinely recommended for all adults in the [place] IF:  You experiencechest pain, yourcough worsens,or you producemore mucus.  Youhave nausea,vomiting, ordiarrhea.  Your fever returns or gets worse. SEEK IMMEDIATE MEDICAL CARE IF:  You havetrouble breathing, you become short of breath,or your skin ornails becomebluish.  You have severe painor stiffnessin the neck.  You develop a sudden headache, or pain in the face or ear.  You have nausea or vomiting that you cannot control. MAKE  SURE YOU:   Understand these instructions.  Will watch your condition.  Will get help right away if you are not doing well or get worse. Document Released: 08/10/2000 Document Revised: 12/28/2013 Document Reviewed:  11/12/2011 [facility] Patient Information 2015 [place], [place]. This information is not intended to replace advice given to you by your health care provider. Make sure you discuss any questions you have with your health care provider.

## 2014-09-05 NOTE — ED Notes (Signed)
Pt waiting in the waiting room for a ride; security aware.

## 2014-09-05 NOTE — ED Provider Notes (Signed)
CSN: [id number]     Arrival date & time 09/05/14  1039 History   First MD Initiated Contact with Patient 09/05/14 1206     Chief Complaint  Patient presents with  . Cough     (Consider location/radiation/quality/duration/timing/severity/associated sxs/prior Treatment) HPI   [name] [name] is a 49 y.o. female with past medical history significant for asthma, seizure disorder complaining of fever with MAXIMUM TEMPERATURE of 102, per cough productive of green phlegm, diffuse headache, diffuse anterior pleuritic chest pain (described as aching, 8 out of 10, diffuse myalgia, generalized fatigue, and nausea all symptoms started 2 days ago. Patient denies focal abdominal pain, cervicalgia, rash, sick contacts. States that she did get her flu shot this year. She has been eating less than normal but trying to stay hydrated, normal urine output, no dysuria, hematuria, urinary frequency, concentrated or foul-smelling urine. States that she feels like her asthma is being exacerbated with chest tightness and shortness of breath, she endorses a dyspnea on exertion starting 2 days ago. States that she feels winded when she climbs to the top of her steps to get into her condo. She denies orthopnea or PND.  Past Medical History  Diagnosis Date  . Migraine   . Seizures   . Asthma   . GERD (gastroesophageal reflux disease)   . Obesity, Class III, BMI 40-49.9 (morbid obesity) 12/21/2012   Past Surgical History  Procedure Laterality Date  . Abdominal hysterectomy    . Cesarean section    . Esophagogastroduodenoscopy N/A 11/21/2012    Procedure: ESOPHAGOGASTRODUODENOSCOPY (EGD);  Surgeon: Irene Shipper, MD;  Location: [facility];  Service: Endoscopy;  Laterality: N/A;  . Colonoscopy N/A 11/21/2012    Procedure: COLONOSCOPY;  Surgeon: Irene Shipper, MD;  Location: [facility];  Service: Endoscopy;  Laterality: N/A;  . Dilation and curettage of uterus    . Hernia repair  12/17/12    laparoscopic incisional    . Incisional hernia repair N/A 12/17/2012    Procedure: LAPAROSCOPIC INCISIONAL HERNIA;  Surgeon: Harl Bowie, MD;  Location: [facility];  Service: General;  Laterality: N/A;  . Insertion of mesh N/A 12/17/2012    Procedure: INSERTION OF MESH;  Surgeon: Harl Bowie, MD;  Location: [facility];  Service: General;  Laterality: N/A;   Family History  Problem Relation Age of Onset  . Hypertension Mother   . Depression Mother   . Heart attack Father   . Hypertension Father   . Emphysema Father   . COPD Father   . Hypertension Sister    History  Substance Use Topics  . Smoking status: Never Smoker   . Smokeless tobacco: Never Used  . Alcohol Use: Yes     Comment: occ glass of wine   OB History    No data available     Review of Systems  10 systems reviewed and found to be negative, except as noted in the HPI.  Allergies  Amoxicillin; Biaxin; Erythromycin base; Penicillins; and Zithromax  Home Medications   Prior to Admission medications   Medication Sig Start Date End Date Taking? Authorizing Provider  pregabalin (LYRICA) 75 MG capsule Take 75 mg by mouth 2 (two) times daily.   Yes Historical Provider, MD  albuterol (PROVENTIL HFA;VENTOLIN HFA) 108 (90 BASE) MCG/ACT inhaler Inhale 2 puffs into the lungs every 6 (six) hours as needed for wheezing.    Historical Provider, MD  clonazePAM (KLONOPIN) 1 MG tablet Take 1 mg by mouth at bedtime as needed (for  sleep and if she feels a seizure coming on).    Historical Provider, MD  lamoTRIgine (LAMICTAL) 100 MG tablet Take 1 tablet (100 mg total) by mouth 2 (two) times daily. 06/22/14   Ward Givens, NP  nortriptyline (PAMELOR) 10 MG capsule Take 3 capsules (30 mg total) by mouth at bedtime. 08/17/14   Ward Givens, NP  ondansetron (ZOFRAN) 4 MG tablet Take 1 tablet (4 mg total) by mouth every 6 (six) hours. 09/05/14   Alexandrea Westergard, PA-C  oseltamivir (TAMIFLU) 75 MG capsule Take 1 capsule (75 mg total) by mouth every 12 (twelve)  hours. 09/05/14   Teodora Baumgarten, PA-C  oxyCODONE-acetaminophen (PERCOCET/ROXICET) 5-325 MG per tablet 1 to 2 tabs PO q6hrs  PRN for pain 09/05/14   Elmyra Ricks Dellie Piasecki, PA-C  topiramate (TOPAMAX) 100 MG tablet Take 1 tablet (100 mg total) by mouth 2 (two) times daily. 06/22/14   Ward Givens, NP   BP 137/63 mmHg  Pulse 92  Temp(Src) 98.4 F (36.9 C) (Oral)  Resp 20  Ht 5\' 2"  (1.575 m)  Wt 275 lb (124.739 kg)  BMI 50.29 kg/m2  SpO2 98% Physical Exam  Constitutional: She is oriented to person, place, and time. She appears well-developed and well-nourished. No distress.  obese  HENT:  Head: Normocephalic.  Mouth/Throat: Oropharynx is clear and moist.  Eyes: Conjunctivae and EOM are normal. Pupils are equal, round, and reactive to light.  Neck: Normal range of motion.  Cardiovascular: Normal rate, regular rhythm and intact distal pulses.   Pulmonary/Chest: Effort normal and breath sounds normal. No stridor. No respiratory distress. She has no wheezes. She has no rales. She exhibits no tenderness.  Abdominal: Soft. Bowel sounds are normal. She exhibits no distension and no mass. There is no tenderness. There is no rebound and no guarding.  Musculoskeletal: Normal range of motion.  Neurological: She is alert and oriented to person, place, and time.  Psychiatric: She has a normal mood and affect.  Nursing note and vitals reviewed.   ED Course  Procedures (including critical care time) Labs Review Labs Reviewed  CBC WITH DIFFERENTIAL - Abnormal; Notable for the following:    Neutrophils Relative % 37 (*)    Lymphocytes Relative 51 (*)    All other components within normal limits  BASIC METABOLIC PANEL - Abnormal; Notable for the following:    Glucose, Bld 100 (*)    GFR calc non Af Amer 83 (*)    All other components within normal limits  CULTURE, BLOOD (ROUTINE X 2)  CULTURE, BLOOD (ROUTINE X 2)  URINE CULTURE  URINALYSIS, ROUTINE W REFLEX MICROSCOPIC  I-STAT CG4 LACTIC ACID,  ED    Imaging Review Dg Chest 2 View  09/05/2014   CLINICAL DATA:  Productive cough and fever  EXAM: CHEST  2 VIEW  COMPARISON:  October 05, 2013  FINDINGS: Lungs are clear. Heart size and pulmonary vascularity are normal. No adenopathy. No pneumothorax. No bone lesions.  IMPRESSION: No edema or consolidation.   Electronically Signed   By: Lowella Grip M.D.   On: 09/05/2014 14:22     EKG Interpretation None      MDM   Final diagnoses:  Cough  Influenza-like illness    Filed Vitals:   09/05/14 1042 09/05/14 1247 09/05/14 1309 09/05/14 1443  BP: 144/82  137/63   Pulse: 115  92   Temp: 98.6 F (37 C)   98.4 F (36.9 C)  TempSrc: Oral   Oral  Resp: 20  20  Height: 5\' 2"  (1.575 m)     Weight: 275 lb (124.739 kg)     SpO2: 98% 98% 98%     Medications  sodium chloride 0.9 % bolus 1,000 mL (0 mLs Intravenous Stopped 09/05/14 1332)  morphine 4 MG/ML injection 4 mg (4 mg Intravenous Given 09/05/14 1243)  ondansetron (ZOFRAN) injection 4 mg (4 mg Intravenous Given 09/05/14 1243)  oseltamivir (TAMIFLU) capsule 75 mg (75 mg Oral Given 09/05/14 1243)  albuterol (PROVENTIL) (2.5 MG/3ML) 0.083% nebulizer solution 5 mg (5 mg Nebulization Given 09/05/14 1247)  HYDROmorphone (DILAUDID) injection 0.5 mg (0.5 mg Intravenous Given 09/05/14 1440)    [name] [name] is a pleasant 49 y.o. female presenting with myalgia, fever, cough, sore throat and generalized fatigue. Patient has had her flu shot this year, symptom onset less than 48 hours ago. Chest x-rays without infiltrate. Initially patient is tachycardic this is resolved after fluid bolus. Patient was given first dose of Tamiflu in the ED. Tamiflu indicated based on obesity and asthma predisposing, complications. Blood work reassuring with normal lactic acid, normal white blood cell count, no significant electrolyte abnormality. Urinalysis without abnormality. Blood urine cultures are pending.  Evaluation does not show pathology that would  require ongoing emergent intervention or inpatient treatment. Pt is hemodynamically stable and mentating appropriately. Discussed findings and plan with patient/guardian, who agrees with care plan. All questions answered. Return precautions discussed and outpatient follow up given.   New Prescriptions   ONDANSETRON (ZOFRAN) 4 MG TABLET    Take 1 tablet (4 mg total) by mouth every 6 (six) hours.   OSELTAMIVIR (TAMIFLU) 75 MG CAPSULE    Take 1 capsule (75 mg total) by mouth every 12 (twelve) hours.   OXYCODONE-ACETAMINOPHEN (PERCOCET/ROXICET) 5-325 MG PER TABLET    1 to 2 tabs PO q6hrs  PRN for pain         Monico Blitz, PA-C 09/05/14 1500  Shaune Pollack, MD 09/05/14 [phone]

## 2014-09-06 ENCOUNTER — Ambulatory Visit: Payer: BC Managed Care – PPO | Admitting: Obstetrics & Gynecology

## 2014-09-07 LAB — URINE CULTURE

## 2014-09-11 LAB — CULTURE, BLOOD (ROUTINE X 2)
Culture: NO GROWTH
Culture: NO GROWTH

## 2014-09-13 ENCOUNTER — Telehealth: Payer: Self-pay

## 2014-09-13 NOTE — Telephone Encounter (Signed)
Patient wants to know if Jinny Blossom will write a letter to her insurance company about diagnosis. Patient states Jinny Blossom and patient talked about letter in December. I stated to patient that I would send her request to to [facility]. Stated to patient that there was a $25.00 fee for letters. Patient understood process.

## 2014-09-13 NOTE — Telephone Encounter (Signed)
Hinton Dyer can you call the patient and get detail of what she needs the letter to say regarding her diagnosis. Her diagnosis has been migraine headaches. thanks

## 2014-09-14 ENCOUNTER — Encounter: Payer: Self-pay | Admitting: Adult Health

## 2014-09-14 ENCOUNTER — Telehealth: Payer: Self-pay | Admitting: *Deleted

## 2014-09-14 DIAGNOSIS — Z0289 Encounter for other administrative examinations: Secondary | ICD-10-CM

## 2014-09-14 NOTE — Telephone Encounter (Signed)
Patient has paid her $25.00 dollars for letter . Patient will pick up at the front desk. Angie took payment.

## 2014-09-14 NOTE — Telephone Encounter (Signed)
Called patient back and she states that she needs a letter stating she has post concussion syndrome. Patient states this was something that was dicussed in December. Patient states she needs letter for insurance company . I stated to patient her diagnosis was migraine . Told patient that I would get Megan's advise and call her back. Patient understood process.

## 2014-09-14 NOTE — Telephone Encounter (Signed)
Letter completed and will be up front for the patient to pick up.

## 2014-09-15 NOTE — Telephone Encounter (Signed)
Dr. Brett Fairy and Dr. Leta Baptist was going to discuss those tests. Please refer to Dr. Brett Fairy.

## 2014-09-16 NOTE — Telephone Encounter (Signed)
I called pt and relayed the information below.  Pt would like to start with [facility] , then [facility] then [facility].  She relayed she had been a pt of [facility] before.  The patient is requesting to have a DTI-Diffusion Tensor Imaging or a Spect Imaging-Single-Photon Emission Computed Tomography scan completed at the request of her attorney.

## 2014-09-16 NOTE — Telephone Encounter (Signed)
Yes and I wrote that this test is not done outside of research facilities r or trials. I offer to refer  For evaluation to a university center.  Please let her know that we don't do these tests, nor does the [facility] system. Marland Kitchen

## 2014-09-20 NOTE — Telephone Encounter (Signed)
We cannot do this test, but I  can refer to  [facility] or [facility].  Let's see who can be doing the test quicker.    We have no order for this test in EPIC. May I send paper ???

## 2014-09-21 NOTE — Telephone Encounter (Signed)
Would love to do it, but cannot find a way in EPIC!!

## 2014-09-21 NOTE — Telephone Encounter (Signed)
I called and spoke to [facility], Radiology CT about tests.  He stated that they do the Spec Imaging Single Photon Emission CT at [facility].    Order can be faxed to [phone],  Radiology [phone].  They need order and pre approval by insurance co.

## 2014-09-24 ENCOUNTER — Other Ambulatory Visit: Payer: Self-pay | Admitting: *Deleted

## 2014-09-24 DIAGNOSIS — G43909 Migraine, unspecified, not intractable, without status migrainosus: Secondary | ICD-10-CM

## 2014-09-24 DIAGNOSIS — Z9181 History of falling: Secondary | ICD-10-CM

## 2014-09-24 DIAGNOSIS — R42 Dizziness and giddiness: Secondary | ICD-10-CM

## 2014-09-24 DIAGNOSIS — G40309 Generalized idiopathic epilepsy and epileptic syndromes, not intractable, without status epilepticus: Secondary | ICD-10-CM

## 2014-09-24 NOTE — Telephone Encounter (Signed)
I placed order for the spec imaging single photon emission CT as amb referral.   Since CT I gave to Shirlean Schlein to authorize and then will fax to them at # below.   Pt aware of plan.

## 2014-09-24 NOTE — Progress Notes (Signed)
Order for test per Dr. Brett Fairy.  To fax order after insurance preauth done.

## 2014-09-26 ENCOUNTER — Inpatient Hospital Stay: Payer: Self-pay | Admitting: Internal Medicine

## 2014-09-26 LAB — CBC
HCT: 40.9 % (ref 35.0–47.0)
HGB: 13.4 g/dL (ref 12.0–16.0)
MCH: 27.3 pg (ref 26.0–34.0)
MCHC: 32.9 g/dL (ref 32.0–36.0)
MCV: 83 fL (ref 80–100)
Platelet: 195 10*3/uL (ref 150–440)
RBC: 4.93 10*6/uL (ref 3.80–5.20)
RDW: 15.1 % — ABNORMAL HIGH (ref 11.5–14.5)
WBC: 8.5 10*3/uL (ref 3.6–11.0)

## 2014-09-26 LAB — BASIC METABOLIC PANEL
ANION GAP: 8 (ref 7–16)
BUN: 13 mg/dL (ref 7–18)
Calcium, Total: 9.1 mg/dL (ref 8.5–10.1)
Chloride: 112 mmol/L — ABNORMAL HIGH (ref 98–107)
Co2: 25 mmol/L (ref 21–32)
Creatinine: 0.84 mg/dL (ref 0.60–1.30)
EGFR (African American): >60
GLUCOSE: 104 mg/dL — AB (ref 65–99)
OSMOLALITY: 289 (ref 275–301)
POTASSIUM: 3.3 mmol/L — AB (ref 3.5–5.1)
Sodium: 145 mmol/L (ref 136–145)

## 2014-09-26 LAB — URINALYSIS, COMPLETE
Bilirubin,UR: NEGATIVE
Blood: NEGATIVE
GLUCOSE, UR: NEGATIVE mg/dL (ref 0–75)
Ketone: NEGATIVE
Leukocyte Esterase: NEGATIVE
Nitrite: NEGATIVE
PROTEIN: NEGATIVE
Ph: 8 (ref 4.5–8.0)
SPECIFIC GRAVITY: 1.006 (ref 1.003–1.030)
Squamous Epithelial: 1

## 2014-09-26 LAB — MAGNESIUM: Magnesium: 1.8 mg/dL

## 2014-09-26 LAB — PRO B NATRIURETIC PEPTIDE: B-Type Natriuretic Peptide: 120 pg/mL (ref 0–125)

## 2014-09-26 LAB — PROTIME-INR
INR: 1
Prothrombin Time: 13.4 secs

## 2014-09-26 LAB — HEPATIC FUNCTION PANEL A (ARMC)
ALK PHOS: 116 U/L (ref 46–116)
Albumin: 3.5 g/dL (ref 3.4–5.0)
BILIRUBIN TOTAL: 0.2 mg/dL (ref 0.2–1.0)
Bilirubin, Direct: <0.1 mg/dL (ref 0.0–0.2)
SGOT(AST): 17 U/L (ref 15–37)
SGPT (ALT): 24 U/L (ref 14–63)
TOTAL PROTEIN: 7.7 g/dL (ref 6.4–8.2)

## 2014-09-26 LAB — TROPONIN I: Troponin-I: <0.02 ng/mL

## 2014-09-27 LAB — TROPONIN I: Troponin-I: 0.02 ng/mL

## 2014-09-27 LAB — LIPID PANEL
CHOLESTEROL: 141 mg/dL (ref 0–200)
HDL Cholesterol: 44 mg/dL (ref 40–60)
Ldl Cholesterol, Calc: 84 mg/dL (ref 0–100)
TRIGLYCERIDES: 64 mg/dL (ref 0–200)
VLDL Cholesterol, Calc: 13 mg/dL (ref 5–40)

## 2014-09-27 LAB — HEMOGLOBIN A1C: HEMOGLOBIN A1C: 5.9 % (ref 4.2–6.3)

## 2014-09-28 LAB — POTASSIUM: Potassium: 3.8 mmol/L (ref 3.5–5.1)

## 2014-10-01 ENCOUNTER — Emergency Department (HOSPITAL_COMMUNITY): Payer: BC Managed Care – PPO

## 2014-10-01 ENCOUNTER — Encounter (HOSPITAL_COMMUNITY): Payer: Self-pay | Admitting: Emergency Medicine

## 2014-10-01 ENCOUNTER — Emergency Department (HOSPITAL_COMMUNITY)
Admission: EM | Admit: 2014-10-01 | Discharge: 2014-10-01 | Disposition: A | Discharge status: Home / Self Care | Payer: BC Managed Care – PPO | Attending: Emergency Medicine | Admitting: Emergency Medicine

## 2014-10-01 DIAGNOSIS — M79609 Pain in unspecified limb: Secondary | ICD-10-CM

## 2014-10-01 DIAGNOSIS — J45901 Unspecified asthma with (acute) exacerbation: Secondary | ICD-10-CM | POA: Insufficient documentation

## 2014-10-01 DIAGNOSIS — R091 Pleurisy: Secondary | ICD-10-CM | POA: Diagnosis not present

## 2014-10-01 DIAGNOSIS — Z880 Allergy status to penicillin: Secondary | ICD-10-CM | POA: Diagnosis not present

## 2014-10-01 DIAGNOSIS — R079 Chest pain, unspecified: Secondary | ICD-10-CM | POA: Diagnosis not present

## 2014-10-01 DIAGNOSIS — G40909 Epilepsy, unspecified, not intractable, without status epilepticus: Secondary | ICD-10-CM | POA: Diagnosis not present

## 2014-10-01 DIAGNOSIS — G43909 Migraine, unspecified, not intractable, without status migrainosus: Secondary | ICD-10-CM | POA: Insufficient documentation

## 2014-10-01 DIAGNOSIS — Z79899 Other long term (current) drug therapy: Secondary | ICD-10-CM | POA: Diagnosis not present

## 2014-10-01 LAB — CBC
HEMATOCRIT: 40.9 % (ref 36.0–46.0)
HEMOGLOBIN: 13.5 g/dL (ref 12.0–15.0)
MCH: 27.7 pg (ref 26.0–34.0)
MCHC: 33 g/dL (ref 30.0–36.0)
MCV: 83.8 fL (ref 78.0–100.0)
PLATELETS: 211 10*3/uL (ref 150–400)
RBC: 4.88 MIL/uL (ref 3.87–5.11)
RDW: 14.3 % (ref 11.5–15.5)
WBC: 7.8 10*3/uL (ref 4.0–10.5)

## 2014-10-01 LAB — BASIC METABOLIC PANEL
Anion gap: 5 (ref 5–15)
BUN: 14 mg/dL (ref 6–23)
CALCIUM: 8.8 mg/dL (ref 8.4–10.5)
CO2: 21 mmol/L (ref 19–32)
CREATININE: 0.81 mg/dL (ref 0.50–1.10)
Chloride: 114 mmol/L — ABNORMAL HIGH (ref 96–112)
GFR calc Af Amer: >90 mL/min (ref >90)
GFR calc non Af Amer: 85 mL/min — ABNORMAL LOW (ref >90)
Glucose, Bld: 117 mg/dL — ABNORMAL HIGH (ref 70–99)
Potassium: 4.1 mmol/L (ref 3.5–5.1)
SODIUM: 140 mmol/L (ref 135–145)

## 2014-10-01 LAB — BRAIN NATRIURETIC PEPTIDE: B Natriuretic Peptide: 30.9 pg/mL (ref 0.0–100.0)

## 2014-10-01 LAB — I-STAT TROPONIN, ED: Troponin i, poc: 0 ng/mL (ref 0.00–0.08)

## 2014-10-01 MED ORDER — KETOROLAC TROMETHAMINE 30 MG/ML IJ SOLN
30.0000 mg | Freq: Once | INTRAMUSCULAR | Status: AC
  Administered 2014-10-01: 30 mg via INTRAVENOUS
  Filled 2014-10-01: qty 1

## 2014-10-01 MED ORDER — IOHEXOL 350 MG/ML SOLN
100.0000 mL | Freq: Once | INTRAVENOUS | Status: AC | PRN
  Administered 2014-10-01: 100 mL via INTRAVENOUS

## 2014-10-01 NOTE — Discharge Instructions (Signed)
Take Naproxen or ibuprofen as needed for pain. Refer to attached documents for more information. Return to the ED with worsening or concerning symptoms.

## 2014-10-01 NOTE — ED Notes (Signed)
Pt c/o chest pain that radiates to right shoulder and right arm that started last.  Pt states that she does have SOB, dizziness, lightheadedness, nausea. Pt was admitted for a fib, but not taking blood thinners.

## 2014-10-01 NOTE — ED Provider Notes (Signed)
CSN: [id number]     Arrival date & time 10/01/14  1423 History   First MD Initiated Contact with Patient 10/01/14 1521     Chief Complaint  Patient presents with  . Chest Pain     (Consider location/radiation/quality/duration/timing/severity/associated sxs/prior Treatment) HPI Comments: Patient is a 48 year old female with a past medical history of afib, asthma, seizures, GERD, and obesity who presents with chest pain that started in the middle of the night. Patient reports waking up with a central aching chest pain that has been continuous since the onset. The pain radiates to her right shoulder and right arm. Deep inspiration makes the pain worse. No alleviating factors. She reports associated SOB. Patient reports being diagnosed with afib 4 days ago when she had afib with RVR. She spent the night in the hospital in [place] where her rhythm returned to normal sinus. No alleviating factors. No other associated symptoms.    Past Medical History  Diagnosis Date  . Migraine   . Seizures   . Asthma   . GERD (gastroesophageal reflux disease)   . Obesity, Class III, BMI 40-49.9 (morbid obesity) 12/21/2012   Past Surgical History  Procedure Laterality Date  . Abdominal hysterectomy    . Cesarean section    . Esophagogastroduodenoscopy N/A 11/21/2012    Procedure: ESOPHAGOGASTRODUODENOSCOPY (EGD);  Surgeon: Irene Shipper, MD;  Location: [facility];  Service: Endoscopy;  Laterality: N/A;  . Colonoscopy N/A 11/21/2012    Procedure: COLONOSCOPY;  Surgeon: Irene Shipper, MD;  Location: [facility];  Service: Endoscopy;  Laterality: N/A;  . Dilation and curettage of uterus    . Hernia repair  12/17/12    laparoscopic incisional   . Incisional hernia repair N/A 12/17/2012    Procedure: LAPAROSCOPIC INCISIONAL HERNIA;  Surgeon: Harl Bowie, MD;  Location: [facility];  Service: General;  Laterality: N/A;  . Insertion of mesh N/A 12/17/2012    Procedure: INSERTION OF MESH;  Surgeon: Harl Bowie, MD;  Location: [facility];  Service: General;  Laterality: N/A;   Family History  Problem Relation Age of Onset  . Hypertension Mother   . Depression Mother   . Heart attack Father   . Hypertension Father   . Emphysema Father   . COPD Father   . Hypertension Sister    History  Substance Use Topics  . Smoking status: Never Smoker   . Smokeless tobacco: Never Used  . Alcohol Use: Yes     Comment: occ glass of wine   OB History    No data available     Review of Systems  Constitutional: Negative for fever, chills and fatigue.  HENT: Negative for trouble swallowing.   Eyes: Negative for visual disturbance.  Respiratory: Positive for shortness of breath.   Cardiovascular: Positive for chest pain. Negative for palpitations.  Gastrointestinal: Negative for nausea, vomiting, abdominal pain and diarrhea.  Genitourinary: Negative for dysuria and difficulty urinating.  Musculoskeletal: Negative for arthralgias and neck pain.  Skin: Negative for color change.  Neurological: Negative for dizziness and weakness.  Psychiatric/Behavioral: Negative for dysphoric mood.      Allergies  Amoxicillin; Biaxin; Erythromycin base; Penicillins; and Zithromax  Home Medications   Prior to Admission medications   Medication Sig Start Date End Date Taking? Authorizing Provider  albuterol (PROVENTIL HFA;VENTOLIN HFA) 108 (90 BASE) MCG/ACT inhaler Inhale 2 puffs into the lungs every 6 (six) hours as needed for wheezing.   Yes Historical Provider, MD  aspirin 81 MG  EC tablet Take 1 tablet by mouth daily. 09/28/14  Yes Historical Provider, MD  clonazePAM (KLONOPIN) 1 MG tablet Take 1 mg by mouth at bedtime as needed (for sleep and if she feels a seizure coming on).   Yes Historical Provider, MD  estradiol (ESTRACE) 1 MG tablet Take 1 tablet by mouth daily. 09/01/14  Yes Historical Provider, MD  FLECTOR 1.3 % PTCH Take 1 tablet by mouth every 12 (twelve) hours. 09/10/14  Yes Historical Provider, MD   lamoTRIgine (LAMICTAL) 100 MG tablet Take 1 tablet (100 mg total) by mouth 2 (two) times daily. 06/22/14  Yes Ward Givens, NP  metoprolol tartrate (LOPRESSOR) 25 MG tablet Take 1 tablet by mouth 2 (two) times daily. 09/28/14  Yes Historical Provider, MD  nortriptyline (PAMELOR) 10 MG capsule Take 3 capsules (30 mg total) by mouth at bedtime. Patient taking differently: Take 20 mg by mouth 2 (two) times daily.  08/17/14  Yes Ward Givens, NP  pregabalin (LYRICA) 75 MG capsule Take 75 mg by mouth 2 (two) times daily.   Yes Historical Provider, MD  topiramate (TOPAMAX) 100 MG tablet Take 1 tablet (100 mg total) by mouth 2 (two) times daily. 06/22/14  Yes Ward Givens, NP  Vitamin D, Ergocalciferol, (DRISDOL) 50000 UNITS CAPS capsule Take 1 capsule by mouth once a week. Every tuesday 09/07/14  Yes Historical Provider, MD  ondansetron (ZOFRAN) 4 MG tablet Take 1 tablet (4 mg total) by mouth every 6 (six) hours. Patient not taking: Reported on 10/01/2014 09/05/14   Elmyra Ricks Pisciotta, PA-C  oseltamivir (TAMIFLU) 75 MG capsule Take 1 capsule (75 mg total) by mouth every 12 (twelve) hours. Patient not taking: Reported on 10/01/2014 09/05/14   Elmyra Ricks Pisciotta, PA-C  oxyCODONE-acetaminophen (PERCOCET/ROXICET) 5-325 MG per tablet 1 to 2 tabs PO q6hrs  PRN for pain Patient not taking: Reported on 10/01/2014 09/05/14   Elmyra Ricks Pisciotta, PA-C   BP 135/77 mmHg  Pulse 78  Temp(Src) 98.2 F (36.8 C) (Oral)  Resp 16  SpO2 98% Physical Exam  Constitutional: She is oriented to person, place, and time. She appears well-developed and well-nourished. No distress.  HENT:  Head: Normocephalic and atraumatic.  Eyes: Conjunctivae and EOM are normal.  Neck: Normal range of motion.  Cardiovascular: Normal rate, regular rhythm and intact distal pulses.  Exam reveals no gallop and no friction rub.   No murmur heard. Pulmonary/Chest: Effort normal and breath sounds normal. She has no wheezes. She has no rales. She exhibits  no tenderness.  Abdominal: Soft. She exhibits no distension. There is no tenderness. There is no rebound.  Musculoskeletal: Normal range of motion.  Left calf tenderness to palpation. No right calf tenderness to palpation. No leg swelling noted.   Neurological: She is alert and oriented to person, place, and time. Coordination normal.  Speech is goal-oriented. Moves limbs without ataxia.   Skin: Skin is warm and dry.  Psychiatric: She has a normal mood and affect. Her behavior is normal.  Nursing note and vitals reviewed.   ED Course  Procedures (including critical care time) Labs Review Labs Reviewed  BASIC METABOLIC PANEL - Abnormal; Notable for the following:    Chloride 114 (*)    Glucose, Bld 117 (*)    GFR calc non Af Amer 85 (*)    All other components within normal limits  CBC  BRAIN NATRIURETIC PEPTIDE  I-STAT TROPOININ, ED    Imaging Review Dg Chest 2 View  10/01/2014   CLINICAL DATA:  Right and left  chest pain, dizziness, shortness of Breath for 1 day  EXAM: CHEST  2 VIEW  COMPARISON:  09/05/2014  FINDINGS: Cardiomediastinal silhouette is stable. No acute infiltrate or pleural effusion. No pulmonary edema. Bony thorax is unremarkable.  IMPRESSION: No active cardiopulmonary disease.   Electronically Signed   By: Lahoma Crocker M.D.   On: 10/01/2014 16:40   Ct Angio Chest Pe W/cm &/or Wo Cm  10/01/2014   CLINICAL DATA:  Chest pain that radiates right shoulder and right arm. Shortness of breath. Dizziness.  EXAM: CT ANGIOGRAPHY CHEST WITH CONTRAST  TECHNIQUE: Multidetector CT imaging of the chest was performed using the standard protocol during bolus administration of intravenous contrast. Multiplanar CT image reconstructions and MIPs were obtained to evaluate the vascular anatomy.  CONTRAST:  112mL OMNIPAQUE IOHEXOL 350 MG/ML SOLN  COMPARISON:  Chest radiograph of 09/05/2014.  Chest CT 05/22/2007  FINDINGS: Mediastinum/Nodes: The quality of this exam for evaluation of pulmonary  embolism is poor. In addition to motion and patient size degradation, the bolus is poorly timed. Contrast is centered in the SVC. No central pulmonary embolism. No lobar embolism to the lower lobes. Smaller emboli cannot be excluded.  Grossly normal aortic caliber, without dissection. Abberant right subclavian artery arises from the transverse segment and extends posterior to the esophagus, including on image 15 of series 4. Heart size upper normal, without pericardial effusion. No mediastinal or hilar adenopathy.  Lungs/Pleura: Mild degradation secondary to motion and patient body habitus. No lobar consolidation. No pleural fluid.  Upper abdomen: Normal imaged portions of the liver, spleen, left adrenal gland, and left kidney.  Musculoskeletal: No acute osseous abnormality.  Review of the MIP images confirms the above findings.  IMPRESSION: 1. Suboptimal evaluation for pulmonary embolism secondary to factors detailed above. No large embolism identified. 2. No other explanation for chest pain or shortness of breath.   Electronically Signed   By: Abigail Miyamoto M.D.   On: 10/01/2014 16:38    Date: 10/01/2014  Rate: 83  Rhythm: normal sinus rhythm  QRS Axis: left  Intervals: normal  ST/T Wave abnormalities: normal  Conduction Disutrbances:none  Narrative Interpretation: NSR without acute changes  Old EKG Reviewed: unchanged     EKG Interpretation None      MDM   Final diagnoses:  Chest pain  Pleurisy    3:57 PM Labs and troponin unremarkable for acute changes. Chest xray pending. Patient will have CT angio to rule out PE given recent diagnosis of afib and worsening DOE.   7:14 PM CT angio and DVT study unremarkable for acute changes. Patient reports having a virus about 3-4 weeks ago. Patient likely has pleurisy give her history. Patient's vitals stable here in the ED and patient maintained NSR during her stay. Patient instructed to return with worsening or concerning symptoms.     Alvina Chou, PA-C 10/01/14 [place], MD 10/02/14 1235

## 2014-10-01 NOTE — Progress Notes (Signed)
VASCULAR LAB PRELIMINARY  PRELIMINARY  PRELIMINARY  PRELIMINARY  Left lower extremity venous duplex completed.    Preliminary report:  Left:  No evidence of DVT, superficial thrombosis, or Baker's cyst.  Tyrick Dunagan, RVT 10/01/2014, 6:43 PM

## 2014-10-06 ENCOUNTER — Telehealth: Payer: Self-pay

## 2014-10-12 ENCOUNTER — Ambulatory Visit: Payer: BC Managed Care – PPO | Admitting: Adult Health

## 2014-10-13 ENCOUNTER — Telehealth: Payer: Self-pay | Admitting: Adult Health

## 2014-10-13 NOTE — Telephone Encounter (Signed)
Referral addended to add HEAD.  Will give to Shirlean Schlein to fax.

## 2014-10-13 NOTE — Telephone Encounter (Signed)
[name] [name] with [facility] Radiology Department @ [phone], option 4, received order for PET/CT.  Stated need body part indicated on referral form that she's faxing to main fax number today.  Please call with any questions.

## 2014-10-19 ENCOUNTER — Telehealth: Payer: Self-pay

## 2014-10-19 NOTE — Telephone Encounter (Signed)
Called and spoke to patient and relayed per Dr.Dohmeier advise, Normal Pet CT and  Normal CT Head with out contrast. Patient understood results and will keep her follow apt. With Dr.Dohmier In April.

## 2014-11-02 ENCOUNTER — Encounter: Payer: Self-pay | Admitting: Neurology

## 2014-12-01 ENCOUNTER — Emergency Department
Admit: 2014-12-01 | Disposition: A | Discharge status: Home / Self Care | Payer: Self-pay | Admitting: Emergency Medicine

## 2014-12-01 LAB — CBC
HCT: 39.8 % (ref 35.0–47.0)
HGB: 13 g/dL (ref 12.0–16.0)
MCH: 27.1 pg (ref 26.0–34.0)
MCHC: 32.6 g/dL (ref 32.0–36.0)
MCV: 83 fL (ref 80–100)
PLATELETS: 192 10*3/uL (ref 150–440)
RBC: 4.77 10*6/uL (ref 3.80–5.20)
RDW: 15 % — ABNORMAL HIGH (ref 11.5–14.5)
WBC: 7.3 10*3/uL (ref 3.6–11.0)

## 2014-12-01 LAB — BASIC METABOLIC PANEL
ANION GAP: 6 — AB (ref 7–16)
BUN: 17 mg/dL
CALCIUM: 8.7 mg/dL — AB
CREATININE: 0.83 mg/dL
Chloride: 111 mmol/L
Co2: 23 mmol/L
EGFR (African American): >60
EGFR (Non-African Amer.): >60
Glucose: 99 mg/dL
POTASSIUM: 3.8 mmol/L
SODIUM: 140 mmol/L

## 2014-12-01 LAB — TROPONIN I: Troponin-I: <0.03 ng/mL

## 2014-12-22 ENCOUNTER — Ambulatory Visit: Payer: BC Managed Care – PPO | Admitting: Neurology

## 2014-12-26 NOTE — Discharge Summary (Signed)
PATIENT NAME:  [name] [name], [name] [name] MR#:  [id number] DATE OF BIRTH:  26-Sep-1965  DATE OF ADMISSION:  09/26/2014 DATE OF DISCHARGE:  09/28/2014  For a detailed note, please take a look at the history and physical done on admission by Dr. Bridgett Larsson.   DIAGNOSES AT DISCHARGE: As follows: Atrial fibrillation, now converted to a sinus rhythm; history of migraines; history of seizures; chronic pain.   DIET: The patient is being discharged on a regular diet.   ACTIVITY: As tolerated.   FOLLOWUP: With Dr. Clayborn Bigness in the next 1-2 weeks. Also, follow up with Dr. Jarome Lamas in the next 1-2 weeks.   DISCHARGE MEDICATIONS: Klonopin 0.5 mg at bedtime, Lamictal 100 mg b.i.d., Topamax 100 mg b.i.d., nortriptyline 20 mg b.i.d., Lyrica 75 mg daily, albuterol inhaler 2 puffs 4 times daily as needed, metoprolol tartrate 25 mg b.i.d., and aspirin 81 mg daily.   [place] COURSE: Dr. Lujean Amel from cardiology.   PERTINENT STUDIES DONE DURING THE HOSPITAL COURSE: As follows: A chest x-ray done on admission showing mild cardiac enlargement, no edema or consolidation. A 2-dimensional echocardiogram done showing ejection fraction to be 70% to 75%, normal global LV systolic function, mildly increased left ventricular posterior wall thickness.   BRIEF HOSPITAL COURSE: This is a 49 year old female who presented to the hospital with chest pain and palpitations and noted to be in new onset atrial fibrillation.   1.  Atrial fibrillation with rapid ventricular response. This was new onset for the patient. The patient was given boluses of Cardizem in the ER, eventually put on a Cardizem drip and admitted to the CCU stepdown. The patient eventually, after being on a Cardizem drip for a while, converted to a normal sinus rhythm and has been maintained on it. She was started on oral metoprolol. Her echocardiogram showed normal ejection fraction. Her thyroid studies are normal. The patient was seen by  cardiology, who recommended just continuing the oral beta blocker and aspirin for anticoagulation and follow up with them as an outpatient.  2.  History of seizures. The patient was maintained on Topamax and Lamictal; she will resume that.  3.  History of migraines. The patient will continue her Topamax. The migraines are recurrent. I did end up giving her a few doses of IV narcotics, some Reglan and magnesium while in the hospital. I did recommend to her that she should follow up with her neurologist or go for neurology due to recurrence of her migraines.   CODE STATUS: The patient is a full code.   DISPOSITION: She was discharged home.   TIME SPENT: Thirty-five minutes.    ____________________________ Belia Heman. Verdell Carmine, MD vjs:TT D: 09/28/2014 16:10:01 ET T: 09/28/2014 17:28:38 ET JOB#: [id number]  cc: Belia Heman. Verdell Carmine, MD, <Dictator> Irven Easterly. Kary Kos, MD Dwayne D. Clayborn Bigness, MD Henreitta Leber MD ELECTRONICALLY SIGNED 10/09/2014 12:51

## 2014-12-26 NOTE — Consult Note (Signed)
PATIENT NAME:  [name] [name], [name] [name] MR#:  [id number] DATE OF BIRTH:  1965-11-03  DATE OF CONSULTATION:  09/27/2014  REFERRING PHYSICIAN:  Demetrios Loll, MD CONSULTING PHYSICIAN:  Dwayne D. Clayborn Bigness, MD  CARDIOLOGY CONSULTATION    PRIMARY CARE PHYSICIAN: Irven Easterly. Kary Kos, MD  INDICATION: Chest pain and palpitations.   HISTORY OF PRESENT ILLNESS: [name] [name] is a 49 year old obese black female with migraines, seizures,  who presented to the Emergency Room with chest pain or palpitations. The patient was alert, awake, oriented, and in no acute distress. The patient stated that she started having midsternal chest pain while driving with radiation to her shoulder. The patient's pain was 6 out of 10; she also complained of palpitations, dizziness, and mild shortness of breath. The patient denies any further chills; no PND or orthopnea. The patient was noticed to have atrial fibrillation on presentation about 140, and was treated with Cardizem for rate control. She was admitted for further evaluation and care. Chest palpitations are better. Heart rate is better, but she still has some chest soreness.   PAST MEDICAL HISTORY: Seizure disorder, migraines, obesity.   PAST SURGICAL HISTORY: Hysterectomy.   SOCIAL HISTORY: No smoking or alcohol consumption. She still works.   FAMILY HISTORY: Myocardial infarction, hypertension, diabetes.   REVIEW OF SYSTEMS: No blackout spells or syncope. No nausea or vomiting. No fever. No chills. No sweats. No weight loss or weight gain. No hemoptysis or hematemesis. No bright red blood per rectum. She has had palpitations and tachycardia and chest pain. The patient complained of chest pain, shortness of breath and palpitations.   PHYSICAL EXAMINATION:  VITAL SIGNS: Blood pressure 110/80, pulse of 140 and irregular, and now at 90 and regular, respiratory rate 16, afebrile.  HEENT: Normocephalic, atraumatic. Pupils equal and reactive to light.  NECK: Supple. No significant JVD,  bruits or adenopathy.  LUNGS: Clear to auscultation and percussion. No significant wheeze, rhonchi, or rale.  HEART: Regular rate and rhythm. Systolic ejection murmur left sternal border. Positive S4. PMI nondisplaced.  ABDOMEN: Benign.  EXTREMITIES: Within normal limits.  NEUROLOGIC: Intact.  SKIN: Normal.   LABORATORIES: Urinalysis negative. Troponin 0.02. CBC normal. Glucose 104, BUN 13, creatinine 0.84; electrolytes were normal, except potassium was 3.3 and chloride was 112. BNP 120. INR 1.0. Magnesium 1.8.   CHEST X-RAY: Mild cardiomegaly; otherwise negative.   ELECTROCARDIOGRAM: Atrial fibrillation with rapid ventricular response at 140.   IMPRESSION: Rapid atrial fibrillation, angina, hypokalemia, seizure disorder, migraines, obesity, possible angina.   PLAN: Agree with admit. Rule out for myocardial infarction. Follow up cardiac enzymes. Follow-up EKG. Echocardiogram will be useful. Consider functional study or cardiac catheterization as an inpatient or outpatient, because of possible anginal symptoms. Cardizem will be helpful for rate control and possible angina; will consider nitrates or beta blocker instead of Cardizem. Continue aspirin therapy. Recommend lipid evaluation. Will consider whether further evaluation is necessary. Do not recommend long-term anticoagulation at this point, unless other information becomes clear, because her CHADS score at this point is relatively low. Will continue medical therapy and consider further evaluation as an outpatient, possibly with a functional study or cardiac catheterization. If the patient continues to have pain, we will consider work-up as an inpatient.   ALLERGIES: AMPICILLIN, ZITHROMAX, BIAXIN, ERYTHROMYCIN, AND PENICILLIN.   HOME MEDICATIONS: Ventolin high-dose nebulizer 2 puffs q. 4 hours. p.r.n., Topiramate 100 mg twice a day, nortriptyline 10 mg 2 tablets b.i.d., Lyrica 75 mg daily, lamotrigine 100 mg twice a day, clonazepam 0.5 mg once  a day as needed.     ____________________________ Loran Senters Clayborn Bigness, MD ddc:MT D: 09/27/2014 16:23:00 ET T: 09/27/2014 17:07:04 ET JOB#: [id number]  cc: Dwayne D. Clayborn Bigness, MD, <Dictator> Yolonda Kida MD ELECTRONICALLY SIGNED 10/05/2014 17:25

## 2014-12-26 NOTE — H&P (Signed)
PATIENT NAME:  [name] [name], BUNDRICK MR#:  [id number] DATE OF BIRTH:  1966/05/25  DATE OF ADMISSION:  09/26/2014  PRIMARY CARE PHYSICIAN:  Irven Easterly. Kary Kos, MD  CHIEF COMPLAINT:  Chest pain and palpitation today.   HISTORY OF PRESENT ILLNESS:  A 49 year old female with a history of migraine and seizure disorder, who presented to the ED with chest pain and palpitation. The patient is alert, awake, oriented, and in no acute distress. The patient said that she started to have chest pain, which was in the substernal area, intermittent and 5 to 6 out of 10. The patient also complains of palpitation, dizziness, and mild shortness of breath. The patient denies any fever or chills. No orthopnea, nocturnal dyspnea, or leg edema. The patient was noticed to have atrial fibrillation with RVR in the 140s. She was treated with Cardizem IV twice. Heart rate decreased to the 80s.   PAST MEDICAL HISTORY:  Seizure disorder, migraine.   PAST SURGICAL HISTORY:  Hysterectomy.   SOCIAL HISTORY:  Denies any smoking, alcohol drinking, or illicit drugs.   FAMILY HISTORY:  Father had a heart attack, and hypertension and diabetes run in her family.   REVIEW OF SYSTEMS: CONSTITUTIONAL:  The patient denies any fever or chills. No headache but has dizziness and weakness.  EYES:  No double vision. No blurry vision.  EARS, NOSE, AND THROAT:  No postnasal drip, slurred speech, or dysphagia.  CARDIOVASCULAR:  Positive for chest pain and palpitation. No orthopnea or nocturnal dyspnea. No leg edema.  PULMONARY:  Mild shortness of breath. No cough, sputum, or hemoptysis.  GASTROINTESTINAL:  No abdominal pain, nausea, vomiting, or diarrhea. No melena or bloody stool.  GENITOURINARY:  No dysuria, hematuria, or incontinence.  SKIN:  No rash or jaundice.  NEUROLOGIC:  No syncope, loss of consciousness, or seizure.  HEMATOLOGIC:  No easy bruising or bleeding.  ENDOCRINE:  No polyuria, polydipsia, or heat or cold intolerance.    PHYSICAL EXAMINATION: VITAL SIGNS:  Temperature 98.6, blood pressure 107/79, pulse 140, and O2 saturation 99% on room air.  GENERAL:  The patient is alert, awake, oriented, and in no acute distress.  HEENT:  Pupils are round, equal, and reactive to light and accommodation. Moist oral mucosa. Clear oropharynx.  NECK:  Supple. No JVD or carotid bruit. No lymphadenopathy. No thyromegaly.  CARDIOVASCULAR:  S1 and S2. Irregular rate and rhythm and tachycardia. No murmurs or gallops.  PULMONARY:  Bilateral air entry. No wheezing or rales. No use of accessory muscles to breathe.  ABDOMEN:  Soft. No distention or tenderness. No organomegaly. Bowel sounds present. Obese.  EXTREMITIES:  No edema, clubbing, or cyanosis. No calf tenderness. Bilateral pedal pulses present.  SKIN:  No rash or jaundice.  NEUROLOGIC:  A/O x 3. No focal deficit. Power is 5/5. Sensation is intact.   LABORATORY DATA:  Urinalysis is negative. Troponin is less than 0.02. CBC is in the normal range. Glucose is 104, BUN 13, creatinine 0.84. Electrolytes are normal except potassium of 3.3, chloride 112. BNP is 120. INR is 1.0. Magnesium is 1.8.   CHEST X-RAY:  Mild cardiac enlargement. No edema or consolidation.   EKG:  Shows atrial fibrillation with RVR at 145 BPM.   IMPRESSIONS: 1.  New-onset atrial fibrillation with rapid ventricular response.  2.  Hypokalemia.  3.  Seizure disorder. 4.  Migraine. 5. Morbid obesity.  PLAN OF TREATMENT: The patient will be admitted to a stepdown unit. The patient was already given Cardizem IV push twice. Heart  rate decreased to the 80s but increased to the 140s again. I gave a third dose of Cardizem 10 mg IV, but the patient's heart rate is still in the 140s. The patient will be started on Cardizem drip and transferred to a stepdown unit.   In addition, I will get an echocardiograph and cardiology consult and start Lovenox 1 mg per kg q. 12 hours and lopressor.   For hypokalemia, give  potassium, follow up potassium and magnesium level.  I will start aspirin and check a lipid panel and hemoglobin A1c.   Continue the patient's seizure medication.   ALLERGIES:  AMPICILLIN, ZITHROMAX, BIAXIN, ERYTHROMYCIN, AND PENICILLIN.   HOME MEDICATIONS:   1.  Ventolin HFA CFC-free 90 mcg 2 puffs 4 times a day p.r.n. 2.  Topiramate 100 mg p.o. b.i.d.  3.  Nortriptyline 10 mg p.o. 2 caps b.i.d.  4.  Lyrica 75 mg p.o. daily.  5.  Lamotrigine 100 mg p.o. b.i.d.   6.  Clonazepam 0.5 mg p.o. once a day at bedtime p.r.n.   I discussed the patient's condition and plan of treatment with the patient and the patient's sister.    TIME SPENT:  About 63 minutes.    ____________________________ Demetrios Loll, MD qc:nb D: 09/26/2014 21:37:55 ET T: 09/26/2014 21:58:35 ET JOB#: [id number]  cc: Demetrios Loll, MD, <Dictator> Demetrios Loll MD ELECTRONICALLY SIGNED 09/27/2014 15:35

## 2015-01-19 ENCOUNTER — Ambulatory Visit: Payer: BC Managed Care – PPO | Admitting: Anesthesiology

## 2015-01-21 ENCOUNTER — Ambulatory Visit
Admission: RE | Admit: 2015-01-21 | Discharge: 2015-01-21 | Disposition: A | Discharge status: Home / Self Care | Payer: BC Managed Care – PPO | Source: Ambulatory Visit | Attending: Internal Medicine | Admitting: Internal Medicine

## 2015-01-21 ENCOUNTER — Encounter: Payer: Self-pay | Admitting: *Deleted

## 2015-01-21 ENCOUNTER — Encounter
Admission: RE | Disposition: A | Discharge status: Home / Self Care | Payer: Self-pay | Source: Ambulatory Visit | Attending: Internal Medicine

## 2015-01-21 DIAGNOSIS — Z7982 Long term (current) use of aspirin: Secondary | ICD-10-CM | POA: Insufficient documentation

## 2015-01-21 DIAGNOSIS — E669 Obesity, unspecified: Secondary | ICD-10-CM | POA: Insufficient documentation

## 2015-01-21 DIAGNOSIS — Z8249 Family history of ischemic heart disease and other diseases of the circulatory system: Secondary | ICD-10-CM | POA: Insufficient documentation

## 2015-01-21 DIAGNOSIS — K219 Gastro-esophageal reflux disease without esophagitis: Secondary | ICD-10-CM | POA: Insufficient documentation

## 2015-01-21 DIAGNOSIS — J45909 Unspecified asthma, uncomplicated: Secondary | ICD-10-CM | POA: Insufficient documentation

## 2015-01-21 DIAGNOSIS — G43909 Migraine, unspecified, not intractable, without status migrainosus: Secondary | ICD-10-CM | POA: Diagnosis not present

## 2015-01-21 DIAGNOSIS — F411 Generalized anxiety disorder: Secondary | ICD-10-CM | POA: Insufficient documentation

## 2015-01-21 DIAGNOSIS — R0602 Shortness of breath: Secondary | ICD-10-CM | POA: Insufficient documentation

## 2015-01-21 DIAGNOSIS — R079 Chest pain, unspecified: Secondary | ICD-10-CM | POA: Diagnosis not present

## 2015-01-21 DIAGNOSIS — I480 Paroxysmal atrial fibrillation: Secondary | ICD-10-CM | POA: Diagnosis not present

## 2015-01-21 DIAGNOSIS — Z79899 Other long term (current) drug therapy: Secondary | ICD-10-CM | POA: Insufficient documentation

## 2015-01-21 DIAGNOSIS — G40909 Epilepsy, unspecified, not intractable, without status epilepticus: Secondary | ICD-10-CM | POA: Insufficient documentation

## 2015-01-21 HISTORY — DX: Reserved for inherently not codable concepts without codable children: IMO0001

## 2015-01-21 HISTORY — DX: Angina pectoris, unspecified: I20.9

## 2015-01-21 HISTORY — PX: CARDIAC CATHETERIZATION: SHX172

## 2015-01-21 HISTORY — DX: Anxiety disorder, unspecified: F41.9

## 2015-01-21 SURGERY — LEFT HEART CATH
Anesthesia: Moderate Sedation

## 2015-01-21 MED ORDER — FENTANYL CITRATE (PF) 100 MCG/2ML IJ SOLN
INTRAMUSCULAR | Status: DC | PRN
  Administered 2015-01-21 (×4): 25 ug via INTRAVENOUS

## 2015-01-21 MED ORDER — IOHEXOL 300 MG/ML  SOLN
INTRAMUSCULAR | Status: DC | PRN
  Administered 2015-01-21: 30 mL via INTRA_ARTERIAL
  Administered 2015-01-21: 75 mL via INTRA_ARTERIAL

## 2015-01-21 MED ORDER — SODIUM CHLORIDE 0.9 % IV SOLN
INTRAVENOUS | Status: DC
  Administered 2015-01-21: 09:00:00 via INTRAVENOUS

## 2015-01-21 MED ORDER — SODIUM CHLORIDE 0.9 % IV SOLN: 250.0000 mL | INTRAVENOUS | Status: DC | PRN

## 2015-01-21 MED ORDER — ONDANSETRON HCL 4 MG/2ML IJ SOLN
INTRAMUSCULAR | Status: AC
  Filled 2015-01-21: qty 2

## 2015-01-21 MED ORDER — MIDAZOLAM HCL 2 MG/2ML IJ SOLN
INTRAMUSCULAR | Status: DC | PRN
  Administered 2015-01-21 (×2): 1 mg via INTRAVENOUS

## 2015-01-21 MED ORDER — SODIUM CHLORIDE 0.9 % IJ SOLN
3.0000 mL | Freq: Two times a day (BID) | INTRAMUSCULAR | Status: DC
Start: 2015-01-21 — End: 2015-01-21
  Administered 2015-01-21: 09:00:00 via INTRAVENOUS

## 2015-01-21 MED ORDER — FENTANYL CITRATE (PF) 100 MCG/2ML IJ SOLN
INTRAMUSCULAR | Status: AC
  Filled 2015-01-21: qty 2

## 2015-01-21 MED ORDER — SODIUM CHLORIDE 0.9 % IJ SOLN: 3.0000 mL | INTRAMUSCULAR | Status: DC | PRN

## 2015-01-21 MED ORDER — MIDAZOLAM HCL 2 MG/2ML IJ SOLN
INTRAMUSCULAR | Status: AC
  Filled 2015-01-21: qty 2

## 2015-01-21 MED ORDER — ONDANSETRON HCL 4 MG/2ML IJ SOLN
INTRAMUSCULAR | Status: DC | PRN
  Administered 2015-01-21: 4 mg via INTRAVENOUS

## 2015-01-21 SURGICAL SUPPLY — 10 items
CATH INFINITI 5FR ANG PIGTAIL (CATHETERS) ×2 IMPLANT
CATH INFINITI 5FR JL4 (CATHETERS) ×2 IMPLANT
CATH INFINITI JR4 5F (CATHETERS) ×2 IMPLANT
DEVICE CLOSURE MYNXGRIP 5F (Vascular Products) ×2 IMPLANT
KIT MANI 3VAL PERCEP (MISCELLANEOUS) ×3 IMPLANT
NDL PERC 18GX7CM (NEEDLE) IMPLANT
NEEDLE PERC 18GX7CM (NEEDLE) ×3 IMPLANT
PACK CARDIAC CATH (CUSTOM PROCEDURE TRAY) ×3 IMPLANT
SHEATH AVANTI 5FR X 11CM (SHEATH) ×2 IMPLANT
WIRE EMERALD 3MM-J .035X150CM (WIRE) ×2 IMPLANT

## 2015-01-21 NOTE — Discharge Instructions (Signed)

## 2015-01-25 ENCOUNTER — Other Ambulatory Visit: Payer: Self-pay | Admitting: Internal Medicine

## 2015-01-25 ENCOUNTER — Encounter: Payer: Self-pay | Admitting: Internal Medicine

## 2015-02-02 ENCOUNTER — Ambulatory Visit (INDEPENDENT_AMBULATORY_CARE_PROVIDER_SITE_OTHER): Payer: BC Managed Care – PPO | Admitting: Neurology

## 2015-02-02 ENCOUNTER — Encounter: Payer: Self-pay | Admitting: Neurology

## 2015-02-02 VITALS — BP 120/82 | HR 82 | Resp 20 | Ht 63.78 in | Wt 281.0 lb

## 2015-02-02 DIAGNOSIS — G40209 Localization-related (focal) (partial) symptomatic epilepsy and epileptic syndromes with complex partial seizures, not intractable, without status epilepticus: Secondary | ICD-10-CM | POA: Diagnosis not present

## 2015-02-02 DIAGNOSIS — G43011 Migraine without aura, intractable, with status migrainosus: Secondary | ICD-10-CM

## 2015-02-02 MED ORDER — TOPIRAMATE 100 MG PO TABS: 100.0000 mg | ORAL_TABLET | Freq: Two times a day (BID) | ORAL | Status: DC

## 2015-02-02 NOTE — Patient Instructions (Signed)
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
## 2015-02-03 ENCOUNTER — Telehealth: Payer: Self-pay | Admitting: Neurology

## 2015-02-03 NOTE — Telephone Encounter (Signed)
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 There is no evidence of chronic head injuries. The patient also advised that she had multiple scans in the past prior to last year here in the [place] area which have shown a normal brain. As subarachnoid posttraumatic hemorrhage is considered a traumatic brain injury and is not a concussion but  a contusion of the brain. CD

## 2015-03-02 ENCOUNTER — Ambulatory Visit: Payer: BC Managed Care – PPO | Attending: Anesthesiology | Admitting: Anesthesiology

## 2015-03-02 ENCOUNTER — Encounter: Payer: Self-pay | Admitting: Anesthesiology

## 2015-03-02 ENCOUNTER — Other Ambulatory Visit: Payer: Self-pay | Admitting: Anesthesiology

## 2015-03-02 VITALS — BP 127/74 | HR 57 | Temp 98.2°F | Resp 16 | Ht 63.0 in | Wt 275.0 lb

## 2015-03-02 DIAGNOSIS — M5136 Other intervertebral disc degeneration, lumbar region: Secondary | ICD-10-CM | POA: Insufficient documentation

## 2015-03-02 DIAGNOSIS — I4891 Unspecified atrial fibrillation: Secondary | ICD-10-CM | POA: Diagnosis not present

## 2015-03-02 DIAGNOSIS — G43909 Migraine, unspecified, not intractable, without status migrainosus: Secondary | ICD-10-CM | POA: Insufficient documentation

## 2015-03-02 DIAGNOSIS — G43009 Migraine without aura, not intractable, without status migrainosus: Secondary | ICD-10-CM

## 2015-03-02 DIAGNOSIS — E669 Obesity, unspecified: Secondary | ICD-10-CM | POA: Diagnosis not present

## 2015-03-02 DIAGNOSIS — M5137 Other intervertebral disc degeneration, lumbosacral region: Secondary | ICD-10-CM

## 2015-03-02 DIAGNOSIS — M545 Low back pain: Secondary | ICD-10-CM | POA: Diagnosis present

## 2015-03-02 DIAGNOSIS — M5440 Lumbago with sciatica, unspecified side: Secondary | ICD-10-CM

## 2015-03-02 DIAGNOSIS — M1288 Other specific arthropathies, not elsewhere classified, other specified site: Secondary | ICD-10-CM | POA: Insufficient documentation

## 2015-03-02 DIAGNOSIS — G545 Neuralgic amyotrophy: Secondary | ICD-10-CM | POA: Insufficient documentation

## 2015-03-02 DIAGNOSIS — K219 Gastro-esophageal reflux disease without esophagitis: Secondary | ICD-10-CM | POA: Diagnosis not present

## 2015-03-02 DIAGNOSIS — G8929 Other chronic pain: Secondary | ICD-10-CM | POA: Diagnosis present

## 2015-03-02 DIAGNOSIS — G40309 Generalized idiopathic epilepsy and epileptic syndromes, not intractable, without status epilepticus: Secondary | ICD-10-CM

## 2015-03-02 DIAGNOSIS — I480 Paroxysmal atrial fibrillation: Secondary | ICD-10-CM

## 2015-03-02 DIAGNOSIS — G40409 Other generalized epilepsy and epileptic syndromes, not intractable, without status epilepticus: Secondary | ICD-10-CM | POA: Insufficient documentation

## 2015-03-02 NOTE — Progress Notes (Signed)
   Subjective:    Patient ID: [name] [name], female    DOB: 1965-09-11, 49 y.o.   MRN: [id number]  HPI    Review of Systems     Objective:   Physical Exam        Assessment & Plan:

## 2015-03-02 NOTE — Progress Notes (Signed)
Subjective:    Patient ID: [name] [name], female    DOB: [birth year], 49 y.o.   MRN: [id number] This is a pleasant and t delightful 49 year old lady who presents with a one-year history of chronic low back pain. She indicates that she sustained a fall at [facility] exactly 1 year and 2 days ago. She fell while one of therides at the facility. This patient was seen by me at the clinic and [facility] and was diagnosed with lumbar degenerative disc disease.. I performed a caudal epidural steroid injection for her without fluoroscopy and she did not get any sustained pain relief.. She is an obese lady and I theorized that because of her weight we did not get optimal needle position. S I am scheduling her to come to [facility] for continued evaluation and possibly interventional pain management using fluoroscopy Back Pain This is a recurrent problem. The current episode started more than 1 year ago. The problem occurs intermittently. The problem has been gradually worsening since onset. The pain is present in the lumbar spine. The quality of the pain is described as shooting. The pain radiates to the left thigh and right knee. The pain is at a severity of 8/10. The pain is moderate. Worse during: Vision indicates that the pain is slowly getting worse and is aggravated by most activity. The symptoms are aggravated by sitting and standing. Stiffness is present all day.   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 She has recently been diagnosed with atrial fibrillation Past surgical history is unremarkable Social and economic history Patient is currently working in  the school system and has never been a smoker She uses alcohol moderately and does not use illicit drugs Legal history: Patient has never had a history with the law.    Review of Systems  Constitutional: Negative.   HENT: Negative.   Eyes: Negative.   Respiratory: Positive for wheezing.   Gastrointestinal:       Patient has a history of GERD and he uses all neap resolved for that symptom  Endocrine: Negative.   Genitourinary: Negative.   Musculoskeletal: Positive for back pain.  Allergic/Immunologic: Positive for environmental allergies.  Neurological: Positive for seizures.       Patient has a long history of convulsive seizures and uses Topamax lamictal and clonazepam for these seizures  Hematological: Negative.   Psychiatric/Behavioral: Negative.        Objective:   Physical Exam  Constitutional: She is oriented to person, place, and time. She appears well-developed.  This patient is moderately obese and this is more accentuated because of her short stature She is 275 pounds and is 5 feet 3 inches  HENT:  Head: Normocephalic and atraumatic.  Right Ear: External ear normal.  Left Ear: External ear normal.  Mouth/Throat: Oropharynx is clear and moist.  Eyes: Conjunctivae are normal. Pupils are equal, round, and reactive to light. Right eye exhibits no discharge. Left eye exhibits no discharge.  Neck: No JVD present. No tracheal deviation present. No thyromegaly present.  Cardiovascular: Normal rate, regular rhythm and normal heart sounds.   He has recently been diagnosed with atrial fibrillation  Pulmonary/Chest: No stridor.  Abdominal: Bowel sounds are normal. She exhibits pulsatile midline mass. She exhibits no shifting  dullness, no distension, no pulsatile liver, no fluid wave, no abdominal bruit, no ascites and no mass. There is no splenomegaly or hepatomegaly. There is no tenderness. There is no rebound and no CVA tenderness.  Musculoskeletal:       Lumbar back: She  exhibits decreased range of motion, tenderness, pain and spasm. She exhibits no bony tenderness, no swelling, no edema, no deformity, no laceration and normal pulse.       Back:  She is tender in the lumbosacral area and has limited range of motion. As her straight leg raising test on the left side was 50 while her straight leg raising test on the right side was 40 Torsion test was positive for facetogenic disease  Lymphadenopathy:    She has no cervical adenopathy.  Neurological: She is alert and oriented to person, place, and time. She has normal reflexes. She displays normal reflexes. No cranial nerve deficit. She exhibits normal muscle tone. Coordination normal.  Skin: Skin is warm, dry and intact.  Psychiatric: She has a normal mood and affect. Her behavior is normal.          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 approximately 6 months ago and was treated with caudal epidural steroid injection without fluoroscopy. Since she did not obtain pain relief and planning to perform another procedure for help with fluoroscopy hair at [facility] I have reviewed her MRI and the MRI shows degenerative disc disease with mild facet arthropathy at L4 on L5 there was no evidence of spinal stenosis or nerve root encroachment. Allergies therefore plan to perform a bilateral lumbar transforaminal epidural steroid injection at L4 and L5 for her in the next 10 days. I would also request that she discontinue taken aspirin as of today so that she would be off aspirin 10 days before the procedure I will refrain from giving her any medication so that we can fully evaluate the effects of the procedure. Procedure  PLAN OF MANAGEMENT: 1 Forbilateral  lumbar transforaminal epidural steroid injection at L4-L5 2 Will plan to perform the procedure next Friday on 03/11/2059 3. Will discontinue aspirin as of today 4 will not alter her medical regimen but will wait until after the procedure to assess her response to the bilateral lumbar transforaminal epidural steroid injection at L4-L5

## 2015-03-02 NOTE — Patient Instructions (Signed)
Epidural Steroid Injection Patient Information  Description: The epidural space surrounds the nerves as they exit the spinal cord.  In some patients, the nerves can be compressed and inflamed by a bulging disc or a tight spinal canal (spinal stenosis).  By injecting steroids into the epidural space, we can bring irritated nerves into direct contact with a potentially helpful medication.  These steroids act directly on the irritated nerves and can reduce swelling and inflammation which often leads to decreased pain.  Epidural steroids may be injected anywhere along the spine and from the neck to the low back depending upon the location of your pain.   After numbing the skin with local anesthetic (like Novocaine), a small needle is passed into the epidural space slowly.  You may experience a sensation of pressure while this is being done.  The entire block usually last less than 10 minutes.  Conditions which may be treated by epidural steroids:   Low back and leg pain  Neck and arm pain  Spinal stenosis  Post-laminectomy syndrome  Herpes zoster (shingles) pain  Pain from compression fractures  Preparation for the injection:  1. Do not eat any solid food or dairy products within 6 hours of your appointment.  2. You may drink clear liquids up to 2 hours before appointment.  Clear liquids include water, black coffee, juice or soda.  No milk or cream please. 3. You may take your regular medication, including pain medications, with a sip of water before your appointment  Diabetics should hold regular insulin (if taken separately) and take 1/2 normal NPH dos the morning of the procedure.  Carry some sugar containing items with you to your appointment. 4. A driver must accompany you and be prepared to drive you home after your procedure.  5. Bring all your current medications with your. 6. An IV may be inserted and sedation may be given at the discretion of the physician.   7. A blood pressure  cuff, EKG and other monitors will often be applied during the procedure.  Some patients may need to have extra oxygen administered for a short period. 8. You will be asked to provide medical information, including your allergies, prior to the procedure.  We must know immediately if you are taking blood thinners (like Coumadin/Warfarin)  Or if you are allergic to IV iodine contrast (dye). We must know if you could possible be pregnant.  Possible side-effects:  Bleeding from needle site  Infection (rare, may require surgery)  Nerve injury (rare)  Numbness & tingling (temporary)  Difficulty urinating (rare, temporary)  Spinal headache ( a headache worse with upright posture)  Light -headedness (temporary)  Pain at injection site (several days)  Decreased blood pressure (temporary)  Weakness in arm/leg (temporary)  Pressure sensation in back/neck (temporary)  Call if you experience:  Fever/chills associated with headache or increased back/neck pain.  Headache worsened by an upright position.  New onset weakness or numbness of an extremity below the injection site  Hives or difficulty breathing (go to the emergency room)  Inflammation or drainage at the infection site  Severe back/neck pain  Any new symptoms which are concerning to you  Please note:  Although the local anesthetic injected can often make your back or neck feel good for several hours after the injection, the pain will likely return.  It takes 3-7 days for steroids to work in the epidural space.  You may not notice any pain relief for at least that one week.    If effective, we will often do a series of three injections spaced 3-6 weeks apart to maximally decrease your pain.  After the initial series, we generally will wait several months before considering a repeat injection of the same type.  If you have any questions, please call [phone] [facility] 

## 2015-03-02 NOTE — Progress Notes (Signed)
Safety precautions to be maintained throughout the outpatient stay will include: orient to surroundings, keep bed in low position, maintain call bell within reach at all times, provide assistance with transfer out of bed and ambulation.  

## 2015-03-07 ENCOUNTER — Telehealth: Payer: Self-pay

## 2015-03-07 DIAGNOSIS — E559 Vitamin D deficiency, unspecified: Secondary | ICD-10-CM

## 2015-03-07 DIAGNOSIS — R7989 Other specified abnormal findings of blood chemistry: Secondary | ICD-10-CM

## 2015-03-07 DIAGNOSIS — R7309 Other abnormal glucose: Secondary | ICD-10-CM

## 2015-03-07 NOTE — Telephone Encounter (Signed)
Left message for pt to call office--needs 19month f/u labs of TSH, Hgb A1C, Vitamin D level.

## 2015-03-07 NOTE — Telephone Encounter (Signed)
Pt returns call, and will probably come tomorrow for lab work.

## 2015-03-08 ENCOUNTER — Ambulatory Visit (INDEPENDENT_AMBULATORY_CARE_PROVIDER_SITE_OTHER): Payer: BC Managed Care – PPO | Admitting: Neurology

## 2015-03-08 ENCOUNTER — Other Ambulatory Visit: Payer: BC Managed Care – PPO

## 2015-03-08 DIAGNOSIS — E559 Vitamin D deficiency, unspecified: Secondary | ICD-10-CM

## 2015-03-08 DIAGNOSIS — G4730 Sleep apnea, unspecified: Secondary | ICD-10-CM | POA: Diagnosis not present

## 2015-03-08 DIAGNOSIS — R7309 Other abnormal glucose: Secondary | ICD-10-CM

## 2015-03-08 DIAGNOSIS — E66813 Obesity, class 3: Secondary | ICD-10-CM

## 2015-03-08 DIAGNOSIS — R7989 Other specified abnormal findings of blood chemistry: Secondary | ICD-10-CM

## 2015-03-08 DIAGNOSIS — G43011 Migraine without aura, intractable, with status migrainosus: Secondary | ICD-10-CM

## 2015-03-08 DIAGNOSIS — E662 Morbid (severe) obesity with alveolar hypoventilation: Secondary | ICD-10-CM

## 2015-03-08 DIAGNOSIS — G40209 Localization-related (focal) (partial) symptomatic epilepsy and epileptic syndromes with complex partial seizures, not intractable, without status epilepticus: Secondary | ICD-10-CM

## 2015-03-08 NOTE — Sleep Study (Signed)
Please see the scanned sleep study interpretation located in the Procedure tab within the Chart Review section. 

## 2015-03-10 LAB — HEMOGLOBIN A1C
ESTIMATED AVERAGE GLUCOSE: 123 mg/dL
HEMOGLOBIN A1C: 5.9 % — AB (ref 4.8–5.6)

## 2015-03-10 NOTE — Addendum Note (Signed)
Addended by: Morley Kos on: 03/10/2015 02:51 PM   Modules accepted: Level of Service

## 2015-03-11 ENCOUNTER — Ambulatory Visit: Payer: BC Managed Care – PPO | Attending: Anesthesiology | Admitting: Anesthesiology

## 2015-03-11 ENCOUNTER — Encounter: Payer: Self-pay | Admitting: Anesthesiology

## 2015-03-11 VITALS — BP 128/52 | HR 56 | Temp 98.2°F | Resp 16 | Ht 63.0 in | Wt 278.0 lb

## 2015-03-11 DIAGNOSIS — M545 Low back pain, unspecified: Secondary | ICD-10-CM

## 2015-03-11 DIAGNOSIS — M5136 Other intervertebral disc degeneration, lumbar region: Secondary | ICD-10-CM | POA: Insufficient documentation

## 2015-03-11 DIAGNOSIS — G8929 Other chronic pain: Secondary | ICD-10-CM | POA: Insufficient documentation

## 2015-03-11 DIAGNOSIS — M5416 Radiculopathy, lumbar region: Secondary | ICD-10-CM | POA: Insufficient documentation

## 2015-03-11 DIAGNOSIS — E669 Obesity, unspecified: Secondary | ICD-10-CM | POA: Diagnosis not present

## 2015-03-11 LAB — TSH: TSH: 0.639 u[IU]/mL (ref 0.450–4.500)

## 2015-03-11 MED ORDER — TRIAMCINOLONE ACETONIDE 40 MG/ML IJ SUSP
INTRAMUSCULAR | Status: AC
  Administered 2015-03-11: 10:00:00
  Filled 2015-03-11: qty 2

## 2015-03-11 MED ORDER — BUPIVACAINE HCL (PF) 0.25 % IJ SOLN
INTRAMUSCULAR | Status: AC
  Administered 2015-03-11: 10:00:00
  Filled 2015-03-11: qty 30

## 2015-03-11 MED ORDER — MIDAZOLAM HCL 5 MG/5ML IJ SOLN
INTRAMUSCULAR | Status: AC
  Administered 2015-03-11: 2 mg
  Filled 2015-03-11: qty 5

## 2015-03-11 MED ORDER — IOPAMIDOL (ISOVUE-M 200) INJECTION 41%
INTRAMUSCULAR | Status: AC
Start: 2015-03-11 — End: 2015-03-11
  Administered 2015-03-11: 10:00:00
  Filled 2015-03-11: qty 10

## 2015-03-11 MED ORDER — FENTANYL CITRATE (PF) 100 MCG/2ML IJ SOLN
INTRAMUSCULAR | Status: AC
  Administered 2015-03-11: 100 ug via INTRAVENOUS
  Filled 2015-03-11: qty 2

## 2015-03-11 NOTE — Patient Instructions (Signed)
Epidural Steroid Injection Patient Information  Description: The epidural space surrounds the nerves as they exit the spinal cord.  In some patients, the nerves can be compressed and inflamed by a bulging disc or a tight spinal canal (spinal stenosis).  By injecting steroids into the epidural space, we can bring irritated nerves into direct contact with a potentially helpful medication.  These steroids act directly on the irritated nerves and can reduce swelling and inflammation which often leads to decreased pain.  Epidural steroids may be injected anywhere along the spine and from the neck to the low back depending upon the location of your pain.   After numbing the skin with local anesthetic (like Novocaine), a small needle is passed into the epidural space slowly.  You may experience a sensation of pressure while this is being done.  The entire block usually last less than 10 minutes.  Conditions which may be treated by epidural steroids:   Low back and leg pain  Neck and arm pain  Spinal stenosis  Post-laminectomy syndrome  Herpes zoster (shingles) pain  Pain from compression fractures  Preparation for the injection:  1. Do not eat any solid food or dairy products within 6 hours of your appointment.  2. You may drink clear liquids up to 2 hours before appointment.  Clear liquids include water, black coffee, juice or soda.  No milk or cream please. 3. You may take your regular medication, including pain medications, with a sip of water before your appointment  Diabetics should hold regular insulin (if taken separately) and take 1/2 normal NPH dos the morning of the procedure.  Carry some sugar containing items with you to your appointment. 4. A driver must accompany you and be prepared to drive you home after your procedure.  5. Bring all your current medications with your. 6. An IV may be inserted and sedation may be given at the discretion of the physician.   7. A blood pressure  cuff, EKG and other monitors will often be applied during the procedure.  Some patients may need to have extra oxygen administered for a short period. 8. You will be asked to provide medical information, including your allergies, prior to the procedure.  We must know immediately if you are taking blood thinners (like Coumadin/Warfarin)  Or if you are allergic to IV iodine contrast (dye). We must know if you could possible be pregnant.  Possible side-effects:  Bleeding from needle site  Infection (rare, may require surgery)  Nerve injury (rare)  Numbness & tingling (temporary)  Difficulty urinating (rare, temporary)  Spinal headache ( a headache worse with upright posture)  Light -headedness (temporary)  Pain at injection site (several days)  Decreased blood pressure (temporary)  Weakness in arm/leg (temporary)  Pressure sensation in back/neck (temporary)  Call if you experience:  Fever/chills associated with headache or increased back/neck pain.  Headache worsened by an upright position.  New onset weakness or numbness of an extremity below the injection site  Hives or difficulty breathing (go to the emergency room)  Inflammation or drainage at the infection site  Severe back/neck pain  Any new symptoms which are concerning to you  Please note:  Although the local anesthetic injected can often make your back or neck feel good for several hours after the injection, the pain will likely return.  It takes 3-7 days for steroids to work in the epidural space.  You may not notice any pain relief for at least that one week.    If effective, we will often do a series of three injections spaced 3-6 weeks apart to maximally decrease your pain.  After the initial series, we generally will wait several months before considering a repeat injection of the same type.  If you have any questions, please call [phone] [facility]  Discharge Instructions  General Discharge Instructions :  If you need to reach your doctor call: Monday-Friday 8:00 am - 4:00 pm at [phone] or toll free [phone].  After clinic hours [phone] to have operator reach doctor.  Bring all of your medication bottles to all your appointments in the pain clinic.  To cancel or reschedule your appointment with Pain Management please remember to call 24 hours in advance to avoid a fee.  Refer to the educational materials which you have been given on: General Risks, I had my Procedure. Discharge Instructions, Post Sedation.  Post Procedure Instructions:  The drugs you were given will stay in your system until tomorrow, so for the next 24 hours you should not drive, make any legal decisions or drink any alcoholic beverages.  You may eat anything you prefer, but it is better to start with liquids then soups and crackers, and gradually work up to solid foods.  Please notify your doctor immediately if you have any unusual bleeding, trouble breathing or pain that is not related to your normal pain.  Depending on the type of procedure that was done, some parts of your body may feel week and/or numb.  This usually clears up by tonight or the next day.  Walk with the use of an assistive device or accompanied by an adult for the 24 hours.  You may use ice on the affected area for the first 24 hours.  Put ice in a Ziploc bag and cover with a towel and place against area 15 minutes on 15 minutes off.  You may switch to heat after 24 hours. 

## 2015-03-11 NOTE — Progress Notes (Signed)
Safety precautions to be maintained throughout the outpatient stay will include: orient to surroundings, keep bed in low position, maintain call bell within reach at all times, provide assistance with transfer out of bed and ambulation.  

## 2015-03-11 NOTE — Progress Notes (Signed)
Subjective:    Patient ID: [name] [name], female    DOB: 1966-01-31, 49 y.o.   MRN: [id number]  HPI   Date of procedure 03/11/2015  Preoperative diagnosis 1 chronic low back pain 2 lumbar degenerative disc disease 3 lumbar radiculopathy 4 mild obesity  Postoperative diagnosis Same  Procedure planned 1 Bilateral lumbar transforaminal epidural steroid injection at L4-L5 2 epidurogram with interpretation 3 fluoroscopic guidance  Surgeon Beryle Flock M.D.  AnesthesiaTimeout was obtained with the nursing staff and we confirmed that this was a bilateral procedure  Intravenous sedation administered by Dr. Beryle Flock  Informed consent was obtained and the patient appeared to accept and understand the benefits and risks of this procedure  Timeout was performed with the nursing staff and it was confirmed that this was a bilateral procedure   Description of procedure  The patient was taken to the procedure room and was placed in the prone position  Intravenous sedation was administered by the nurse and staff under my direction  After appropriate sedation the lumbar sacral area was prepped with Betadine then the patient was draped in the lumbar sacral area  Fluoroscopic view of the lumbar spine was done and the pedicle on the right side was visualized and the skin over the 6:00 position of the pedicle on the right side of L4 and L5 was infiltrated with 3 cc of 1% lidocaine using a 25-gauge needle. Adequate visualization of the pedicle was obtained by using an oblique fluoroscopic view then a 22-gauge 3-1/2 inch needle was introduced through the skin and directed towards the 6:00 position of the pedicles at L4 and L5 after obtaining a lateral view it was clear that day 3 and half needle was too short for this patient.  And a 5-1/2 22-gauge needle was then inserted and directed towards the 6:00 position of the pedicles at L4 on L5 this time the needle length was adequate. The needles  were advanced into the superior medial portion of the foramen at L4 and L5. The lateral view was utilized to ensure optimal needle position and depth in the foramen.   Epidurogram with interpretation  After negative aspiration each needle was injected with 1 cc of Isovue in the performance of independent epidurograms purposes of detailing epidural anatomy along with the nerve root anatomy of the corresponding level was. Further the visualization of the contrast media was also to ensure adequate spread of the injectate into the presumed affected areas.  No intravascular or intrathecal spread was observed at any level and there were no corresponding paresthesias upon injection dural grahams were visualized along with the spread of the contrast media into the exiting nerve root   Transforaminal epidural steroid injection  Again aspirations were negative for blood and CSF and other bodily fluids. Then 1-1/2 cc of 0.25% bupivacaine and 20 mg of Kenalog were injected into the L4-L5 foramina on the right side needles were removed and adequate hemostasis was obtained.  The same procedure was done on the left side with identification of the pedicles of L4 and L5 on the left side. Lateral views confirmed needle placement of the 5-1/2 inch 22-gauge needle and epidurograms were performed to confirm the location. Contrast media was injected and adequate needle positioning was obtained.  Transforaminal epidural steroid injection was performed on the left side of L4 and L5 with injected in 1/2 cc of 0. to 5% bupivacaine and 20 mg of Kenalog were injected at L4 on L5 foramen   The patient  tolerated procedure quite well and the needles were removed  Adequate hemostasis was established and vital signs were stable  The patient was taken to the recovery room in satisfactory condition where she was observed for approximately 45 minutes and subsequently discharged home.  We'll follow-up with the patient the clinic in the  next 2-3 weeks.    Review of Systems     Objective:   Physical Exam        Assessment & Plan:

## 2015-03-16 ENCOUNTER — Ambulatory Visit: Payer: BC Managed Care – PPO | Admitting: Anesthesiology

## 2015-03-18 ENCOUNTER — Telehealth: Payer: Self-pay | Admitting: *Deleted

## 2015-03-18 NOTE — Telephone Encounter (Signed)
Form,DMV sent to [facility] and Dr Brett Fairy 03/18/15.

## 2015-03-20 LAB — VITAMIN D 1,25 DIHYDROXY
VITAMIN D 1, 25 (OH) TOTAL: 47 pg/mL
VITAMIN D2 1, 25 (OH): 23 pg/mL
Vitamin D3 1, 25 (OH)2: 24 pg/mL

## 2015-03-21 ENCOUNTER — Telehealth: Payer: Self-pay

## 2015-03-21 NOTE — Telephone Encounter (Signed)
Called pt and advised her of her sleep study results. Informed her that her study did not reveal significant sleep apnea or significant periodic limb movements results in sleep disruption. There was no evidence of obesity hypoventilation. Advised pt to avoid sedatives, alcohol, and tobacco and to lose weight, diet, and exercise. Advised that an ENT exam or dental device can be used if apnea or snoring is a clinical concern. Pt denied a follow up appt at [facility] at this time.

## 2015-03-21 NOTE — Telephone Encounter (Signed)
DMV disability placard form complete and given to Butch Penny in [place].

## 2015-03-30 ENCOUNTER — Ambulatory Visit: Payer: BC Managed Care – PPO | Admitting: Anesthesiology

## 2015-03-31 ENCOUNTER — Telehealth: Payer: Self-pay | Admitting: *Deleted

## 2015-03-31 DIAGNOSIS — Z0289 Encounter for other administrative examinations: Secondary | ICD-10-CM

## 2015-03-31 NOTE — Telephone Encounter (Signed)
Patient form from [facility] on [employer].

## 2015-04-01 ENCOUNTER — Telehealth: Payer: Self-pay | Admitting: *Deleted

## 2015-04-01 NOTE — Telephone Encounter (Signed)
Patient form was faxed on 04/01/15 to [facility].

## 2015-04-06 ENCOUNTER — Encounter: Payer: Self-pay | Admitting: Anesthesiology

## 2015-04-06 ENCOUNTER — Ambulatory Visit: Payer: BC Managed Care – PPO | Attending: Anesthesiology | Admitting: Anesthesiology

## 2015-04-06 VITALS — BP 132/77 | HR 67 | Temp 98.3°F | Resp 15 | Ht 63.0 in | Wt 259.0 lb

## 2015-04-06 DIAGNOSIS — G43009 Migraine without aura, not intractable, without status migrainosus: Secondary | ICD-10-CM | POA: Insufficient documentation

## 2015-04-06 DIAGNOSIS — I480 Paroxysmal atrial fibrillation: Secondary | ICD-10-CM | POA: Diagnosis not present

## 2015-04-06 DIAGNOSIS — G40409 Other generalized epilepsy and epileptic syndromes, not intractable, without status epilepticus: Secondary | ICD-10-CM | POA: Diagnosis not present

## 2015-04-06 DIAGNOSIS — M545 Low back pain, unspecified: Secondary | ICD-10-CM

## 2015-04-06 DIAGNOSIS — E669 Obesity, unspecified: Secondary | ICD-10-CM | POA: Diagnosis not present

## 2015-04-06 DIAGNOSIS — K219 Gastro-esophageal reflux disease without esophagitis: Secondary | ICD-10-CM | POA: Diagnosis not present

## 2015-04-06 DIAGNOSIS — M5430 Sciatica, unspecified side: Secondary | ICD-10-CM | POA: Insufficient documentation

## 2015-04-06 DIAGNOSIS — M5136 Other intervertebral disc degeneration, lumbar region: Secondary | ICD-10-CM | POA: Insufficient documentation

## 2015-04-06 DIAGNOSIS — M5416 Radiculopathy, lumbar region: Secondary | ICD-10-CM

## 2015-04-06 DIAGNOSIS — M47816 Spondylosis without myelopathy or radiculopathy, lumbar region: Secondary | ICD-10-CM

## 2015-04-06 NOTE — Patient Instructions (Signed)
GENERAL RISKS AND COMPLICATIONS  What are the risk, side effects and possible complications? Generally speaking, most procedures are safe.  However, with any procedure there are risks, side effects, and the possibility of complications.  The risks and complications are dependent upon the sites that are lesioned, or the type of nerve block to be performed.  The closer the procedure is to the spine, the more serious the risks are.  Great care is taken when placing the radio frequency needles, block needles or lesioning probes, but sometimes complications can occur. 1. Infection: Any time there is an injection through the skin, there is a risk of infection.  This is why sterile conditions are used for these blocks.  There are four possible types of infection. 1. Localized skin infection. 2. Central Nervous System Infection-This can be in the form of Meningitis, which can be deadly. 3. Epidural Infections-This can be in the form of an epidural abscess, which can cause pressure inside of the spine, causing compression of the spinal cord with subsequent paralysis. This would require an emergency surgery to decompress, and there are no guarantees that the patient would recover from the paralysis. 4. Discitis-This is an infection of the intervertebral discs.  It occurs in about 1% of discography procedures.  It is difficult to treat and it may lead to surgery.        2. Pain: the needles have to go through skin and soft tissues, will cause soreness.       3. Damage to internal structures:  The nerves to be lesioned may be near blood vessels or    other nerves which can be potentially damaged.       4. Bleeding: Bleeding is more common if the patient is taking blood thinners such as  aspirin, Coumadin, Ticiid, Plavix, etc., or if he/she have some genetic predisposition  such as hemophilia. Bleeding into the spinal canal can cause compression of the spinal  cord with subsequent paralysis.  This would require an  emergency surgery to  decompress and there are no guarantees that the patient would recover from the  paralysis.       5. Pneumothorax:  Puncturing of a lung is a possibility, every time a needle is introduced in  the area of the chest or upper back.  Pneumothorax refers to free air around the  collapsed lung(s), inside of the thoracic cavity (chest cavity).  Another two possible  complications related to a similar event would include: Hemothorax and Chylothorax.   These are variations of the Pneumothorax, where instead of air around the collapsed  lung(s), you may have blood or chyle, respectively.       6. Spinal headaches: They may occur with any procedures in the area of the spine.       7. Persistent CSF (Cerebro-Spinal Fluid) leakage: This is a rare problem, but may occur  with prolonged intrathecal or epidural catheters either due to the formation of a fistulous  track or a dural tear.       8. Nerve damage: By working so close to the spinal cord, there is always a possibility of  nerve damage, which could be as serious as a permanent spinal cord injury with  paralysis.       9. Death:  Although rare, severe deadly allergic reactions known as "Anaphylactic  reaction" can occur to any of the medications used.      10. Worsening of the symptoms:  We can always make thing worse.    What are the chances of something like this happening? Chances of any of this occuring are extremely low.  By statistics, you have more of a chance of getting killed in a motor vehicle accident: while driving to the hospital than any of the above occurring .  Nevertheless, you should be aware that they are possibilities.  In general, it is similar to taking a shower.  Everybody knows that you can slip, hit your head and get killed.  Does that mean that you should not shower again?  Nevertheless always keep in mind that statistics do not mean anything if you happen to be on the wrong side of them.  Even if a procedure has a 1  (one) in a 1,000,000 (million) chance of going wrong, it you happen to be that one..Also, keep in mind that by statistics, you have more of a chance of having something go wrong when taking medications.  Who should not have this procedure? If you are on a blood thinning medication (e.g. Coumadin, Plavix, see list of "Blood Thinners"), or if you have an active infection going on, you should not have the procedure.  If you are taking any blood thinners, please inform your physician.  How should I prepare for this procedure?  Do not eat or drink anything at least six hours prior to the procedure.  Bring a driver with you .  It cannot be a taxi.  Come accompanied by an adult that can drive you back, and that is strong enough to help you if your legs get weak or numb from the local anesthetic.  Take all of your medicines the morning of the procedure with just enough water to swallow them.  If you have diabetes, make sure that you are scheduled to have your procedure done first thing in the morning, whenever possible.  If you have diabetes, take only half of your insulin dose and notify our nurse that you have done so as soon as you arrive at the clinic.  If you are diabetic, but only take blood sugar pills (oral hypoglycemic), then do not take them on the morning of your procedure.  You may take them after you have had the procedure.  Do not take aspirin or any aspirin-containing medications, at least eleven (11) days prior to the procedure.  They may prolong bleeding.  Wear loose fitting clothing that may be easy to take off and that you would not mind if it got stained with Betadine or blood.  Do not wear any jewelry or perfume  Remove any nail coloring.  It will interfere with some of our monitoring equipment.  NOTE: Remember that this is not meant to be interpreted as a complete list of all possible complications.  Unforeseen problems may occur.  BLOOD THINNERS The following drugs  contain aspirin or other products, which can cause increased bleeding during surgery and should not be taken for 2 weeks prior to and 1 week after surgery.  If you should need take something for relief of minor pain, you may take acetaminophen which is found in Tylenol,m Datril, Anacin-3 and Panadol. It is not blood thinner. The products listed below are.  Do not take any of the products listed below in addition to any listed on your instruction sheet.  A.P.C or A.P.C with Codeine Codeine Phosphate Capsules #3 Ibuprofen Ridaura  ABC compound Congesprin Imuran rimadil  Advil Cope Indocin Robaxisal  Alka-Seltzer Effervescent Pain Reliever and Antacid Coricidin or Coricidin-D  Indomethacin Rufen    Alka-Seltzer plus Cold Medicine Cosprin Ketoprofen S-A-C Tablets  Anacin Analgesic Tablets or Capsules Coumadin Korlgesic Salflex  Anacin Extra Strength Analgesic tablets or capsules CP-2 Tablets Lanoril Salicylate  Anaprox Cuprimine Capsules Levenox Salocol  Anexsia-D Dalteparin Magan Salsalate  Anodynos Darvon compound Magnesium Salicylate Sine-off  Ansaid Dasin Capsules Magsal Sodium Salicylate  Anturane Depen Capsules Marnal Soma  APF Arthritis pain formula Dewitt's Pills Measurin Stanback  Argesic Dia-Gesic Meclofenamic Sulfinpyrazone  Arthritis Bayer Timed Release Aspirin Diclofenac Meclomen Sulindac  Arthritis pain formula Anacin Dicumarol Medipren Supac  Analgesic (Safety coated) Arthralgen Diffunasal Mefanamic Suprofen  Arthritis Strength Bufferin Dihydrocodeine Mepro Compound Suprol  Arthropan liquid Dopirydamole Methcarbomol with Aspirin Synalgos  ASA tablets/Enseals Disalcid Micrainin Tagament  Ascriptin Doan's Midol Talwin  Ascriptin A/D Dolene Mobidin Tanderil  Ascriptin Extra Strength Dolobid Moblgesic Ticlid  Ascriptin with Codeine Doloprin or Doloprin with Codeine Momentum Tolectin  Asperbuf Duoprin Mono-gesic Trendar  Aspergum Duradyne Motrin or Motrin IB Triminicin  Aspirin  plain, buffered or enteric coated Durasal Myochrisine Trigesic  Aspirin Suppositories Easprin Nalfon Trillsate  Aspirin with Codeine Ecotrin Regular or Extra Strength Naprosyn Uracel  Atromid-S Efficin Naproxen Ursinus  Auranofin Capsules Elmiron Neocylate Vanquish  Axotal Emagrin Norgesic Verin  Azathioprine Empirin or Empirin with Codeine Normiflo Vitamin E  Azolid Emprazil Nuprin Voltaren  Bayer Aspirin plain, buffered or children's or timed BC Tablets or powders Encaprin Orgaran Warfarin Sodium  Buff-a-Comp Enoxaparin Orudis Zorpin  Buff-a-Comp with Codeine Equegesic Os-Cal-Gesic   Buffaprin Excedrin plain, buffered or Extra Strength Oxalid   Bufferin Arthritis Strength Feldene Oxphenbutazone   Bufferin plain or Extra Strength Feldene Capsules Oxycodone with Aspirin   Bufferin with Codeine Fenoprofen Fenoprofen Pabalate or Pabalate-SF   Buffets II Flogesic Panagesic   Buffinol plain or Extra Strength Florinal or Florinal with Codeine Panwarfarin   Buf-Tabs Flurbiprofen Penicillamine   Butalbital Compound Four-way cold tablets Penicillin   Butazolidin Fragmin Pepto-Bismol   Carbenicillin Geminisyn Percodan   Carna Arthritis Reliever Geopen Persantine   Carprofen Gold's salt Persistin   Chloramphenicol Goody's Phenylbutazone   Chloromycetin Haltrain Piroxlcam   Clmetidine heparin Plaquenil   Cllnoril Hyco-pap Ponstel   Clofibrate Hydroxy chloroquine Propoxyphen         Before stopping any of these medications, be sure to consult the physician who ordered them.  Some, such as Coumadin (Warfarin) are ordered to prevent or treat serious conditions such as "deep thrombosis", "pumonary embolisms", and other heart problems.  The amount of time that you may need off of the medication may also vary with the medication and the reason for which you were taking it.  If you are taking any of these medications, please make sure you notify your pain physician before you undergo any  procedures.         Facet Joint Block The facet joints connect the bones of the spine (vertebrae). They make it possible for you to bend, twist, and make other movements with your spine. They also prevent you from overbending, overtwisting, and making other excessive movements.  A facet joint block is a procedure where a numbing medicine (anesthetic) is injected into a facet joint. Often, a type of anti-inflammatory medicine called a steroid is also injected. A facet joint block may be done for two reasons:  2. Diagnosis. A facet joint block may be done as a test to see whether neck or back pain is caused by a worn-down or infected facet joint. If the pain gets better after a facet joint block, it means the   pain is probably coming from the facet joint. If the pain does not get better, it means the pain is probably not coming from the facet joint.  3. Therapy. A facet joint block may be done to relieve neck or back pain caused by a facet joint. A facet joint block is only done as a therapy if the pain does not improve with medicine, exercise programs, physical therapy, and other forms of pain management. LET YOUR HEALTH CARE PROVIDER KNOW ABOUT:   Any allergies you have.   All medicines you are taking, including vitamins, herbs, eyedrops, and over-the-counter medicines and creams.   Previous problems you or members of your family have had with the use of anesthetics.   Any blood disorders you have had.   Other health problems you have. RISKS AND COMPLICATIONS Generally, having a facet joint block is safe. However, as with any procedure, complications can occur. Possible complications associated with having a facet joint block include:   Bleeding.   Injury to a nerve near the injection site.   Pain at the injection site.   Weakness or numbness in areas controlled by nerves near the injection site.   Infection.   Temporary fluid retention.   Allergic reaction to  anesthetics or medicines used during the procedure. BEFORE THE PROCEDURE   Follow your health care provider's instructions if you are taking dietary supplements or medicines. You may need to stop taking them or reduce your dosage.   Do not take any new dietary supplements or medicines without asking your health care provider first.   Follow your health care provider's instructions about eating and drinking before the procedure. You may need to stop eating and drinking several hours before the procedure.   Arrange to have an adult drive you home after the procedure. PROCEDURE 12. You may need to remove your clothing and dress in an open-back gown so that your health care provider can access your spine.  13. The procedure will be done while you are lying on an X-ray table. Most of the time you will be asked to lie on your stomach, but you may be asked to lie in a different position if an injection will be made in your neck.  14. Special machines will be used to monitor your oxygen levels, heart rate, and blood pressure.  15. If an injection will be made in your neck, an intravenous (IV) tube will be inserted into one of your veins. Fluids and medicine will flow directly into your body through the IV tube.  16. The area over the facet joint where the injection will be made will be cleaned with an antiseptic soap. The surrounding skin will be covered with sterile drapes.  17. An anesthetic will be applied to your skin to make the injection area numb. You may feel a temporary stinging or burning sensation.  18. A video X-ray machine will be used to locate the joint. A contrast dye may be injected into the facet joint area to help with locating the joint.  19. When the joint is located, an anesthetic medicine will be injected into the joint through the needle.  20. Your health care provider will ask you whether you feel pain relief. If you do feel relief, a steroid may be injected to provide  pain relief for a longer period of time. If you do not feel relief or feel only partial relief, additional injections of an anesthetic may be made in other facet joints.  21.   The needle will be removed, the skin will be cleansed, and bandages will be applied.  AFTER THE PROCEDURE   You will be observed for 15-30 minutes before being allowed to go home. Do not drive. Have an adult drive you or take a taxi or public transportation instead.   If you feel pain relief, the pain will return in several hours or days when the anesthetic wears off.   You may feel pain relief 2-14 days after the procedure. The amount of time this relief lasts varies from person to person.   It is normal to feel some tenderness over the injected area(s) for 2 days following the procedure.   If you have diabetes, you may have a temporary increase in blood sugar. Document Released: 01/02/2007 Document Revised: 12/28/2013 Document Reviewed: 06/02/2012 [facility] Patient Information 2015 [place], [place]. This information is not intended to replace advice given to you by your health care provider. Make sure you discuss any questions you have with your health care provider.
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
## 2015-04-15 ENCOUNTER — Ambulatory Visit: Payer: BC Managed Care – PPO | Attending: Anesthesiology | Admitting: Anesthesiology

## 2015-04-15 ENCOUNTER — Ambulatory Visit: Payer: BC Managed Care – PPO | Admitting: Anesthesiology

## 2015-04-15 ENCOUNTER — Encounter: Payer: Self-pay | Admitting: Anesthesiology

## 2015-04-15 VITALS — BP 112/68 | HR 70 | Temp 98.8°F | Resp 16 | Ht 63.0 in | Wt 268.0 lb

## 2015-04-15 DIAGNOSIS — I471 Supraventricular tachycardia: Secondary | ICD-10-CM

## 2015-04-15 DIAGNOSIS — K219 Gastro-esophageal reflux disease without esophagitis: Secondary | ICD-10-CM | POA: Diagnosis not present

## 2015-04-15 DIAGNOSIS — M47817 Spondylosis without myelopathy or radiculopathy, lumbosacral region: Secondary | ICD-10-CM

## 2015-04-15 DIAGNOSIS — I480 Paroxysmal atrial fibrillation: Secondary | ICD-10-CM | POA: Insufficient documentation

## 2015-04-15 DIAGNOSIS — M5136 Other intervertebral disc degeneration, lumbar region: Secondary | ICD-10-CM | POA: Insufficient documentation

## 2015-04-15 DIAGNOSIS — G43009 Migraine without aura, not intractable, without status migrainosus: Secondary | ICD-10-CM

## 2015-04-15 DIAGNOSIS — M1288 Other specific arthropathies, not elsewhere classified, other specified site: Secondary | ICD-10-CM | POA: Insufficient documentation

## 2015-04-15 DIAGNOSIS — E669 Obesity, unspecified: Secondary | ICD-10-CM | POA: Insufficient documentation

## 2015-04-15 DIAGNOSIS — M545 Low back pain, unspecified: Secondary | ICD-10-CM

## 2015-04-15 DIAGNOSIS — M5416 Radiculopathy, lumbar region: Secondary | ICD-10-CM | POA: Insufficient documentation

## 2015-04-15 DIAGNOSIS — M5137 Other intervertebral disc degeneration, lumbosacral region: Secondary | ICD-10-CM

## 2015-04-15 MED ORDER — TRIAMCINOLONE ACETONIDE 40 MG/ML IJ SUSP
INTRAMUSCULAR | Status: AC
  Administered 2015-04-15: 10:00:00
  Filled 2015-04-15: qty 2

## 2015-04-15 MED ORDER — IOHEXOL 180 MG/ML  SOLN
INTRAMUSCULAR | Status: AC
  Administered 2015-04-15: 10:00:00
  Filled 2015-04-15: qty 20

## 2015-04-15 MED ORDER — MIDAZOLAM HCL 5 MG/5ML IJ SOLN
INTRAMUSCULAR | Status: AC
  Administered 2015-04-15: 3 mg via INTRAVENOUS
  Filled 2015-04-15: qty 5

## 2015-04-15 MED ORDER — FENTANYL CITRATE (PF) 100 MCG/2ML IJ SOLN
INTRAMUSCULAR | Status: AC
  Administered 2015-04-15: 100 ug
  Filled 2015-04-15: qty 2

## 2015-04-15 MED ORDER — BUPIVACAINE HCL (PF) 0.5 % IJ SOLN
INTRAMUSCULAR | Status: AC
  Administered 2015-04-15: 10:00:00
  Filled 2015-04-15: qty 30

## 2015-04-15 NOTE — Progress Notes (Signed)
Safety precautions to be maintained throughout the outpatient stay will include: orient to surroundings, keep bed in low position, maintain call bell within reach at all times, provide assistance with transfer out of bed and ambulation.  

## 2015-04-15 NOTE — Patient Instructions (Signed)
Pain Management Discharge Instructions  General Discharge Instructions :  If you need to reach your doctor call: Monday-Friday 8:00 am - 4:00 pm at [phone] or toll free [phone].  After clinic hours [phone] to have operator reach doctor.  Bring all of your medication bottles to all your appointments in the pain clinic.  To cancel or reschedule your appointment with Pain Management please remember to call 24 hours in advance to avoid a fee.  Refer to the educational materials which you have been given on: General Risks, I had my Procedure. Discharge Instructions, Post Sedation.  Post Procedure Instructions:  The drugs you were given will stay in your system until tomorrow, so for the next 24 hours you should not drive, make any legal decisions or drink any alcoholic beverages.  You may eat anything you prefer, but it is better to start with liquids then soups and crackers, and gradually work up to solid foods.  Please notify your doctor immediately if you have any unusual bleeding, trouble breathing or pain that is not related to your normal pain.  Depending on the type of procedure that was done, some parts of your body may feel week and/or numb.  This usually clears up by tonight or the next day.  Walk with the use of an assistive device or accompanied by an adult for the 24 hours.  You may use ice on the affected area for the first 24 hours.  Put ice in a Ziploc bag and cover with a towel and place against area 15 minutes on 15 minutes off.  You may switch to heat after 24 hours.Facet Joint Block The facet joints connect the bones of the spine (vertebrae). They make it possible for you to bend, twist, and make other movements with your spine. They also prevent you from overbending, overtwisting, and making other excessive movements.  A facet joint block is a procedure where a numbing medicine (anesthetic) is injected into a facet joint. Often, a type of anti-inflammatory  medicine called a steroid is also injected. A facet joint block may be done for two reasons:   Diagnosis. A facet joint block may be done as a test to see whether neck or back pain is caused by a worn-down or infected facet joint. If the pain gets better after a facet joint block, it means the pain is probably coming from the facet joint. If the pain does not get better, it means the pain is probably not coming from the facet joint.   Therapy. A facet joint block may be done to relieve neck or back pain caused by a facet joint. A facet joint block is only done as a therapy if the pain does not improve with medicine, exercise programs, physical therapy, and other forms of pain management. LET [facility] CARE PROVIDER KNOW ABOUT:   Any allergies you have.   All medicines you are taking, including vitamins, herbs, eyedrops, and over-the-counter medicines and creams.   Previous problems you or members of your family have had with the use of anesthetics.   Any blood disorders you have had.   Other health problems you have. RISKS AND COMPLICATIONS Generally, having a facet joint block is safe. However, as with any procedure, complications can occur. Possible complications associated with having a facet joint block include:   Bleeding.   Injury to a nerve near the injection site.   Pain at the injection site.   Weakness or numbness in areas controlled by nerves near  the injection site.   Infection.   Temporary fluid retention.   Allergic reaction to anesthetics or medicines used during the procedure. BEFORE THE PROCEDURE   Follow your health care provider's instructions if you are taking dietary supplements or medicines. You may need to stop taking them or reduce your dosage.   Do not take any new dietary supplements or medicines without asking your health care provider first.   Follow your health care provider's instructions about eating and drinking before the  procedure. You may need to stop eating and drinking several hours before the procedure.   Arrange to have an adult drive you home after the procedure. PROCEDURE  You may need to remove your clothing and dress in an open-back gown so that your health care provider can access your spine.   The procedure will be done while you are lying on an X-ray table. Most of the time you will be asked to lie on your stomach, but you may be asked to lie in a different position if an injection will be made in your neck.   Special machines will be used to monitor your oxygen levels, heart rate, and blood pressure.   If an injection will be made in your neck, an intravenous (IV) tube will be inserted into one of your veins. Fluids and medicine will flow directly into your body through the IV tube.   The area over the facet joint where the injection will be made will be cleaned with an antiseptic soap. The surrounding skin will be covered with sterile drapes.   An anesthetic will be applied to your skin to make the injection area numb. You may feel a temporary stinging or burning sensation.   A video X-ray machine will be used to locate the joint. A contrast dye may be injected into the facet joint area to help with locating the joint.   When the joint is located, an anesthetic medicine will be injected into the joint through the needle.   Your health care provider will ask you whether you feel pain relief. If you do feel relief, a steroid may be injected to provide pain relief for a longer period of time. If you do not feel relief or feel only partial relief, additional injections of an anesthetic may be made in other facet joints.   The needle will be removed, the skin will be cleansed, and bandages will be applied.  AFTER THE PROCEDURE   You will be observed for 15-30 minutes before being allowed to go home. Do not drive. Have an adult drive you or take a taxi or public transportation instead.    If you feel pain relief, the pain will return in several hours or days when the anesthetic wears off.   You may feel pain relief 2-14 days after the procedure. The amount of time this relief lasts varies from person to person.   It is normal to feel some tenderness over the injected area(s) for 2 days following the procedure.   If you have diabetes, you may have a temporary increase in blood sugar. Document Released: 01/02/2007 Document Revised: 12/28/2013 Document Reviewed: 06/02/2012 [facility] Patient Information 2015 [place], [place]. This information is not intended to replace advice given to you by your health care provider. Make sure you discuss any questions you have with your health care provider.

## 2015-04-15 NOTE — Progress Notes (Signed)
Subjective:    Patient ID: [name] [name], female    DOB: 23-Mar-1966, 49 y.o.   MRN: [id number]  HPI    Review of Systems     Objective:   Physical Exam        Assessment & Plan:    Procedure note  Date of procedure 04/15/2015  Preoperative diagnosis   Diagnoses     Back pain at L4-L5 level - Primary    ICD-9-CM: 724.2 ICD-10-CM: M54.5    DDD (degenerative disc disease), lumbar     ICD-9-CM: 722.52 ICD-10-CM: M51.36    Chronic lumbar radiculopathy     ICD-9-CM: 724.4 ICD-10-CM: M54.16    Facet arthropathy, lumbar     ICD-9-CM: 721.3 ICD-10-CM: M47.816    Migraine without aura and without status migrainosus, not intractable     ICD-9-CM: 346.10 ICD-10-CM: G43.009    Gastroesophageal reflux disease without esophagitis     ICD-9-CM: 530.81 ICD-10-CM: K21.9    Paroxysmal atrial fibrillation     ICD-9-CM: 427.31 ICD-10-CM: I48.0    Obesity     ICD-9-CM: 278.00 ICD-10-CM: E66.9      Postoperative diagnosis Same  Procedure  1 left lumbosacral facet medial branch nerve block injections at L2-L3 L4-L5 sacral ala and S1 2 contrast media injection under fluoroscopic visualization for needle placement and spread of medication verification  Surgeon Lance Bosch M.D.  Anesthesia  Intravenous sedation and Mac anesthesia administered by the nurse and staff under my direction  Informed consent was obtained and the patient appeared to accept and understand the benefits and risks of this procedure. Timeout was done in conjunction with the nursing staff for verification of the side of the procedure  Description of procedure Patient was taken to the procedure room and placed in the prone position. Intravenous sedation and Mac anesthesia was administered by the nurse and staff under my direction.. After appropriate sedation the lumbar sacral area was prepped with Betadine and sterile draping was applied.  Using left  fluoroscopic oblique view the lumbar facets superior arteria cartilage and the transverse processes of L2 L3 L4 and L5 were visualized the target areas for local infiltration and needle placement with the junction of the superior articular cartilage and the transverse process. These target areas at L2 L3 L4 and L5 were infiltrated with 2 cc of 1% lidocaine using a 25-gauge needle. Then 422-gauge 5 inch needles were directed to the junction of the superior articular cartilage and the transverse process on the left side. Lateral fluoroscopic views were used to confirm needle placement.  After appropriate needle positioning 0.5 cc of Omnipaque 180 was injected at each level so that documentation of the absence of intravascular uptake could be established This maneuver also served indicated that there was adequate spread of the medication Then 1 cc of 0.5% bupivacaine and 26 mg of Kenalog were injected at L2 L3 L4 and L5 Postinjection fluoroscopy showed that there was modest washout of the contrast media The procedure was repeated on the left sacral ala and also at the S1 10:00 position in the neural foramen  0.5 mL of Omnipaque 180 was injected over the sacral ala and S1 foramen. No intravascular uptake was observed.  Then 1 cc of bupivacaine 0.5% and 26 mg of Kenalog were injected at both the sacral ala and S1 foramen. Both needles were removed and adequate hemostasis was achieved. Patient tolerated the procedure quite well and vital signs were stable There were no adverse effects  Patient tolerated the procedure  quite well and was taken to the recovery room in satisfactory condition where she was observed for approximately 45 minutes and subsequently discharged home. We'll follow-up with this patient in the next 3 weeks.   Lance Bosch M.D.

## 2015-04-18 ENCOUNTER — Telehealth: Payer: Self-pay | Admitting: *Deleted

## 2015-04-18 NOTE — Telephone Encounter (Signed)
No problems post procedure phone call. 

## 2015-04-22 ENCOUNTER — Encounter (HOSPITAL_COMMUNITY): Payer: Self-pay | Admitting: Emergency Medicine

## 2015-04-22 ENCOUNTER — Observation Stay (HOSPITAL_COMMUNITY)
Admission: EM | Admit: 2015-04-22 | Discharge: 2015-04-25 | Disposition: A | Discharge status: Home / Self Care | Payer: BC Managed Care – PPO | Attending: Internal Medicine | Admitting: Internal Medicine

## 2015-04-22 ENCOUNTER — Emergency Department (HOSPITAL_COMMUNITY): Payer: BC Managed Care – PPO

## 2015-04-22 DIAGNOSIS — I4891 Unspecified atrial fibrillation: Secondary | ICD-10-CM | POA: Diagnosis not present

## 2015-04-22 DIAGNOSIS — R29898 Other symptoms and signs involving the musculoskeletal system: Secondary | ICD-10-CM | POA: Insufficient documentation

## 2015-04-22 DIAGNOSIS — R202 Paresthesia of skin: Secondary | ICD-10-CM | POA: Diagnosis present

## 2015-04-22 DIAGNOSIS — R209 Unspecified disturbances of skin sensation: Secondary | ICD-10-CM | POA: Insufficient documentation

## 2015-04-22 DIAGNOSIS — J45909 Unspecified asthma, uncomplicated: Secondary | ICD-10-CM | POA: Insufficient documentation

## 2015-04-22 DIAGNOSIS — R531 Weakness: Secondary | ICD-10-CM | POA: Diagnosis present

## 2015-04-22 DIAGNOSIS — R200 Anesthesia of skin: Secondary | ICD-10-CM | POA: Diagnosis not present

## 2015-04-22 DIAGNOSIS — I639 Cerebral infarction, unspecified: Secondary | ICD-10-CM | POA: Diagnosis not present

## 2015-04-22 DIAGNOSIS — I6350 Cerebral infarction due to unspecified occlusion or stenosis of unspecified cerebral artery: Secondary | ICD-10-CM | POA: Insufficient documentation

## 2015-04-22 DIAGNOSIS — M6289 Other specified disorders of muscle: Secondary | ICD-10-CM | POA: Insufficient documentation

## 2015-04-22 DIAGNOSIS — J4520 Mild intermittent asthma, uncomplicated: Secondary | ICD-10-CM | POA: Insufficient documentation

## 2015-04-22 DIAGNOSIS — M6281 Muscle weakness (generalized): Secondary | ICD-10-CM | POA: Insufficient documentation

## 2015-04-22 DIAGNOSIS — G40909 Epilepsy, unspecified, not intractable, without status epilepticus: Secondary | ICD-10-CM

## 2015-04-22 DIAGNOSIS — R519 Headache, unspecified: Secondary | ICD-10-CM | POA: Diagnosis present

## 2015-04-22 DIAGNOSIS — R51 Headache: Secondary | ICD-10-CM

## 2015-04-22 DIAGNOSIS — K219 Gastro-esophageal reflux disease without esophagitis: Secondary | ICD-10-CM | POA: Diagnosis present

## 2015-04-22 LAB — URINALYSIS, ROUTINE W REFLEX MICROSCOPIC
BILIRUBIN URINE: NEGATIVE
Glucose, UA: NEGATIVE mg/dL
KETONES UR: NEGATIVE mg/dL
Leukocytes, UA: NEGATIVE
NITRITE: NEGATIVE
PROTEIN: NEGATIVE mg/dL
Specific Gravity, Urine: 1.029 (ref 1.005–1.030)
UROBILINOGEN UA: 0.2 mg/dL (ref 0.0–1.0)
pH: 5.5 (ref 5.0–8.0)

## 2015-04-22 LAB — CBC
HCT: 44.9 % (ref 36.0–46.0)
HEMOGLOBIN: 15 g/dL (ref 12.0–15.0)
MCH: 27.9 pg (ref 26.0–34.0)
MCHC: 33.4 g/dL (ref 30.0–36.0)
MCV: 83.5 fL (ref 78.0–100.0)
PLATELETS: 209 10*3/uL (ref 150–400)
RBC: 5.38 MIL/uL — AB (ref 3.87–5.11)
RDW: 14.6 % (ref 11.5–15.5)
WBC: 12 10*3/uL — AB (ref 4.0–10.5)

## 2015-04-22 LAB — RAPID URINE DRUG SCREEN, HOSP PERFORMED
AMPHETAMINES: NOT DETECTED
BARBITURATES: NOT DETECTED
Benzodiazepines: NOT DETECTED
Cocaine: NOT DETECTED
Opiates: NOT DETECTED
TETRAHYDROCANNABINOL: NOT DETECTED

## 2015-04-22 LAB — COMPREHENSIVE METABOLIC PANEL
ALBUMIN: 4.2 g/dL (ref 3.5–5.0)
ALK PHOS: 95 U/L (ref 38–126)
ALT: 31 U/L (ref 14–54)
ANION GAP: 9 (ref 5–15)
AST: 16 U/L (ref 15–41)
BILIRUBIN TOTAL: 0.6 mg/dL (ref 0.3–1.2)
BUN: 23 mg/dL — ABNORMAL HIGH (ref 6–20)
CALCIUM: 9.4 mg/dL (ref 8.9–10.3)
CO2: 22 mmol/L (ref 22–32)
CREATININE: 0.99 mg/dL (ref 0.44–1.00)
Chloride: 109 mmol/L (ref 101–111)
GFR calc non Af Amer: >60 mL/min (ref >60)
GLUCOSE: 114 mg/dL — AB (ref 65–99)
Potassium: 3.9 mmol/L (ref 3.5–5.1)
SODIUM: 140 mmol/L (ref 135–145)
TOTAL PROTEIN: 7.8 g/dL (ref 6.5–8.1)

## 2015-04-22 LAB — DIFFERENTIAL
Basophils Absolute: 0 10*3/uL (ref 0.0–0.1)
Basophils Relative: 0 % (ref 0–1)
EOS PCT: 0 % (ref 0–5)
Eosinophils Absolute: 0 10*3/uL (ref 0.0–0.7)
LYMPHS ABS: 3.1 10*3/uL (ref 0.7–4.0)
LYMPHS PCT: 26 % (ref 12–46)
MONO ABS: 0.9 10*3/uL (ref 0.1–1.0)
Monocytes Relative: 7 % (ref 3–12)
NEUTROS ABS: 8 10*3/uL — AB (ref 1.7–7.7)
Neutrophils Relative %: 67 % (ref 43–77)

## 2015-04-22 LAB — I-STAT TROPONIN, ED: Troponin i, poc: 0 ng/mL (ref 0.00–0.08)

## 2015-04-22 LAB — PROTIME-INR
INR: 1.07 (ref 0.00–1.49)
PROTHROMBIN TIME: 14.1 s (ref 11.6–15.2)

## 2015-04-22 LAB — ETHANOL: Alcohol, Ethyl (B): <5 mg/dL (ref <5)

## 2015-04-22 LAB — URINE MICROSCOPIC-ADD ON

## 2015-04-22 LAB — APTT: aPTT: 31 seconds (ref 24–37)

## 2015-04-22 NOTE — H&P (Signed)
[facility] Admission History and Physical       [name] [name] ZOX:[id number] DOB: Apr 08, 1966 DOA: 04/22/2015  Referring physician: EDP PCP: [name] Pink, MD  Specialists:   Chief Complaint: Right Sided Numbness and Weakness  HPI: [name] [name] is a 49 y.o. 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   Her symptoms have not changed.   She reports having a headache, but reports that she has had daily headaches since her head injury in 2015.   She denies any visual changes or facial weakness.  She reports having a lumbar epidural injection 1 week ago for lumbar pain.   Review of Systems:  Constitutional: No Weight Loss, No Weight Gain, Night Sweats, Fevers, Chills, Dizziness, Light Headedness, Fatigue, or Generalized Weakness HEENT: +Headaches, Difficulty Swallowing,Tooth/Dental Problems,Sore Throat,  No Sneezing, Rhinitis, Ear Ache, Nasal Congestion, or Post Nasal Drip,  Cardio-vascular:  No Chest pain, Orthopnea, PND, Edema in Lower Extremities, Anasarca, Dizziness, Palpitations  Resp: No Dyspnea, No DOE, No Productive Cough, No Non-Productive Cough, No Hemoptysis, No Wheezing.    GI: No Heartburn, Indigestion, Abdominal Pain, Nausea, Vomiting, Diarrhea, Constipation, Hematemesis, Hematochezia, Melena, Change in Bowel Habits,  Loss of Appetite  GU: No Dysuria, No Change in Color of Urine, No Urgency or Urinary Frequency, No Flank pain.  Musculoskeletal: No Joint Pain or Swelling, No Decreased Range of Motion, No Back Pain.  Neurologic: No Syncope, No Seizures, Muscle Weakness, +Paresthesia Right Side of Body, Vision Disturbance or Loss, No Diplopia, No Vertigo, No Difficulty Walking,  Skin: No Rash or Lesions. Psych: No Change in Mood or Affect, No Depression or Anxiety, No Memory loss,  No Confusion, or Hallucinations   Past Medical History  Diagnosis Date  . Migraine   . Seizures   . Asthma   . Obesity, Class III, BMI 40-49.9 (morbid obesity) 12/21/2012  . Shortness of breath dyspnea   . Anginal pain   . Anxiety   . Dysrhythmia     atrial fibrillation history  . Atrial fibrillation      Past Surgical History  Procedure Laterality Date  . Abdominal hysterectomy    . Cesarean section    . Esophagogastroduodenoscopy N/A 11/21/2012    Procedure: ESOPHAGOGASTRODUODENOSCOPY (EGD);  Surgeon: [name] Shipper, MD;  Location: [facility];  Service: Endoscopy;  Laterality: N/A;  . Colonoscopy N/A 11/21/2012    Procedure: COLONOSCOPY;  Surgeon: [name] Shipper, MD;  Location: [facility];  Service: Endoscopy;  Laterality: N/A;  . Dilation and curettage of uterus    . Hernia repair  12/17/12    laparoscopic incisional   . Incisional hernia repair N/A 12/17/2012    Procedure: LAPAROSCOPIC INCISIONAL HERNIA;  Surgeon: [name] Bowie, MD;  Location: [employer] [facility];  Service: General;  Laterality: N/A;  . Insertion of mesh N/A 12/17/2012    Procedure: INSERTION OF MESH;  Surgeon: [name] Bowie, MD;  Location: [place];  Service: General;  Laterality: N/A;  . Cardiac catheterization N/A 01/21/2015    Procedure: Left Heart Cath;  Surgeon: [name] Kida, MD;  Location: [facility];  Service: Cardiovascular;  Laterality: N/A;      Prior to Admission medications   Medication Sig Start Date End Date Taking? Authorizing Provider  albuterol (PROVENTIL HFA;VENTOLIN HFA) 108 (90 BASE) MCG/ACT inhaler Inhale 2 puffs into  the lungs every 6 (six) hours as needed for wheezing.   Yes Historical Provider, MD  clonazePAM (KLONOPIN) 1 MG tablet Take 1 mg by mouth at bedtime as needed (for sleep and if she feels a seizure coming on).   Yes Historical Provider, MD  lamoTRIgine (LAMICTAL) 100 MG tablet Take 1 tablet (100 mg total) by mouth 2 (two) times daily. 06/22/14  Yes [name] Givens, NP    omeprazole (PRILOSEC) 20 MG capsule Take 20 mg by mouth daily.   Yes Historical Provider, MD  topiramate (TOPAMAX) 100 MG tablet Take 1 tablet (100 mg total) by mouth 2 (two) times daily. 02/02/15  Yes [name] Partridge Dohmeier, MD  metoprolol tartrate (LOPRESSOR) 25 MG tablet Take 1 tablet by mouth 2 (two) times daily. 09/28/14   Historical Provider, MD  nortriptyline (PAMELOR) 10 MG capsule Take 3 capsules (30 mg total) by mouth at bedtime. Patient not taking: Reported on 04/15/2015 08/17/14   [name] Givens, NP  ondansetron (ZOFRAN) 4 MG tablet Take 1 tablet (4 mg total) by mouth every 6 (six) hours. Patient not taking: Reported on 03/02/2015 09/05/14   [name] Ricks Pisciotta, PA-C  oseltamivir (TAMIFLU) 75 MG capsule Take 1 capsule (75 mg total) by mouth every 12 (twelve) hours. Patient not taking: Reported on 10/01/2014 09/05/14   [name] Ricks Pisciotta, PA-C  Vitamin D, Ergocalciferol, (DRISDOL) 50000 UNITS CAPS capsule Take 1 capsule by mouth once a week. Every tuesday 09/07/14   Historical Provider, MD     Allergies  Allergen Reactions  . Amoxicillin Hives and Swelling  . Biaxin [Clarithromycin] Hives  . Erythromycin Base Hives  . Penicillins Hives and Swelling  . Zithromax [Azithromycin] Hives and Nausea Only    Social History:  reports that she has never smoked. She has never used smokeless tobacco. She reports that she drinks about 0.6 oz of alcohol per week. She reports that she does not use illicit drugs.    Family History  Problem Relation Age of Onset  . Hypertension Mother   . Depression Mother   . Heart attack Father   . Hypertension Father   . Emphysema Father   . COPD Father   . Hypertension Sister        Physical Exam:  GEN:  Pleasant Obese 49 y.o. African American female examined and in no acute distress; cooperative with exam Filed Vitals:   04/22/15 2100 04/22/15 2141 04/22/15 2200 04/22/15 2230  BP: 136/83 119/77 130/83 151/94  Pulse: 55 51 60 58  Temp:      TempSrc:      Resp:   15 18 14   SpO2: 98% 99% 99% 99%   Blood pressure 151/94, pulse 58, temperature 98.6 F (37 C), temperature source Oral, resp. rate 14, SpO2 99 %. PSYCH: She is alert and oriented x4; does not appear anxious does not appear depressed; affect is normal HEENT: Normocephalic and Atraumatic, Mucous membranes [name]; PERRLA; EOM intact; Fundi:  Benign;  No scleral icterus, Nares: Patent, Oropharynx: Clear, Fair Dentition,    Neck:  FROM, No Cervical Lymphadenopathy nor Thyromegaly or Carotid Bruit; No JVD; Breasts:: Not examined CHEST WALL: No tenderness CHEST: Normal respiration, clear to auscultation bilaterally HEART: Regular rate and rhythm; no murmurs rubs or gallops BACK: No kyphosis or scoliosis; No CVA tenderness ABDOMEN: Positive Bowel Sounds, Scaphoid, Obese, Soft Non-Tender, No Rebound or Guarding; No Masses, No Organomegaly, No Pannus; No Intertriginous candida. Rectal Exam: Not done EXTREMITIES: No Cyanosis, Clubbing, or Edema; No Ulcerations. Genitalia: not examined PULSES: 2+ and symmetric  SKIN: Normal hydration no rash or ulceration  CNS:  Alert and Oriented x 4, No Focal Deficits  Mental Status:  Alert, Oriented, Thought Content Appropriate. Speech Fluent without evidence of Aphasia. Able to follow 3 step commands without difficulty.  In No obvious pain.    Cranial Nerves:  II: Discs flat bilaterally; Visual fields Intact, Pupils equal and reactive.     III,IV, VI: Extra-ocular motions intact bilaterally     V,VII: smile symmetric, facial light touch sensation normal bilaterally     VIII: hearing intact bilaterally     IX,X: gag reflex present     XI: bilateral shoulder shrug     XII: midline tongue extension    Motor:  Right:  Upper extremity 4/5     Left:  Upper extremity 5/5      Right:  Lower extremity 5 Minus/5    Left:  Lower extremity 5/5      Tone and Bulk:  normal tone throughout; no atrophy noted    Sensory:  Pinprick and light touch intact throughout,  bilaterally    Deep Tendon Reflexes: 2+ and symmetric throughout    Plantars/ Babinski:  Right: normal Left: normal     Cerebellar:  Finger to nose without difficulty.    Gait: deferred    Vascular: pulses palpable throughout    Labs on Admission:  Basic Metabolic Panel:  Recent Labs Lab 04/22/15 2017  NA 140  K 3.9  CL 109  CO2 22  GLUCOSE 114*  BUN 23*  CREATININE 0.99  CALCIUM 9.4   Liver Function Tests:  Recent Labs Lab 04/22/15 2017  AST 16  ALT 31  ALKPHOS 95  BILITOT 0.6  PROT 7.8  ALBUMIN 4.2   No results for input(s): LIPASE, AMYLASE in the last 168 hours. No results for input(s): AMMONIA in the last 168 hours. CBC:  Recent Labs Lab 04/22/15 2017  WBC 12.0*  NEUTROABS 8.0*  HGB 15.0  HCT 44.9  MCV 83.5  PLT 209   Cardiac Enzymes: No results for input(s): CKTOTAL, CKMB, CKMBINDEX, TROPONINI in the last 168 hours.  BNP (last 3 results)  Recent Labs  10/01/14 1448  BNP 30.9    ProBNP (last 3 results) No results for input(s): PROBNP in the last 8760 hours.  CBG: No results for input(s): GLUCAP in the last 168 hours.  Radiological Exams on Admission: Ct Head Wo Contrast  04/22/2015   CLINICAL DATA:  "Pt states that this morning around 315am when pt got up to use the bathroom noticed that her right side of her body was numb and weak like. Pt states that she just rubbed her leg thinking it would help. Pt states that she had an epidural injection last week and not for sure if could be from that. Pt states that her right foot feels heavy and her right grip is slightly weaker than her left grip. Pt states that around 830 this mornign she started having center chest pain that is pressure like. Pt states nausea but denies vomiting. "  Left side numbness.   Left arm feels weak  EXAM: CT HEAD WITHOUT CONTRAST  TECHNIQUE: Contiguous axial images were obtained from the base of the skull through the vertex without intravenous contrast.  COMPARISON:   Brain MRI, 03/29/2014.  Head CT, 08/07/2012.  FINDINGS: The ventricles are normal in size and configuration. There are no parenchymal masses or mass effect. There is no evidence of an infarct. There are no extra-axial masses or abnormal  fluid collections.  There is no intracranial hemorrhage.  Visualized sinuses and mastoid air cells are clear.  No skull  IMPRESSION: Normal unenhanced CT scan of the brain.   Electronically Signed   By: Lajean Manes M.D.   On: 04/22/2015 21:31     EKG: Independently reviewed. Normal Sinus Rhythm Rate = 82, Old AnteroSeptal Infarct Changes present    Assessment/Plan:   49 y.o. female with  Principal Problem:   1.    CVA (cerebral infarction)- CVA vs TIA, May be due to C-Spine Disease   CVA/TIA Workup   Telemetry Monitoring   NEuro Checks   MRI/MRA Brain   2D ECHO and Carotid US in AM   Check Fasting Lipids and HbA1C in AM   Neurology Consult     Active Problems:   2.    Paresthesia and 3.   Right sided weakness- See #1   MRI of C-Spine ordered       3.    Seizure disorder- stable   Continue Topamax, and Lamictal Rx      4.   Atrial fibrillation-   Continue Aspirin Rx   Continue Metoprolol   Not A Coumadin Candidate due to Seizure Disorder     5.  Chronic headaches- due to and Since TBI   On Topamax Rx     6.   Asthma- stable ,    On Albuterol Inhaler PRN at home      7.  GERD (gastroesophageal reflux disease)   Protonix while in Hospital   On Omeprazole at home     8.  DVT Prophylaxis   Lovenox          Code Status:     FULL CODE       Family Communication:   No Family Present    Disposition Plan:    Observation Status        Time spent:  63 Minutes      Theressa Millard [facility] Pager [phone]   If [place] Please Contact the Day Rounding Team MD for [facility]  If 7PM-7AM, Please Contact Night-Floor Coverage  [email or web address] Password [facility] 04/22/2015, 10:44 PM     ADDENDUM:   Patient was seen  and examined on 04/22/2015

## 2015-04-22 NOTE — ED Notes (Signed)
Pt states that this morning around 315am when pt got up to use the bathroom noticed that her right side of her body was numb and weak like. Pt states that she just rubbed her leg thinking it would help. Pt states that she had an epidural injection last week and not for sure if could be from that.  Pt states that her right foot feels heavy and her right grip is slightly weaker than her left grip. Pt states that around 830 this mornign she started having center chest pain that is pressure like. Pt states nausea but denies vomiting.

## 2015-04-22 NOTE — ED Provider Notes (Signed)
CSN: [id number]     Arrival date & time 04/22/15  1850 History   First MD Initiated Contact with Patient 04/22/15 1922     Chief Complaint  Patient presents with  . Numbness    rigth side of body  . Chest Pain     (Consider location/radiation/quality/duration/timing/severity/associated sxs/prior Treatment) HPI   [name] [name] is a 49 y.o. female Presents for evaluation of waxing and waning numb feeling in her right arm and right leg, which makes it difficult to walk quickly, at 3:15 AM when she awoke this morning. She also feels like she has weakness in her right leg. She is not sure when that started. She denies headache, neck pain, back pain, nausea, vomiting, fever or chills. She had lumbar epidural injection, last week,for chronic lower back pain. She is able to drive to work earlier today, and work full day, then came here after work for evaluation. She has had left leg numbness, related to her lower back problem but never had right leg numbness or the degree of numbness which she is having her right leg and right arm, today. She states that she had a traumatic brain injury, with intracranial bleeding, one year ago, on a larger slide at [employer]. She continues to follow-up with a neurologist, about that problem. There are no other known modifying factors.   Past Medical History  Diagnosis Date  . Migraine   . Seizures   . Asthma   . Obesity, Class III, BMI 40-49.9 (morbid obesity) 12/21/2012  . Shortness of breath dyspnea   . Anginal pain   . Anxiety   . Dysrhythmia     atrial fibrillation history  . Atrial fibrillation    Past Surgical History  Procedure Laterality Date  . Abdominal hysterectomy    . Cesarean section    . Esophagogastroduodenoscopy N/A 11/21/2012    Procedure: ESOPHAGOGASTRODUODENOSCOPY (EGD);  Surgeon: Irene Shipper, MD;  Location: [facility];  Service: Endoscopy;  Laterality: N/A;  . Colonoscopy N/A 11/21/2012    Procedure: COLONOSCOPY;  Surgeon: Irene Shipper, MD;  Location: [facility];  Service: Endoscopy;  Laterality: N/A;  . Dilation and curettage of uterus    . Hernia repair  12/17/12    laparoscopic incisional   . Incisional hernia repair N/A 12/17/2012    Procedure: LAPAROSCOPIC INCISIONAL HERNIA;  Surgeon: Harl Bowie, MD;  Location: [place];  Service: General;  Laterality: N/A;  . Insertion of mesh N/A 12/17/2012    Procedure: INSERTION OF MESH;  Surgeon: Harl Bowie, MD;  Location: [place];  Service: General;  Laterality: N/A;  . Cardiac catheterization N/A 01/21/2015    Procedure: Left Heart Cath;  Surgeon: Yolonda Kida, MD;  Location: [place] [facility];  Service: Cardiovascular;  Laterality: N/A;   Family History  Problem Relation Age of Onset  . Hypertension Mother   . Depression Mother   . Heart attack Father   . Hypertension Father   . Emphysema Father   . COPD Father   . Hypertension Sister    Social History  Substance Use Topics  . Smoking status: Never Smoker   . Smokeless tobacco: Never Used  . Alcohol Use: 0.6 oz/week    1 Glasses of wine per week     Comment: occ glass of wine   OB History    No data available     Review of Systems  All other systems reviewed and are negative.     Allergies  Amoxicillin; Biaxin; Erythromycin base; Penicillins; and Zithromax  Home Medications   Prior to Admission medications   Medication Sig Start Date End Date Taking? Authorizing Provider  albuterol (PROVENTIL HFA;VENTOLIN HFA) 108 (90 BASE) MCG/ACT inhaler Inhale 2 puffs into the lungs every 6 (six) hours as needed for wheezing.   Yes Historical Provider, MD  clonazePAM (KLONOPIN) 1 MG tablet Take 1 mg by mouth at bedtime as needed (for sleep and if she feels a seizure coming on).   Yes Historical Provider, MD  lamoTRIgine (LAMICTAL) 100 MG tablet Take 1 tablet (100 mg total) by mouth 2 (two) times daily. 06/22/14  Yes Ward Givens, NP  omeprazole (PRILOSEC) 20 MG capsule Take 20 mg by mouth  daily.   Yes Historical Provider, MD  topiramate (TOPAMAX) 100 MG tablet Take 1 tablet (100 mg total) by mouth 2 (two) times daily. 02/02/15  Yes Asencion Partridge Dohmeier, MD  metoprolol tartrate (LOPRESSOR) 25 MG tablet Take 1 tablet by mouth 2 (two) times daily. 09/28/14   Historical Provider, MD  nortriptyline (PAMELOR) 10 MG capsule Take 3 capsules (30 mg total) by mouth at bedtime. Patient not taking: Reported on 04/15/2015 08/17/14   Ward Givens, NP  ondansetron (ZOFRAN) 4 MG tablet Take 1 tablet (4 mg total) by mouth every 6 (six) hours. Patient not taking: Reported on 03/02/2015 09/05/14   Elmyra Ricks Pisciotta, PA-C  oseltamivir (TAMIFLU) 75 MG capsule Take 1 capsule (75 mg total) by mouth every 12 (twelve) hours. Patient not taking: Reported on 10/01/2014 09/05/14   Elmyra Ricks Pisciotta, PA-C  Vitamin D, Ergocalciferol, (DRISDOL) 50000 UNITS CAPS capsule Take 1 capsule by mouth once a week. Every tuesday 09/07/14   Historical Provider, MD   BP 119/77 mmHg  Pulse 51  Temp(Src) 98.6 F (37 C) (Oral)  Resp 15  SpO2 99% Physical Exam  Constitutional: She is oriented to person, place, and time. She appears well-developed and well-nourished.  obese  HENT:  Head: Normocephalic and atraumatic.  Right Ear: External ear normal.  Left Ear: External ear normal.  Eyes: Conjunctivae and EOM are normal. Pupils are equal, round, and reactive to light.  Neck: Normal range of motion and phonation normal. Neck supple.  Cardiovascular: Normal rate, regular rhythm and normal heart sounds.   Pulmonary/Chest: Effort normal and breath sounds normal. She exhibits no bony tenderness.  Abdominal: Soft. There is no tenderness.  Musculoskeletal: Normal range of motion.  Neurological: She is alert and oriented to person, place, and time. No cranial nerve deficit or sensory deficit. She exhibits normal muscle tone. Coordination normal.  No dysarthria and aphasia or nystagmus. No ataxia or pronator drift. Very minimal decreased right  hand grip strength. Normal strength of legs, bilaterally. No altered light touch sensation of the hands or feet bilaterally.  Skin: Skin is warm, dry and intact.  Psychiatric: She has a normal mood and affect. Her behavior is normal. Judgment and thought content normal.  Nursing note and vitals reviewed.   ED Course  Procedures (including critical care time)  Record review from [facility] neurology, indicates prior difficulty walking and right sided weakness, and reported history of TBI, and intracranial bleeding. There is no official documentation for these latter problems. Injury occurred in [place]. He is apparently in the process of litigation for it.   Medications - No data to display  Patient Vitals for the past 24 hrs:  BP Temp Temp src Pulse Resp SpO2  04/22/15 2141 119/77 mmHg - - (!) 51 15 99 %  04/22/15 2100  136/83 mmHg - - (!) 55 - 98 %  04/22/15 2030 128/85 mmHg - - 63 - 99 %  04/22/15 2024 136/84 mmHg - - 65 16 100 %  04/22/15 2000 136/84 mmHg - - 69 17 98 %  04/22/15 1932 142/86 mmHg - - 81 11 97 %  04/22/15 1906 148/97 mmHg 98.6 F (37 C) Oral 85 16 99 %    9:50 PM Reevaluation with update and discussion. After initial assessment and treatment, an updated evaluation rev. She states the numbness is "better". She states that it has continued to wax and wane during the ER stay. There is no change in her clinical status. She continues to be low to move the right leg easily and there is no facial numbness. She denies neck pain, headache or back pain at this time. She states that she cannot tolerate an MRI, without sedation. There is no way to obtain a sedation MRI, at this time, and no open MRI is available. Roda Lauture L   9:51 PM-Consult complete with Dr. Arnoldo Morale. Patient case explained and discussed. She agrees to admit patient for further evaluation and treatment. Call ended at 22:08  Labs Review Labs Reviewed  CBC - Abnormal; Notable for the following:    WBC 12.0 (*)     RBC 5.38 (*)    All other components within normal limits  DIFFERENTIAL - Abnormal; Notable for the following:    Neutro Abs 8.0 (*)    All other components within normal limits  COMPREHENSIVE METABOLIC PANEL - Abnormal; Notable for the following:    Glucose, Bld 114 (*)    BUN 23 (*)    All other components within normal limits  URINALYSIS, ROUTINE W REFLEX MICROSCOPIC (NOT AT [facility]) - Abnormal; Notable for the following:    Color, Urine AMBER (*)    APPearance CLOUDY (*)    Hgb urine dipstick TRACE (*)    All other components within normal limits  ETHANOL  URINE RAPID DRUG SCREEN, HOSP PERFORMED  PROTIME-INR  APTT  URINE MICROSCOPIC-ADD ON  I-STAT TROPOININ, ED    Imaging Review Ct Head Wo Contrast  04/22/2015   CLINICAL DATA:  "Pt states that this morning around 315am when pt got up to use the bathroom noticed that her right side of her body was numb and weak like. Pt states that she just rubbed her leg thinking it would help. Pt states that she had an epidural injection last week and not for sure if could be from that. Pt states that her right foot feels heavy and her right grip is slightly weaker than her left grip. Pt states that around 830 this mornign she started having center chest pain that is pressure like. Pt states nausea but denies vomiting. "  Left side numbness.   Left arm feels weak  EXAM: CT HEAD WITHOUT CONTRAST  TECHNIQUE: Contiguous axial images were obtained from the base of the skull through the vertex without intravenous contrast.  COMPARISON:  Brain MRI, 03/29/2014.  Head CT, 08/07/2012.  FINDINGS: The ventricles are normal in size and configuration. There are no parenchymal masses or mass effect. There is no evidence of an infarct. There are no extra-axial masses or abnormal fluid collections.  There is no intracranial hemorrhage.  Visualized sinuses and mastoid air cells are clear.  No skull  IMPRESSION: Normal unenhanced CT scan of the brain.   Electronically  Signed   By: Lajean Manes M.D.   On: 04/22/2015 21:31   I  have personally reviewed and evaluated these images and lab results as part of my medical decision-making.   EKG Interpretation   Date/Time:  Friday April 22 2015 18:57:00 EDT Ventricular Rate:  82 PR Interval:  154 QRS Duration: 82 QT Interval:  365 QTC Calculation: 426 R Axis:   53 Text Interpretation:  Sinus rhythm Probable anteroseptal infarct, old No  significant change since last tracing Confirmed by POLLINA  MD,  CHRISTOPHER [phone]) on 04/22/2015 7:15:07 PM      MDM   Final diagnoses:  Paresthesia    Nonspecific paresthesia right arm and right leg. No associated headache. History of traumatic brain injury and prior weakness in right leg. Possible stroke, although it is unlikely. Possible cervical spine disease. However, she does not have neck pain at this time, making this an unlikely source for her clinical findings. She has not had recent MR imaging done. Patient will benefit from admission for observation, and MRI, done after sedation, tomorrow.  Nursing Notes Reviewed/ Care Coordinated, and agree without changes. Applicable Imaging Reviewed.  Interpretation of Laboratory Data incorporated into ED treatment  Plan: Admit    Daleen Bo, MD 04/25/15 [phone]

## 2015-04-22 NOTE — ED Notes (Signed)
Bed: NG87 Expected date:  Expected time:  Means of arrival:  Comments: Tri 1

## 2015-04-23 ENCOUNTER — Observation Stay (HOSPITAL_COMMUNITY): Payer: BC Managed Care – PPO

## 2015-04-23 ENCOUNTER — Encounter (HOSPITAL_COMMUNITY): Payer: Self-pay | Admitting: *Deleted

## 2015-04-23 ENCOUNTER — Observation Stay (HOSPITAL_BASED_OUTPATIENT_CLINIC_OR_DEPARTMENT_OTHER): Payer: BC Managed Care – PPO

## 2015-04-23 DIAGNOSIS — M6289 Other specified disorders of muscle: Secondary | ICD-10-CM | POA: Diagnosis not present

## 2015-04-23 DIAGNOSIS — I639 Cerebral infarction, unspecified: Secondary | ICD-10-CM | POA: Diagnosis not present

## 2015-04-23 DIAGNOSIS — G40909 Epilepsy, unspecified, not intractable, without status epilepticus: Secondary | ICD-10-CM | POA: Diagnosis not present

## 2015-04-23 DIAGNOSIS — R51 Headache: Secondary | ICD-10-CM

## 2015-04-23 DIAGNOSIS — I480 Paroxysmal atrial fibrillation: Secondary | ICD-10-CM | POA: Diagnosis not present

## 2015-04-23 DIAGNOSIS — K219 Gastro-esophageal reflux disease without esophagitis: Secondary | ICD-10-CM | POA: Diagnosis not present

## 2015-04-23 LAB — TROPONIN I
Troponin I: <0.03 ng/mL (ref <0.031)
Troponin I: <0.03 ng/mL (ref <0.031)

## 2015-04-23 LAB — LIPID PANEL
CHOL/HDL RATIO: 3.4 ratio
Cholesterol: 145 mg/dL (ref 0–200)
HDL: 43 mg/dL (ref >40)
LDL Cholesterol: 91 mg/dL (ref 0–99)
Triglycerides: 53 mg/dL (ref <150)
VLDL: 11 mg/dL (ref 0–40)

## 2015-04-23 MED ORDER — PANTOPRAZOLE SODIUM 40 MG PO TBEC
40.0000 mg | DELAYED_RELEASE_TABLET | Freq: Every day | ORAL | Status: DC
  Administered 2015-04-23 – 2015-04-25 (×3): 40 mg via ORAL
  Filled 2015-04-23 (×3): qty 1

## 2015-04-23 MED ORDER — DIAZEPAM 5 MG PO TABS
5.0000 mg | ORAL_TABLET | Freq: Once | ORAL | Status: AC
  Administered 2015-04-23: 5 mg via ORAL
  Filled 2015-04-23: qty 1

## 2015-04-23 MED ORDER — LAMOTRIGINE 100 MG PO TABS
100.0000 mg | ORAL_TABLET | Freq: Two times a day (BID) | ORAL | Status: DC
  Administered 2015-04-23 – 2015-04-25 (×5): 100 mg via ORAL
  Filled 2015-04-23 (×5): qty 1

## 2015-04-23 MED ORDER — HYDROMORPHONE HCL 1 MG/ML IJ SOLN
0.5000 mg | INTRAMUSCULAR | Status: DC | PRN
  Administered 2015-04-23 – 2015-04-24 (×4): 0.5 mg via INTRAVENOUS
  Filled 2015-04-23 (×4): qty 1

## 2015-04-23 MED ORDER — SODIUM CHLORIDE 0.9 % IV SOLN: INTRAVENOUS | Status: DC

## 2015-04-23 MED ORDER — STROKE: EARLY STAGES OF RECOVERY BOOK
Freq: Once | Status: AC
  Administered 2015-04-23: 02:00:00
  Filled 2015-04-23: qty 1

## 2015-04-23 MED ORDER — INFLUENZA VAC SPLIT QUAD 0.5 ML IM SUSY
0.5000 mL | PREFILLED_SYRINGE | INTRAMUSCULAR | Status: AC
  Administered 2015-04-24: 0.5 mL via INTRAMUSCULAR
  Filled 2015-04-23: qty 0.5

## 2015-04-23 MED ORDER — PNEUMOCOCCAL VAC POLYVALENT 25 MCG/0.5ML IJ INJ
0.5000 mL | INJECTION | INTRAMUSCULAR | Status: AC
  Administered 2015-04-24: 0.5 mL via INTRAMUSCULAR
  Filled 2015-04-23: qty 0.5

## 2015-04-23 MED ORDER — LORAZEPAM 2 MG/ML IJ SOLN
0.5000 mg | Freq: Once | INTRAMUSCULAR | Status: AC
  Administered 2015-04-23: 0.5 mg via INTRAVENOUS

## 2015-04-23 MED ORDER — ASPIRIN 325 MG PO TABS
325.0000 mg | ORAL_TABLET | Freq: Every day | ORAL | Status: DC
  Administered 2015-04-23 – 2015-04-24 (×2): 325 mg via ORAL
  Filled 2015-04-23 (×2): qty 1

## 2015-04-23 MED ORDER — ASPIRIN 300 MG RE SUPP
300.0000 mg | Freq: Every day | RECTAL | Status: DC
Start: 2015-04-23 — End: 2015-04-24

## 2015-04-23 MED ORDER — METOPROLOL TARTRATE 25 MG PO TABS
25.0000 mg | ORAL_TABLET | Freq: Two times a day (BID) | ORAL | Status: DC
  Administered 2015-04-23 – 2015-04-25 (×4): 25 mg via ORAL
  Filled 2015-04-23 (×5): qty 1

## 2015-04-23 MED ORDER — TOPIRAMATE 100 MG PO TABS
100.0000 mg | ORAL_TABLET | Freq: Two times a day (BID) | ORAL | Status: DC
  Administered 2015-04-23 – 2015-04-25 (×5): 100 mg via ORAL
  Filled 2015-04-23 (×6): qty 1

## 2015-04-23 MED ORDER — ENOXAPARIN SODIUM 40 MG/0.4ML ~~LOC~~ SOLN
40.0000 mg | SUBCUTANEOUS | Status: DC
  Administered 2015-04-23 – 2015-04-24 (×2): 40 mg via SUBCUTANEOUS
  Filled 2015-04-23 (×2): qty 0.4

## 2015-04-23 MED ORDER — SENNOSIDES-DOCUSATE SODIUM 8.6-50 MG PO TABS: 1.0000 | ORAL_TABLET | Freq: Every evening | ORAL | Status: DC | PRN

## 2015-04-23 MED ORDER — LORAZEPAM 2 MG/ML IJ SOLN
INTRAMUSCULAR | Status: AC
  Filled 2015-04-23: qty 1

## 2015-04-23 MED ORDER — CLONAZEPAM 1 MG PO TABS
1.0000 mg | ORAL_TABLET | Freq: Every evening | ORAL | Status: DC | PRN
  Administered 2015-04-24 (×2): 1 mg via ORAL
  Filled 2015-04-23 (×2): qty 1

## 2015-04-23 NOTE — Progress Notes (Signed)
*  PRELIMINARY RESULTS* Vascular Ultrasound Carotid Duplex (Doppler) has been completed.  Preliminary findings: Bilateral:  1-39% ICA stenosis.  Vertebral artery flow is antegrade.      Landry Mellow, RDMS, RVT  04/23/2015, 1:31 PM

## 2015-04-23 NOTE — Consult Note (Signed)
Stroke Consult    Chief Complaint: right sided weakness and numbness  HPI: [name] [name] is an 49 y.o. female hx of TBI with SAH in 2015, seizures, chronic headache, A fib (not on anticoagulation) presenting with right sided motor and sensory deficits. LSW 2100 on 8/26. Awoke at 0315 today and noted weakness and numbness on her right side. Denies any speech or visual deficits. Reports chronic headache since TBI in 2015.   CT head imaging reviewed and overall unremarkable.   Date last known well: 8/26 Time last known well: 2100 tPA Given: no, outside tPA window Modified Rankin: Rankin Score=0  Past Medical History  Diagnosis Date  . Migraine   . Seizures   . Asthma   . Obesity, Class III, BMI 40-49.9 (morbid obesity) 12/21/2012  . Shortness of breath dyspnea   . Anginal pain   . Anxiety   . Dysrhythmia     atrial fibrillation history  . Atrial fibrillation     Past Surgical History  Procedure Laterality Date  . Abdominal hysterectomy    . Cesarean section    . Esophagogastroduodenoscopy N/A 11/21/2012    Procedure: ESOPHAGOGASTRODUODENOSCOPY (EGD);  Surgeon: Irene Shipper, MD;  Location: [facility];  Service: Endoscopy;  Laterality: N/A;  . Colonoscopy N/A 11/21/2012    Procedure: COLONOSCOPY;  Surgeon: Irene Shipper, MD;  Location[facility];  Service: Endoscopy;  Laterality: N/A;  . Dilation and curettage of uterus    . Hernia repair  12/17/12    laparoscopic incisional   . Incisional hernia repair N/A 12/17/2012    Procedure: LAPAROSCOPIC INCISIONAL HERNIA;  Surgeon: Harl Bowie, MD;  Location: [place];  Service: General;  Laterality: N/A;  . Insertion of mesh N/A 12/17/2012    Procedure: INSERTION OF MESH;  Surgeon: Harl Bowie, MD;  Location: [place];  Service: General;  Laterality: N/A;  . Cardiac catheterization N/A 01/21/2015    Procedure: Left Heart Cath;  Surgeon: Yolonda Kida, MD;  Location: [place] [facility];  Service: Cardiovascular;   Laterality: N/A;    Family History  Problem Relation Age of Onset  . Hypertension Mother   . Depression Mother   . Heart attack Father   . Hypertension Father   . Emphysema Father   . COPD Father   . Hypertension Sister    Social History:  reports that she has never smoked. She has never used smokeless tobacco. She reports that she drinks about 0.6 oz of alcohol per week. She reports that she does not use illicit drugs.  Allergies:  Allergies  Allergen Reactions  . Amoxicillin Hives and Swelling  . Biaxin [Clarithromycin] Hives  . Erythromycin Base Hives  . Penicillins Hives and Swelling  . Zithromax [Azithromycin] Hives and Nausea Only     (Not in a hospital admission)  ROS: Out of a complete 14 system review, the patient complains of only the following symptoms, and all other reviewed systems are negative. +weakness and numbness  Physical Examination: Filed Vitals:   04/23/15 0000  BP: 145/80  Pulse: 45  Temp:   Resp: 17   Physical Exam  Constitutional: He appears well-developed and well-nourished.  Psych: Affect appropriate to situation Eyes: No scleral injection HENT: No OP obstrucion Head: Normocephalic.  Cardiovascular: Normal rate and regular rhythm.  Respiratory: Effort normal and breath sounds normal.  GI: Soft. Bowel sounds are normal. No distension. There is no tenderness.  Skin: WDI   Neurologic Examination: Mental Status: Alert, oriented, thought  content appropriate.  Speech fluent without evidence of aphasia.  Able to follow 3 step commands without difficulty. Cranial Nerves: II: funduscopic exam wnl bilaterally, visual fields grossly normal, pupils equal, round, reactive to light and accommodation III,IV, VI: ptosis not present, extra-ocular motions intact bilaterally V,VII: smile symmetric, facial light touch sensation normal bilaterally VIII: hearing normal bilaterally IX,X: gag reflex present XI: trapezius strength/neck flexion  strength normal bilaterally XII: tongue strength normal  Motor: Right : Upper extremity    Left:     Upper extremity 4+/5 deltoid       5/5 deltoid 5-/5 biceps      5/5 biceps  5-/5 triceps      5/5 triceps 5-/5 hand grip      5/5 hand grip  Lower extremity     Lower extremity 4+/5 hip flexor      5/5 hip flexor 4+/5 quadricep      5/5 quadriceps  5-/5 hamstrings     5/5 hamstrings 5-/5 plantar flexion       5/5 plantar flexion 5-/5 plantar extension     5/5 plantar extension Tone and bulk:normal tone throughout; no atrophy noted Sensory: Pinprick and light touch decreased on right side Deep Tendon Reflexes: 2+ and symmetric throughout Plantars: Right: downgoing   Left: downgoing Cerebellar: normal finger-to-nose, and normal heel-to-shin test Gait: deferred  Laboratory Studies:   Basic Metabolic Panel:  Recent Labs Lab 04/22/15 2017  NA 140  K 3.9  CL 109  CO2 22  GLUCOSE 114*  BUN 23*  CREATININE 0.99  CALCIUM 9.4    Liver Function Tests:  Recent Labs Lab 04/22/15 2017  AST 16  ALT 31  ALKPHOS 95  BILITOT 0.6  PROT 7.8  ALBUMIN 4.2   No results for input(s): LIPASE, AMYLASE in the last 168 hours. No results for input(s): AMMONIA in the last 168 hours.  CBC:  Recent Labs Lab 04/22/15 2017  WBC 12.0*  NEUTROABS 8.0*  HGB 15.0  HCT 44.9  MCV 83.5  PLT 209    Cardiac Enzymes: No results for input(s): CKTOTAL, CKMB, CKMBINDEX, TROPONINI in the last 168 hours.  BNP: Invalid input(s): POCBNP  CBG: No results for input(s): GLUCAP in the last 168 hours.  Microbiology: Results for orders placed or performed during the hospital encounter of 09/05/14  Urine culture     Status: None   Collection Time: 09/05/14 12:21 PM  Result Value Ref Range Status   Specimen Description URINE, CLEAN CATCH  Final   Special Requests NONE  Final   Colony Count   Final    20,OOO COLONIES/ML Performed at [facility]    Culture   Final    Multiple  bacterial morphotypes present, none predominant. Suggest appropriate recollection if clinically indicated. Performed at [facility]    Report Status 09/07/2014 FINAL  Final  Blood culture (routine x 2)     Status: None   Collection Time: 09/05/14 12:30 PM  Result Value Ref Range Status   Specimen Description BLOOD LEFT ARM  Final   Special Requests BOTTLES DRAWN AEROBIC AND ANAEROBIC 5CC EACH  Final   Culture   Final    NO GROWTH 5 DAYS Performed at [facility]    Report Status 09/11/2014 FINAL  Final  Blood culture (routine x 2)     Status: None   Collection Time: 09/05/14  1:30 PM  Result Value Ref Range Status   Specimen Description BLOOD RIGHT ANTECUBITAL  Final  Special Requests BOTTLES DRAWN AEROBIC AND ANAEROBIC 5ML  Final   Culture   Final    NO GROWTH 5 DAYS Performed at [facility]    Report Status 09/11/2014 FINAL  Final    Coagulation Studies:  Recent Labs  04/22/15 2040  LABPROT 14.1  INR 1.07    Urinalysis:  Recent Labs Lab 04/22/15 2011  COLORURINE AMBER*  LABSPEC 1.029  PHURINE 5.5  GLUCOSEU NEGATIVE  HGBUR TRACE*  BILIRUBINUR NEGATIVE  KETONESUR NEGATIVE  PROTEINUR NEGATIVE  UROBILINOGEN 0.2  NITRITE NEGATIVE  LEUKOCYTESUR NEGATIVE    Lipid Panel:     Component Value Date/Time   CHOL 141 09/27/2014 0601   CHOL 140 06/22/2013 1152   TRIG 64 09/27/2014 0601   TRIG 72 05/23/2007 0910   HDL 44 09/27/2014 0601   HDL 48 06/22/2013 1152   CHOLHDL 4.4 05/23/2007 0910   VLDL 13 09/27/2014 0601   VLDL 14 05/23/2007 0910   LDLCALC 84 09/27/2014 0601   LDLCALC  05/23/2007 0910    98        Total Cholesterol/HDL:CHD Risk Coronary Heart Disease Risk Table                     Men   Women  1/2 Average Risk   3.4   3.3    HgbA1C:  Lab Results  Component Value Date   HGBA1C 5.9* 03/08/2015    Urine Drug Screen:     Component Value Date/Time   LABOPIA NONE DETECTED 04/22/2015 2010   COCAINSCRNUR NONE DETECTED  04/22/2015 2010   LABBENZ NONE DETECTED 04/22/2015 2010   AMPHETMU NONE DETECTED 04/22/2015 2010   THCU NONE DETECTED 04/22/2015 2010   LABBARB NONE DETECTED 04/22/2015 2010    Alcohol Level:  Recent Labs Lab 04/22/15 2017  ETH <5    Other results:  Imaging: Ct Head Wo Contrast  04/22/2015   CLINICAL DATA:  "Pt states that this morning around 315am when pt got up to use the bathroom noticed that her right side of her body was numb and weak like. Pt states that she just rubbed her leg thinking it would help. Pt states that she had an epidural injection last week and not for sure if could be from that. Pt states that her right foot feels heavy and her right grip is slightly weaker than her left grip. Pt states that around 830 this mornign she started having center chest pain that is pressure like. Pt states nausea but denies vomiting. "  Left side numbness.   Left arm feels weak  EXAM: CT HEAD WITHOUT CONTRAST  TECHNIQUE: Contiguous axial images were obtained from the base of the skull through the vertex without intravenous contrast.  COMPARISON:  Brain MRI, 03/29/2014.  Head CT, 08/07/2012.  FINDINGS: The ventricles are normal in size and configuration. There are no parenchymal masses or mass effect. There is no evidence of an infarct. There are no extra-axial masses or abnormal fluid collections.  There is no intracranial hemorrhage.  Visualized sinuses and mastoid air cells are clear.  No skull  IMPRESSION: Normal unenhanced CT scan of the brain.   Electronically Signed   By: Lajean Manes M.D.   On: 04/22/2015 21:31    Assessment: 49 y.o. female hx of TBI with resultant SAH, chronic headache, A fib (not on anticoagulation) presenting after waking up with right sided motor and sensory deficits. Admitted for stroke workup.    Plan: 1. HgbA1c, fasting lipid panel  2. MRI, MRA  of the brain without contrast 3. PT consult, OT consult, Speech consult 4. Echocardiogram 5. Carotid dopplers 6.  Prophylactic therapy-ASA 325mg  pending MRI results 7. Risk factor modification 8. Telemetry monitoring 9. Frequent neuro checks 10. NPO until RN stroke swallow screen    Jim Like, DO [facility] [phone]  If 7pm- 7am, please page neurology on call as listed in [place]. 04/23/2015, 12:19 AM

## 2015-04-23 NOTE — Progress Notes (Signed)
Nutrition Brief Note  Patient identified on the Malnutrition Screening Tool (MST) Report  Wt Readings from Last 15 Encounters:  04/23/15 264 lb 12.8 oz (120.112 kg)  04/15/15 268 lb (121.564 kg)  04/06/15 259 lb (117.482 kg)  03/11/15 278 lb (126.1 kg)  03/02/15 275 lb (124.739 kg)  02/02/15 281 lb (127.461 kg)  01/21/15 283 lb (128.368 kg)  09/05/14 275 lb (124.739 kg)  08/17/14 278 lb 3.2 oz (126.191 kg)  06/09/14 282 lb (127.914 kg)  03/09/14 275 lb (124.739 kg)  02/03/14 263 lb (119.296 kg)  06/22/13 270 lb (122.471 kg)  06/09/13 274 lb (124.286 kg)  05/13/13 275 lb (124.739 kg)    Body mass index is 46.92 kg/(m^2). Patient meets criteria for Morbid Obesity based on current BMI.   Current diet order is Heart Healthy, patient is consuming approximately 100% of meals at this time. Pt out of room at time of visit. Per weight history, pt has lost 5% of her body weight in the past 6 weeks- not severe for time frame. Labs and medications reviewed.   No nutrition interventions warranted at this time. If nutrition issues arise, please consult RD.   Scarlette Ar RD, LDN Inpatient Clinical Dietitian Pager: [phone] After Hours Pager: [phone]

## 2015-04-23 NOTE — Progress Notes (Signed)
STROKE TEAM PROGRESS NOTE  HPI [name] [name] is an 49 y.o. female hx of TBI with SAH in 2015, seizures, chronic headache, A fib (not on anticoagulation) presenting with right sided motor and sensory deficits. LSW 2100 on 8/26. Awoke at 0315 today and noted weakness and numbness on her right side. Denies any speech or visual deficits. Reports chronic headache since TBI in 2015.   CT head imaging reviewed and overall unremarkable.   Date last known well: 8/26 Time last known well: 2100 tPA Given: no, outside tPA window Modified Rankin: Rankin Score=0   SUBJECTIVE (INTERVAL HISTORY); She is improved today, weakness is better, still with numbness and tingling on the right side    OBJECTIVE Temp:  [98 F (36.7 C)-98.6 F (37 C)] 98.2 F (36.8 C) (08/27 0409) Pulse Rate:  [45-95] 89 (08/27 0550) Cardiac Rhythm:  [-] Sinus bradycardia (08/27 0158) Resp:  [11-19] 13 (08/27 0550) BP: (119-154)/(76-97) 154/76 mmHg (08/27 0550) SpO2:  [96 %-100 %] 97 % (08/27 0550) Weight:  [120.112 kg (264 lb 12.8 oz)] 120.112 kg (264 lb 12.8 oz) (08/27 0030)     Component Value Date/Time   CHOL 145 04/23/2015 0414   CHOL 141 09/27/2014 0601   TRIG 53 04/23/2015 0414   TRIG 64 09/27/2014 0601   HDL 43 04/23/2015 0414   HDL 44 09/27/2014 0601   CHOLHDL 3.4 04/23/2015 0414   VLDL 11 04/23/2015 0414   VLDL 13 09/27/2014 0601   LDLCALC 91 04/23/2015 0414   LDLCALC 84 09/27/2014 0601   Lab Results  Component Value Date   HGBA1C 5.9* 03/08/2015      Component Value Date/Time   LABOPIA NONE DETECTED 04/22/2015 2010   COCAINSCRNUR NONE DETECTED 04/22/2015 2010   LABBENZ NONE DETECTED 04/22/2015 2010   AMPHETMU NONE DETECTED 04/22/2015 2010   THCU NONE DETECTED 04/22/2015 2010   LABBARB NONE DETECTED 04/22/2015 2010     Basic Metabolic Panel:  Recent Labs Lab 04/22/15 2017  NA 140  K 3.9  CL 109  CO2 22  GLUCOSE 114*  BUN 23*  CREATININE 0.99  CALCIUM 9.4   CBC:  Recent Labs Lab  04/22/15 2017  WBC 12.0*  NEUTROABS 8.0*  HGB 15.0  HCT 44.9  MCV 83.5  PLT 209   Coagulation:  Recent Labs Lab 04/22/15 2040  LABPROT 14.1  INR 1.07    IMAGING  Ct Head Wo Contrast 04/22/2015    Normal unenhanced CT scan of the brain.       MRI of the Cervical Spine - pending    MRI / MRA Head / Brain - pending    PHYSICAL EXAM Physical exam: Exam: Gen: NAD Eyes: anicteric sclerae, moist conjunctivae                    CV: no MRG, no carotid bruits, no peripheral edema Mental Status: Alert, follows commands, good historian, oriented to person, place, date, situation  Neuro: Detailed Neurologic Exam  Speech:    No aphasia, no dysarthria  Cranial Nerves:    The pupils are equal, round, and reactive to light.. Attempted, Fundi not visualized due to small pupils.  Visual fields intact to confrontation. EOMI. No gaze preference. Visual fields full. Face symmetric and intact to LT, Tongue midline.uvula midline.  Hearing intact to voice. Shoulder shrug intact  Motor Observation:    no involuntary movements noted. Tone appears normal.     Strength:    Mild right-sided hemiparesis  Sensation: right hemisensory loss  DTRs: 2+ throughout  Plantars downgoing.   Coordination: No dysmetria  Gait: Patient able to ambulate normally down the hallway with PT    ASSESSMENT/PLAN [name] is a 49 y.o. female with history of TBI with SAH in 2015, seizures, chronic headache, A fib (not on anticoagulation) presenting with right-sided motor and sensory deficits. She did not receive IV t-PA due to late presentation and minimal deficits.  Stroke / TIA:  Dominant - possibly embolic secondary to atrial fibrillation without anticoagulation.  Resultant  Improving right hemiparesis  MRI  pending  MRA  pending  Carotid Doppler pending  2D Echo pending  LDL 91  HgbA1c pending  Lovenox for VTE prophylaxis  Diet Heart Room service appropriate?: Yes;  Fluid consistency:: Thin  no antithrombotic prior to admission, now on aspirin 325 mg orally every day. May suggest Eliquis for stroke prevention in this patient with a hx of new-onset afib in February when hospitalized at [facility] for afib with rvr  Patient counseled to be compliant with her antithrombotic medications  Ongoing aggressive stroke risk factor management  Therapy recommendations: Outpatient physical therapy - occupational therapy evaluation is pending.  Disposition: Pending  Hypertension  Stable Permissive hypertension (OK if < 220/120) but gradually normalize in 5-7 days  Hyperlipidemia  Home meds:  No lipid lowering medications prior to admission.  LDL 91 , goal < 70  Consider adding Lipitor 20 mg daily  Continue statin at discharge  Diabetes  HgbA1c pending, goal < 7.0  No history of diabetes  Other Stroke Risk Factors  ETOH use  Obesity, Body mass index is 46.92 kg/(m^2).   Migraines   Other Active Problems  Mild leukocytosis  This patients CHA2DS2-VASc Score and unadjusted Ischemic Stroke Rate (% per year) is equal to 2.2 % stroke rate/year from a score of 2  Above score calculated as 1 point each if present [CHF, HTN, DM, Vascular=MI/PAD/Aortic Plaque, Age if 65-74, or Female] Above score calculated as 2 points each if present [Age > 75, or Stroke/TIA/TE]   Hospital day #   [name] PA-C [facility] Pager [phone] 04/23/2015, 11:29 AM    Personally examined patient and images, and have participated in and made any corrections needed to history, physical, neuro exam,assessment and plan as stated above. 49 year old patient with afib who is not on anticoagulation who awoke in the middle of the night with right-sided weakness and sensory changes likely embolic from afib. Exam shows mild right-sided hemiparesis and hemisensory loss improved from admission.  May suggest Eliquis for stroke prevention in this patient with  a hx of new-onset afib in February when hospitalized at [facility] for afib with rvr.  Sarina Ill, MD Stroke Neurology [phone] [facility]       To contact Stroke Continuity provider, please refer to [email or web address] After hours, contact General Neurology

## 2015-04-23 NOTE — Progress Notes (Addendum)
[facility] PROGRESS NOTE  [name] [name] WGY:[id number] DOB: 1966-01-03 DOA: 04/22/2015 PCP: [name], MD  Assessment/Plan: R sided numbness -TIA vs CVA -MRI pending -ECHO and Carotid duplex pending -Neuro following -change ASA to ?NOAC -FU hbaic, LDL 91  Seizure disorder - stable -Continue Topamax, and Lamictal Rx  Atrial fibrillation -change to NOAC, defer to Renal, Continue Metoprolol  Chronic headaches -due to and Since TBI -On Topamax Rx  Asthma -stable, Albuterol PRN   GERD -Protonix   hx of TBI with SAH in 2015  Chronic back pain -s/p ESI  DVT Prophylaxis -Lovenox  Code Status: Full Code Family Communication: none at bedside Disposition Plan: home when improved   Consultants:  Neuro  HPI/Subjective: Still with mild RUE and RLE numbness, improved from yesterday  Objective: Filed Vitals:   04/23/15 0953  BP: 131/82  Pulse: 60  Temp:   Resp:     Intake/Output Summary (Last 24 hours) at 04/23/15 1353 Last data filed at 04/23/15 0858  Gross per 24 hour  Intake    600 ml  Output    275 ml  Net    325 ml   Filed Weights   04/23/15 0030  Weight: 120.112 kg (264 lb 12.8 oz)    Exam:   General:  AAOx3, no distress  Cardiovascular: S1S2/RRR  Respiratory: CTAB  Abdomen: soft, NT, BS present  Musculoskeletal: no edema c/c  Neuro: Mild sensory deficits on R side  Data Reviewed: Basic Metabolic Panel:  Recent Labs Lab 04/22/15 2017  NA 140  K 3.9  CL 109  CO2 22  GLUCOSE 114*  BUN 23*  CREATININE 0.99  CALCIUM 9.4   Liver Function Tests:  Recent Labs Lab 04/22/15 2017  AST 16  ALT 31  ALKPHOS 95  BILITOT 0.6  PROT 7.8  ALBUMIN 4.2   No results for input(s): LIPASE, AMYLASE in the last 168 hours. No results for input(s): AMMONIA in the last 168 hours. CBC:  Recent Labs Lab 04/22/15 2017  WBC 12.0*  NEUTROABS 8.0*  HGB 15.0  HCT 44.9  MCV 83.5  PLT 209   Cardiac Enzymes:  Recent  Labs Lab 04/23/15 0414 04/23/15 0825  TROPONINI <0.03 <0.03   BNP (last 3 results)  Recent Labs  10/01/14 1448  BNP 30.9    ProBNP (last 3 results) No results for input(s): PROBNP in the last 8760 hours.  CBG: No results for input(s): GLUCAP in the last 168 hours.  No results found for this or any previous visit (from the past 240 hour(s)).   Studies: Ct Head Wo Contrast  04/22/2015   CLINICAL DATA:  "Pt states that this morning around 315am when pt got up to use the bathroom noticed that her right side of her body was numb and weak like. Pt states that she just rubbed her leg thinking it would help. Pt states that she had an epidural injection last week and not for sure if could be from that. Pt states that her right foot feels heavy and her right grip is slightly weaker than her left grip. Pt states that around 830 this mornign she started having center chest pain that is pressure like. Pt states nausea but denies vomiting. "  Left side numbness.   Left arm feels weak  EXAM: CT HEAD WITHOUT CONTRAST  TECHNIQUE: Contiguous axial images were obtained from the base of the skull through the vertex without intravenous contrast.  COMPARISON:  Brain MRI, 03/29/2014.  Head CT, 08/07/2012.  FINDINGS: The ventricles are normal in size and configuration. There are no parenchymal masses or mass effect. There is no evidence of an infarct. There are no extra-axial masses or abnormal fluid collections.  There is no intracranial hemorrhage.  Visualized sinuses and mastoid air cells are clear.  No skull  IMPRESSION: Normal unenhanced CT scan of the brain.   Electronically Signed   By: Lajean Manes M.D.   On: 04/22/2015 21:31   Mr Brain Wo Contrast  04/23/2015   CLINICAL DATA:  Stroke. History subarachnoid hemorrhage 2015. Seizure, chronic headache  EXAM: MRI HEAD WITHOUT CONTRAST  MRA HEAD WITHOUT CONTRAST  TECHNIQUE: Multiplanar, multiecho pulse sequences of the brain and surrounding structures were  obtained without intravenous contrast. Angiographic images of the head were obtained using MRA technique without contrast.  COMPARISON:  CT 04/22/2015.  MRI 03/29/2014  FINDINGS: MRI HEAD FINDINGS  Ventricle size is normal. Cerebral volume is normal. Pituitary normal in size. Negative for Chiari malformation  Negative for acute or chronic infarction  Negative for demyelinating disease. Cerebral white matter is normal. Brainstem and cerebellum normal.  Negative for intracranial hemorrhage.  Negative for mass or edema.  Medial temporal lobe normal in signal and volume bilaterally. Negative for mesial temporal sclerosis  Paranasal sinuses are clear.  Normal orbit.  MRA HEAD FINDINGS  Left vertebral artery dominant. Right vertebral artery ends in PICA. Left vertebral artery patent to the basilar without stenosis. Left PICA patent. Basilar widely patent. Superior cerebellar and posterior cerebral arteries patent bilaterally.  Internal carotid artery patent bilaterally without stenosis. Anterior and middle cerebral arteries patent bilaterally without significant stenosis  Negative for cerebral aneurysm.  IMPRESSION: Negative MRI of the head  Negative MRA of the head.   Electronically Signed   By: Franchot Gallo M.D.   On: 04/23/2015 13:06   Mr Cervical Spine W Wo Contrast  04/23/2015   CLINICAL DATA:  Right-sided numbness and weakness.  Headache.  EXAM: MRI CERVICAL SPINE WITHOUT AND WITH CONTRAST  TECHNIQUE: Multiplanar and multiecho pulse sequences of the cervical spine, to include the craniocervical junction and cervicothoracic junction, were obtained according to standard protocol without and with intravenous contrast.  CONTRAST:  20 mL MultiHance IV  COMPARISON:  None.  FINDINGS: Normal alignment. Negative for fracture or mass. Spinal cord signal is normal. Normal enhancement following contrast infusion.  No significant degenerative change in the cervical spine. Negative for disc protrusion or spinal stenosis.   Small central disc protrusions at T2-3 and T3-4 without significant spinal or foraminal encroachment.  IMPRESSION: Negative MRI of the cervical spine with contrast  Small central disc protrusions at T2-3 and T3-4.   Electronically Signed   By: Franchot Gallo M.D.   On: 04/23/2015 12:57   Mr Jodene Nam Head/brain Wo Cm  04/23/2015   CLINICAL DATA:  Stroke. History subarachnoid hemorrhage 2015. Seizure, chronic headache  EXAM: MRI HEAD WITHOUT CONTRAST  MRA HEAD WITHOUT CONTRAST  TECHNIQUE: Multiplanar, multiecho pulse sequences of the brain and surrounding structures were obtained without intravenous contrast. Angiographic images of the head were obtained using MRA technique without contrast.  COMPARISON:  CT 04/22/2015.  MRI 03/29/2014  FINDINGS: MRI HEAD FINDINGS  Ventricle size is normal. Cerebral volume is normal. Pituitary normal in size. Negative for Chiari malformation  Negative for acute or chronic infarction  Negative for demyelinating disease. Cerebral white matter is normal. Brainstem and cerebellum normal.  Negative for intracranial hemorrhage.  Negative for mass or edema.  Medial temporal lobe normal in  signal and volume bilaterally. Negative for mesial temporal sclerosis  Paranasal sinuses are clear.  Normal orbit.  MRA HEAD FINDINGS  Left vertebral artery dominant. Right vertebral artery ends in PICA. Left vertebral artery patent to the basilar without stenosis. Left PICA patent. Basilar widely patent. Superior cerebellar and posterior cerebral arteries patent bilaterally.  Internal carotid artery patent bilaterally without stenosis. Anterior and middle cerebral arteries patent bilaterally without significant stenosis  Negative for cerebral aneurysm.  IMPRESSION: Negative MRI of the head  Negative MRA of the head.   Electronically Signed   By: Franchot Gallo M.D.   On: 04/23/2015 13:06    Scheduled Meds: . sodium chloride   Intravenous STAT  . aspirin  300 mg Rectal Daily   Or  . aspirin  325 mg  Oral Daily  . enoxaparin (LOVENOX) injection  40 mg Subcutaneous Q24H  . [START ON 04/24/2015] Influenza vac split quadrivalent PF  0.5 mL Intramuscular Tomorrow-1000  . lamoTRIgine  100 mg Oral BID  . LORazepam      . metoprolol tartrate  25 mg Oral BID  . pantoprazole  40 mg Oral Daily  . [START ON 04/24/2015] pneumococcal 23 valent vaccine  0.5 mL Intramuscular Tomorrow-1000  . topiramate  100 mg Oral BID   Continuous Infusions:  Antibiotics Given (last 72 hours)    None      Principal Problem:   CVA (cerebral infarction) Active Problems:   GERD (gastroesophageal reflux disease)   Paresthesia   Right sided weakness   Seizure disorder   Chronic headaches   Atrial fibrillation   Asthma    Time spent: 64min    Naethan Bracewell  [facility] Pager [phone]. If 7PM-7AM, please contact night-coverage at [email or web address], password [facility] 04/23/2015, 1:53 PM

## 2015-04-23 NOTE — Evaluation (Signed)
Physical Therapy Evaluation Patient Details Name: [name] [name] MRN: [id number] DOB: 1966/07/16 Today's Date: 04/23/2015   History of Present Illness  Patient is a 49 y/o female presents with right sided weakness/numbness. Head CT (-). MRI pending. PMH includes TBI, migraine, seizures,  A-fib.  Clinical Impression  Patient presents with mild right sided weakness/impaired sensation impacting safe mobility. Pt independent and working PTA. Now requires Min guard initially for safety during gait due to mild balance deficits. Will need to negotiate 15 steps prior to discharge home. Pt reports having supportive family. Will follow acutely to maximize independence and mobility prior to return home.    Follow Up Recommendations Outpatient PT;Supervision - Intermittent    Equipment Recommendations  Other (comment) (TBD.)    Recommendations for Other Services       Precautions / Restrictions Precautions Precautions: None Restrictions Weight Bearing Restrictions: No      Mobility  Bed Mobility Overal bed mobility: Modified Independent                Transfers Overall transfer level: Needs assistance Equipment used: None Transfers: Sit to/from Stand Sit to Stand: Supervision         General transfer comment: Supervision for safety. GUarded.  Ambulation/Gait Ambulation/Gait assistance: Min guard Ambulation Distance (Feet): 200 Feet Assistive device: None Gait Pattern/deviations: Step-through pattern;Decreased stride length;Wide base of support;Drifts right/left   Gait velocity interpretation: <1.8 ft/sec, indicative of risk for recurrent falls General Gait Details: Slow, guarded gait with occasional drifting. No overt LOB. Mild dyspnea present. BP 131/82 post ambulation bout.  Stairs            Wheelchair Mobility    Modified Rankin (Stroke Patients Only) Modified Rankin (Stroke Patients Only) Pre-Morbid Rankin Score: No symptoms Modified Rankin: No  significant disability     Balance Overall balance assessment: Needs assistance Sitting-balance support: Feet supported;No upper extremity supported Sitting balance-Leahy Scale: Good     Standing balance support: During functional activity Standing balance-Leahy Scale: Fair                               Pertinent Vitals/Pain Pain Assessment: No/denies pain Pain Score: 5  Pain Location: Chest pressure Pain Descriptors / Indicators: Heaviness Pain Intervention(s):  (RN was notified.  )    Home Living Family/patient expects to be discharged to:: Private residence Living Arrangements: Alone Available Help at Discharge: Family Type of Home: House Home Access: Stairs to enter Entrance Stairs-Rails: Psychiatric nurse of Steps: 15 Home Layout: One level Home Equipment: None      Prior Function Level of Independence: Independent         Comments: Pt works as an Surveyor, minerals for school system.     Hand Dominance   Dominant Hand: Right    Extremity/Trunk Assessment   Upper Extremity Assessment: Defer to OT evaluation (Weak grip right > left but functional.)           Lower Extremity Assessment: RLE deficits/detail RLE Deficits / Details: Grossly ~4+/5 throughout RLE.        Communication   Communication: No difficulties  Cognition Arousal/Alertness: Awake/alert Behavior During Therapy: WFL for tasks assessed/performed Overall Cognitive Status: Within Functional Limits for tasks assessed                      General Comments      Exercises        Assessment/Plan  PT Assessment Patient needs continued PT services  PT Diagnosis Difficulty walking;Generalized weakness   PT Problem List Decreased strength;Cardiopulmonary status limiting activity;Impaired sensation;Decreased balance;Decreased mobility;Decreased activity tolerance  PT Treatment Interventions Balance training;Stair training;Functional mobility  training;Therapeutic activities;Therapeutic exercise;Patient/family education;Gait training   PT Goals (Current goals can be found in the Care Plan section) Acute Rehab PT Goals Patient Stated Goal: to make my right side feel normal PT Goal Formulation: With patient Time For Goal Achievement: 05/07/15 Potential to Achieve Goals: Fair    Frequency Min 4X/week   Barriers to discharge Decreased caregiver support;Inaccessible home environment 15 steps to enter townhome.  LIves alone    Co-evaluation               End of Session Equipment Utilized During Treatment: Gait belt Activity Tolerance: Patient tolerated treatment well Patient left: in bed;with call bell/phone within reach;with nursing/sitter in room Nurse Communication: Mobility status    Functional Assessment Tool Used: Clinical judgment Functional Limitation: Mobility: Walking and moving around Mobility: Walking and Moving Around Current Status (M0947): At least 1 percent but less than 20 percent impaired, limited or restricted Mobility: Walking and Moving Around Goal Status [phone]): At least 1 percent but less than 20 percent impaired, limited or restricted    Time: 0935-0950 PT Time Calculation (min) (ACUTE ONLY): 15 min   Charges:   PT Evaluation $Initial PT Evaluation Tier I: 1 Procedure     PT G Codes:   PT G-Codes **NOT FOR INPATIENT CLASS** Functional Assessment Tool Used: Clinical judgment Functional Limitation: Mobility: Walking and moving around Mobility: Walking and Moving Around Current Status (Z6629): At least 1 percent but less than 20 percent impaired, limited or restricted Mobility: Walking and Moving Around Goal Status [phone]): At least 1 percent but less than 20 percent impaired, limited or restricted    [place] 04/23/2015, 10:29 AM Wray Kearns, PT, DPT [phone]

## 2015-04-23 NOTE — Evaluation (Signed)
Speech Language Pathology Evaluation Patient Details Name: [name] [name] MRN: [id number] DOB: 1965/09/22 Today's Date: 04/23/2015 Time: 8101-7510 SLP Time Calculation (min) (ACUTE ONLY): 20 min  Problem List:  Patient Active Problem List   Diagnosis Date Noted  . Paresthesia 04/22/2015  . CVA (cerebral infarction) 04/22/2015  . Right sided weakness 04/22/2015  . Seizure disorder 04/22/2015  . Chronic headaches 04/22/2015  . Atrial fibrillation 04/22/2015  . Asthma 04/22/2015  . Right arm weakness   . Asthma, mild intermittent   . History of recent fall 03/09/2014  . Migraine without aura 06/09/2013  . 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 Obesity, Class III, BMI 40-49.9 (morbid obesity) 12/21/2012  . GERD (gastroesophageal reflux disease)   . Incisional hernia, without obstruction or gangrene 11/06/2012   Past Medical History:  Past Medical History  Diagnosis Date  . Migraine   . Seizures   . Asthma   . Obesity, Class III, BMI 40-49.9 (morbid obesity) 12/21/2012  . Shortness of breath dyspnea   . Anginal pain   . Anxiety   . Atrial fibrillation    Past Surgical History:  Past Surgical History  Procedure Laterality Date  . Abdominal hysterectomy    . Cesarean section    . Esophagogastroduodenoscopy N/A 11/21/2012    Procedure: ESOPHAGOGASTRODUODENOSCOPY (EGD);  Surgeon: Irene Shipper, MD;  Location: [facility];  Service: Endoscopy;  Laterality: N/A;  . Colonoscopy N/A 11/21/2012    Procedure: COLONOSCOPY;  Surgeon: Irene Shipper, MD;  Location: [facility];  Service: Endoscopy;  Laterality: N/A;  . Dilation and curettage of uterus    . Hernia repair  12/17/12    laparoscopic incisional   . Incisional hernia repair N/A 12/17/2012    Procedure: LAPAROSCOPIC INCISIONAL HERNIA;  Surgeon: Harl Bowie, MD;  Location: [facility];  Service: General;  Laterality: N/A;  . Insertion of mesh N/A 12/17/2012    Procedure: INSERTION OF MESH;  Surgeon: Harl Bowie,  MD;  Location: [facility];  Service: General;  Laterality: N/A;  . Cardiac catheterization N/A 01/21/2015    Procedure: Left Heart Cath;  Surgeon: Yolonda Kida, MD;  Location: [facility];  Service: Cardiovascular;  Laterality: N/A;   HPI:  [name] [name] is a 49 y.o. 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 Her symptoms have not changed. She reports having a headache, but reports that she has had daily headaches since her head injury in 2015. She denies any visual changes or facial weakness. She reports having a lumbar epidural injection 1 week ago for lumbar pain.  Current CT head is negative for acute findings.    Assessment / Plan / Recommendation Clinical Impression  Cognitive/linguistic evaluation was completed.  The patient did not present with any receptive/expressive language deficits nor motor speech issues.  Her speech was clear and completely intelligible.  She reports issues with short term memory since her TBI in July 2015.  Cognitive issues were not noted during evaluation.  Strategies to facilitate recall where discussed with the patient.  She is currently working full time and plans to return to work.   Acute ST needs are not identified.      SLP Assessment  Patient does not need any further Speech Lanaguage Pathology Services    Follow Up Recommendations    None  Pertinent Vitals/Pain Pain Assessment: 0-10 Pain Score: 5  Pain Location: Chest pressure Pain Descriptors / Indicators: Heaviness Pain Intervention(s):  (RN was notified.  )   SLP Goals  Patient/Family Stated Goal: None stated  SLP Evaluation Prior Functioning  Cognitive/Linguistic Baseline: Within functional limits Type of Home: House  Lives With: Alone Available Help at Discharge: Family (Son  lives nearby and checks on her daily.  ) Vocation: Full time employment   Cognition  Overall Cognitive Status: Within Functional Limits for tasks assessed Arousal/Alertness: Awake/alert Orientation Level: Oriented X4 Attention: Sustained Sustained Attention: Appears intact Memory: Appears intact Awareness: Appears intact Problem Solving: Appears intact Safety/Judgment: Appears intact Comments: Pt c/o memory issues since her TBI in 2015.  Discussed strategies to facilitate recall.      Comprehension  Auditory Comprehension Overall Auditory Comprehension: Appears within functional limits for tasks assessed Yes/No Questions: Within Functional Limits Commands: Within Functional Limits Conversation: Complex Reading Comprehension Reading Status: Within funtional limits    Expression Expression Primary Mode of Expression: Verbal Verbal Expression Overall Verbal Expression: Appears within functional limits for tasks assessed Initiation: No impairment Automatic Speech: Name;Social Response Level of Generative/Spontaneous Verbalization: Conversation Repetition: No impairment Naming: No impairment Pragmatics: No impairment Non-Verbal Means of Communication: Not applicable Written Expression Dominant Hand: Right   Oral / Motor Motor Speech Overall Motor Speech: Appears within functional limits for tasks assessed Respiration: Within functional limits Phonation: Normal Resonance: Within functional limits Articulation: Within functional limitis Intelligibility: Intelligible Motor Planning: Witnin functional limits Motor Speech Errors: Not applicable   GO     Lamar Sprinkles 04/23/2015, 9:11 AM  Shelly Flatten, [place], [facility] SLP [phone]

## 2015-04-24 ENCOUNTER — Observation Stay (HOSPITAL_BASED_OUTPATIENT_CLINIC_OR_DEPARTMENT_OTHER): Payer: BC Managed Care – PPO

## 2015-04-24 DIAGNOSIS — I6789 Other cerebrovascular disease: Secondary | ICD-10-CM | POA: Diagnosis not present

## 2015-04-24 DIAGNOSIS — I480 Paroxysmal atrial fibrillation: Secondary | ICD-10-CM

## 2015-04-24 DIAGNOSIS — I639 Cerebral infarction, unspecified: Secondary | ICD-10-CM | POA: Diagnosis not present

## 2015-04-24 DIAGNOSIS — M6289 Other specified disorders of muscle: Secondary | ICD-10-CM | POA: Diagnosis not present

## 2015-04-24 DIAGNOSIS — G40909 Epilepsy, unspecified, not intractable, without status epilepticus: Secondary | ICD-10-CM | POA: Diagnosis not present

## 2015-04-24 DIAGNOSIS — K219 Gastro-esophageal reflux disease without esophagitis: Secondary | ICD-10-CM | POA: Diagnosis not present

## 2015-04-24 LAB — URINALYSIS, ROUTINE W REFLEX MICROSCOPIC
BILIRUBIN URINE: NEGATIVE
GLUCOSE, UA: NEGATIVE mg/dL
Ketones, ur: NEGATIVE mg/dL
Leukocytes, UA: NEGATIVE
Nitrite: NEGATIVE
PROTEIN: NEGATIVE mg/dL
Specific Gravity, Urine: 1.017 (ref 1.005–1.030)
UROBILINOGEN UA: 0.2 mg/dL (ref 0.0–1.0)
pH: 5.5 (ref 5.0–8.0)

## 2015-04-24 LAB — URINE MICROSCOPIC-ADD ON

## 2015-04-24 MED ORDER — KETOROLAC TROMETHAMINE 15 MG/ML IJ SOLN
15.0000 mg | Freq: Once | INTRAMUSCULAR | Status: AC
  Administered 2015-04-24: 15 mg via INTRAVENOUS
  Filled 2015-04-24: qty 1

## 2015-04-24 MED ORDER — KETOROLAC TROMETHAMINE 30 MG/ML IJ SOLN
30.0000 mg | Freq: Once | INTRAMUSCULAR | Status: AC
  Administered 2015-04-24: 30 mg via INTRAVENOUS
  Filled 2015-04-24: qty 1

## 2015-04-24 MED ORDER — APIXABAN 5 MG PO TABS: 5.0000 mg | ORAL_TABLET | Freq: Two times a day (BID) | ORAL | Status: DC

## 2015-04-24 MED ORDER — PROMETHAZINE HCL 25 MG/ML IJ SOLN
12.5000 mg | Freq: Four times a day (QID) | INTRAMUSCULAR | Status: DC | PRN
  Administered 2015-04-24 (×2): 12.5 mg via INTRAVENOUS
  Filled 2015-04-24 (×2): qty 1

## 2015-04-24 MED ORDER — KETOROLAC TROMETHAMINE 30 MG/ML IJ SOLN
15.0000 mg | Freq: Once | INTRAMUSCULAR | Status: AC
  Administered 2015-04-24: 15 mg via INTRAVENOUS
  Filled 2015-04-24: qty 1

## 2015-04-24 MED ORDER — APIXABAN 5 MG PO TABS
5.0000 mg | ORAL_TABLET | Freq: Two times a day (BID) | ORAL | Status: DC
  Administered 2015-04-24 – 2015-04-25 (×3): 5 mg via ORAL
  Filled 2015-04-24 (×3): qty 1

## 2015-04-24 MED ORDER — METOPROLOL TARTRATE 25 MG PO TABS: 25.0000 mg | ORAL_TABLET | Freq: Two times a day (BID) | ORAL | Status: DC

## 2015-04-24 NOTE — Progress Notes (Signed)
  Echocardiogram 2D Echocardiogram has been performed.  Darlina Sicilian M 04/24/2015, 1:04 PM

## 2015-04-24 NOTE — Progress Notes (Signed)
PT Cancellation Note  Patient Details Name: [name] [name] MRN: [id number] DOB: 1966/05/14   Cancelled Treatment:    Reason Eval/Treat Not Completed: Pain limiting ability to participate   Reports a terrible migraine this am;   Will follow up later today as time allows;  Otherwise, will follow up for PT tomorrow;   Thank you,  Roney Marion, [place] Pager [phone] Office [phone]     Roney [facility] 04/24/2015, 8:26 AM

## 2015-04-24 NOTE — Progress Notes (Addendum)
STROKE TEAM PROGRESS NOTE  HPI [name] [name] is an 49 y.o. female hx of TBI with SAH in 2015, seizures, chronic headache, A fib (not on anticoagulation) presenting with right sided motor and sensory deficits. LSW 2100 on 8/26. Awoke at 0315 today and noted weakness and numbness on her right side. Denies any speech or visual deficits. Reports chronic headache since TBI in 2015.   CT head imaging reviewed and overall unremarkable.   Date last known well: 8/26 Time last known well: 2100 tPA Given: no, outside tPA window Modified Rankin: Rankin Score=0   SUBJECTIVE (INTERVAL HISTORY); She is improving more today, weakness is better, still with mild numbness and tingling on the right side    OBJECTIVE Temp:  [97.9 F (36.6 C)-98.6 F (37 C)] 97.9 F (36.6 C) (08/28 0435) Pulse Rate:  [50-93] 50 (08/28 0035) Cardiac Rhythm:  [-] Sinus bradycardia (08/28 0751) Resp:  [16-18] 17 (08/28 0431) BP: (112-135)/(67-82) 114/79 mmHg (08/28 0435) SpO2:  [95 %-99 %] 97 % (08/28 0431) Weight:  [121.02 kg (266 lb 12.8 oz)] 121.02 kg (266 lb 12.8 oz) (08/28 0452)     Component Value Date/Time   CHOL 145 04/23/2015 0414   CHOL 141 09/27/2014 0601   TRIG 53 04/23/2015 0414   TRIG 64 09/27/2014 0601   HDL 43 04/23/2015 0414   HDL 44 09/27/2014 0601   CHOLHDL 3.4 04/23/2015 0414   VLDL 11 04/23/2015 0414   VLDL 13 09/27/2014 0601   LDLCALC 91 04/23/2015 0414   LDLCALC 84 09/27/2014 0601   Lab Results  Component Value Date   HGBA1C 5.9* 03/08/2015      Component Value Date/Time   LABOPIA NONE DETECTED 04/22/2015 2010   COCAINSCRNUR NONE DETECTED 04/22/2015 2010   LABBENZ NONE DETECTED 04/22/2015 2010   AMPHETMU NONE DETECTED 04/22/2015 2010   THCU NONE DETECTED 04/22/2015 2010   LABBARB NONE DETECTED 04/22/2015 2010     Basic Metabolic Panel:   Recent Labs Lab 04/22/15 2017  NA 140  K 3.9  CL 109  CO2 22  GLUCOSE 114*  BUN 23*  CREATININE 0.99  CALCIUM 9.4   CBC:    Recent Labs Lab 04/22/15 2017  WBC 12.0*  NEUTROABS 8.0*  HGB 15.0  HCT 44.9  MCV 83.5  PLT 209   Coagulation:   Recent Labs Lab 04/22/15 2040  LABPROT 14.1  INR 1.07    IMAGING  Ct Head Wo Contrast 04/22/2015    Normal unenhanced CT scan of the brain.       MRI of the Cervical Spine  04/23/2015 Negative MRI of the cervical spine with contrast Small central disc protrusions at T2-3 and T3-4.    MRI / MRA Head / Brain  04/23/2015 Negative MRI of the head Negative MRA of the head.      Physical exam:  Exam: Gen: NAD Eyes: anicteric sclerae, moist conjunctivae                    CV: no MRG, no carotid bruits, no peripheral edema Mental Status: Alert, follows commands, good historian, oriented to person, place, date, situation  Neuro: Detailed Neurologic Exam  Speech:    No aphasia, no dysarthria  Cranial Nerves:    The pupils are equal, round, and reactive to light.. Attempted, Fundi not visualized due to small pupils.  Visual fields intact to confrontation. EOMI. No gaze preference. Visual fields full. Face symmetric and intact to LT, Tongue midline.uvula midline.  Hearing intact  to voice. Shoulder shrug intact  Motor Observation:    no involuntary movements noted. Tone appears normal.     Strength:    Minimal right-sided hemiparesis     Sensation: right minimal hemisensory loss  DTRs: 2+ throughout  Plantars downgoing.   Coordination: No dysmetria  Gait: Patient able to ambulate normally down the hallway with PT    ASSESSMENT/PLAN [name] is a 49 y.o. female with history of TBI with SAH in 2015, seizures, chronic headache, A fib (not on anticoagulation) presenting with right-sided motor and sensory deficits. She did not receive IV t-PA due to late presentation and minimal deficits.  Stroke / TIA:  Dominant - possibly embolic secondary to atrial fibrillation without anticoagulation vs complicated migraine. Difficult to  assess if symptoms are associated with migraine as patient has a continuous headache with superimposed migraines. Patient denies migrainous symptoms the day of onset of right-sided motor and sensory deficits.    Resultant  Improving right hemiparesis  MRI  negative  MRA  negative  MRI - C spine - Negative MRI of the cervical spine with contrast Small central disc protrusions at T2-3 and T3-4.  Carotid Doppler Bilateral: 1-39% ICA stenosis. Vertebral artery flow is antegrade  2D Echo pending  LDL 91  HgbA1c pending  Lovenox for VTE prophylaxis    no antithrombotic prior to admission,  Discussed with patient, started on Eliquis. Patient has a headache continuously so difficult to assess whether this could be complications of migraine but given recent afib it is probably best to start Eliquis at this time. Patient can follow up with Dr. Brett Fairy outpatient.  Patient counseled to be compliant with her antithrombotic medications  Ongoing aggressive stroke risk factor management  Therapy recommendations: Outpatient physical therapy - occupational therapy evaluation is pending.  Disposition: Pending  Hypertension  Stable Permissive hypertension (OK if < 220/120) but gradually normalize in 5-7 days  Hyperlipidemia  Home meds:  No lipid lowering medications prior to admission.  LDL 91 , goal < 70  Consider adding Lipitor 20 mg daily  Continue statin at discharge  Diabetes  HgbA1c pending, goal < 7.0  No history of diabetes  Other Stroke Risk Factors  ETOH use  Obesity, Body mass index is 47.27 kg/(m^2).   Migraines   Other Active Problems  Mild leukocytosis  This patients CHA2DS2-VASc Score and unadjusted Ischemic Stroke Rate (% per year) is equal to 2.2 % stroke rate/year from a score of 2  Above score calculated as 1 point each if present [CHF, HTN, DM, Vascular=MI/PAD/Aortic Plaque, Age if 65-74, or Female] Above score calculated as 2 points each if  present [Age > 75, or Stroke/TIA/TE]  IF ECHO IS UNREMARKABLE OK FOR DISCHARGE FROM THE STROKE PERSPECTIVE. FOLLOW UP WITH HER NEUROLOGIST DR DOHMEIER IN 2 MONTHS.    Hospital day #   [name] PA-C [facility] Pager [phone] 04/24/2015, 8:44 AM   Personally examined patient and images, and have participated in and made any corrections needed to history, physical, neuro exam,assessment and plan as stated above. 49 year old patient with afib who is not on anticoagulation who awoke in the middle of the night with right-sided weakness and sensory changes likely embolic from afib. Exam shows mild right-sided hemiparesis and hemisensory loss improved from admission. MRi was negative.  Discussed with patient, started on Eliquis.Patient has a headache continuously so difficult to assess whether this could be complications of migraine but given afib it is probably best  to start Eliquis at this time. Patient can follow up with Dr. Brett Fairy outpatient. Also f/u with cardiology.      To contact Stroke Continuity provider, please refer to [email or web address] After hours, contact General Neurology

## 2015-04-24 NOTE — Care Management Note (Signed)
Case Management Note  Patient Details  Name: TABBY BEASTON MRN: [id number] Date of Birth: 1966-02-21  Subjective/Objective:                   Chest Pain Action/Plan: Discharge planning  Expected Discharge Date:  04/24/15               Expected Discharge Plan:  Home/Self Care  In-House Referral:     Discharge planning Services  CM Consult, Medication Assistance  Post Acute Care Choice:    Choice offered to:     DME Arranged:    DME Agency:     HH Arranged:    HH Agency:     Status of Service:     Medicare Important Message Given:    Date Medicare IM Given:    Medicare IM give by:    Date Additional Medicare IM Given:    Additional Medicare Important Message give by:     If discussed at [place] of Stay Meetings, dates discussed:    Additional Comments: CM met with pt in room and gave pt free 30 day card for Eliquis. Pt verbalized understanding this will cover the cost of discharge prescription and give insurance enough time to have medication approved for refills to be covered by second card (given to pt for copay of 10.00/month). NO other Cm needs were communicated. Dellie Catholic, RN 04/24/2015, 4:02 PM

## 2015-04-24 NOTE — Evaluation (Addendum)
Occupational Therapy Evaluation Patient Details Name: [name] [name] MRN: [id number] DOB: [birth year] Today's Date: 04/24/2015    History of Present Illness Patient is a 49 y.o. female who presented with right sided weakness/numbness. Head CT (-) and MRI negative. PMH includes TBI, migraine, seizures,  A-fib.   Clinical Impression   Pt admitted with above. Education provided in session and OT signing off.    Follow Up Recommendations  No OT follow up    Equipment Recommendations  None recommended by OT    Recommendations for Other Services       Precautions / Restrictions Precautions Precautions: None Restrictions Weight Bearing Restrictions: No      Mobility Bed Mobility Overal bed mobility: Modified Independent                Transfers Overall transfer level: Modified independent                    Balance    No LOB in session. Pt able to pick something off of floor. Pt reports she feels her balance is off some.                                        ADL Overall ADL's : Needs assistance/impaired             Lower Body Bathing: Supervison/ safety (standing; supervision to retrieve items)       Lower Body Dressing: Supervision/safety;Sit to/from stand (supervision to retrieve items)   Toilet Transfer: Supervision/safety;Ambulation (sit to stand from bed-Mod I)       Tub/ Shower Transfer: Tub transfer;Supervision/safety;Ambulation (for lines)   Functional mobility during ADLs: Supervision/safety General ADL Comments:  Pt reports she feels her balance is off some, so discussed that sitting to get clothing over feet is safest. Pt able to stand and simulate LB bathing with no difficulty. Educated on BE FAST stroke information. Explained what a TIA was.     Vision Pt reports no change from baseline; wears glasses for reading Vision Assessment?: Yes Visual Fields: No apparent deficits   Perception     Praxis       Pertinent Vitals/Pain Pain Assessment: 0-10 Pain Score: 7  Pain Location: head Pain Descriptors / Indicators:  (migraine) Pain Intervention(s): Monitored during session     Hand Dominance Right   Extremity/Trunk Assessment Upper Extremity Assessment Upper Extremity Assessment: RUE deficits/detail RUE Deficits / Details: pt reports that she feels her right grip strength is slightly weaker but appeared to be very close to Lt grip strength when tested, if not the same RUE Sensation: decreased light touch   Lower Extremity Assessment Lower Extremity Assessment: Defer to PT evaluation       Communication Communication Communication: No difficulties   Cognition Arousal/Alertness: Awake/alert Behavior During Therapy: WFL for tasks assessed/performed Overall Cognitive Status: Within Functional Limits for tasks assessed                     General Comments       Exercises       Shoulder Instructions      Home Living Family/patient expects to be discharged to:: Private residence Living Arrangements: Alone Available Help at Discharge: Family Type of Home: House Home Access: Stairs to enter Technical brewer of Steps: 15 Entrance Stairs-Rails: Right;Left Home Layout: One level     Bathroom Shower/Tub: Tub/shower unit  Bathroom Toilet: Standard     Home Equipment: None      Lives With: Alone    Prior Functioning/Environment Level of Independence: Independent        Comments: Pt works as an Surveyor, minerals for school system.    OT Diagnosis: Generalized weakness   OT Problem List:     OT Treatment/Interventions:      OT Goals(Current goals can be found in the care plan section)    OT Frequency:     Barriers to D/C:            Co-evaluation              End of Session    Activity Tolerance: Patient tolerated treatment well Patient left: with call bell/phone within reach;with family/visitor present (sitting EOB)   Time:  1700-1715 OT Time Calculation (min): 15 min Charges:  OT General Charges $OT Visit: 1 Procedure OT Evaluation $Initial OT Evaluation Tier I: 1 Procedure G-Codes: OT G-codes **NOT FOR INPATIENT CLASS** Functional Assessment Tool Used: clinical judgment Functional Limitation: Self care Self Care Current Status (N8295): At least 1 percent but less than 20 percent impaired, limited or restricted Self Care Goal Status (A2130): At least 1 percent but less than 20 percent impaired, limited or restricted Self Care Discharge Status [phone]): At least 1 percent but less than 20 percent impaired, limited or restricted  Benito Mccreedy OTR/L [phone] 04/24/2015, 5:29 PM

## 2015-04-24 NOTE — Progress Notes (Signed)
[facility] PROGRESS NOTE  [name] [name] LKT:[id number] DOB: Nov 02, 1965 DOA: 04/22/2015 PCP: [name], MD  Assessment/Plan: R sided numbness -improved but not resolved -TIA vs complicated migraine -MRI/MRA negative -ECHO pending -Carotid duplex pending -Neuro following -change ASA to Apixaban, defer to NEurology -FU hbaic, LDL 91  Chronic back pain -s/p ESI by pain management MD recently  Seizure disorder - stable -Continue Topamax, and Lamictal Rx  Atrial fibrillation -in NSR -HR in 50s, continue metoprolol -change to NOAC, defer to Neuro  Chronic headaches/Migraine -due to and Since TBI -On Topamax Rx Try toradol and phenergan  Asthma -stable, Albuterol PRN   GERD -Protonix   hx of TBI with SAH in 2015 -MRI without evidence of residual SAH  DVT Prophylaxis -Lovenox  Code Status: Full Code Family Communication: none at bedside Disposition Plan: home pending ECHO and improvement in HA   Consultants:  Neuro  HPI/Subjective: Still with mild RUE and RLE numbness, improved from yesterday  Objective: Filed Vitals:   04/24/15 0916  BP: 128/78  Pulse: 57  Temp:   Resp:     Intake/Output Summary (Last 24 hours) at 04/24/15 1056 Last data filed at 04/24/15 0452  Gross per 24 hour  Intake      0 ml  Output    950 ml  Net   -950 ml   Filed Weights   04/23/15 0030 04/24/15 0452  Weight: 120.112 kg (264 lb 12.8 oz) 121.02 kg (266 lb 12.8 oz)    Exam:   General:  AAOx3, no distress  Cardiovascular: S1S2/RRR  Respiratory: CTAB  Abdomen: soft, NT, BS present  Musculoskeletal: no edema c/c  Neuro: Mild sensory deficits on R side  Data Reviewed: Basic Metabolic Panel:  Recent Labs Lab 04/22/15 2017  NA 140  K 3.9  CL 109  CO2 22  GLUCOSE 114*  BUN 23*  CREATININE 0.99  CALCIUM 9.4   Liver Function Tests:  Recent Labs Lab 04/22/15 2017  AST 16  ALT 31  ALKPHOS 95  BILITOT 0.6  PROT 7.8  ALBUMIN 4.2    No results for input(s): LIPASE, AMYLASE in the last 168 hours. No results for input(s): AMMONIA in the last 168 hours. CBC:  Recent Labs Lab 04/22/15 2017  WBC 12.0*  NEUTROABS 8.0*  HGB 15.0  HCT 44.9  MCV 83.5  PLT 209   Cardiac Enzymes:  Recent Labs Lab 04/23/15 0414 04/23/15 0825 04/23/15 1510  TROPONINI <0.03 <0.03 <0.03   BNP (last 3 results)  Recent Labs  10/01/14 1448  BNP 30.9    ProBNP (last 3 results) No results for input(s): PROBNP in the last 8760 hours.  CBG: No results for input(s): GLUCAP in the last 168 hours.  No results found for this or any previous visit (from the past 240 hour(s)).   Studies: Ct Head Wo Contrast  04/22/2015   CLINICAL DATA:  "Pt states that this morning around 315am when pt got up to use the bathroom noticed that her right side of her body was numb and weak like. Pt states that she just rubbed her leg thinking it would help. Pt states that she had an epidural injection last week and not for sure if could be from that. Pt states that her right foot feels heavy and her right grip is slightly weaker than her left grip. Pt states that around 830 this mornign she started having center chest pain that is pressure like. Pt states nausea but denies vomiting. "  Left  side numbness.   Left arm feels weak  EXAM: CT HEAD WITHOUT CONTRAST  TECHNIQUE: Contiguous axial images were obtained from the base of the skull through the vertex without intravenous contrast.  COMPARISON:  Brain MRI, 03/29/2014.  Head CT, 08/07/2012.  FINDINGS: The ventricles are normal in size and configuration. There are no parenchymal masses or mass effect. There is no evidence of an infarct. There are no extra-axial masses or abnormal fluid collections.  There is no intracranial hemorrhage.  Visualized sinuses and mastoid air cells are clear.  No skull  IMPRESSION: Normal unenhanced CT scan of the brain.   Electronically Signed   By: Lajean Manes M.D.   On: 04/22/2015  21:31   Mr Brain Wo Contrast  04/23/2015   CLINICAL DATA:  Stroke. History subarachnoid hemorrhage 2015. Seizure, chronic headache  EXAM: MRI HEAD WITHOUT CONTRAST  MRA HEAD WITHOUT CONTRAST  TECHNIQUE: Multiplanar, multiecho pulse sequences of the brain and surrounding structures were obtained without intravenous contrast. Angiographic images of the head were obtained using MRA technique without contrast.  COMPARISON:  CT 04/22/2015.  MRI 03/29/2014  FINDINGS: MRI HEAD FINDINGS  Ventricle size is normal. Cerebral volume is normal. Pituitary normal in size. Negative for Chiari malformation  Negative for acute or chronic infarction  Negative for demyelinating disease. Cerebral white matter is normal. Brainstem and cerebellum normal.  Negative for intracranial hemorrhage.  Negative for mass or edema.  Medial temporal lobe normal in signal and volume bilaterally. Negative for mesial temporal sclerosis  Paranasal sinuses are clear.  Normal orbit.  MRA HEAD FINDINGS  Left vertebral artery dominant. Right vertebral artery ends in PICA. Left vertebral artery patent to the basilar without stenosis. Left PICA patent. Basilar widely patent. Superior cerebellar and posterior cerebral arteries patent bilaterally.  Internal carotid artery patent bilaterally without stenosis. Anterior and middle cerebral arteries patent bilaterally without significant stenosis  Negative for cerebral aneurysm.  IMPRESSION: Negative MRI of the head  Negative MRA of the head.   Electronically Signed   By: Franchot Gallo M.D.   On: 04/23/2015 13:06   Mr Cervical Spine W Wo Contrast  04/23/2015   CLINICAL DATA:  Right-sided numbness and weakness.  Headache.  EXAM: MRI CERVICAL SPINE WITHOUT AND WITH CONTRAST  TECHNIQUE: Multiplanar and multiecho pulse sequences of the cervical spine, to include the craniocervical junction and cervicothoracic junction, were obtained according to standard protocol without and with intravenous contrast.  CONTRAST:   20 mL MultiHance IV  COMPARISON:  None.  FINDINGS: Normal alignment. Negative for fracture or mass. Spinal cord signal is normal. Normal enhancement following contrast infusion.  No significant degenerative change in the cervical spine. Negative for disc protrusion or spinal stenosis.  Small central disc protrusions at T2-3 and T3-4 without significant spinal or foraminal encroachment.  IMPRESSION: Negative MRI of the cervical spine with contrast  Small central disc protrusions at T2-3 and T3-4.   Electronically Signed   By: Franchot Gallo M.D.   On: 04/23/2015 12:57   Mr Jodene Nam Head/brain Wo Cm  04/23/2015   CLINICAL DATA:  Stroke. History subarachnoid hemorrhage 2015. Seizure, chronic headache  EXAM: MRI HEAD WITHOUT CONTRAST  MRA HEAD WITHOUT CONTRAST  TECHNIQUE: Multiplanar, multiecho pulse sequences of the brain and surrounding structures were obtained without intravenous contrast. Angiographic images of the head were obtained using MRA technique without contrast.  COMPARISON:  CT 04/22/2015.  MRI 03/29/2014  FINDINGS: MRI HEAD FINDINGS  Ventricle size is normal. Cerebral volume is normal. Pituitary normal  in size. Negative for Chiari malformation  Negative for acute or chronic infarction  Negative for demyelinating disease. Cerebral white matter is normal. Brainstem and cerebellum normal.  Negative for intracranial hemorrhage.  Negative for mass or edema.  Medial temporal lobe normal in signal and volume bilaterally. Negative for mesial temporal sclerosis  Paranasal sinuses are clear.  Normal orbit.  MRA HEAD FINDINGS  Left vertebral artery dominant. Right vertebral artery ends in PICA. Left vertebral artery patent to the basilar without stenosis. Left PICA patent. Basilar widely patent. Superior cerebellar and posterior cerebral arteries patent bilaterally.  Internal carotid artery patent bilaterally without stenosis. Anterior and middle cerebral arteries patent bilaterally without significant stenosis   Negative for cerebral aneurysm.  IMPRESSION: Negative MRI of the head  Negative MRA of the head.   Electronically Signed   By: Franchot Gallo M.D.   On: 04/23/2015 13:06    Scheduled Meds: . aspirin  300 mg Rectal Daily   Or  . aspirin  325 mg Oral Daily  . enoxaparin (LOVENOX) injection  40 mg Subcutaneous Q24H  . Influenza vac split quadrivalent PF  0.5 mL Intramuscular Tomorrow-1000  . ketorolac  15 mg Intravenous Once  . lamoTRIgine  100 mg Oral BID  . metoprolol tartrate  25 mg Oral BID  . pantoprazole  40 mg Oral Daily  . pneumococcal 23 valent vaccine  0.5 mL Intramuscular Tomorrow-1000  . topiramate  100 mg Oral BID   Continuous Infusions:  Antibiotics Given (last 72 hours)    None      Principal Problem:   CVA (cerebral infarction) Active Problems:   GERD (gastroesophageal reflux disease)   Paresthesia   Right sided weakness   Seizure disorder   Chronic headaches   Atrial fibrillation   Asthma    Time spent: 79min    Trinidi Toppins  [facility] Pager [phone]. If 7PM-7AM, please contact night-coverage at [email or web address], password [facility] 04/24/2015, 10:56 AM

## 2015-04-25 DIAGNOSIS — G40909 Epilepsy, unspecified, not intractable, without status epilepticus: Secondary | ICD-10-CM

## 2015-04-25 DIAGNOSIS — G44329 Chronic post-traumatic headache, not intractable: Secondary | ICD-10-CM | POA: Diagnosis not present

## 2015-04-25 DIAGNOSIS — G459 Transient cerebral ischemic attack, unspecified: Secondary | ICD-10-CM | POA: Insufficient documentation

## 2015-04-25 DIAGNOSIS — M6289 Other specified disorders of muscle: Secondary | ICD-10-CM

## 2015-04-25 DIAGNOSIS — G43909 Migraine, unspecified, not intractable, without status migrainosus: Secondary | ICD-10-CM

## 2015-04-25 DIAGNOSIS — K219 Gastro-esophageal reflux disease without esophagitis: Secondary | ICD-10-CM | POA: Diagnosis not present

## 2015-04-25 DIAGNOSIS — I480 Paroxysmal atrial fibrillation: Secondary | ICD-10-CM | POA: Diagnosis not present

## 2015-04-25 LAB — HEMOGLOBIN A1C
HEMOGLOBIN A1C: 5.9 % — AB (ref 4.8–5.6)
MEAN PLASMA GLUCOSE: 123 mg/dL

## 2015-04-25 MED ORDER — APIXABAN 5 MG PO TABS: 5.0000 mg | ORAL_TABLET | Freq: Two times a day (BID) | ORAL | Status: DC

## 2015-04-25 MED ORDER — ASPIRIN EC 325 MG PO TBEC: 325.0000 mg | DELAYED_RELEASE_TABLET | Freq: Every day | ORAL | Status: DC

## 2015-04-25 MED ORDER — GADOBENATE DIMEGLUMINE 529 MG/ML IV SOLN
20.0000 mL | Freq: Once | INTRAVENOUS | Status: AC
  Administered 2015-04-25: 20 mL via INTRAVENOUS

## 2015-04-25 MED ORDER — UNABLE TO FIND: Status: DC

## 2015-04-25 MED ORDER — METOPROLOL TARTRATE 25 MG PO TABS: 25.0000 mg | ORAL_TABLET | Freq: Two times a day (BID) | ORAL | Status: DC

## 2015-04-25 NOTE — Discharge Summary (Signed)
Physician Discharge Summary  [name] [name] TMA:[id number] DOB: [birth year] DOA: 04/22/2015  PCP: [name], MD  Admit date: 04/22/2015 Discharge date: 04/25/2015  Time spent: 45 minutes  Recommendations for Outpatient Follow-up:  1. Dr.Dohemeier in 2 weeks  Discharge Diagnoses:  Active Problems:   GERD (gastroesophageal reflux disease)   Paresthesia   Right sided weakness   Seizure disorder   Chronic headaches   Atrial fibrillation   Asthma   Discharge Condition: stable  Diet recommendation: low sodium  Filed Weights   04/23/15 0030 04/24/15 0452 04/25/15 0500  Weight: 120.112 kg (264 lb 12.8 oz) 121.02 kg (266 lb 12.8 oz) 121.791 kg (268 lb 8 oz)    History of present illness:  Chief Complaint: Right Sided Numbness and Weakness HPI: [name] [name] is a 49 y.o. female with a history of TBI with resultant [place] 02/2014 with Seizure disorder and Chronic headaches, Atrial fibrillation, and Asthma who presents to the ED with complaints of RUE and RLE numbness and weakness since awakening at 0315 AM.  She also reported having a headache, but has chronic headaches daily headaches since her head injury in 2015.She reports having a lumbar epidural injection 1 week ago for lumbar pain.  Hospital Course:  R sided numbness and weakness -improved but not resolved -Seen by Neurology, initially couldn't rule out TIA vs complicated migraine -Seen by Dr.Xu from Neurology today who felt that this is likely a Complicated migraine and not a TIA -MRI and MRA brain normal -ECHO with normal EF and wall motion -Carotid duplex unremarkable -Neuro following and recommended ASA 325mg  at discharge  Chronic back pain -s/p ESI by pain management MD recently  Seizure disorder - stable -Continue Topamax, and Lamictal Rx  Atrial fibrillation -in NSR -HR in 50s, continue metoprolol -h/o Afib at [facility] in 09/2014, followed by Cardiologist Dr.Callwood, on ASA for stroke prevention -Neuro did  not feel that she needed anticoagulation since she didn't have a TIA or CVA this time  Chronic headaches/Migraine -due to and Since TBI -On Topamax Rx -given toradol yesterday  Asthma -stable, Albuterol PRN   GERD -Protonix   hx of TBI with SAH in 2015 -MRI without evidence of residual SAH  Consultations:  Neuro  Discharge Exam: Filed Vitals:   04/25/15 0500  BP: 136/68  Pulse: 63  Temp: 97.5 F (36.4 C)  Resp: 18    General: AAOx3 Cardiovascular: S1S2/RRR Respiratory: CTAB   Discharge Instructions   Discharge Instructions    Diet - low sodium heart healthy    Complete by:  As directed      Increase activity slowly    Complete by:  As directed           Current Discharge Medication List    START taking these medications   Details  aspirin EC 325 MG tablet Take 1 tablet (325 mg total) by mouth daily. Refills: 0    UNABLE TO FIND This note is to excuse Ms.Covey from work due to medical illness requiring hospitalization from 8/26 through 8/29, ok to return to work 9/1 Qty: 1 each, Refills: 0      CONTINUE these medications which have CHANGED   Details  metoprolol tartrate (LOPRESSOR) 25 MG tablet Take 1 tablet (25 mg total) by mouth 2 (two) times daily. Qty: 60 tablet, Refills: 0      CONTINUE these medications which have NOT CHANGED   Details  albuterol (PROVENTIL HFA;VENTOLIN HFA) 108 (90 BASE) MCG/ACT inhaler Inhale 2 puffs into the  lungs every 6 (six) hours as needed for wheezing.    clonazePAM (KLONOPIN) 1 MG tablet Take 1 mg by mouth at bedtime as needed (for sleep and if she feels a seizure coming on).    lamoTRIgine (LAMICTAL) 100 MG tablet Take 1 tablet (100 mg total) by mouth 2 (two) times daily. Qty: 60 tablet, Refills: 6    omeprazole (PRILOSEC) 20 MG capsule Take 20 mg by mouth daily.    topiramate (TOPAMAX) 100 MG tablet Take 1 tablet (100 mg total) by mouth 2 (two) times daily. Qty: 60 tablet, Refills: 6   Associated Diagnoses:  Partial symptomatic epilepsy with complex partial seizures, not intractable, without status epilepticus; Intractable migraine without aura and with status migrainosus; Morbid obesity    Vitamin D, Ergocalciferol, (DRISDOL) 50000 UNITS CAPS capsule Take 1 capsule by mouth once a week. Every tuesday Refills: 5      STOP taking these medications     nortriptyline (PAMELOR) 10 MG capsule      ondansetron (ZOFRAN) 4 MG tablet      oseltamivir (TAMIFLU) 75 MG capsule        Allergies  Allergen Reactions  . Amoxicillin Hives and Swelling  . Biaxin [Clarithromycin] Hives  . Erythromycin Base Hives  . Penicillins Hives and Swelling  . Zithromax [Azithromycin] Hives and Nausea Only   Follow-up Information    Follow up with [name], MD. Schedule an appointment as soon as possible for a visit in 1 week.   Specialty:  Family Medicine   Contact information:   [place] [place] [address] [phone]       Follow up with [facility], MD. Schedule an appointment as soon as possible for a visit in 2 weeks.   Specialty:  Neurology   Contact information:   [address] [place] [address] [phone]       Follow up with [facility].   Specialty:  Rehabilitation   Why:  Office to call you to set up appointment, However if you have not heard from office within 2-3 business days please call them for appointment time. Thanks   Contact information:   [address] [place] [id number] [place] [phone]       The results of significant diagnostics from this hospitalization (including imaging, microbiology, ancillary and laboratory) are listed below for reference.    Significant Diagnostic Studies: Ct Head Wo Contrast  04/22/2015   CLINICAL DATA:  "Pt states that this morning around 315am when pt got up to use the bathroom noticed that her right side of her body was numb and weak  like. Pt states that she just rubbed her leg thinking it would help. Pt states that she had an epidural injection last week and not for sure if could be from that. Pt states that her right foot feels heavy and her right grip is slightly weaker than her left grip. Pt states that around 830 this mornign she started having center chest pain that is pressure like. Pt states nausea but denies vomiting. "  Left side numbness.   Left arm feels weak  EXAM: CT HEAD WITHOUT CONTRAST  TECHNIQUE: Contiguous axial images were obtained from the base of the skull through the vertex without intravenous contrast.  COMPARISON:  Brain MRI, 03/29/2014.  Head CT, 08/07/2012.  FINDINGS: The ventricles are normal in size and configuration. There are no parenchymal masses or mass effect. There is no evidence of an infarct.  There are no extra-axial masses or abnormal fluid collections.  There is no intracranial hemorrhage.  Visualized sinuses and mastoid air cells are clear.  No skull  IMPRESSION: Normal unenhanced CT scan of the brain.   Electronically Signed   By: Lajean Manes M.D.   On: 04/22/2015 21:31   Mr Brain Wo Contrast  04/23/2015   CLINICAL DATA:  Stroke. History subarachnoid hemorrhage 2015. Seizure, chronic headache  EXAM: MRI HEAD WITHOUT CONTRAST  MRA HEAD WITHOUT CONTRAST  TECHNIQUE: Multiplanar, multiecho pulse sequences of the brain and surrounding structures were obtained without intravenous contrast. Angiographic images of the head were obtained using MRA technique without contrast.  COMPARISON:  CT 04/22/2015.  MRI 03/29/2014  FINDINGS: MRI HEAD FINDINGS  Ventricle size is normal. Cerebral volume is normal. Pituitary normal in size. Negative for Chiari malformation  Negative for acute or chronic infarction  Negative for demyelinating disease. Cerebral white matter is normal. Brainstem and cerebellum normal.  Negative for intracranial hemorrhage.  Negative for mass or edema.  Medial temporal lobe normal in signal and  volume bilaterally. Negative for mesial temporal sclerosis  Paranasal sinuses are clear.  Normal orbit.  MRA HEAD FINDINGS  Left vertebral artery dominant. Right vertebral artery ends in PICA. Left vertebral artery patent to the basilar without stenosis. Left PICA patent. Basilar widely patent. Superior cerebellar and posterior cerebral arteries patent bilaterally.  Internal carotid artery patent bilaterally without stenosis. Anterior and middle cerebral arteries patent bilaterally without significant stenosis  Negative for cerebral aneurysm.  IMPRESSION: Negative MRI of the head  Negative MRA of the head.   Electronically Signed   By: Franchot Gallo M.D.   On: 04/23/2015 13:06   Mr Cervical Spine W Wo Contrast  04/23/2015   CLINICAL DATA:  Right-sided numbness and weakness.  Headache.  EXAM: MRI CERVICAL SPINE WITHOUT AND WITH CONTRAST  TECHNIQUE: Multiplanar and multiecho pulse sequences of the cervical spine, to include the craniocervical junction and cervicothoracic junction, were obtained according to standard protocol without and with intravenous contrast.  CONTRAST:  20 mL MultiHance IV  COMPARISON:  None.  FINDINGS: Normal alignment. Negative for fracture or mass. Spinal cord signal is normal. Normal enhancement following contrast infusion.  No significant degenerative change in the cervical spine. Negative for disc protrusion or spinal stenosis.  Small central disc protrusions at T2-3 and T3-4 without significant spinal or foraminal encroachment.  IMPRESSION: Negative MRI of the cervical spine with contrast  Small central disc protrusions at T2-3 and T3-4.   Electronically Signed   By: Franchot Gallo M.D.   On: 04/23/2015 12:57   Mr Jodene Nam Head/brain Wo Cm  04/23/2015   CLINICAL DATA:  Stroke. History subarachnoid hemorrhage 2015. Seizure, chronic headache  EXAM: MRI HEAD WITHOUT CONTRAST  MRA HEAD WITHOUT CONTRAST  TECHNIQUE: Multiplanar, multiecho pulse sequences of the brain and surrounding structures  were obtained without intravenous contrast. Angiographic images of the head were obtained using MRA technique without contrast.  COMPARISON:  CT 04/22/2015.  MRI 03/29/2014  FINDINGS: MRI HEAD FINDINGS  Ventricle size is normal. Cerebral volume is normal. Pituitary normal in size. Negative for Chiari malformation  Negative for acute or chronic infarction  Negative for demyelinating disease. Cerebral white matter is normal. Brainstem and cerebellum normal.  Negative for intracranial hemorrhage.  Negative for mass or edema.  Medial temporal lobe normal in signal and volume bilaterally. Negative for mesial temporal sclerosis  Paranasal sinuses are clear.  Normal orbit.  MRA HEAD FINDINGS  Left vertebral  artery dominant. Right vertebral artery ends in PICA. Left vertebral artery patent to the basilar without stenosis. Left PICA patent. Basilar widely patent. Superior cerebellar and posterior cerebral arteries patent bilaterally.  Internal carotid artery patent bilaterally without stenosis. Anterior and middle cerebral arteries patent bilaterally without significant stenosis  Negative for cerebral aneurysm.  IMPRESSION: Negative MRI of the head  Negative MRA of the head.   Electronically Signed   By: Franchot Gallo M.D.   On: 04/23/2015 13:06    Microbiology: No results found for this or any previous visit (from the past 240 hour(s)).   Labs: Basic Metabolic Panel:  Recent Labs Lab 04/22/15 2017  NA 140  K 3.9  CL 109  CO2 22  GLUCOSE 114*  BUN 23*  CREATININE 0.99  CALCIUM 9.4   Liver Function Tests:  Recent Labs Lab 04/22/15 2017  AST 16  ALT 31  ALKPHOS 95  BILITOT 0.6  PROT 7.8  ALBUMIN 4.2   No results for input(s): LIPASE, AMYLASE in the last 168 hours. No results for input(s): AMMONIA in the last 168 hours. CBC:  Recent Labs Lab 04/22/15 2017  WBC 12.0*  NEUTROABS 8.0*  HGB 15.0  HCT 44.9  MCV 83.5  PLT 209   Cardiac Enzymes:  Recent Labs Lab 04/23/15 0414  04/23/15 0825 04/23/15 1510  TROPONINI <0.03 <0.03 <0.03   BNP: BNP (last 3 results)  Recent Labs  10/01/14 1448  BNP 30.9    ProBNP (last 3 results) No results for input(s): PROBNP in the last 8760 hours.  CBG: No results for input(s): GLUCAP in the last 168 hours.     SignedDomenic Polite  [facility] 04/25/2015, 1:11 PM

## 2015-04-25 NOTE — Progress Notes (Signed)
STROKE TEAM PROGRESS NOTE   SUBJECTIVE (INTERVAL HISTORY) No family is at the bedside. RN present. Patient has been discharged with plans to be out of the hospital after lunch. Discussed in length with pt regarding possible compllicated migraine vs. TIA for the current presentation and the decision to take or not to take. eliquis.   OBJECTIVE Temp:  [97.5 F (36.4 C)-97.9 F (36.6 C)] 97.5 F (36.4 C) (08/29 0500) Pulse Rate:  [46-63] 63 (08/29 0500) Cardiac Rhythm:  [-] Normal sinus rhythm (08/29 0700) Resp:  [16-18] 18 (08/29 0500) BP: (123-136)/(68-76) 136/68 mmHg (08/29 0500) SpO2:  [100 %] 100 % (08/29 0500) Weight:  [121.791 kg (268 lb 8 oz)] 121.791 kg (268 lb 8 oz) (08/29 0500)   IMAGING  Ct Head Wo Contrast 04/22/2015    Normal unenhanced CT scan of the brain.      MRI of the Cervical Spine  04/23/2015 Negative MRI of the cervical spine with contrast Small central disc protrusions at T2-3 and T3-4.  MRI / MRA Head / Brain  04/23/2015 Negative MRI of the head Negative MRA of the head.  CAROTID DOPPLER  There is 1-39% bilateral ICA stenosis. Vertebral artery flow is antegrade.   2D ECHOCARDIOGRAM   Left ventricle: The cavity size was normal. Wall thickness wasincreased in a pattern of mild LVH. Systolic function was normal.The estimated ejection fraction was in the range of 60% to 65%.Wall motion was normal; there were no regional wall motionabnormalities.   Physical exam: Gen: NAD Eyes: anicteric sclerae, moist conjunctivae                    CV: no MRG, no carotid bruits, no peripheral edema Mental Status: Alert, follows commands, good historian, oriented to person, place, date, situation  Neuro: Detailed Neurologic Exam  Speech:    No aphasia, no dysarthria  Cranial Nerves:    The pupils are equal, round, and reactive to light.. Attempted, Fundi not visualized due to small pupils.  Visual fields intact to confrontation. EOMI. No gaze preference.  Visual fields full. Face symmetric and intact to LT, Tongue midline.uvula midline.  Hearing intact to voice. Shoulder shrug intact  Motor Observation:    no involuntary movements noted. Tone appears normal.     Strength:    Minimal right-sided hemiparesis     Sensation: right minimal hemisensory loss  DTRs: 2+ throughout  Plantars downgoing.   Coordination: No dysmetria  Gait: Patient able to ambulate normally down the hallway with PT    ASSESSMENT/PLAN [name] is a 49 y.o. female with history of TBI with SAH in 2015, seizures, chronic headache, A fib (not on anticoagulation) presenting with right-sided motor and sensory deficits. She did not receive IV t-PA due to late presentation and minimal deficits.  Complicated migraine most likely, TIA less likely - pt symptoms lasting for more than 2 days but negative on MRI, against either TIA or stroke. DWI negative infarct rarely happens in anterior circulation. Stroke work up all negative. Pt symptoms associated with severe HA, and also pt had similar presentation before with HA, this time is longer than prior. Overall, the episodes more consistent with complicated migraine, instead of TIA or stroke.  Resultant  Improving right hemiparesis  MRI  negative  MRA  negative  MRI - C spine - Negative MRI of the cervical spine with contrast Small central disc protrusions at T2-3 and T3-4.  Carotid Doppler Bilateral: 1-39% ICA stenosis. Vertebral artery flow is antegrade  2D Echo No source of embolus   LDL 91  HgbA1c 5.9  Lovenox for VTE prophylaxis Diet - low sodium heart healthy Diet Heart Room service appropriate?: Yes; Fluid consistency:: Thin  no antithrombotic prior to admission,  started on Eliquis this admission. Based on history and presentation, not convinced patient needs changing to eliquis. Recommend increase home aspirin dose to 325 mg daily and follow up with Cardiologist.   Patient counseled to be  compliant with her aspirin  Ongoing aggressive stroke risk factor management  Therapy recommendations: Outpatient physical therapy, no occupational therapy   Disposition: home with OP therapy  Atrial Fibrillation  Dx in [facility] ED Jan 2016 via EKG. Not placed on anticoagulation at that time due to low risk  No more episode of afib documented  Home anticoagulation:  none  CHA2DS2-VASc Score = 1, ?2 oral anticoagulation recommended  Age in Years:  <65   0   Sex:  Female   Female   +1    Hypertension History:  0     Diabetes Mellitus:  0   Congestive Heart Failure History:  0  Vascular Disease History:  0     Stroke/TIA/Thromboembolism History:  0  Continue aspirin 325 at discharge. No anticoagulation at this time as no stroke dx  Follow up with cardiologist Dr. Clayborn Bigness closely   Hyperlipidemia  Home meds:  No lipid lowering medications prior to admission.  LDL 91  Consider adding Lipitor 20 mg daily   Morbid obesity  Body mass index is 47.57 kg/(m^2).  weight loss recommended  Other Stroke Risk Factors  ETOH use  Migraines  Other Active Problems  Mild leukocytosis  Seizure disorder on Topamax, and Lamictal Rx  Hospital day #  2  Neurology will sign off. Please call with questions. Pt will follow up with Dr. Durene Cal at [facility] in about 2 months. Thanks for the consult.  Rosalin Hawking, MD PhD Stroke Neurology 04/26/2015 6:17 AM   To contact Stroke Continuity provider, please refer to [email or web address] After hours, contact General Neurology

## 2015-04-25 NOTE — Progress Notes (Signed)
04/23/15 0905  SLP Visit Information  SLP Received On 04/22/15  SLP Time Calculation  SLP Start Time (ACUTE ONLY) 0845  SLP Stop Time (ACUTE ONLY) 0905  SLP Time Calculation (min) (ACUTE ONLY) 20 min  Subjective  Subjective The patient was seen sitting in her bed.    Patient/Family Stated Goal None stated  General Information  Other Pertinent Information [name] [name] is a 49 y.o. 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 Her symptoms have not changed. She reports having a headache, but reports that she has had daily headaches since her head injury in 2015. She denies any visual changes or facial weakness. She reports having a lumbar epidural injection 1 week ago for lumbar pain.  Current CT head is negative for acute findings.   Prior Functional Status  Cognitive/Linguistic Baseline WFL  Type of Home House  Lives With Alone  Available Help at Discharge Family (Son lives nearby and checks on her daily.  )  Vocation Full time employment  Pain Assessment  Pain Assessment 0-10  Pain Score 5  Pain Location Chest pressure  Pain Descriptors / Indicators Heaviness  Pain Intervention(s) (RN was notified.  )  Cognition  Overall Cognitive Status Within Functional Limits for tasks assessed  Arousal/Alertness Awake/alert  Attention Sustained  Sustained Attention Appears intact  Memory Appears intact  Awareness Appears intact  Problem Solving Appears intact  Safety/Judgment Appears intact  Comments Pt c/o memory issues since her TBI in 2015.  Discussed strategies to facilitate recall.    Auditory Comprehension  Overall Auditory Comprehension Appears within functional limits for tasks assessed  Yes/No Questions WFL  Commands [facility]  Conversation Complex  Reading Comprehension  Reading  Status WFL  Expression  Primary Mode of Expression Verbal  Verbal Expression  Overall Verbal Expression Appears within functional limits for tasks assessed  Initiation No impairment  Automatic Speech Name;Social Response  Level of Generative/Spontaneous Verbalization Conversation  Repetition No impairment  Naming No impairment  Pragmatics No impairment  Non-Verbal Means of Communication Not applicable  Written Expression  Dominant Hand Right  Motor Speech  Overall Motor Speech Appears within functional limits for tasks assessed  Respiration WFL  Phonation Normal  Resonance [facility]  Articulation WFL  Intelligibility Intelligible  Motor Planning [facility]  Motor Speech Errors NA  SLP - End of Session  Patient left in bed;with call bell/phone within reach  Nurse Communication Cognitive/Linguistic strategies reviewed  Assessment  Clinical Impression Statement Cognitive/linguistic evaluation was completed.  The patient did not present with any receptive/expressive language deficits nor motor speech issues.  Her speech was clear.  She reports issues with short term memory since her TBI in July 2015.  Cognitive issues were not noted during evaluation.  Strategies to facilitate recall where discussed with the patient.  She is currently working full time and plans to return to work.   Acute ST needs are not identified.    SLP Recommendation/Assessment Patient does not need any further Speech Lanaguage Pathology Services  No Skilled Speech Therapy Patient at baseline level of functioning  SLP G-Codes **NOT FOR INPATIENT CLASS**  Functional Assessment Tool Used skilled clinical judgement via chart review  Functional Limitations Spoken language expressive  Spoken Language Expression Current Status [phone]) [place]  Spoken Language Expression Goal Status (Z0258) [place]  Spoken Language Expression  Discharge Status [phone]) [place]  SLP Evaluations  $ SLP Speech Visit 1 Procedure  SLP Evaluations  $ SLP EVAL  LANGUAGE/SOUND PRODUCTION 1 Procedure  Gabriel Rainwater MA, CCC-SLP [phone]

## 2015-04-25 NOTE — Care Management (Signed)
2330 04-25-15 CM did speak with pt in reference to Neuro Rehab. CM did send an outpatient Rehab consult sent via EPIC to Neuro rehab. Pt is aware to call within 2-3 business days if has not heard from Office. CM did call [facility] [address] to see if Eliquis is available and it is in stock. No further needs from CM at this time. Bethena Roys, RN, BSN [phone]

## 2015-04-25 NOTE — Discharge Instructions (Addendum)
Migraine Headache A migraine headache is an intense, throbbing pain on one or both sides of your head. A migraine can last for 30 minutes to several hours. CAUSES  The exact cause of a migraine headache is not always known. However, a migraine may be caused when nerves in the brain become irritated and release chemicals that cause inflammation. This causes pain. Certain things may also trigger migraines, such as:  Alcohol.  Smoking.  Stress.  Menstruation.  Aged cheeses.  Foods or drinks that contain nitrates, glutamate, aspartame, or tyramine.  Lack of sleep.  Chocolate.  Caffeine.  Hunger.  Physical exertion.  Fatigue.  Medicines used to treat chest pain (nitroglycerine), birth control pills, estrogen, and some blood pressure medicines. SIGNS AND SYMPTOMS  Pain on one or both sides of your head.  Pulsating or throbbing pain.  Severe pain that prevents daily activities.  Pain that is aggravated by any physical activity.  Nausea, vomiting, or both.  Dizziness.  Pain with exposure to bright lights, loud noises, or activity.  General sensitivity to bright lights, loud noises, or smells. Before you get a migraine, you may get warning signs that a migraine is coming (aura). An aura may include:  Seeing flashing lights.  Seeing bright spots, halos, or zigzag lines.  Having tunnel vision or blurred vision.  Having feelings of numbness or tingling.  Having trouble talking.  Having muscle weakness. DIAGNOSIS  A migraine headache is often diagnosed based on:  Symptoms.  Physical exam.  A CT scan or MRI of your head. These imaging tests cannot diagnose migraines, but they can help rule out other causes of headaches. TREATMENT Medicines may be given for pain and nausea. Medicines can also be given to help prevent recurrent migraines.  HOME CARE INSTRUCTIONS  Only take over-the-counter or prescription medicines for pain or discomfort as directed by your  health care provider. The use of long-term narcotics is not recommended.  Lie down in a dark, quiet room when you have a migraine.  Keep a journal to find out what may trigger your migraine headaches. For example, write down:  What you eat and drink.  How much sleep you get.  Any change to your diet or medicines.  Limit alcohol consumption.  Quit smoking if you smoke.  Get 7-9 hours of sleep, or as recommended by your health care provider.  Limit stress.  Keep lights dim if bright lights bother you and make your migraines worse. SEEK IMMEDIATE MEDICAL CARE IF:   Your migraine becomes severe.  You have a fever.  You have a stiff neck.  You have vision loss.  You have muscular weakness or loss of muscle control.  You start losing your balance or have trouble walking.  You feel faint or pass out.  You have severe symptoms that are different from your first symptoms. MAKE SURE YOU:   Understand these instructions.  Will watch your condition.  Will get help right away if you are not doing well or get worse. Document Released: 08/13/2005 Document Revised: 12/28/2013 Document Reviewed: 04/20/2013 [facility] Patient Information 2015 [place], [place]. This information is not intended to replace advice given to you by your health care provider. Make sure you discuss any questions you have with your health care provider.  Information on my medicine - ELIQUIS (apixaban)  This medication education was reviewed with me or my healthcare representative as part of my discharge preparation.  The pharmacist that spoke with me during my hospital stay was:  Randon Goldsmith,  Merrilee Jansky, [place]  Why was Eliquis prescribed for you? Eliquis was prescribed for you to reduce the risk of a blood clot forming that can cause a stroke if you have a medical condition called atrial fibrillation (a type of irregular heartbeat).  What do You need to know about Eliquis ? Take your Eliquis TWICE DAILY - one  tablet in the morning and one tablet in the evening with or without food. If you have difficulty swallowing the tablet whole please discuss with your pharmacist how to take the medication safely.  Take Eliquis exactly as prescribed by your doctor and DO NOT stop taking Eliquis without talking to the doctor who prescribed the medication.  Stopping may increase your risk of developing a stroke.  Refill your prescription before you run out.  After discharge, you should have regular check-up appointments with your healthcare provider that is prescribing your Eliquis.  In the future your dose may need to be changed if your kidney function or weight changes by a significant amount or as you get older.  What do you do if you miss a dose? If you miss a dose, take it as soon as you remember on the same day and resume taking twice daily.  Do not take more than one dose of ELIQUIS at the same time to make up a missed dose.  Important Safety Information A possible side effect of Eliquis is bleeding. You should call your healthcare provider right away if you experience any of the following: ? Bleeding from an injury or your nose that does not stop. ? Unusual colored urine (red or dark brown) or unusual colored stools (red or black). ? Unusual bruising for unknown reasons. ? A serious fall or if you hit your head (even if there is no bleeding).  Some medicines may interact with Eliquis and might increase your risk of bleeding or clotting while on Eliquis. To help avoid this, consult your healthcare provider or pharmacist prior to using any new prescription or non-prescription medications, including herbals, vitamins, non-steroidal anti-inflammatory drugs (NSAIDs) and supplements.  This website has more information on Eliquis (apixaban): http://www.eliquis.com/eliquis/home

## 2015-04-26 ENCOUNTER — Telehealth: Payer: Self-pay | Admitting: Neurology

## 2015-04-26 DIAGNOSIS — R29898 Other symptoms and signs involving the musculoskeletal system: Secondary | ICD-10-CM | POA: Insufficient documentation

## 2015-04-26 DIAGNOSIS — G43109 Migraine with aura, not intractable, without status migrainosus: Secondary | ICD-10-CM | POA: Insufficient documentation

## 2015-04-26 NOTE — Telephone Encounter (Signed)
Patient called stating she was in ED 04/22/15 with TIA. Dr Erlinda Hong notes states to see Dr Brett Fairy in 2 mths but cardiologist( Dr Lujean Amel) patient saw today is requesting she see provider ASAP as she is taking Eliquis and she is seizure patient and migraines also. Please call and advise when she can be seen. She can be reached at [phone].

## 2015-04-27 ENCOUNTER — Ambulatory Visit (INDEPENDENT_AMBULATORY_CARE_PROVIDER_SITE_OTHER): Payer: BC Managed Care – PPO | Admitting: Neurology

## 2015-04-27 ENCOUNTER — Encounter: Payer: Self-pay | Admitting: Neurology

## 2015-04-27 VITALS — BP 130/82 | HR 82 | Resp 20 | Ht 63.0 in | Wt 270.0 lb

## 2015-04-27 DIAGNOSIS — G43111 Migraine with aura, intractable, with status migrainosus: Secondary | ICD-10-CM | POA: Diagnosis not present

## 2015-04-27 DIAGNOSIS — G44329 Chronic post-traumatic headache, not intractable: Secondary | ICD-10-CM
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
## 2015-04-27 NOTE — Telephone Encounter (Signed)
Spoke to Dr. Brett Fairy who asked me to open a spot for pt today at 4:15. I called pt and she is agreeable to coming in this afternoon at 4:15. I advised pt to be here at 4:00 for a 4:15 appt. Pt verbalized understanding.

## 2015-04-27 NOTE — Patient Instructions (Signed)

## 2015-04-28 ENCOUNTER — Telehealth: Payer: Self-pay | Admitting: *Deleted

## 2015-04-28 DIAGNOSIS — Z0289 Encounter for other administrative examinations: Secondary | ICD-10-CM

## 2015-04-28 NOTE — Telephone Encounter (Signed)
Form complete, sent back to MR.

## 2015-04-28 NOTE — Telephone Encounter (Signed)
[name] received from [place] to [place] and Dr Brett Fairy 04/28/15.

## 2015-04-29 ENCOUNTER — Telehealth: Payer: Self-pay | Admitting: *Deleted

## 2015-04-29 NOTE — Telephone Encounter (Signed)
Form,[employer], [employer] received,completed by Dr Brett Fairy and Cyril Mourning faxed,mailed copy to patient 04/29/15.

## 2015-05-03 ENCOUNTER — Telehealth: Payer: Self-pay | Admitting: Neurology

## 2015-05-03 NOTE — Telephone Encounter (Signed)
[facility] [place] called and would like to speak with the nurse about the pt. They want to let us know the pt has stopped apixaban (ELIQUIS) 5 MG TABS tablet. Also wants to know about goin back to work .  [phone]- CMA; Ara Celi

## 2015-05-04 NOTE — Telephone Encounter (Signed)
Spoke with Ara Celi at [facility] and informed her that Dr. Brett Fairy is ok with patient stopping Eliquis and that she may return back to work.  Patient was also schedule for 3 month follow up appt.  Ara Celi verbalized understanding

## 2015-05-11 ENCOUNTER — Ambulatory Visit: Payer: BC Managed Care – PPO | Admitting: Anesthesiology

## 2015-05-18 ENCOUNTER — Ambulatory Visit: Payer: BC Managed Care – PPO | Admitting: Anesthesiology

## 2015-05-23 ENCOUNTER — Encounter: Payer: Self-pay | Admitting: Neurology

## 2015-05-23 ENCOUNTER — Ambulatory Visit (INDEPENDENT_AMBULATORY_CARE_PROVIDER_SITE_OTHER): Payer: BC Managed Care – PPO | Admitting: Neurology

## 2015-05-23 VITALS — BP 134/87 | HR 81 | Ht 63.0 in | Wt 262.0 lb

## 2015-05-23 DIAGNOSIS — G43711 Chronic migraine without aura, intractable, with status migrainosus: Secondary | ICD-10-CM | POA: Diagnosis not present

## 2015-05-23 NOTE — Progress Notes (Signed)
[facility]    Provider:  Dr Jaynee Eagles Referring Provider: [name], MD Primary Care Physician:  [name], MD  CC:  Migraines  HPI:  [name] [name] is a 49 y.o. female here as a referral from Dr. Kary Kos for Migraines and to discuss possible Botox treatment. 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 Migraines since a child at the age of 44. Migraines are all over the head, throbbing and pounding. She endorses light sensitivity, she lays in her room in the dark, staying still helps, smells trigger the migraine, she gets nausea and vomiting. She is having the headaches on average 20 headache days a month, over 15 a month are migrainous, for years. They last 24 hours or longer. She has had a migraines for 4 days now and is a 7-8/10. The pain can become 10/10. She has tried multiple medication. She has failed multiple medications including: amitriptyline/nortriptyline, topamax, depakote, zomig, imitrex, metoprolol, reglan, zofran, phenergan, imitrex,  she has been to multiple headache specialists and neurologists. Never had botox.   Discussed botox at length, expectations and side effects  . Review of Systems: Patient complains of symptoms per HPI as well as the following symptoms: weight loss, cough, cramps, headache, dizziness, tremor, change in appetite. Pertinent negatives per HPI. All others negative.   Social History   Social History  . Marital Status: Divorced    Spouse Name: N/A  . Number of Children: 2  . 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 Smoking status: Never Smoker   . Smokeless tobacco: Never Used  . Alcohol Use: 0.6 oz/week    1 Glasses of wine per week     Comment: occ glass of wine, "I haven't done that in a long time"  . Drug Use: No  . Sexual  Activity: Yes    Birth Control/ Protection: Surgical   Other Topics Concern  . Not on file   Social History Narrative   Patient is single with 2 children   Patient is right handed   Patient has some college   Patient does not drink caffeine          Family History  Problem Relation Age of Onset  . Hypertension Mother   . Depression Mother   . Heart attack Father   . Hypertension Father   . Emphysema Father   . COPD Father     Past Medical History  Diagnosis Date  . Migraine   . Seizures   . Asthma   . Obesity, Class III, BMI 40-49.9 (morbid obesity) 12/21/2012  . Shortness of breath dyspnea   . Anginal pain   . Anxiety   . Atrial fibrillation     Past Surgical History  Procedure Laterality Date  . Abdominal hysterectomy    . Cesarean section    . Esophagogastroduodenoscopy N/A 11/21/2012    Procedure: ESOPHAGOGASTRODUODENOSCOPY (EGD);  Surgeon: Irene Shipper, MD;  Location: [facility];  Service: Endoscopy;  Laterality: N/A;  . Colonoscopy N/A 11/21/2012    Procedure: COLONOSCOPY;  Surgeon: Irene Shipper, MD;  [facility];  Service: Endoscopy;  Laterality: N/A;  . Dilation and curettage of uterus    . Hernia repair  12/17/12    laparoscopic incisional   . Incisional hernia repair N/A 12/17/2012    Procedure: LAPAROSCOPIC INCISIONAL HERNIA;  Surgeon: Harl Bowie, MD;  Location: [facility];  Service: General;  Laterality: N/A;  . Insertion of mesh N/A 12/17/2012    Procedure: INSERTION OF MESH;  Surgeon: Harl Bowie, MD;  Location: [facility];  Service: General;  Laterality: N/A;  . Cardiac catheterization N/A 01/21/2015    Procedure: Left Heart Cath;  Surgeon: Yolonda Kida, MD;  Location: [facility];  Service: Cardiovascular;  Laterality: N/A;    Current Outpatient Prescriptions  Medication Sig Dispense Refill  . albuterol (PROVENTIL HFA;VENTOLIN HFA) 108 (90 BASE) MCG/ACT inhaler Inhale 2 puffs into the lungs every 6 (six) hours as  needed for wheezing.    Marland Kitchen aspirin EC 325 MG tablet Take 1 tablet (325 mg total) by mouth daily. (Patient taking differently: Take 81 mg by mouth daily. )  0  . clonazePAM (KLONOPIN) 1 MG tablet Take 1 mg by mouth at bedtime as needed (for sleep and if she feels a seizure coming on).    . lamoTRIgine (LAMICTAL) 100 MG tablet Take 1 tablet (100 mg total) by mouth 2 (two) times daily. 60 tablet 6  . metoprolol tartrate (LOPRESSOR) 25 MG tablet Take 1 tablet (25 mg total) by mouth 2 (two) times daily. 60 tablet 0  . omeprazole (PRILOSEC) 20 MG capsule Take 20 mg by mouth daily.    Marland Kitchen topiramate (TOPAMAX) 100 MG tablet Take 1 tablet (100 mg total) by mouth 2 (two) times daily. 60 tablet 6  . UNABLE TO FIND This note is to excuse Ms.Achee from work due to medical illness requiring hospitalization from 8/26 through 8/29, ok to return to work 9/1 1 each 0  . Vitamin D, Ergocalciferol, (DRISDOL) 50000 UNITS CAPS capsule Take 1 capsule by mouth once a week. Every tuesday  5   No current facility-administered medications for this visit.    Allergies as of 05/23/2015 - Review Complete 05/23/2015  Allergen Reaction Noted  . Amoxicillin Hives and Swelling 03/25/2011  . Biaxin [clarithromycin] Hives 03/03/2012  . Erythromycin base Hives 03/25/2011  . Penicillins Hives and Swelling 03/25/2011  . Zithromax [azithromycin] Hives and Nausea Only 03/25/2011    Vitals: BP 134/87 mmHg  Pulse 81  Ht 5\' 3"  (1.6 m)  Wt 262 lb (118.842 kg)  BMI 46.42 kg/m2 Last Weight:  Wt Readings from Last 1 Encounters:  05/23/15 262 lb (118.842 kg)   Last Height:   Ht Readings from Last 1 Encounters:  05/23/15 5\' 3"  (1.6 m)   Physical exam: Exam: Gen: NAD, conversant, well nourised, obese, well groomed                     CV: RRR, no MRG. No Carotid Bruits. No peripheral edema, warm, nontender Eyes: Conjunctivae clear without exudates or hemorrhage  Neuro: Detailed Neurologic Exam  Speech:    Speech is normal;  fluent and spontaneous with normal comprehension.  Cognition:    The patient is oriented to person, place, and time;  Cranial Nerves:    The pupils are equal, round, and reactive to light. The fundi are normal and spontaneous venous pulsations are present. Visual fields are full to finger confrontation. Extraocular movements are intact. Trigeminal sensation is intact and the muscles of mastication are normal. The face is symmetric. The palate elevates in the midline. Hearing intact. Voice is normal. Shoulder shrug is normal. The tongue has normal motion without fasciculations.     Assessment/Plan:  49 year old female with chronic migraines, without aura, intractable without status  migrainosus. She has tried and failed multiple medications including most 1st line migraine preventatives. Discussed Botox at length, expectations, procedure and side effects. I do recommend that patient try Botox given her history of chronic intractable migraines. Patient would like to read about it and let me now.  Sarina Ill, MD  [facility] [address] [place], [place] [address]  Phone [phone] Fax [phone]  A total of 30 minutes was spent face-to-face with this patient. Over half this time was spent on counseling patient on the migraine diagnosis and different diagnostic and therapeutic options available.

## 2015-05-24 NOTE — Progress Notes (Signed)
I agree with the assessment and plan as directed by Dr Jaynee Eagles, MD  .The patient is known to me .   DOHMEIER,CARMEN, MD

## 2015-06-01 ENCOUNTER — Emergency Department (HOSPITAL_COMMUNITY)
Admission: EM | Admit: 2015-06-01 | Discharge: 2015-06-02 | Disposition: A | Discharge status: Home / Self Care | Payer: BC Managed Care – PPO | Attending: Emergency Medicine | Admitting: Emergency Medicine

## 2015-06-01 ENCOUNTER — Encounter (HOSPITAL_COMMUNITY): Payer: Self-pay

## 2015-06-01 DIAGNOSIS — E669 Obesity, unspecified: Secondary | ICD-10-CM | POA: Diagnosis not present

## 2015-06-01 DIAGNOSIS — F419 Anxiety disorder, unspecified: Secondary | ICD-10-CM | POA: Insufficient documentation

## 2015-06-01 DIAGNOSIS — Z79899 Other long term (current) drug therapy: Secondary | ICD-10-CM | POA: Insufficient documentation

## 2015-06-01 DIAGNOSIS — Z880 Allergy status to penicillin: Secondary | ICD-10-CM | POA: Diagnosis not present

## 2015-06-01 DIAGNOSIS — R51 Headache: Secondary | ICD-10-CM

## 2015-06-01 DIAGNOSIS — Z7982 Long term (current) use of aspirin: Secondary | ICD-10-CM | POA: Insufficient documentation

## 2015-06-01 DIAGNOSIS — J45909 Unspecified asthma, uncomplicated: Secondary | ICD-10-CM | POA: Insufficient documentation

## 2015-06-01 DIAGNOSIS — G43909 Migraine, unspecified, not intractable, without status migrainosus: Secondary | ICD-10-CM | POA: Diagnosis not present

## 2015-06-01 DIAGNOSIS — I4891 Unspecified atrial fibrillation: Secondary | ICD-10-CM | POA: Diagnosis not present

## 2015-06-01 DIAGNOSIS — R519 Headache, unspecified: Secondary | ICD-10-CM

## 2015-06-01 DIAGNOSIS — Z9889 Other specified postprocedural states: Secondary | ICD-10-CM | POA: Diagnosis not present

## 2015-06-01 LAB — CBC WITH DIFFERENTIAL/PLATELET
Basophils Absolute: 0 10*3/uL (ref 0.0–0.1)
Basophils Relative: 0 %
EOS ABS: 0 10*3/uL (ref 0.0–0.7)
Eosinophils Relative: 1 %
HCT: 43 % (ref 36.0–46.0)
HEMOGLOBIN: 14 g/dL (ref 12.0–15.0)
LYMPHS ABS: 2.6 10*3/uL (ref 0.7–4.0)
Lymphocytes Relative: 31 %
MCH: 28.1 pg (ref 26.0–34.0)
MCHC: 32.6 g/dL (ref 30.0–36.0)
MCV: 86.2 fL (ref 78.0–100.0)
MONO ABS: 0.5 10*3/uL (ref 0.1–1.0)
MONOS PCT: 6 %
NEUTROS PCT: 62 %
Neutro Abs: 5.3 10*3/uL (ref 1.7–7.7)
Platelets: 176 10*3/uL (ref 150–400)
RBC: 4.99 MIL/uL (ref 3.87–5.11)
RDW: 14.8 % (ref 11.5–15.5)
WBC: 8.4 10*3/uL (ref 4.0–10.5)

## 2015-06-01 LAB — BASIC METABOLIC PANEL
Anion gap: 3 — ABNORMAL LOW (ref 5–15)
BUN: 9 mg/dL (ref 6–20)
CHLORIDE: 115 mmol/L — AB (ref 101–111)
CO2: 20 mmol/L — AB (ref 22–32)
CREATININE: 0.84 mg/dL (ref 0.44–1.00)
Calcium: 8.6 mg/dL — ABNORMAL LOW (ref 8.9–10.3)
GFR calc Af Amer: >60 mL/min (ref >60)
GFR calc non Af Amer: >60 mL/min (ref >60)
Glucose, Bld: 114 mg/dL — ABNORMAL HIGH (ref 65–99)
Potassium: 3.9 mmol/L (ref 3.5–5.1)
SODIUM: 138 mmol/L (ref 135–145)

## 2015-06-01 MED ORDER — SODIUM CHLORIDE 0.9 % IV BOLUS (SEPSIS)
1000.0000 mL | Freq: Once | INTRAVENOUS | Status: AC
  Administered 2015-06-01: 1000 mL via INTRAVENOUS

## 2015-06-01 MED ORDER — DIPHENHYDRAMINE HCL 50 MG/ML IJ SOLN
25.0000 mg | Freq: Once | INTRAMUSCULAR | Status: AC
  Administered 2015-06-01: 25 mg via INTRAVENOUS
  Filled 2015-06-01: qty 1

## 2015-06-01 MED ORDER — METOCLOPRAMIDE HCL 5 MG/ML IJ SOLN
10.0000 mg | Freq: Once | INTRAMUSCULAR | Status: AC
  Administered 2015-06-01: 10 mg via INTRAVENOUS
  Filled 2015-06-01: qty 2

## 2015-06-01 NOTE — ED Provider Notes (Signed)
CSN: [id number]     Arrival date & time 06/01/15  1853 History   First MD Initiated Contact with Patient 06/01/15 2153     Chief Complaint  Patient presents with  . Migraine  . Emesis   [name] [name] is a 49 y.o. 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 The patient reports frequent migraine headaches and reports this feels like her typical migraine headache. She complains of 10/10 generalized headache with associated nausea, vomiting, lightheadedness, blurry vision, photophobia and phonophobia. She reports these are all typical for her migraine headaches. She reports she has taken Excedrin Migraine without relief and vomited this back up. She reports she is followed by a neurologist at [facility]. The patient denies fevers, chills, recent illness, double vision, numbness, tingling, weakness, neck pain, neck stiffness, abdominal pain, diarrhea, urinary symptoms, hematemesis, hematochezia, or rashes.  (Consider location/radiation/quality/duration/timing/severity/associated sxs/prior Treatment) HPI  Past Medical History  Diagnosis Date  . Migraine   . Seizures ([place])   . Asthma   . Obesity, Class III, BMI 40-49.9 (morbid obesity) ([place]) 12/21/2012  . Shortness of breath dyspnea   . Anginal pain ([place])   . Anxiety   . Atrial fibrillation [facility])    Past Surgical History  Procedure Laterality Date  . Abdominal hysterectomy    . Cesarean section    . Esophagogastroduodenoscopy N/A 11/21/2012    Procedure: ESOPHAGOGASTRODUODENOSCOPY (EGD);  Surgeon: Irene Shipper, MD;  Location: [facility];  Service: Endoscopy;  Laterality: N/A;  . Colonoscopy N/A 11/21/2012    Procedure: COLONOSCOPY;  Surgeon: Irene Shipper, MD;  Location: [facility];  Service: Endoscopy;  Laterality: N/A;  .  Dilation and curettage of uterus    . Hernia repair  12/17/12    laparoscopic incisional   . Incisional hernia repair N/A 12/17/2012    Procedure: LAPAROSCOPIC INCISIONAL HERNIA;  Surgeon: Harl Bowie, MD;  Location: [place];  Service: General;  Laterality: N/A;  . Insertion of mesh N/A 12/17/2012    Procedure: INSERTION OF MESH;  Surgeon: Harl Bowie, MD;  Location: [place];  Service: General;  Laterality: N/A;  . Cardiac catheterization N/A 01/21/2015    Procedure: Left Heart Cath;  Surgeon: Yolonda Kida, MD;  Location: [place] [facility];  Service: Cardiovascular;  Laterality: N/A;   Family History  Problem Relation Age of Onset  . Hypertension Mother   . Depression Mother   . Heart attack Father   . Hypertension Father   . Emphysema Father   . COPD Father    Social History  Substance Use Topics  . Smoking status: Never Smoker   . Smokeless tobacco: Never Used  . Alcohol Use: 0.6 oz/week    1 Glasses of wine per week     Comment: occ glass of wine, "I haven't done that in a long time"   OB History    No data available     Review of Systems  Constitutional: Negative for fever and chills.  HENT: Negative for congestion and sore throat.   Eyes: Positive for photophobia and visual disturbance. Negative for pain.  Respiratory: Negative for cough, shortness of breath and wheezing.   Cardiovascular: Negative for chest pain and palpitations.  Gastrointestinal: Positive for nausea and vomiting. Negative for abdominal pain, diarrhea and blood in stool.  Genitourinary: Negative for dysuria,  hematuria and difficulty urinating.  Musculoskeletal: Negative for myalgias, back pain and neck pain.  Skin: Negative for rash and wound.  Neurological: Positive for light-headedness and headaches. Negative for seizures, syncope, weakness and numbness.      Allergies  Amoxicillin; Biaxin; Erythromycin base; Penicillins; and Zithromax  Home Medications   Prior to Admission  medications   Medication Sig Start Date End Date Taking? Authorizing Provider  albuterol (PROVENTIL HFA;VENTOLIN HFA) 108 (90 BASE) MCG/ACT inhaler Inhale 2 puffs into the lungs every 6 (six) hours as needed for wheezing.   Yes Historical Provider, MD  aspirin EC 325 MG tablet Take 1 tablet (325 mg total) by mouth daily. 04/25/15  Yes Domenic Polite, MD  clonazePAM (KLONOPIN) 1 MG tablet Take 1 mg by mouth at bedtime as needed (for sleep and if she feels a seizure coming on).   Yes Historical Provider, MD  lamoTRIgine (LAMICTAL) 100 MG tablet Take 1 tablet (100 mg total) by mouth 2 (two) times daily. 06/22/14  Yes Ward Givens, NP  metoprolol tartrate (LOPRESSOR) 25 MG tablet Take 1 tablet (25 mg total) by mouth 2 (two) times daily. 04/25/15  Yes Domenic Polite, MD  omeprazole (PRILOSEC) 20 MG capsule Take 20 mg by mouth daily.   Yes Historical Provider, MD  topiramate (TOPAMAX) 100 MG tablet Take 1 tablet (100 mg total) by mouth 2 (two) times daily. 02/02/15  Yes Larey Seat, MD  UNABLE TO FIND This note is to excuse Ms.Heiney from work due to medical illness requiring hospitalization from 8/26 through 8/29, ok to return to work 9/1 04/25/15   Domenic Polite, MD   BP 125/66 mmHg  Pulse 53  Temp(Src) 97.8 F (36.6 C) (Oral)  Resp 18  SpO2 99% Physical Exam  Constitutional: She is oriented to person, place, and time. She appears well-developed and well-nourished. No distress.  Nontoxic appearing obese female.  HENT:  Head: Normocephalic and atraumatic.  Right Ear: External ear normal.  Left Ear: External ear normal.  Mouth/Throat: Oropharynx is clear and moist. No oropharyngeal exudate.  No temporal edema or tenderness.  Eyes: Conjunctivae and EOM are normal. Pupils are equal, round, and reactive to light. Right eye exhibits no discharge. Left eye exhibits no discharge.  Neck: Normal range of motion. Neck supple. No JVD present. No tracheal deviation present.  Normal range of motion of  her neck. No meningeal signs.  Cardiovascular: Normal rate, regular rhythm, normal heart sounds and intact distal pulses.  Exam reveals no gallop and no friction rub.   No murmur heard. Pulmonary/Chest: Effort normal and breath sounds normal. No respiratory distress. She has no wheezes. She has no rales.  Lungs are clear to auscultation bilaterally.  Abdominal: Soft. Bowel sounds are normal. She exhibits no distension. There is no tenderness. There is no guarding.  Abdomen is soft and nontender to palpation.  Musculoskeletal: She exhibits no edema or tenderness.  No lower extremity edema or tenderness. Patient has 5 out of 5 strength in her bilateral upper and lower extremities.  Lymphadenopathy:    She has no cervical adenopathy.  Neurological: She is alert and oriented to person, place, and time. No cranial nerve deficit. Coordination normal.  The patient is alert and oriented 3. Cranial nerves are intact. Sensation intact to bilateral upper and lower extremities. No pronator drift. Finger to nose intact bilaterally. EOMs are intact bilaterally. Vision is grossly intact bilaterally. Speech is clear and coherent.   Skin: Skin is warm and dry. No rash noted. She is  not diaphoretic. No erythema. No pallor.  Psychiatric: She has a normal mood and affect. Her behavior is normal.  Nursing note and vitals reviewed.   ED Course  Procedures (including critical care time) Labs Review Labs Reviewed  BASIC METABOLIC PANEL - Abnormal; Notable for the following:    Chloride 115 (*)    CO2 20 (*)    Glucose, Bld 114 (*)    Calcium 8.6 (*)    Anion gap 3 (*)    All other components within normal limits  CBC WITH DIFFERENTIAL/PLATELET    Imaging Review No results found. I have personally reviewed and evaluated these lab results as part of my medical decision-making.   EKG Interpretation None      Filed Vitals:   06/01/15 1857 06/01/15 2215 06/02/15 0106  BP: 129/74 127/87 125/66  Pulse:  82 61 53  Temp: 97.8 F (36.6 C)    TempSrc: Oral    Resp: 18 20 18   SpO2: 96% 100% 99%     MDM   Meds given in ED:  Medications  sodium chloride 0.9 % bolus 1,000 mL (0 mLs Intravenous Stopped 06/02/15 0041)  metoCLOPramide (REGLAN) injection 10 mg (10 mg Intravenous Given 06/01/15 2241)  diphenhydrAMINE (BENADRYL) injection 25 mg (25 mg Intravenous Given 06/01/15 2242)  ketorolac (TORADOL) 30 MG/ML injection 15 mg (15 mg Intravenous Given 06/02/15 0103)  sodium chloride 0.9 % bolus 1,000 mL (1,000 mLs Intravenous New Bag/Given 06/02/15 0103)  LORazepam (ATIVAN) injection 1 mg (1 mg Intravenous Given 06/02/15 0103)    New Prescriptions   No medications on file    Final diagnoses:  Bad headache   This is a 49 y.o. 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 The patient reports frequent migraine headaches and reports this feels like her typical migraine headache.  Presentation is like pts typical headache and is not concerning for [facility], ICH, Meningitis, or temporal arteritis. Pt is afebrile with no focal neuro deficits, nuchal rigidity.  Will provide with fluid bolus, Reglan and Benadryl and reevaluate. At reevaluation the patient reports the migraine cocktail took the edge off and her headache is now down to an 8 out of 10. Will provide with second fluid bolus, Toradol and Ativan. Just after getting these meds I reevaluated the patient and she reports her headache is still an 8 out of 10 and that I have not given her enough time for the medicines to have worked. This is at shift change and I handed the the patient to Dr. Eulis Foster for disposition after reevaluation.       Waynetta Pean, PA-C 06/02/15 3235  Leo Grosser, MD 06/02/15 [phone]

## 2015-06-01 NOTE — ED Notes (Signed)
Pt c/o migraine and emesis x 2 days and dizziness starting today.  Pain score 10/10. +Blurred vision and light sensitivity.  Pt reports taking Excedrin migraine w/o relief.  Pt is followed by [facility].  Sts "they took me off all medications after my TIA."  Pt is unsure of TIA date.

## 2015-06-02 MED ORDER — SODIUM CHLORIDE 0.9 % IV BOLUS (SEPSIS)
1000.0000 mL | Freq: Once | INTRAVENOUS | Status: AC
  Administered 2015-06-02: 1000 mL via INTRAVENOUS

## 2015-06-02 MED ORDER — KETOROLAC TROMETHAMINE 30 MG/ML IJ SOLN
15.0000 mg | Freq: Once | INTRAMUSCULAR | Status: AC
  Administered 2015-06-02: 15 mg via INTRAVENOUS
  Filled 2015-06-02: qty 1

## 2015-06-02 MED ORDER — LORAZEPAM 2 MG/ML IJ SOLN
1.0000 mg | Freq: Once | INTRAMUSCULAR | Status: AC
  Administered 2015-06-02: 1 mg via INTRAVENOUS
  Filled 2015-06-02: qty 1

## 2015-06-02 NOTE — ED Provider Notes (Signed)
  Face-to-face evaluation   History: Patient states that she has had 11 headaches, like migraine, in the last 3 weeks. She last saw her headache physician, on 05/22/2015. At that time they discussed possible Botox treatments for persistent recurrent headaches. She was evaluated at 03:30 hours. She states that her headache is "better".   Physical exam: Obese, alert, cooperative. No dysarthria or aphasia.  Medical screening examination/treatment/procedure(s) were conducted as a shared visit with non-physician practitioner(s) and myself.  I personally evaluated the patient during the encounter  Daleen Bo, MD 06/03/15 [phone]

## 2015-06-02 NOTE — ED Notes (Signed)
Pt ambulatory to restroom without assistance, steady gait.  

## 2015-06-02 NOTE — Discharge Instructions (Signed)

## 2015-06-14 ENCOUNTER — Telehealth: Payer: Self-pay | Admitting: Neurology

## 2015-06-14 NOTE — Telephone Encounter (Signed)
Great - thanks

## 2015-06-14 NOTE — Telephone Encounter (Signed)
Patient has decided that she would like to try botox, could Dr. Jaynee Eagles fill out a consent form and I will start the verification process?

## 2015-06-14 NOTE — Telephone Encounter (Signed)
Dr. Jaynee Eagles-- I filled out form for [name] [name]. Do you want to look it over? Thank you!

## 2015-06-29 NOTE — Telephone Encounter (Signed)
Patient called to check status of Botox appointment. Patient advised of 3-4 week turnaround time. Patient states, we got the insurance company on the line that day and was told it was covered. I advised patient of turnaround time to submit to medication company then turnaround time for prior authorization with insurance company. Patient would like for Andee Poles to call her [phone].

## 2015-06-29 NOTE — Telephone Encounter (Signed)
Received patients authorization letters 11/01. Called patient and schedule apt.

## 2015-07-04 ENCOUNTER — Ambulatory Visit: Payer: Self-pay | Admitting: Neurology

## 2015-07-05 ENCOUNTER — Telehealth: Payer: Self-pay | Admitting: Neurology

## 2015-07-05 NOTE — Telephone Encounter (Signed)
[employer]  Called and relayed working on approval will call back with in 24 hours to give use a shipment date for Botox .

## 2015-07-13 NOTE — Telephone Encounter (Signed)
Spoke with pharmacy today, they stated that they had been trying to reach patient to confirm shipment. Called patient and gave her the phone number to the pharmacy. She stated that she would call today.

## 2015-07-14 NOTE — Telephone Encounter (Signed)
Patient's Botox Has arrived.

## 2015-07-19 ENCOUNTER — Ambulatory Visit (INDEPENDENT_AMBULATORY_CARE_PROVIDER_SITE_OTHER): Payer: BC Managed Care – PPO | Admitting: Neurology

## 2015-07-19 ENCOUNTER — Encounter: Payer: Self-pay | Admitting: Neurology

## 2015-07-19 VITALS — Ht 63.0 in | Wt 270.4 lb

## 2015-07-19 DIAGNOSIS — G43711 Chronic migraine without aura, intractable, with status migrainosus: Secondary | ICD-10-CM | POA: Diagnosis not present

## 2015-07-19 MED ORDER — BACLOFEN 10 MG PO TABS: 10.0000 mg | ORAL_TABLET | Freq: Three times a day (TID) | ORAL | Status: DC

## 2015-07-19 NOTE — Progress Notes (Signed)
Botox- 200 units total used (2-100 unit vials) Lot: [id number] Expiration: 01/2018 [id number]  0.9% Sodium Chloride- 17mL total used Lot: [id number] Expiration: 05/2016 [place]: [id number]

## 2015-07-19 NOTE — Progress Notes (Signed)
History: [name] [name] is a 49 y.o. female here as a referral from Dr. Kary Kos for Migraines and to discuss possible Botox treatment. 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 Migraines since a child at the age of 24. Migraines are all over the head, throbbing and pounding. She endorses light sensitivity, she lays in her room in the dark, staying still helps, smells trigger the migraine, she gets nausea and vomiting. She is having the headaches on average 20 headache days a month, over 15 a month are migrainous, for years. They last 24 hours or longer. She has had a migraines for 4 days now and is a 7-8/10. The pain can become 10/10. She has tried multiple medication. She has failed multiple medications including: amitriptyline/nortriptyline, topamax, depakote, zomig, imitrex, metoprolol, reglan, zofran, phenergan, imitrex, she has been to multiple headache specialists and neurologists. Never had botox.      Consent Form Botulism Toxin Injection For Chronic Migraine  Botulism toxin has been approved by the Federal drug administration for treatment of chronic migraine. Botulism toxin does not cure chronic migraine and it may not be effective in some patients.  The administration of botulism toxin is accomplished by injecting a small amount of toxin into the muscles of the neck and head. Dosage must be titrated for each individual. Any benefits resulting from botulism toxin tend to wear off after 3 months with a repeat injection required if benefit is to be maintained. Injections are usually done every 3-4 months with maximum effect peak achieved by about 2 or 3 weeks. Botulism toxin is expensive and you should be sure of what costs you will incur resulting from the injection.  The side effects of botulism toxin use for chronic migraine may include:   -Transient, and usually mild, facial weakness  with facial injections  -Transient, and usually mild, head or neck weakness with head/neck injections  -Reduction or loss of forehead facial animation due to forehead muscle              weakness  -Eyelid drooping  -Dry eye  -Pain at the site of injection or bruising at the site of injection  -Double vision  -Potential unknown long term risks  Contraindications: You should not have Botox if you are pregnant, nursing, allergic to albumin, have an infection, skin condition, or muscle weakness at the site of the injection, or have myasthenia gravis, Lambert-Eaton syndrome, or ALS.  It is also possible that as with any injection, there may be an allergic reaction or no effect from the medication. Reduced effectiveness after repeated injections is sometimes seen and rarely infection at the injection site may occur. All care will be taken to prevent these side effects. If therapy is given over a long time, atrophy and wasting in the muscle injected may occur. Occasionally the patient's become refractory to treatment because they develop antibodies to the toxin. In this event, therapy needs to be modified.  I have read the above information and consent to the administration of botulism toxin.  Signature on file  ______________  _____   _________________  Patient signature     Date   Witness signature       BOTOX PROCEDURE NOTE FOR MIGRAINE HEADACHE    Contraindications and precautions discussed with patient(above). Aseptic procedure was observed and patient tolerated procedure. Procedure performed by Dr. Georgia Dom  The condition has existed for more than 6 months, and pt does  not have a diagnosis of ALS, Myasthenia Gravis or Lambert-Eaton Syndrome. Risks and benefits of injections discussed and pt agrees to proceed with the procedure. Written consent obtained  These injections are medically necessary.  These injections do not cause sedations or hallucinations which the oral therapies may  cause.  Indication/Diagnosis: chronic migraine BOTOX(J0585) injection was performed according to protocol by [employer]. 200 units of BOTOX was dissolved into 4 cc NS.  NDC: [id number]  Botox- 200 units total used (2-100 unit vials)  Lot: [id number]  Expiration: 01/2018   [id number]  0.9% Sodium Chloride- 24mL total used  Lot: [id number]  Expiration: 05/2016  [facility]C: [id number]   Description of procedure:  The patient was placed in a sitting position. The standard protocol was used for Botox as follows, with 5 units of Botox injected at each site:   -Procerus muscle, midline injection  -Corrugator muscle, bilateral injection  -Frontalis muscle, bilateral injection, with 2 sites each side, medial injection was performed in the upper one third of the frontalis muscle, in the region vertical from the medial inferior edge of the superior orbital rim. The lateral injection was again in the upper one third of the forehead vertically above the lateral limbus of the cornea, 1.5 cm lateral to the medial injection site.  -Temporalis muscle injection, 4 sites, bilaterally. The first injection was 3 cm above the tragus of the ear, second injection site was 1.5 cm to 3 cm up from the first injection site in line with the tragus of the ear. The third injection site was 1.5-3 cm forward between the first 2 injection sites. The fourth injection site was 1.5 cm posterior to the second injection site.  -Occipitalis muscle injection, 3 sites, bilaterally. The first injection was done one half way between the occipital protuberance and the tip of the mastoid process behind the ear. The second injection site was done lateral and superior to the first, 1 fingerbreadth from the first injection. The third injection site was 1 fingerbreadth superiorly and medially from the first injection site.  -Cervical paraspinal muscle injection, 2 sites, bilateral knee first injection site was 1 cm from the midline of the  cervical spine, 3 cm inferior to the lower border of the occipital protuberance. The second injection site was 1.5 cm superiorly and laterally to the first injection site.  -Trapezius muscle injection was performed at 3 sites, bilaterally. The first injection site was in the upper trapezius muscle halfway between the inflection point of the neck, and the acromion. The second injection site was one half way between the acromion and the first injection site. The third injection was done between the first injection site and the inflection point of the neck.   Will return for repeat injection in 3 months.   200 units of Botox was used, 155 units were injected, the rest of the Botox was wasted. The patient tolerated the procedure well, there were no complications of the above procedure.

## 2015-07-20 ENCOUNTER — Encounter: Payer: Self-pay | Admitting: Neurology

## 2015-07-20 ENCOUNTER — Ambulatory Visit (INDEPENDENT_AMBULATORY_CARE_PROVIDER_SITE_OTHER): Payer: BC Managed Care – PPO | Admitting: Neurology

## 2015-07-20 VITALS — BP 122/80 | HR 78 | Resp 20 | Ht 62.5 in | Wt 262.0 lb

## 2015-07-20 DIAGNOSIS — S066X9A Traumatic subarachnoid hemorrhage with loss of consciousness of unspecified duration, initial encounter: Secondary | ICD-10-CM | POA: Insufficient documentation

## 2015-07-20 DIAGNOSIS — S069XAA Unspecified intracranial injury with loss of consciousness status unknown, initial encounter: Secondary | ICD-10-CM | POA: Insufficient documentation

## 2015-07-20 DIAGNOSIS — S069X9A Unspecified intracranial injury with loss of consciousness of unspecified duration, initial encounter: Secondary | ICD-10-CM | POA: Insufficient documentation

## 2015-07-20 DIAGNOSIS — S066X1A Traumatic subarachnoid hemorrhage with loss of consciousness of 30 minutes or less, initial encounter: Secondary | ICD-10-CM

## 2015-07-20 DIAGNOSIS — G43101 Migraine with aura, not intractable, with status migrainosus: Secondary | ICD-10-CM | POA: Insufficient documentation

## 2015-07-20 DIAGNOSIS — G40209 Localization-related (focal) (partial) symptomatic epilepsy and epileptic syndromes with complex partial seizures, not intractable, without status epilepticus: Secondary | ICD-10-CM

## 2015-07-20 DIAGNOSIS — S069X1S Unspecified intracranial injury with loss of consciousness of 30 minutes or less, sequela: Secondary | ICD-10-CM

## 2015-07-20 MED ORDER — SODIUM CHLORIDE 0.9 % IJ SOLN: 100.0000 [IU] | Freq: Once | INTRAMUSCULAR | Status: DC

## 2015-07-20 MED ORDER — LAMOTRIGINE 100 MG PO TABS: 100.0000 mg | ORAL_TABLET | Freq: Two times a day (BID) | ORAL | Status: DC

## 2015-07-20 NOTE — Patient Instructions (Signed)
Lamotrigine extended-release tablets What is this medicine? LAMOTRIGINE (la MOE Hendricks Limes) is used to control seizures in adults and children with epilepsy. This medicine may be used for other purposes; ask your health care provider or pharmacist if you have questions. What should I tell my health care provider before I take this medicine? They need to know if you have any of these conditions: -a history of depression or bipolar disorder -aseptic meningitis during prior use of lamotrigine -folate deficiency -kidney disease -liver disease -suicidal thoughts, plans, or attempt; a previous suicide attempt by you or a family member -an unusual or allergic reaction to lamotrigine or other seizure medicines, other medicines, foods, dyes, or preservatives -pregnant or trying to get pregnant -breast-feeding How should I use this medicine? Take this medicine by mouth with a glass of water. Follow the directions on the prescription label. Swallow these tablets whole. Do not chew, crush, or divide them. If this medicine upsets your stomach, take it with food or milk. Take your doses at regular intervals. Do not take your medicine more often than directed. A special MedGuide will be given to you by the pharmacist with each new prescription and refill. Be sure to read this information carefully each time. Talk to your pediatrician regarding the use of this medicine in children. While this drug may be prescribed for children as young as 2 years of age, precautions do apply. Overdosage: If you think you have taken too much of this medicine contact a poison control center or emergency room at once. NOTE: This medicine is only for you. Do not share this medicine with others. What if I miss a dose? If you miss a dose, take it as soon as you can. If it is almost time for your next dose, take only that dose. Do not take double or extra doses. What may interact with this medicine? -carbamazepine -female  hormones, including contraceptive or birth control pills -methotrexate -phenobarbital -phenytoin -primidone -pyrimethamine -rifampin -trimethoprim -valproic acid This list may not describe all possible interactions. Give your health care provider a list of all the medicines, herbs, non-prescription drugs, or dietary supplements you use. Also tell them if you smoke, drink alcohol, or use illegal drugs. Some items may interact with your medicine. What should I watch for while using this medicine? Visit your doctor or health care professional for regular checks on your progress. Wear a Probation officer or necklace. Carry an identification card with information about your condition, medicines, and doctor or health care professional. It is important to take this medicine exactly as directed. When first starting treatment, your dose will need to be adjusted slowly. It may take weeks or months before your dose is stable. You should contact your doctor or health care professional if your seizures get worse or if you have any new types of seizures. Do not stop taking this medicine unless instructed by your doctor or health care professional. Stopping your medicine suddenly can increase your seizures or their severity. Contact your doctor or health care professional right away if you develop a rash while taking this medicine. Rashes may be very severe and sometimes require treatment in the hospital. Deaths from rashes have occurred. Serious rashes occur more often in children than adults taking this medicine. It is more common for these serious rashes to occur during the first 2 months of treatment, but a rash can occur at any time. You may get drowsy, dizzy, or have blurred vision. Do not drive, use machinery,  or do anything that needs mental alertness until you know how this medicine affects you. To reduce dizzy or fainting spells, do not sit or stand up quickly, especially if you are an older patient.  Alcohol can increase drowsiness and dizziness. Avoid alcoholic drinks. The use of this medicine may increase the chance of suicidal thoughts or actions. Pay special attention to how you are responding while on this medicine. Any worsening of mood, or thoughts of suicide or dying should be reported to your health care professional right away. Your mouth may get dry. Chewing sugarless gum or sucking hard candy, and drinking plenty of water may help. Contact your doctor if the problem does not go away or is severe. Women who become pregnant while using this medicine may enroll in the [place] Pregnancy Registry by calling [phone]. This registry collects information about the safety of antiepileptic drug use during pregnancy. What side effects may I notice from receiving this medicine? Side effects you should report to your doctor or health care professional as soon as possible: -allergic reactions like skin rash, itching or hives, swelling of the face, lips, or tongue -blurred or double vision -difficulty walking or controlling muscle movements -fever -headache, stiff neck, and sensitivity to light -painful sores in the mouth, eyes, or nose -redness, blistering, peeling or loosening of the skin, including inside the mouth -severe muscle pain -swollen lymph glands -uncontrollable eye movements -unusual bruising or bleeding -unusually weak or tired -vomiting -worsening of mood, thoughts or actions of suicide or dying -yellowing of the eyes or skin Side effects that usually do not require medical attention (report to your doctor or health care professional if they continue or are bothersome): -diarrhea, or constipation -difficulty sleeping -nausea -tremors This list may not describe all possible side effects. Call your doctor for medical advice about side effects. You may report side effects to FDA at 1-800-FDA-1088. Where should I keep my medicine? Keep out of  reach of children. Store at room temperature between 15 and 30 degrees C (59 and 86 degrees F). Throw away any unused medicine after the expiration date. NOTE: This sheet is a summary. It may not cover all possible information. If you have questions about this medicine, talk to your doctor, pharmacist, or health care provider.    2016, Elsevier/Gold Standard. (2010-08-16 12:18:31)

## 2015-07-20 NOTE — Progress Notes (Signed)
[facility]    Provider:  Dr Jaynee Eagles Referring Provider: [name], MD Primary Care Physician:  [name] Pink, MD  CC:  Migraines  HPI:  [name] [name] is a 49 y.o. female here as a referral from Dr. Kary [name] for Migraines and to follow up on  Botox treatment.   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 Migraines since a child at the age of 40.  Migraines are all over the head, throbbing and pounding. She endorses light sensitivity, she lays in her room in the dark, staying still helps, smells trigger the migraine, she gets nausea and vomiting. She is having the headaches on average 20 headache days a month, over 15 a month are migrainous, for years. They last 24 hours or longer. She has had a migraines for 4 days now and is a 7-8/10. The pain can become 10/10. She has tried multiple medications.  [name] [name] has undergone  Botox injections - the first one- just yesterday with Dr. Jaynee Eagles.  These have helped significantly she is able to move her neck range of motion is not as limited the degree of headaches is much reduced and the frequency as well. She will need today some refills on her medication, namely lamotrigine. She also appears less depressed than when I saw her last.  She has failed multiple medications including: amitriptyline/nortriptyline, topamax, depakote, zomig, imitrex, metoprolol, reglan, zofran, phenergan, imitrex,  she has been to multiple headache specialists and neurologists. Never had botox.   Discussed botox at length, expectations and side effects, she has used Botox in shoulder , neck and occipital muscles. Forehead and temple.    Review of Systems: Patient complains of symptoms per HPI as well as the following symptoms: weight loss, cough, cramps, headache, dizziness, tremor, change in appetite. Pertinent negatives per HPI. All others  negative.   Social History   Social History  . Marital Status: Divorced    Spouse Name: N/A  . Number of Children: 2  . 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 Smoking status: Never Smoker   . Smokeless tobacco: Never Used  . Alcohol Use: 0.6 oz/week    1 Glasses of wine per week     Comment: occ glass of wine, "I haven't done that in a long time"  . Drug Use: No  . Sexual Activity: Yes    Birth Control/ Protection: Surgical   Other Topics Concern  . Not on file   Social History Narrative   Patient is single with 2 children   Patient is right handed   Patient has some college   Patient does not drink caffeine          Family History  Problem Relation Age of Onset  . Hypertension Mother   . Depression Mother   . Heart attack Father   . Hypertension Father   . Emphysema Father   . COPD Father     Past Medical History  Diagnosis Date  . Migraine   . Seizures ([place])   . Asthma   . Obesity, Class III, BMI 40-49.9 (morbid obesity) ([place]) 12/21/2012  . Shortness of breath dyspnea   . Anginal pain ([place])   . Anxiety   . Atrial fibrillation [facility])     Past Surgical History  Procedure Laterality Date  . Abdominal hysterectomy    .  Cesarean section    . Esophagogastroduodenoscopy N/A 11/21/2012    Procedure: ESOPHAGOGASTRODUODENOSCOPY (EGD);  Surgeon: Irene Shipper, MD;  Location: [facility];  Service: Endoscopy;  Laterality: N/A;  . Colonoscopy N/A 11/21/2012    Procedure: COLONOSCOPY;  Surgeon: Irene Shipper, MD;  Location: [facility];  Service: Endoscopy;  Laterality: N/A;  . Dilation and curettage of uterus    . Hernia repair  12/17/12    laparoscopic incisional   . Incisional hernia repair N/A 12/17/2012    Procedure: LAPAROSCOPIC INCISIONAL HERNIA;  Surgeon: Harl Bowie, MD;  Location: [facility];  Service: General;  Laterality: N/A;  . Insertion of mesh N/A  12/17/2012    Procedure: INSERTION OF MESH;  Surgeon: Harl Bowie, MD;  Location: [facility];  Service: General;  Laterality: N/A;  . Cardiac catheterization N/A 01/21/2015    Procedure: Left Heart Cath;  Surgeon: Yolonda Kida, MD;  Location: [place] [facility];  Service: Cardiovascular;  Laterality: N/A;    Current Outpatient Prescriptions  Medication Sig Dispense Refill  . albuterol (PROVENTIL HFA;VENTOLIN HFA) 108 (90 BASE) MCG/ACT inhaler Inhale 2 puffs into the lungs every 6 (six) hours as needed for wheezing.    Marland Kitchen aspirin EC 325 MG tablet Take 1 tablet (325 mg total) by mouth daily.  0  . baclofen (LIORESAL) 10 MG tablet Take 1 tablet (10 mg total) by mouth 3 (three) times daily. 90 each 6  . clonazePAM (KLONOPIN) 1 MG tablet Take 1 mg by mouth at bedtime as needed (for sleep and if she feels a seizure coming on).    . lamoTRIgine (LAMICTAL) 100 MG tablet Take 1 tablet (100 mg total) by mouth 2 (two) times daily. 60 tablet 6  . metoprolol tartrate (LOPRESSOR) 25 MG tablet Take 1 tablet (25 mg total) by mouth 2 (two) times daily. 60 tablet 0  . omeprazole (PRILOSEC) 20 MG capsule Take 20 mg by mouth daily.    Marland Kitchen topiramate (TOPAMAX) 100 MG tablet Take 1 tablet (100 mg total) by mouth 2 (two) times daily. 60 tablet 6  . UNABLE TO FIND This note is to excuse [name] from work due to medical illness requiring hospitalization from 8/26 through 8/29, ok to return to work 9/1 1 each 0   No current facility-administered medications for this visit.    Allergies as of 07/20/2015 - Review Complete 07/20/2015  Allergen Reaction Noted  . Amoxicillin Hives and Swelling 03/25/2011  . Biaxin [clarithromycin] Hives 03/03/2012  . Erythromycin base Hives 03/25/2011  . Penicillins Hives and Swelling 03/25/2011  . Zithromax [azithromycin] Hives and Nausea Only 03/25/2011    Vitals: BP 122/80 mmHg  Pulse 78  Resp 20  Ht 5' 2.5" (1.588 m)  Wt 262 lb (118.842 kg)  BMI 47.13 kg/m2 Last  Weight:  Wt Readings from Last 1 Encounters:  07/20/15 262 lb (118.842 kg)   Last Height:   Ht Readings from Last 1 Encounters:  07/20/15 5' 2.5" (1.588 m)   Physical exam: Exam: Gen: NAD, conversant, well nourised, obese, well groomed                     CV: RRR, no MRG. No Carotid Bruits. No peripheral edema, warm, nontender Eyes: Conjunctivae clear without exudates or hemorrhage  Neuro: Detailed Neurologic Exam  Speech:    Speech is normal; fluent and spontaneous with normal comprehension.  Cognition:    The patient is oriented to person, place, and time;  Cranial Nerves:  The pupils are equal, round, and reactive to light. The fundi are normal and spontaneous venous pulsations are present. Visual fields are full to finger confrontation. Extraocular movements are intact. Trigeminal sensation is intact and the muscles of mastication are normal.  The face is symmetric. The palate elevates in the midline. Hearing intact. Voice is normal. Shoulder shrug is normal. The tongue has normal motion without fasciculations.    Assessment/Plan:  49 year old female with chronic migraines, without aura, intractable without status migrainosus.  She has tried and failed multiple medications including most 1st line migraine preventatives. Received  Botox yesterday and feels already some improvement.  Her seizure disorder is well controlled, unfortunately with her traumatic brain injury last July 2015 posttraumatic headaches increased. She responded well to lamotrigine in the control of epilepsy. Her posttraumatic headaches were migrainous in nature. I hope that she will have a lasting benefit from the Botox therapy.  Larey Seat, MD  [facility] [address] [place], [place] [address]  Phone [phone] Fax [phone]  A total of 30 minutes was spent face-to-face with this patient. Over half this time was spent on counseling patient on the migraine  and epilepsy diagnosis and different diagnostic and therapeutic options available.Coordination of care with Dr. Jaynee Eagles, PCP [name] Pink, MD

## 2015-07-25 ENCOUNTER — Telehealth: Payer: Self-pay | Admitting: *Deleted

## 2015-07-25 ENCOUNTER — Ambulatory Visit: Payer: Self-pay | Admitting: Neurology

## 2015-07-25 ENCOUNTER — Other Ambulatory Visit: Payer: Self-pay | Admitting: Neurology

## 2015-07-25 MED ORDER — HYDROXYZINE HCL 10 MG PO TABS: 10.0000 mg | ORAL_TABLET | Freq: Three times a day (TID) | ORAL | Status: DC | PRN

## 2015-07-25 NOTE — Telephone Encounter (Signed)
Pt called office. She thinks she had an allergic reaction to botox. She woke up Saturday and whole forehead was covered with rash and lips swollen. Between her boobs was also a rash. Took benadryl but this did not help. She did not take any other new medication. Does not wash face with anything other than water. Has not changed detergent/hair products. Rash has gotten worse. Started w/ little fine bumps Saturday and her bumps are getting bigger. I advised I will give Dr Jaynee Eagles the message and call her back as soon as possible. She verbalized understanding.

## 2015-07-25 NOTE — Telephone Encounter (Signed)
Called pt back. Advised Dr Jaynee Eagles can call in oral prednisone. Pt advised she cannot take oral prednisone, it has to be IV. Made appt w/ Dr Jaynee Eagles at [facility] as requested. Knows to check-in at 350pm.

## 2015-07-25 NOTE — Telephone Encounter (Signed)
[name] [name] - can patient come in today? I would like to give her IV steroids if possible, and start her on prednisone oral. Thanks.

## 2015-07-25 NOTE — Telephone Encounter (Signed)
Dr Jaynee Eagles-- [name] [name] said she can probably do infusion at 4 when pt comes, [facility]

## 2015-08-15 NOTE — Telephone Encounter (Signed)
Called patient to schedule apt, she stated that she now longer wanted to receive botox.

## 2015-09-06 ENCOUNTER — Encounter: Payer: Self-pay | Admitting: Obstetrics and Gynecology

## 2015-09-07 ENCOUNTER — Ambulatory Visit: Payer: BC Managed Care – PPO | Attending: Anesthesiology | Admitting: Anesthesiology

## 2015-09-07 ENCOUNTER — Encounter: Payer: Self-pay | Admitting: Anesthesiology

## 2015-09-07 VITALS — BP 124/78 | HR 64 | Temp 97.9°F | Resp 18 | Ht 62.0 in | Wt 260.0 lb

## 2015-09-07 DIAGNOSIS — E669 Obesity, unspecified: Secondary | ICD-10-CM | POA: Insufficient documentation

## 2015-09-07 DIAGNOSIS — M549 Dorsalgia, unspecified: Secondary | ICD-10-CM | POA: Diagnosis present

## 2015-09-07 DIAGNOSIS — M5137 Other intervertebral disc degeneration, lumbosacral region: Secondary | ICD-10-CM

## 2015-09-07 DIAGNOSIS — M545 Low back pain, unspecified: Secondary | ICD-10-CM

## 2015-09-07 DIAGNOSIS — R51 Headache: Secondary | ICD-10-CM

## 2015-09-07 DIAGNOSIS — M5417 Radiculopathy, lumbosacral region: Secondary | ICD-10-CM

## 2015-09-07 DIAGNOSIS — M5116 Intervertebral disc disorders with radiculopathy, lumbar region: Secondary | ICD-10-CM | POA: Diagnosis not present

## 2015-09-07 DIAGNOSIS — R519 Headache, unspecified: Secondary | ICD-10-CM | POA: Insufficient documentation

## 2015-09-07 DIAGNOSIS — G8929 Other chronic pain: Secondary | ICD-10-CM | POA: Insufficient documentation

## 2015-09-07 MED ORDER — FENTANYL CITRATE (PF) 100 MCG/2ML IJ SOLN: 50.0000 ug | INTRAMUSCULAR | Status: DC

## 2015-09-07 MED ORDER — IOHEXOL 180 MG/ML  SOLN: 20.0000 mL | INTRAMUSCULAR | Status: DC | PRN

## 2015-09-07 MED ORDER — OXYCODONE-ACETAMINOPHEN 5-325 MG PO TABS: 1.0000 | ORAL_TABLET | Freq: Two times a day (BID) | ORAL | Status: DC

## 2015-09-07 MED ORDER — TRIAMCINOLONE ACETONIDE 40 MG/ML IJ SUSP
80.0000 mg | Freq: Once | INTRAMUSCULAR | Status: DC
Start: 2015-09-07 — End: 2016-10-09

## 2015-09-07 MED ORDER — BUPIVACAINE HCL (PF) 0.25 % IJ SOLN: 20.0000 mL | Freq: Once | INTRAMUSCULAR | Status: DC

## 2015-09-07 MED ORDER — MIDAZOLAM HCL 5 MG/5ML IJ SOLN
1.0000 mg | INTRAMUSCULAR | Status: DC
Start: 2015-09-07 — End: 2015-09-08

## 2015-09-07 NOTE — Patient Instructions (Signed)
You were given a prescription for Oxycodone today. 

## 2015-09-07 NOTE — Progress Notes (Signed)
   Subjective:    Patient ID: [name] [name], female    DOB: [birth year], 50 y.o.   MRN: [id number]  HPI  This patient has been managed by me for chronic back pain following an injury at [place] a lumbar facet block in August for her Recently she had a TIA and was treated by the neurologist She said she had a second fall at a department store [facility]) and this injury has caused a reactivation of her back pain. The pain is mainly confined to the lumbar sacral area  Review of Systems  Constitutional: Negative.   HENT: Negative.   Eyes: Negative.   Respiratory: Negative.   Cardiovascular: Negative.   Gastrointestinal: Negative.   Endocrine: Negative.   Genitourinary: Negative.   Musculoskeletal: Negative.   Skin: Negative.   Allergic/Immunologic: Negative.   Neurological: Negative.   Hematological: Negative.   Psychiatric/Behavioral: Negative.        Objective:   Physical Exam  Cardiovascular:  This patient appears to be in no distress That pressure is 124/78 mmHg Her pulse is 64 bpm Equal and regular Heart sounds 1 and 2 were heard in all areas There were no audible murmurs Temperature was 97.41F Respirations are 18 breaths per minute S PO2 was 98% Chest is clinically clear There were no adventitious sounds Abdomen is soft and nontender There was no palpable organomegaly There was no lymphadenopathy Pupils were equal and reve Cranial nerves were intact There no new neurological or musculoskeletal findings  Vitals reviewed.         Assessment & Plan:   Assessment 1 chronic low back pain 2 lumbar degenerative disc disease 3 lumbar radiculopathy 4 obesity   Plan of management Patient was offered a caudal epidural steroid injection but she refused to have the procedure She was given Percocet 5/325 one tablet twice a day when necessary for painand she was given 30 tablets Will follow up with her in 1 month   Established patient        Level  III    Lance Bosch M.D.

## 2015-09-08 ENCOUNTER — Ambulatory Visit (INDEPENDENT_AMBULATORY_CARE_PROVIDER_SITE_OTHER): Payer: BC Managed Care – PPO | Admitting: Obstetrics and Gynecology

## 2015-09-08 ENCOUNTER — Encounter: Payer: Self-pay | Admitting: Obstetrics and Gynecology

## 2015-09-08 VITALS — BP 135/88 | HR 78 | Ht 62.5 in | Wt 264.7 lb

## 2015-09-08 DIAGNOSIS — E8940 Asymptomatic postprocedural ovarian failure: Secondary | ICD-10-CM

## 2015-09-08 DIAGNOSIS — N898 Other specified noninflammatory disorders of vagina: Secondary | ICD-10-CM

## 2015-09-08 DIAGNOSIS — Z79890 Hormone replacement therapy (postmenopausal): Secondary | ICD-10-CM

## 2015-09-08 DIAGNOSIS — N393 Stress incontinence (female) (male): Secondary | ICD-10-CM

## 2015-09-08 DIAGNOSIS — E669 Obesity, unspecified: Secondary | ICD-10-CM

## 2015-09-08 DIAGNOSIS — R32 Unspecified urinary incontinence: Secondary | ICD-10-CM

## 2015-09-08 MED ORDER — ESTRADIOL 2 MG VA RING: 2.0000 mg | VAGINAL_RING | VAGINAL | Status: DC

## 2015-09-08 NOTE — Progress Notes (Signed)
Chief complaint: 1.  Vaginal discharge.  Patient presents for evaluation of copious watery discharge leak from the vagina that occurs periodically.  It can occur just after voiding episodes as well as at other times without recent voiding.  Patient reports that it does not have a urine like odor.  She denies fever, chills, sweats, UTI symptoms.  She does not report Stress incontinence or significant urgency symptoms.  She is status post TAH/BSO.  Past medical history, past surgical history, problems, medications, and allergies are reviewed.  Review of systems: Per HPI.  OBJECTIVE: BP 135/88 mmHg  Pulse 78  Ht 5' 2.5" (1.588 m)  Wt 264 lb 11.2 oz (120.067 kg)  BMI 47.61 kg/m2 Well-appearing obese African-American American female in no acute distress.  Alert and oriented. Abdomen: Protuberant with moderate pannus; midline incision healed without evidence of hernia; no masses palpable. Pelvic: External genitalia-normal BUS-normal. Vagina-first-degree cystocele; normal vaginal mucosa; intact vaginal cuff at the apex; minimal white secretions;No palpable anterior vaginal mucosa defects or tenderness; no evidence of urethral diverticulum; no leak of urine or discharge from urethra with palpation/milking of urethra Cervix-surgically absent Uterus-surgically absent. Adnexa-nonpalpable, nontender. Rectovaginal-normal.  External exam  PROCEDURE: Wet prep. Normal saline-negative. KOH-negative  ASSESSMENT: 1.  Watery vaginal discharge, unclear etiology, possibly urine leakage. 2.  Surgical menopause; patient requesting estrogen ring (will discontinue oral estradiol).  PLAN: 1.  Monitor symptomatology.  If patient has persistent watery discharge/drainage, we will refer to urology for assessment of urinary incontinence and rule out possible urethral diverticulum. 2.  Discontinue oral estradiol. 3.  Begin Estring intravaginal ring for ERT therapy  Brayton Mars, MD  Note: This  dictation was prepared with Dragon dictation along with smaller phrase technology. Any transcriptional errors that result from this process are unintentional.

## 2015-09-08 NOTE — Patient Instructions (Signed)
1.  Wet prep did not reveal any abnormality of vaginal secretions.  Suspected urine leakage. 2.  Return in 4 weeks for annual exam. 3.  If watery discharge persists, we will refer to urology for workup

## 2015-09-13 ENCOUNTER — Other Ambulatory Visit: Payer: Self-pay | Admitting: Family Medicine

## 2015-09-13 DIAGNOSIS — Z1231 Encounter for screening mammogram for malignant neoplasm of breast: Secondary | ICD-10-CM

## 2015-09-19 ENCOUNTER — Ambulatory Visit
Admission: RE | Admit: 2015-09-19 | Discharge: 2015-09-19 | Disposition: A | Discharge status: Home / Self Care | Payer: BC Managed Care – PPO | Source: Ambulatory Visit | Attending: Family Medicine | Admitting: Family Medicine

## 2015-09-19 ENCOUNTER — Ambulatory Visit: Payer: BC Managed Care – PPO

## 2015-09-19 DIAGNOSIS — Z1231 Encounter for screening mammogram for malignant neoplasm of breast: Secondary | ICD-10-CM | POA: Insufficient documentation

## 2015-10-06 ENCOUNTER — Encounter: Payer: Self-pay | Admitting: Obstetrics and Gynecology

## 2015-10-06 ENCOUNTER — Ambulatory Visit: Payer: BC Managed Care – PPO | Admitting: Anesthesiology

## 2015-10-06 ENCOUNTER — Ambulatory Visit (INDEPENDENT_AMBULATORY_CARE_PROVIDER_SITE_OTHER): Payer: BC Managed Care – PPO | Admitting: Obstetrics and Gynecology

## 2015-10-06 VITALS — BP 107/68 | HR 67 | Ht 63.0 in | Wt 270.5 lb

## 2015-10-06 DIAGNOSIS — Z90722 Acquired absence of ovaries, bilateral: Secondary | ICD-10-CM

## 2015-10-06 DIAGNOSIS — Z202 Contact with and (suspected) exposure to infections with a predominantly sexual mode of transmission: Secondary | ICD-10-CM | POA: Diagnosis not present

## 2015-10-06 DIAGNOSIS — Z01419 Encounter for gynecological examination (general) (routine) without abnormal findings: Secondary | ICD-10-CM

## 2015-10-06 DIAGNOSIS — Z9079 Acquired absence of other genital organ(s): Secondary | ICD-10-CM

## 2015-10-06 DIAGNOSIS — Z90710 Acquired absence of both cervix and uterus: Secondary | ICD-10-CM | POA: Diagnosis not present

## 2015-10-06 DIAGNOSIS — E8940 Asymptomatic postprocedural ovarian failure: Secondary | ICD-10-CM

## 2015-10-06 DIAGNOSIS — E669 Obesity, unspecified: Secondary | ICD-10-CM

## 2015-10-06 DIAGNOSIS — Z79890 Hormone replacement therapy (postmenopausal): Secondary | ICD-10-CM

## 2015-10-06 MED ORDER — NYSTATIN-TRIAMCINOLONE 100000-0.1 UNIT/GM-% EX OINT: 1.0000 "application " | TOPICAL_OINTMENT | Freq: Two times a day (BID) | CUTANEOUS | Status: DC

## 2015-10-06 NOTE — Progress Notes (Signed)
Patient ID: [name] [name], female   DOB: Jan 07, 1966, 50 y.o.   MRN: [id number] ANNUAL PREVENTATIVE CARE GYN  ENCOUNTER NOTE  Subjective:       [name] [name] is a 50 y.o. No obstetric history on file. female here for a routine annual gynecologic exam.  Current complaints: 1.  Vaginal itching   Gynecologic History No LMP recorded. Patient has had a hysterectomy. Contraception: status post hysterectomy Last Pap: no pap needed. Results were: normal Last mammogram: 08/2015. Results were: normal  Obstetric History OB History  No data available    Past Medical History  Diagnosis Date  . Seizures ([place])   . Asthma   . Obesity, Class III, BMI 40-49.9 (morbid obesity) ([place]) 12/21/2012  . Shortness of breath dyspnea   . Anginal pain ([place])   . Anxiety   . Atrial fibrillation ([place])   . Fibroid   . Ovarian cyst   . Back pain   . Migraine   . Surgical menopause   . Head injury   . TIA (transient ischemic attack)   . Back pain     bulging disk    Past Surgical History  Procedure Laterality Date  . Cesarean section    . Esophagogastroduodenoscopy N/A 11/21/2012    Procedure: ESOPHAGOGASTRODUODENOSCOPY (EGD);  Surgeon: Irene Shipper, MD;  Location: [facility];  Service: Endoscopy;  Laterality: N/A;  . Colonoscopy N/A 11/21/2012    Procedure: COLONOSCOPY;  Surgeon: Irene Shipper, MD;  Location: [facility];  Service: Endoscopy;  Laterality: N/A;  . Dilation and curettage of uterus    . Hernia repair  12/17/12    laparoscopic incisional   . Incisional hernia repair N/A 12/17/2012    Procedure: LAPAROSCOPIC INCISIONAL HERNIA;  Surgeon: Harl Bowie, MD;  Location: [facility];  Service: General;  Laterality: N/A;  . Insertion of mesh N/A 12/17/2012    Procedure: INSERTION OF MESH;  Surgeon: Harl Bowie, MD;  Location: [facility];  Service: General;  Laterality: N/A;  . Cardiac catheterization N/A 01/21/2015    Procedure: Left Heart Cath;  Surgeon: Yolonda Kida, MD;  Location: [facility];  Service: Cardiovascular;  Laterality: N/A;  . Abdominal hysterectomy      wiht bso    Current Outpatient Prescriptions on File Prior to Visit  Medication Sig Dispense Refill  . albuterol (PROVENTIL HFA;VENTOLIN HFA) 108 (90 BASE) MCG/ACT inhaler Inhale 2 puffs into the lungs every 6 (six) hours as needed for wheezing.    Marland Kitchen aspirin EC 325 MG tablet Take 1 tablet (325 mg total) by mouth daily.  0  . clonazePAM (KLONOPIN) 1 MG tablet Take 1 mg by mouth at bedtime as needed (for sleep and if she feels a seizure coming on).    . lamoTRIgine (LAMICTAL) 100 MG tablet Take 1 tablet (100 mg total) by mouth 2 (two) times daily. 60 tablet 6  . metoprolol tartrate (LOPRESSOR) 25 MG tablet Take 1 tablet (25 mg total) by mouth 2 (two) times daily. 60 tablet 0  . omeprazole (PRILOSEC) 20 MG capsule Take 20 mg by mouth daily.    . Vitamin D, Ergocalciferol, (DRISDOL) 50000 units CAPS capsule TAKE 1 CAPSULE BY MOUTH ONCE A WEEK.  5   Current Facility-Administered Medications on File Prior to Visit  Medication Dose Route Frequency Provider Last Rate Last Dose  . triamcinolone acetonide (KENALOG-40) injection 80 mg  80 mg Intramuscular Once Lance Bosch, MD        Allergies  Allergen Reactions  . Amoxicillin Hives and Swelling  . Biaxin [Clarithromycin] Hives  . Erythromycin Base Hives  . Penicillins Hives and Swelling  . Zithromax [Azithromycin] Hives and Nausea Only    Social History   Social History  . Marital Status: Divorced    Spouse Name: N/A  . Number of Children: 2  . 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 Smoking status: Never Smoker   . Smokeless tobacco: Never Used  . Alcohol Use: 0.6 oz/week    1 Glasses of wine per week     Comment: occ glass of wine, "I haven't done that in a long time"  . Drug Use: No  . Sexual Activity: Yes    Birth Control/ Protection:  Surgical   Other Topics Concern  . Not on file   Social History Narrative   Patient is single with 2 children   Patient is right handed   Patient has some college   Patient does not drink caffeine          Family History  Problem Relation Age of Onset  . Hypertension Mother   . Depression Mother   . Heart disease Mother   . Heart attack Father   . Hypertension Father   . Emphysema Father   . COPD Father   . Heart disease Father   . Cancer Neg Hx   . Breast cancer Cousin     The following portions of the patient's history were reviewed and updated as appropriate: allergies, current medications, past family history, past medical history, past social history, past surgical history and problem list.  Review of Systems ROS Review of Systems - General ROS: negative for - chills, fatigue, fever, hot flashes, night sweats, weight gain or weight loss Psychological ROS: negative for - anxiety, decreased libido, depression, mood swings, physical abuse or sexual abuse Ophthalmic ROS: negative for - blurry vision, eye pain or loss of vision ENT ROS: negative for - headaches, hearing change, visual changes or vocal changes Allergy and Immunology ROS: negative for - hives, itchy/watery eyes or seasonal allergies Hematological and Lymphatic ROS: negative for - bleeding problems, bruising, swollen lymph nodes or weight loss Endocrine ROS: negative for - galactorrhea, hair pattern changes, hot flashes, malaise/lethargy, mood swings, palpitations, polydipsia/polyuria, skin changes, temperature intolerance or unexpected weight changes Breast ROS: negative for - new or changing breast lumps or nipple discharge Respiratory ROS: negative for - cough or shortness of breath Cardiovascular ROS: negative for - chest pain, irregular heartbeat, palpitations or shortness of breath Gastrointestinal ROS: no abdominal pain, change in bowel habits, or black or bloody stools Genito-Urinary ROS: no dysuria,  trouble voiding, or hematuria.  POSITIVE-vaginal itching Musculoskeletal ROS: negative for - joint pain or joint stiffness Neurological ROS: negative for - bowel and bladder control changes Dermatological ROS: negative for rash and skin lesion changes   Objective:   BP 107/68 mmHg  Pulse 67  Ht 5\' 3"  (1.6 m)  Wt 270 lb 8 oz (122.698 kg)  BMI 47.93 kg/m2 CONSTITUTIONAL: Well-developed, well-nourished female in no acute distress.  PSYCHIATRIC: Normal mood and affect. Normal behavior. Normal judgment and thought content. [place]: Alert and oriented to person, place, and time. Normal muscle tone coordination. No cranial nerve deficit noted. HENT:  Normocephalic, atraumatic, External right and left ear normal. Oropharynx is clear and moist EYES: Conjunctivae and EOM are normal. Pupils are  equal, round, and reactive to light. No scleral icterus.  NECK: Normal range of motion, supple, no masses.  Normal thyroid.  SKIN: Skin is warm and dry. No rash noted. Not diaphoretic. No erythema. No pallor. CARDIOVASCULAR: Normal heart rate noted, regular rhythm, no murmur. RESPIRATORY: Clear to auscultation bilaterally. Effort and breath sounds normal, no problems with respiration noted. BREASTS: Symmetric in size. No masses, skin changes, nipple drainage, or lymphadenopathy. ABDOMEN: Soft, normal bowel sounds, no distention noted.  No tenderness, rebound or guarding.  BLADDER: Normal PELVIC:  External Genitalia: Normal  BUS: Normal  Vagina: Normal  Cervix: surgically absent  Uterus: surgically absent  Adnexa: Normal  RV: External Exam NormaI, No Rectal Masses and Normal Sphincter tone  MUSCULOSKELETAL: Normal range of motion. No tenderness.  No cyanosis, clubbing, or edema.  2+ distal pulses. LYMPHATIC: No Axillary, Supraclavicular, or Inguinal Adenopathy.    Assessment:   Annual gynecologic examination 50 y.o. Contraception: status post hysterectomy TAH/BSO [id number] Surgical  menopause  Plan:  Pap: Pap, Reflex if ASCUS, Pap Co Test, GC/CT NAAT, Not needed and Not done Mammogram: utd Stool Guaiac Testing:  Not Indicated Labs: vit d tsh fbs a1c lipid- std panel per pt request Routine preventative health maintenance measures emphasized: Exercise/Diet/Weight control, Tobacco Warnings and Alcohol/Substance use risks Estring 2 mg every 3 months intravaginal Return to [place], CMA  Brayton Mars, MD  Note: This dictation was prepared with Dragon dictation along with smaller phrase technology. Any transcriptional errors that result from this process are unintentional.

## 2015-10-06 NOTE — Patient Instructions (Signed)
1.  No Pap smear necessary. 2.  Mammogram already done this year. 3.  Begin Estring 2 mg intravaginal ring every 3 months.  Possible generic alternative will be chosen if available. 4.  Continue calcium with vitamin D supplementation daily 5.  Continue with healthy eating and exercise. 6.  Return in 1 year or as needed

## 2015-10-12 ENCOUNTER — Other Ambulatory Visit: Payer: BC Managed Care – PPO

## 2015-10-14 LAB — HEMOGLOBIN A1C
ESTIMATED AVERAGE GLUCOSE: 114 mg/dL
Hgb A1c MFr Bld: 5.6 % (ref 4.8–5.6)

## 2015-10-14 LAB — HSV(HERPES SIMPLEX VRS) I + II AB-IGG
HSV 1 GLYCOPROTEIN G AB, IGG: 28.8 {index} — AB (ref 0.00–0.90)
HSV 2 Glycoprotein G Ab, IgG: <0.91 index (ref 0.00–0.90)

## 2015-10-14 LAB — LIPID PANEL
CHOL/HDL RATIO: 3.4 ratio (ref 0.0–4.4)
Cholesterol, Total: 161 mg/dL (ref 100–199)
HDL: 47 mg/dL (ref >39)
LDL Calculated: 100 mg/dL — ABNORMAL HIGH (ref 0–99)
Triglycerides: 71 mg/dL (ref 0–149)
VLDL Cholesterol Cal: 14 mg/dL (ref 5–40)

## 2015-10-14 LAB — VITAMIN D 25 HYDROXY (VIT D DEFICIENCY, FRACTURES): VIT D 25 HYDROXY: 25.6 ng/mL — AB (ref 30.0–100.0)

## 2015-10-14 LAB — HIV ANTIBODY (ROUTINE TESTING W REFLEX): HIV SCREEN 4TH GENERATION: NONREACTIVE

## 2015-10-14 LAB — HEPATITIS B SURFACE ANTIGEN: HEP B S AG: NEGATIVE

## 2015-10-14 LAB — TSH: TSH: 0.736 u[IU]/mL (ref 0.450–4.500)

## 2015-10-14 LAB — RPR: RPR Ser Ql: NONREACTIVE

## 2015-10-14 LAB — HEPATITIS C ANTIBODY

## 2015-10-14 LAB — GLUCOSE, RANDOM: Glucose: 105 mg/dL — ABNORMAL HIGH (ref 65–99)

## 2015-11-30 ENCOUNTER — Encounter: Payer: Self-pay | Admitting: Anesthesiology

## 2015-11-30 ENCOUNTER — Ambulatory Visit: Payer: BC Managed Care – PPO | Attending: Anesthesiology | Admitting: Anesthesiology

## 2015-11-30 VITALS — BP 126/72 | HR 95 | Temp 98.8°F | Resp 16 | Ht 63.0 in | Wt 262.0 lb

## 2015-11-30 DIAGNOSIS — G8929 Other chronic pain: Secondary | ICD-10-CM | POA: Diagnosis not present

## 2015-11-30 DIAGNOSIS — M546 Pain in thoracic spine: Secondary | ICD-10-CM | POA: Diagnosis present

## 2015-11-30 DIAGNOSIS — E669 Obesity, unspecified: Secondary | ICD-10-CM | POA: Diagnosis not present

## 2015-11-30 DIAGNOSIS — M545 Low back pain, unspecified: Secondary | ICD-10-CM

## 2015-11-30 DIAGNOSIS — M5116 Intervertebral disc disorders with radiculopathy, lumbar region: Secondary | ICD-10-CM | POA: Insufficient documentation

## 2015-11-30 DIAGNOSIS — M5417 Radiculopathy, lumbosacral region: Secondary | ICD-10-CM

## 2015-11-30 DIAGNOSIS — M5137 Other intervertebral disc degeneration, lumbosacral region: Secondary | ICD-10-CM

## 2015-11-30 MED ORDER — GABAPENTIN 300 MG PO CAPS: 300.0000 mg | ORAL_CAPSULE | Freq: Three times a day (TID) | ORAL | Status: DC

## 2015-11-30 NOTE — Progress Notes (Signed)
Safety precautions to be maintained throughout the outpatient stay will include: orient to surroundings, keep bed in low position, maintain call bell within reach at all times, provide assistance with transfer out of bed and ambulation.  

## 2015-11-30 NOTE — Patient Instructions (Signed)
A prescription for Gabapentin was sent to your pharmacy. 

## 2015-11-30 NOTE — Progress Notes (Signed)
   Subjective:    Patient ID: [name] [name], female    DOB: 17-Feb-1966, 50 y.o.   MRN: [id number]  HPI  This patient returns to the clinic today indicating that she continues to have her back pain She indicates that on his new about her back pain is that is associated with burning pain in the lumbar sacral area radiating into her hips and down her thighs and into her legs. She had refused all nerve block and interventions and the Percocet she had been given before was not effective in controlling this It appears that there may be a neuropathic component to her pain  Review of Systems  Constitutional: Negative.   HENT: Negative.   Eyes: Negative.   Respiratory: Negative.   Cardiovascular: Negative.   Gastrointestinal: Negative.   Endocrine: Negative.   Genitourinary: Negative.   Musculoskeletal: Positive for back pain and arthralgias. Negative for myalgias, joint swelling, gait problem, neck pain and neck stiffness.  Skin: Negative.   Allergic/Immunologic: Negative.   Neurological: Negative.   Hematological: Negative.   Psychiatric/Behavioral: Negative.        Objective:   Physical Exam  Cardiovascular:  This patient appears quite comfortable and is in no apparent distress Blood pressure is 126/72 mmHg Her pulse is 95 bpm Equal and regular Heart sounds 1 and 2 were heard in all areas There were no audible murmurs Temperature is 98.84F Respirations are 16 breaths per minute SPO2 was 98% Chest is clinically clear There are no adventitious sounds Patient's weight is 262 pounds which is excessive given her height of 5 feet 3 inches Pupils are equal and reactive Cranial nerves are intact There are no new neurological no musculoskeletal findings  Nursing note and vitals reviewed.         Assessment & Plan:   Assessment 1 chronic low back pain 2 lumbar degenerative disc disease 3 lumbar radiculopathy 4 obesity 5 frequent falls   Plan of management 1 Will  continue to withhold all interventions and will discontinue the Percocet 2 Will begin her on gabapentin 300 mg 3 times a day given 90 pills and one refill 3 Will follow-up with her in 2 months   Established patient      level III    Lance Bosch M.D.

## 2016-01-04 ENCOUNTER — Other Ambulatory Visit (INDEPENDENT_AMBULATORY_CARE_PROVIDER_SITE_OTHER): Payer: BC Managed Care – PPO

## 2016-01-04 ENCOUNTER — Ambulatory Visit (INDEPENDENT_AMBULATORY_CARE_PROVIDER_SITE_OTHER): Payer: BC Managed Care – PPO | Admitting: Physician Assistant

## 2016-01-04 ENCOUNTER — Encounter: Payer: Self-pay | Admitting: Physician Assistant

## 2016-01-04 VITALS — BP 124/78 | HR 80 | Ht 63.0 in | Wt 274.4 lb

## 2016-01-04 DIAGNOSIS — R140 Abdominal distension (gaseous): Secondary | ICD-10-CM | POA: Diagnosis not present

## 2016-01-04 DIAGNOSIS — K625 Hemorrhage of anus and rectum: Secondary | ICD-10-CM | POA: Diagnosis not present

## 2016-01-04 DIAGNOSIS — R194 Change in bowel habit: Secondary | ICD-10-CM

## 2016-01-04 DIAGNOSIS — R1084 Generalized abdominal pain: Secondary | ICD-10-CM | POA: Diagnosis not present

## 2016-01-04 LAB — COMPREHENSIVE METABOLIC PANEL
ALT: 11 U/L (ref 0–35)
AST: 11 U/L (ref 0–37)
Albumin: 4 g/dL (ref 3.5–5.2)
Alkaline Phosphatase: 94 U/L (ref 39–117)
BILIRUBIN TOTAL: 0.4 mg/dL (ref 0.2–1.2)
BUN: 13 mg/dL (ref 6–23)
CALCIUM: 9 mg/dL (ref 8.4–10.5)
CO2: 25 meq/L (ref 19–32)
CREATININE: 0.9 mg/dL (ref 0.40–1.20)
Chloride: 110 mEq/L (ref 96–112)
GFR: 85.58 mL/min (ref >60.00)
Glucose, Bld: 100 mg/dL — ABNORMAL HIGH (ref 70–99)
Potassium: 4.1 mEq/L (ref 3.5–5.1)
Sodium: 141 mEq/L (ref 135–145)
TOTAL PROTEIN: 7.5 g/dL (ref 6.0–8.3)

## 2016-01-04 LAB — CBC WITH DIFFERENTIAL/PLATELET
BASOS ABS: 0 10*3/uL (ref 0.0–0.1)
Basophils Relative: 0.3 % (ref 0.0–3.0)
EOS ABS: 0.1 10*3/uL (ref 0.0–0.7)
Eosinophils Relative: 1 % (ref 0.0–5.0)
HEMATOCRIT: 39.9 % (ref 36.0–46.0)
HEMOGLOBIN: 13.5 g/dL (ref 12.0–15.0)
LYMPHS PCT: 43.5 % (ref 12.0–46.0)
Lymphs Abs: 3 10*3/uL (ref 0.7–4.0)
MCHC: 33.8 g/dL (ref 30.0–36.0)
MCV: 82.2 fl (ref 78.0–100.0)
Monocytes Absolute: 0.4 10*3/uL (ref 0.1–1.0)
Monocytes Relative: 5.9 % (ref 3.0–12.0)
NEUTROS ABS: 3.4 10*3/uL (ref 1.4–7.7)
Neutrophils Relative %: 49.3 % (ref 43.0–77.0)
PLATELETS: 222 10*3/uL (ref 150.0–400.0)
RBC: 4.85 Mil/uL (ref 3.87–5.11)
RDW: 14.9 % (ref 11.5–15.5)
WBC: 6.9 10*3/uL (ref 4.0–10.5)

## 2016-01-04 MED ORDER — LORAZEPAM 0.5 MG PO TABS: ORAL_TABLET | ORAL | Status: DC

## 2016-01-04 NOTE — Patient Instructions (Addendum)
Please go to the basement level to have your labs drawn.  Take Miralax 17 grams in 8 oz of water daily.   We have given you a prescription for Ativan to take to your pharmacy.   You have been scheduled for a CT scan of the abdomen and pelvis at [facility] ([address]---this is in the same building as [facility]).   You are scheduled on 01-05-2016 Thursday at 10:30 am. You should arrive 15 minutes prior to your appointment time for registration. Please follow the written instructions below on the day of your exam:  WARNING: IF YOU ARE ALLERGIC TO IODINE/X-RAY DYE, PLEASE NOTIFY RADIOLOGY IMMEDIATELY AT [phone]! YOU WILL BE GIVEN A 13 HOUR PREMEDICATION PREP.  1) Do not eat  anything after 6:30 am (4 hours prior to your test) 2) You have been given 2 bottles of oral contrast to drink. The solution may taste better if refrigerated, but do NOT add ice or any other liquid to this solution. Shake well before drinking.    Drink 1 bottle of contrast @ 8:30 am  (2 hours prior to your exam)  Drink 1 bottle of contrast @ 9:30 am (1 hour prior to your exam)  You may take any medications as prescribed with a small amount of water except for the following: Metformin, Glucophage, Glucovance, Avandamet, Riomet, Fortamet, Actoplus Met, Janumet, Glumetza or Metaglip. The above medications must be held the day of the exam AND 48 hours after the exam.  The purpose of you drinking the oral contrast is to aid in the visualization of your intestinal tract. The contrast solution may cause some diarrhea. Before your exam is started, you will be given a small amount of fluid to drink. Depending on your individual set of symptoms, you may also receive an intravenous injection of x-ray contrast/dye. Plan on being at [facility] for 30 minutes or long, depending on the type of exam you are having performed.  If you have any questions regarding your exam or if you need to reschedule,  you may call the CT department at [phone] between the hours of 8:00 am and 5:00 pm, Monday-Friday.  ________________________________________________________________________

## 2016-01-04 NOTE — Progress Notes (Signed)
Patient ID: [name] [name], female   DOB: 05-17-1966, 50 y.o.   MRN: [id number]   Subjective:    Patient ID: [name] [name], female    DOB: 11/24/1965, 50 y.o.   MRN: [id number]  HPI  [name] [name] is a pleasant 50 year old African-American female known to Dr. Sharlett Iles previously  from prior colonoscopy and EGD done in 2014. These were done for complaints of abdominal pain and both were normal exams.   Patient has multiple medical problems including a traumatic brain injury in July 2015 with posttraumatic chronic headaches, daughter, history of TIA in September 2016, atrial fibrillation, chronic back pain, seizure disorder and morbid obesity. Patient says she has been having ongoing abdominal discomfort over the past couple of months. She complains of persistent swelling and distention which will not resolve. Says she is uncomfortable at night and can't sleep on either side or on her stomach due to abdominal discomfort. She hurts during the day but seems more annoying at night because she can't get comfortable. She has some increase in discomfort postprandially and says she fills up fairly quickly as well. Bowel movements have been about every other day usually and now is having a bowel movement every 6-7 days. She has seen occasional blood on the tissue and occasionally mixed in with her bowel movement recently. There is nausea without vomiting. She has gained about 12 pounds since the symptoms started. No fever or chills She is status post ventral hernia repair 2 and hysterectomy with oophorectomy.  Review of Systems Pertinent positive and negative review of systems were noted in the above HPI section.  All other review of systems was otherwise negative.  Outpatient Encounter Prescriptions as of 01/04/2016  Medication Sig  . albuterol (PROVENTIL HFA;VENTOLIN HFA) 108 (90 BASE) MCG/ACT inhaler Inhale 2 puffs into the lungs every 6 (six) hours as needed for wheezing.  Marland Kitchen aspirin EC 325 MG tablet Take 1 tablet  (325 mg total) by mouth daily.  . clonazePAM (KLONOPIN) 1 MG tablet Take 1 mg by mouth at bedtime as needed (for sleep and if she feels a seizure coming on).  . lamoTRIgine (LAMICTAL) 100 MG tablet Take 1 tablet (100 mg total) by mouth 2 (two) times daily.  . metoprolol tartrate (LOPRESSOR) 25 MG tablet Take 1 tablet (25 mg total) by mouth 2 (two) times daily.  Marland Kitchen omeprazole (PRILOSEC) 20 MG capsule Take 20 mg by mouth daily.  Marland Kitchen topiramate (TOPAMAX) 100 MG tablet Take 100 mg by mouth 2 (two) times daily.  . Vitamin D, Ergocalciferol, (DRISDOL) 50000 units CAPS capsule TAKE 1 CAPSULE BY MOUTH ONCE A WEEK.  Marland Kitchen LORazepam (ATIVAN) 0.5 MG tablet Take 1/2 tablet before scan.  . [DISCONTINUED] chlorpheniramine-HYDROcodone ([place]) 10-8 MG/5ML SUER Reported on 11/30/2015  . [DISCONTINUED] fluticasone (FLONASE) 50 MCG/ACT nasal spray Place into the nose.  . [DISCONTINUED] gabapentin (NEURONTIN) 300 MG capsule Take 1 capsule (300 mg total) by mouth 3 (three) times daily.  . [DISCONTINUED] hydrOXYzine (ATARAX/VISTARIL) 10 MG tablet Reported on 11/30/2015  . [DISCONTINUED] nystatin-triamcinolone ointment (MYCOLOG) Apply 1 application topically 2 (two) times daily. (Patient not taking: Reported on 11/30/2015)   Facility-Administered Encounter Medications as of 01/04/2016  Medication  . triamcinolone acetonide (KENALOG-40) injection 80 mg   Allergies  Allergen Reactions  . Amoxicillin Hives and Swelling  . Biaxin [Clarithromycin] Hives  . Erythromycin Base Hives  . Penicillins Hives and Swelling  . Zithromax [Azithromycin] Hives and Nausea Only   Patient Active Problem List   Diagnosis Date Noted  .  Surgical menopause on hormone replacement therapy 09/08/2015  . Incontinence in female 09/08/2015  . Back pain at L4-L5 level 09/07/2015  . DDD (degenerative disc disease), lumbosacral 09/07/2015  . Lumbosacral radiculopathy 09/07/2015  . Migraine with aura and with status migrainosus, not intractable  07/20/2015  . Subarachnoid hemorrhage following injury, with loss of consciousness ([place]) 07/20/2015  . TBI (traumatic brain injury) ([place]) 07/20/2015  . Partial symptomatic epilepsy with complex partial seizures, not intractable, without status epilepticus ([place]) 07/20/2015  . Intractable migraine with aura with status migrainosus 04/27/2015  . Chronic post-traumatic headache, not intractable 04/27/2015  . Morbid obesity ([place])   . TIA (transient ischemic attack) 04/25/2015  . Paresthesia 04/22/2015  . CVA (cerebral infarction) 04/22/2015  . Right sided weakness 04/22/2015  . Seizure disorder ([place]) 04/22/2015  . Chronic headaches 04/22/2015  . Atrial fibrillation ([place]) 04/22/2015  . Right arm weakness   . Asthma, mild intermittent   . History of recent fall 03/09/2014  . 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 Obesity, Class III, BMI 40-49.9 (morbid obesity) ([place]) 12/21/2012  . GERD (gastroesophageal reflux disease)   . Incisional hernia, without obstruction or gangrene 11/06/2012  . Status post TAH-BSO 10/09/2003   Social History   Social History  . Marital Status: Divorced    Spouse Name: N/A  . Number of Children: 2  . 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 Smoking status: Never Smoker   . Smokeless tobacco: Never Used  . Alcohol Use: 0.0 oz/week    0 Glasses of wine per week  . Drug Use: No  . Sexual Activity: Yes    Birth Control/ Protection: Surgical   Other Topics Concern  . Not on file   Social History Narrative   Patient is single with 2 children   Patient is right handed   Patient has some college   Patient does not drink caffeine          Ms. Marton's family history includes Breast cancer in her cousin; COPD in her father; Depression in her mother; Emphysema in her father; Heart attack in her father; Heart disease in her father and mother;  Hypertension in her father and mother. There is no history of Cancer, Colon cancer, Esophageal cancer, Stomach cancer, Pancreatic cancer, or Liver disease.      Objective:    Filed Vitals:   01/04/16 0850  BP: 124/78  Pulse: 80    Physical Exam well-developed African-American female in no acute distress, pleasant blood pressure 124/78 pulse 80 height 5 foot 3 weight 274. HEENT; nontraumatic normocephalic EOMI PERRLA sclera anicteric, Cardiovascular; regular rate and rhythm with S1-S2 no murmur rub or gallop, Pulmonary ;clear bilaterally, Abdomen ;obese soft she has tenderness other diffusely and bilaterally there is no guarding or rebound no palpable mass or hepatosplenomegaly and no appreciable fluid wave bowel sounds are present upper abdominal incisional scar, Rectal; exam Brown stool trace Hemoccult-positive, Extremities; no clubbing cyanosis or edema skin warm and dry, Neuropsych; mood and affect appropriate     Assessment & Plan:   #49 50 year old African-American female with 2 month history of persistent abdominal bloating and distention, change in bowel habits with less frequent stools intermittent hematochezia nausea and weight gain. Stool is trace heme positive Etiology is not clear, will need to rule out underlying malignancy, ascites and or other intra-abdominal inflammatory process #2 morbid obesity-BMI 48.6 #3 history  of traumatic brain injury and chronic headaches #4 history of atrial fibrillation #5 history of TIA #6 seizure disorder #7 of back pain #8 s/p ventral hernia repair, hysterectomy and BSO  Plan; Will schedule for CT of the abdomen and pelvis with contrast CBC, CRP see met lipase today Start MiraLAX 17 g in 8 ounces of water daily Continue Prilosec 20 mg by mouth every morning Patient relates significant claustrophobia , and will give Ativan 0.5 -asked to take one half tablet prior to CT of the abdomen and pelvis. Patient will be established with Dr.  Corie Chiquito workup pending results of above.  Akeiba Axelson S Isaiah Torok PA-C 01/04/2016   Cc: [name], MD

## 2016-01-05 ENCOUNTER — Ambulatory Visit (INDEPENDENT_AMBULATORY_CARE_PROVIDER_SITE_OTHER)
Admission: RE | Admit: 2016-01-05 | Discharge: 2016-01-05 | Disposition: A | Discharge status: Home / Self Care | Payer: BC Managed Care – PPO | Source: Ambulatory Visit | Attending: Physician Assistant | Admitting: Physician Assistant

## 2016-01-05 DIAGNOSIS — K625 Hemorrhage of anus and rectum: Secondary | ICD-10-CM | POA: Diagnosis not present

## 2016-01-05 DIAGNOSIS — R1084 Generalized abdominal pain: Secondary | ICD-10-CM

## 2016-01-05 DIAGNOSIS — R194 Change in bowel habit: Secondary | ICD-10-CM

## 2016-01-05 DIAGNOSIS — R140 Abdominal distension (gaseous): Secondary | ICD-10-CM

## 2016-01-05 MED ORDER — IOPAMIDOL (ISOVUE-300) INJECTION 61%
100.0000 mL | Freq: Once | INTRAVENOUS | Status: AC | PRN
  Administered 2016-01-05: 100 mL via INTRAVENOUS

## 2016-01-05 NOTE — Progress Notes (Signed)
Office note reviewed. Complex patient. Agree with initial assessment and plans

## 2016-01-06 ENCOUNTER — Telehealth: Payer: Self-pay | Admitting: Internal Medicine

## 2016-01-06 NOTE — Telephone Encounter (Signed)
noted 

## 2016-01-17 ENCOUNTER — Telehealth: Payer: Self-pay | Admitting: *Deleted

## 2016-01-17 ENCOUNTER — Ambulatory Visit: Payer: BC Managed Care – PPO | Admitting: Adult Health

## 2016-01-17 NOTE — Telephone Encounter (Signed)
sw pt made her aware that Dr. Idelia Salm will be out of the office on 02/01/16. Gave appt for 02/17/16 @ 2:30pm. Pt is aware.Marland Kitchentd

## 2016-01-18 ENCOUNTER — Ambulatory Visit (INDEPENDENT_AMBULATORY_CARE_PROVIDER_SITE_OTHER): Payer: BC Managed Care – PPO | Admitting: Adult Health

## 2016-01-18 ENCOUNTER — Encounter: Payer: Self-pay | Admitting: Adult Health

## 2016-01-18 VITALS — BP 108/66 | HR 68 | Ht 63.0 in | Wt 278.0 lb

## 2016-01-18 DIAGNOSIS — G43019 Migraine without aura, intractable, without status migrainosus: Secondary | ICD-10-CM | POA: Diagnosis not present

## 2016-01-18 DIAGNOSIS — Z5181 Encounter for therapeutic drug level monitoring: Secondary | ICD-10-CM | POA: Diagnosis not present

## 2016-01-18 DIAGNOSIS — R569 Unspecified convulsions: Secondary | ICD-10-CM

## 2016-01-18 DIAGNOSIS — Z87820 Personal history of traumatic brain injury: Secondary | ICD-10-CM | POA: Diagnosis not present

## 2016-01-18 MED ORDER — GABAPENTIN 100 MG PO CAPS: 100.0000 mg | ORAL_CAPSULE | Freq: Every day | ORAL | Status: DC

## 2016-01-18 NOTE — Patient Instructions (Signed)
Start Gabapentin 100 mg at bedtime Continue Topamax and Lamictal If your symptoms worsen or you develop new symptoms please let us know.

## 2016-01-18 NOTE — Progress Notes (Signed)
PATIENT: [name] [name] DOB: 1966/06/05  REASON FOR VISIT: follow up- migraine, TBI, Seizures HISTORY FROM: patient  HISTORY OF PRESENT ILLNESS: [name] [name] is a 50 year old female with a history of traumatic brain injury migraines and seizures. She returns today for follow-up. The patient has tried a multitude of medications for her migraines all of which have been unsuccessful. She is currently on Topamax 200 mg daily and Lamictal 100 mg twice a day. She states that she continues to have daily headaches. Her headache location can vary. She does confirm photophobia, phonophobia, nausea and vomiting. She has tried Botox injections but reports she had an allergic reaction- swelling around the lips. Patient reports that she has not had any seizure events. She continues to have dizzy episodes. She returns today for an evaluation.  HISTORY 07/20/15: [name] [name] is a 50 y.o. female here as a referral from Dr. Kary Kos for Migraines and to follow up on Botox treatment.   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 Migraines since a child at the age of 32. Migraines are all over the head, throbbing and pounding. She endorses light sensitivity, she lays in her room in the dark, staying still helps, smells trigger the migraine, she gets nausea and vomiting. She is having the headaches on average 20 headache days a month, over 15 a month are migrainous, for years. They last 24 hours or longer. She has had a migraines for 4 days now and is a 7-8/10. The pain can become 10/10. She has tried multiple medications.  [name] [name] has undergone Botox injections - the first one- just yesterday with Dr. Jaynee Eagles.  These have helped significantly she is able to move her neck range of motion is not as limited the degree of headaches is much reduced and the frequency as well. She will need today some refills on her  medication, namely lamotrigine. She also appears less depressed than when I saw her last.  REVIEW OF SYSTEMS: Out of a complete 14 system review of symptoms, the patient complains only of the following symptoms, and all other reviewed systems are negative.  Swollen abdomen, abdominal pain, constipation, diarrhea, nausea, vomiting, incontinence of bowels, back pain, dizziness, headache, wheezing, shortness of breath, appetite change  ALLERGIES: Allergies  Allergen Reactions  . Amoxicillin Hives and Swelling  . Biaxin [Clarithromycin] Hives  . Erythromycin Base Hives  . Penicillins Hives and Swelling  . Zithromax [Azithromycin] Hives and Nausea Only    HOME MEDICATIONS: Outpatient Prescriptions Prior to Visit  Medication Sig Dispense Refill  . albuterol (PROVENTIL HFA;VENTOLIN HFA) 108 (90 BASE) MCG/ACT inhaler Inhale 2 puffs into the lungs every 6 (six) hours as needed for wheezing.    Marland Kitchen aspirin EC 325 MG tablet Take 1 tablet (325 mg total) by mouth daily.  0  . clonazePAM (KLONOPIN) 1 MG tablet Take 1 mg by mouth at bedtime as needed (for sleep and if she feels a seizure coming on).    . lamoTRIgine (LAMICTAL) 100 MG tablet Take 1 tablet (100 mg total) by mouth 2 (two) times daily. 60 tablet 6  . LORazepam (ATIVAN) 0.5 MG tablet Take 1/2 tablet before scan. 1 tablet 0  . metoprolol tartrate (LOPRESSOR) 25 MG tablet Take 1 tablet (25 mg total) by mouth 2 (two) times daily. 60 tablet 0  . omeprazole (PRILOSEC) 20 MG capsule Take 20 mg by mouth daily.    Marland Kitchen topiramate (TOPAMAX) 100 MG  tablet Take 100 mg by mouth 2 (two) times daily.  6  . Vitamin D, Ergocalciferol, (DRISDOL) 50000 units CAPS capsule TAKE 1 CAPSULE BY MOUTH ONCE A WEEK.  5   Facility-Administered Medications Prior to Visit  Medication Dose Route Frequency Provider Last Rate Last Dose  . triamcinolone acetonide (KENALOG-40) injection 80 mg  80 mg Intramuscular Once Lance Bosch, MD        PAST MEDICAL HISTORY: Past  Medical History  Diagnosis Date  . Seizures ([place])   . Asthma   . Obesity, Class III, BMI 40-49.9 (morbid obesity) ([place]) 12/21/2012  . Shortness of breath dyspnea   . Anginal pain ([place])   . Anxiety   . Atrial fibrillation ([place])   . Fibroid   . Ovarian cyst   . Back pain   . Migraine   . Surgical menopause   . Head injury   . TIA (transient ischemic attack)   . Back pain     bulging disk    PAST SURGICAL HISTORY: Past Surgical History  Procedure Laterality Date  . Cesarean section    . Esophagogastroduodenoscopy N/A 11/21/2012    Procedure: ESOPHAGOGASTRODUODENOSCOPY (EGD);  Surgeon: Irene Shipper, MD;  Location: [facility];  Service: Endoscopy;  Laterality: N/A;  . Colonoscopy N/A 11/21/2012    Procedure: COLONOSCOPY;  Surgeon: Irene Shipper, MD;  Location: [facility];  Service: Endoscopy;  Laterality: N/A;  . Dilation and curettage of uterus    . Hernia repair  12/17/12    laparoscopic incisional   . Incisional hernia repair N/A 12/17/2012    Procedure: LAPAROSCOPIC INCISIONAL HERNIA;  Surgeon: Harl Bowie, MD;  Location: [facility];  Service: General;  Laterality: N/A;  . Insertion of mesh N/A 12/17/2012    Procedure: INSERTION OF MESH;  Surgeon: Harl Bowie, MD;  Location: [facility];  Service: General;  Laterality: N/A;  . Cardiac catheterization N/A 01/21/2015    Procedure: Left Heart Cath;  Surgeon: Yolonda Kida, MD;  Location: [facility];  Service: Cardiovascular;  Laterality: N/A;  . Abdominal hysterectomy      wiht bso    FAMILY HISTORY: Family History  Problem Relation Age of Onset  . Hypertension Mother   . Depression Mother   . Heart disease Mother     deceased 12/30/13  . Heart attack Father   . Hypertension Father   . Emphysema Father   . COPD Father   . Heart disease Father   . Cancer Neg Hx   . Breast cancer Cousin   . Colon cancer Neg Hx   . Esophageal cancer Neg Hx   . Stomach cancer Neg Hx   . Pancreatic cancer Neg Hx   . Liver  disease Neg Hx     SOCIAL HISTORY: Social History   Social History  . Marital Status: Divorced    Spouse Name: N/A  . Number of Children: 2  . 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 Smoking status: Never Smoker   . Smokeless tobacco: Never Used  . Alcohol Use: 0.0 oz/week    0 Glasses of wine per week  . Drug Use: No  . Sexual Activity: Yes    Birth Control/ Protection: Surgical   Other Topics Concern  . Not on file   Social History Narrative   Patient is single with 2 children   Patient  is right handed   Patient has some college   Patient does not drink caffeine            PHYSICAL EXAM  Filed Vitals:   01/18/16 1401  BP: 108/66  Pulse: 68  Height: 5\' 3"  (1.6 m)  Weight: 278 lb (126.1 kg)   Body mass index is 49.26 kg/(m^2).  Generalized: Well developed, in no acute distress,obese  Neurological examination  Mentation: Alert oriented to time, place, history taking. Follows all commands speech and language fluent Cranial nerve II-XII: Pupils were equal round reactive to light. Extraocular movements were full, visual field were full on confrontational test. Facial sensation and strength were normal. Uvula tongue midline. Head turning and shoulder shrug  were normal and symmetric. Motor: The motor testing reveals 5 over 5 strength of all 4 extremities. Good symmetric motor tone is noted throughout.  Sensory: Sensory testing is intact to soft touch on all 4 extremities. No evidence of extinction is noted.  Coordination: Cerebellar testing reveals good finger-nose-finger and heel-to-shin bilaterally.  Gait and station: Gait is normal. Tandem gait is normal. Romberg is negative. No drift is seen.  Reflexes: Deep tendon reflexes are symmetric and normal bilaterally.   DIAGNOSTIC DATA (LABS, IMAGING, TESTING) - I reviewed patient records, labs, notes, testing and imaging  myself where available.  Lab Results  Component Value Date   WBC 6.9 01/04/2016   HGB 13.5 01/04/2016   HCT 39.9 01/04/2016   MCV 82.2 01/04/2016   PLT 222.0 01/04/2016      Component Value Date/Time   NA 141 01/04/2016 0945   NA 140 12/01/2014 1109   NA 144 06/09/2014 1514   K 4.1 01/04/2016 0945   K 3.8 12/01/2014 1109   CL 110 01/04/2016 0945   CL 111 12/01/2014 1109   CO2 25 01/04/2016 0945   CO2 23 12/01/2014 1109   GLUCOSE 100* 01/04/2016 0945   GLUCOSE 105* 10/12/2015 1010   GLUCOSE 99 12/01/2014 1109   BUN 13 01/04/2016 0945   BUN 17 12/01/2014 1109   BUN 11 06/09/2014 1514   CREATININE 0.90 01/04/2016 0945   CREATININE 0.83 12/01/2014 1109   CREATININE 0.82 06/22/2013 1152   CALCIUM 9.0 01/04/2016 0945   CALCIUM 8.7* 12/01/2014 1109   PROT 7.5 01/04/2016 0945   PROT 7.7 09/26/2014 1837   PROT 7.0 06/09/2014 1514   ALBUMIN 4.0 01/04/2016 0945   ALBUMIN 3.5 09/26/2014 1837   ALBUMIN 4.0 06/09/2014 1514   AST 11 01/04/2016 0945   AST 17 09/26/2014 1837   ALT 11 01/04/2016 0945   ALT 24 09/26/2014 1837   ALKPHOS 94 01/04/2016 0945   ALKPHOS 116 09/26/2014 1837   BILITOT 0.4 01/04/2016 0945   BILITOT 0.2 09/26/2014 1837   GFRNONAA >60 06/01/2015 2304   GFRNONAA >60 12/01/2014 1109   GFRNONAA >60 09/26/2014 1837   GFRAA >60 06/01/2015 2304   GFRAA >60 12/01/2014 1109   GFRAA >60 09/26/2014 1837   Lab Results  Component Value Date   CHOL 161 10/12/2015   HDL 47 10/12/2015   LDLCALC 100* 10/12/2015   TRIG 71 10/12/2015   CHOLHDL 3.4 10/12/2015   Lab Results  Component Value Date   HGBA1C 5.6 10/12/2015    Lab Results  Component Value Date   TSH 0.736 10/12/2015      ASSESSMENT AND PLAN 50 y.o. year old female  has a past medical history of Seizures ([place]); Asthma; Obesity, Class III, BMI 40-49.9 (morbid obesity) ([place]) (12/21/2012); Shortness  of breath dyspnea; Anginal pain ([place]); Anxiety; Atrial fibrillation ([place]); Fibroid; Ovarian cyst; Back  pain; Migraine; Surgical menopause; Head injury; TIA (transient ischemic attack); and Back pain. here with:  1. Seizures 2. History of traumatic brain injury 3. Migraines  The patient has not had any seizure events. She will continue on Topamax and Lamictal. I will check blood work today. The patient has been on a multitude of medications for migraine headaches with no success. I will try the patient on gabapentin 100 mg at bedtime. I have reviewed the potential side effects with the patient. Patient is not interested in being referred to another headache specialist. Patient advised that if her symptoms worsen or she develops any new symptoms she should let us know. She will follow-up in 6 months or sooner if needed     Ward Givens, MSN, NP-C 01/18/2016, 2:12 PM [facility] [address] [place], [place] [address] [phone]

## 2016-01-19 NOTE — Progress Notes (Signed)
I agree with the assessment and plan as directed by NP .The patient is known to me .   Neill Jurewicz, MD  

## 2016-01-20 ENCOUNTER — Telehealth: Payer: Self-pay | Admitting: Adult Health

## 2016-01-20 LAB — COMPREHENSIVE METABOLIC PANEL
ALBUMIN: 4 g/dL (ref 3.5–5.5)
ALK PHOS: 113 IU/L (ref 39–117)
ALT: 16 IU/L (ref 0–32)
AST: 11 IU/L (ref 0–40)
Albumin/Globulin Ratio: 1.3 (ref 1.2–2.2)
BILIRUBIN TOTAL: 0.3 mg/dL (ref 0.0–1.2)
BUN / CREAT RATIO: 13 (ref 9–23)
BUN: 10 mg/dL (ref 6–24)
CHLORIDE: 105 mmol/L (ref 96–106)
CO2: 20 mmol/L (ref 18–29)
Calcium: 8.7 mg/dL (ref 8.7–10.2)
Creatinine, Ser: 0.78 mg/dL (ref 0.57–1.00)
GFR calc Af Amer: 103 mL/min/{1.73_m2} (ref >59)
GFR calc non Af Amer: 90 mL/min/{1.73_m2} (ref >59)
GLUCOSE: 96 mg/dL (ref 65–99)
Globulin, Total: 3.1 g/dL (ref 1.5–4.5)
Potassium: 4.1 mmol/L (ref 3.5–5.2)
SODIUM: 142 mmol/L (ref 134–144)
Total Protein: 7.1 g/dL (ref 6.0–8.5)

## 2016-01-20 LAB — CBC WITH DIFFERENTIAL/PLATELET
BASOS ABS: 0 10*3/uL (ref 0.0–0.2)
Basos: 0 %
EOS (ABSOLUTE): 0.1 10*3/uL (ref 0.0–0.4)
Eos: 1 %
Hematocrit: 40.9 % (ref 34.0–46.6)
Hemoglobin: 13.6 g/dL (ref 11.1–15.9)
Immature Grans (Abs): 0 10*3/uL (ref 0.0–0.1)
Immature Granulocytes: 0 %
LYMPHS ABS: 3.2 10*3/uL — AB (ref 0.7–3.1)
Lymphs: 40 %
MCH: 28 pg (ref 26.6–33.0)
MCHC: 33.3 g/dL (ref 31.5–35.7)
MCV: 84 fL (ref 79–97)
MONOCYTES: 6 %
MONOS ABS: 0.5 10*3/uL (ref 0.1–0.9)
Neutrophils Absolute: 4.2 10*3/uL (ref 1.4–7.0)
Neutrophils: 53 %
PLATELETS: 231 10*3/uL (ref 150–379)
RBC: 4.86 x10E6/uL (ref 3.77–5.28)
RDW: 15.1 % (ref 12.3–15.4)
WBC: 8.1 10*3/uL (ref 3.4–10.8)

## 2016-01-20 LAB — LAMOTRIGINE LEVEL: LAMOTRIGINE LVL: 2.2 ug/mL (ref 2.0–20.0)

## 2016-01-20 NOTE — Telephone Encounter (Signed)
I called the patient. Her lab work was normal.

## 2016-02-01 ENCOUNTER — Ambulatory Visit: Payer: BC Managed Care – PPO | Admitting: Anesthesiology

## 2016-02-05 ENCOUNTER — Emergency Department (HOSPITAL_COMMUNITY): Payer: BC Managed Care – PPO

## 2016-02-05 ENCOUNTER — Encounter (HOSPITAL_COMMUNITY): Payer: Self-pay | Admitting: Family Medicine

## 2016-02-05 ENCOUNTER — Emergency Department (HOSPITAL_COMMUNITY)
Admission: EM | Admit: 2016-02-05 | Discharge: 2016-02-06 | Disposition: A | Discharge status: Home / Self Care | Payer: BC Managed Care – PPO | Attending: Emergency Medicine | Admitting: Emergency Medicine

## 2016-02-05 DIAGNOSIS — Z79899 Other long term (current) drug therapy: Secondary | ICD-10-CM | POA: Insufficient documentation

## 2016-02-05 DIAGNOSIS — J45909 Unspecified asthma, uncomplicated: Secondary | ICD-10-CM | POA: Insufficient documentation

## 2016-02-05 DIAGNOSIS — I4891 Unspecified atrial fibrillation: Secondary | ICD-10-CM | POA: Insufficient documentation

## 2016-02-05 DIAGNOSIS — R202 Paresthesia of skin: Secondary | ICD-10-CM | POA: Diagnosis not present

## 2016-02-05 DIAGNOSIS — Z8673 Personal history of transient ischemic attack (TIA), and cerebral infarction without residual deficits: Secondary | ICD-10-CM | POA: Insufficient documentation

## 2016-02-05 DIAGNOSIS — Z7982 Long term (current) use of aspirin: Secondary | ICD-10-CM | POA: Diagnosis not present

## 2016-02-05 DIAGNOSIS — R519 Headache, unspecified: Secondary | ICD-10-CM

## 2016-02-05 DIAGNOSIS — R51 Headache: Secondary | ICD-10-CM | POA: Insufficient documentation

## 2016-02-05 DIAGNOSIS — R079 Chest pain, unspecified: Secondary | ICD-10-CM | POA: Diagnosis not present

## 2016-02-05 LAB — DIFFERENTIAL
Basophils Absolute: 0 10*3/uL (ref 0.0–0.1)
Basophils Relative: 0 %
EOS ABS: 0.1 10*3/uL (ref 0.0–0.7)
EOS PCT: 1 %
LYMPHS ABS: 3.6 10*3/uL (ref 0.7–4.0)
LYMPHS PCT: 48 %
MONO ABS: 0.5 10*3/uL (ref 0.1–1.0)
Monocytes Relative: 6 %
NEUTROS PCT: 45 %
Neutro Abs: 3.4 10*3/uL (ref 1.7–7.7)

## 2016-02-05 LAB — BASIC METABOLIC PANEL
Anion gap: 5 (ref 5–15)
BUN: 12 mg/dL (ref 6–20)
CO2: 22 mmol/L (ref 22–32)
CREATININE: 0.77 mg/dL (ref 0.44–1.00)
Calcium: 9.3 mg/dL (ref 8.9–10.3)
Chloride: 111 mmol/L (ref 101–111)
Glucose, Bld: 103 mg/dL — ABNORMAL HIGH (ref 65–99)
POTASSIUM: 4.1 mmol/L (ref 3.5–5.1)
SODIUM: 138 mmol/L (ref 135–145)

## 2016-02-05 LAB — I-STAT TROPONIN, ED
Troponin i, poc: 0 ng/mL (ref 0.00–0.08)
Troponin i, poc: 0 ng/mL (ref 0.00–0.08)

## 2016-02-05 LAB — RAPID URINE DRUG SCREEN, HOSP PERFORMED
Amphetamines: NOT DETECTED
Barbiturates: NOT DETECTED
Benzodiazepines: NOT DETECTED
Cocaine: NOT DETECTED
Opiates: NOT DETECTED
Tetrahydrocannabinol: NOT DETECTED

## 2016-02-05 LAB — URINALYSIS, ROUTINE W REFLEX MICROSCOPIC
Bilirubin Urine: NEGATIVE
Glucose, UA: NEGATIVE mg/dL
Hgb urine dipstick: NEGATIVE
Ketones, ur: NEGATIVE mg/dL
Leukocytes, UA: NEGATIVE
Nitrite: NEGATIVE
Protein, ur: NEGATIVE mg/dL
Specific Gravity, Urine: 1.008 (ref 1.005–1.030)
pH: 8 (ref 5.0–8.0)

## 2016-02-05 LAB — CBC
HCT: 41.3 % (ref 36.0–46.0)
Hemoglobin: 13.4 g/dL (ref 12.0–15.0)
MCH: 26.9 pg (ref 26.0–34.0)
MCHC: 32.4 g/dL (ref 30.0–36.0)
MCV: 82.8 fL (ref 78.0–100.0)
PLATELETS: 193 10*3/uL (ref 150–400)
RBC: 4.99 MIL/uL (ref 3.87–5.11)
RDW: 14.4 % (ref 11.5–15.5)
WBC: 7.2 10*3/uL (ref 4.0–10.5)

## 2016-02-05 LAB — PROTIME-INR
INR: 1.04 (ref 0.00–1.49)
Prothrombin Time: 13.8 s (ref 11.6–15.2)

## 2016-02-05 LAB — APTT: aPTT: 35 s (ref 24–37)

## 2016-02-05 MED ORDER — DIPHENHYDRAMINE HCL 50 MG/ML IJ SOLN
25.0000 mg | Freq: Once | INTRAMUSCULAR | Status: AC
  Administered 2016-02-05: 25 mg via INTRAVENOUS
  Filled 2016-02-05: qty 1

## 2016-02-05 MED ORDER — KETOROLAC TROMETHAMINE 15 MG/ML IJ SOLN
15.0000 mg | Freq: Once | INTRAMUSCULAR | Status: AC
  Administered 2016-02-05: 15 mg via INTRAVENOUS
  Filled 2016-02-05: qty 1

## 2016-02-05 MED ORDER — LORAZEPAM 2 MG/ML IJ SOLN
1.0000 mg | Freq: Once | INTRAMUSCULAR | Status: AC
  Administered 2016-02-05: 1 mg via INTRAVENOUS
  Filled 2016-02-05: qty 1

## 2016-02-05 MED ORDER — ONDANSETRON HCL 4 MG/2ML IJ SOLN
4.0000 mg | Freq: Once | INTRAMUSCULAR | Status: AC
  Administered 2016-02-05: 4 mg via INTRAVENOUS
  Filled 2016-02-05: qty 2

## 2016-02-05 MED ORDER — PROCHLORPERAZINE EDISYLATE 5 MG/ML IJ SOLN
10.0000 mg | Freq: Once | INTRAMUSCULAR | Status: AC
  Administered 2016-02-05: 10 mg via INTRAVENOUS
  Filled 2016-02-05: qty 2

## 2016-02-05 MED ORDER — PROCHLORPERAZINE MALEATE 10 MG PO TABS
10.0000 mg | ORAL_TABLET | Freq: Once | ORAL | Status: DC
  Filled 2016-02-05: qty 1

## 2016-02-05 MED ORDER — MORPHINE SULFATE (PF) 4 MG/ML IV SOLN
4.0000 mg | Freq: Once | INTRAVENOUS | Status: AC
  Administered 2016-02-05: 4 mg via INTRAVENOUS
  Filled 2016-02-05: qty 1

## 2016-02-05 NOTE — Discharge Instructions (Signed)
Please follow-up with PCP tomorrow and inform them of today's visit and all relevant data. If you experience a return of symptoms, please return to the emergency room for further evaluation.

## 2016-02-05 NOTE — ED Notes (Signed)
Pt back in room. CT personnel at bedside attempting earring removal.

## 2016-02-05 NOTE — ED Notes (Signed)
Helene Kelp from [facility] called stated that she could not remove internal earring from pt and may be sending her back to floor.

## 2016-02-05 NOTE — ED Notes (Signed)
Patient transported to MRI 

## 2016-02-05 NOTE — ED Notes (Signed)
Pt here for chest pain that started this am. sts it woke her up. sts she feels like something is sitting on her chest. She took ASA and metoprolol. sts weak and dizzy.

## 2016-02-05 NOTE — ED Provider Notes (Signed)
50 year old female signed out to me at shift change by Donnald Garre PA-C. Please see previous provider's note for full H&P.  Brief history patient is a 50 year old female with history of seizures, TBI, TIA, A. fib presenting today with chest pain. She reports this pain worse or up in the early morning hours, described as severe right sided pressure. Patient notes that after awakening she started to develop migraines, history of the same, located throughout her entire head, with the addition of right-sided tingling in her arms and legs. Patient notes these symptoms are atypical of her migraines, and more typical of her previous TIA. She reports back in August 2016 she suffered a TIA and was seen here in the ED. Chart review notes the patient presented with right-sided weakness, headache, neurological symptoms. She had CT scan and MRI with no findings of TIA, likely complex migraine. Patient is currently taking Plavix.   She continues to endorse right-sided chest pain, and headache despite morphine therapy here in the ED. Patient will be given a headache cocktail. I consult to neurology and spoke with Dr. Leonel Ramsay. Due to patient's previous symptoms of the same, with negative workup instructed to treat the headache and look for symptomatic improvement in the neuro symptoms. He reports that if symptoms are not resolved with medication that MRI would be needed to rule out TIA. If MRI is negative patient is safe to be discharged home and follow up with her neurologist.  I spoke to patient about plan going forward, we together decided to proceed with the MRI and pain medication here in the ED. Additionally repeat troponin will be performed.  At this time I have low suspicion for ACS, patient has a heart score of 3. This is right-sided chest pain not typical of ACS. Patient is perk negative, she has very reassuring vital signs with a heart rate of 52, blood pressure 128/72, oxygenation of 99 and respiratory  rate of 16. Patient's perk negative, vital signs combined make pulmonary embolism very unlikely. Patient has no signs or symptoms that are consistent with dissection at this time, uncertain etiology of her right-sided chest pain.  MRI returned showing no acute abnormality, patient has been sleeping throughout her stay in the ED, she is in no acute distress. She reports both the headache symptoms, paresthesias, and chest pain have improved.  Again I have very low suspicion for any acute intrathoracic abdominal pathology in this patient. Recheck her vital signs again show very reassuring signs. With repeat troponin negative, no concerning findings on EKG, chest x-ray, MRI. Patient will be discharged home with follow-up with her primary care provider. She is counseled on strict return cautioned, she verbalized her understanding and agreement to today's plan had no further questions or concerns at time of discharge    Filed Vitals:   02/05/16 1900 02/05/16 1919  BP:  130/80  Pulse: 69 52  Temp:    Resp: 12 13    Labs: I-STAT troponin/ troponin, urinalysis, urine rapid drug screen, PT/INR, APTT, BMP  Imaging: MRI brain without on the CT head without, DG chest 2 view, ED EKG  Consults: Neurology  Therapeutics: Ativan, Compazine, Toradol, Benadryl, morphine, Zofran  Discharge Meds: None     Okey Regal, PA-C 02/06/16 0005  Gareth Morgan, MD 02/06/16 1251

## 2016-02-05 NOTE — ED Provider Notes (Signed)
CSN: [id number]     Arrival date & time 02/05/16  1359 History   First MD Initiated Contact with Patient 02/05/16 1429     Chief Complaint  Patient presents with  . Chest Pain     (Consider location/radiation/quality/duration/timing/severity/associated sxs/prior Treatment) HPI  [name] [name] is a 50 year old female with a past medical history of seizures, TBI, TIA, atrial fibrillation who presents to the ED today complaining of chest pain. Patient states that she was awakened from sleep around 4 AM this morning with severe substernal and right-sided chest pain. Pain is described as sharp in nature and as pressure on the right side of her chest. Patient has associated diffuse headache that feels like "a jackhammer is in my head". She also reports associated tingling down her right arm and right leg and a feeling of weakness in his extremities. She has been ambulatory without difficulty. Patient states that she laid in bed for several hours and the pain continued to get worse. This morning when she finally got up out of bed she took her metoprolol and aspirin that did not relieve her symptoms. Patient states she also feels nauseous and dizzy upon standing but did not pass out. Patient is very concerned because she had a TIA last year and this feels very similar to that. However, this time she states the chest pain is much more severe. Patient denies syncope, slurred speech, change in vision, back pain, abdominal pain. Pt started Plavix 1 week ago.   Past Medical History  Diagnosis Date  . Seizures ([place])   . Asthma   . Obesity, Class III, BMI 40-49.9 (morbid obesity) ([place]) 12/21/2012  . Shortness of breath dyspnea   . Anginal pain ([place])   . Anxiety   . Atrial fibrillation ([place])   . Fibroid   . Ovarian cyst   . Back pain   . Migraine   . Surgical menopause   . Head injury   . TIA (transient ischemic attack)   . Back pain     bulging disk   Past Surgical History  Procedure Laterality  Date  . Cesarean section    . Esophagogastroduodenoscopy N/A 11/21/2012    Procedure: ESOPHAGOGASTRODUODENOSCOPY (EGD);  Surgeon: Irene Shipper, MD;  Location: [facility];  Service: Endoscopy;  Laterality: N/A;  . Colonoscopy N/A 11/21/2012    Procedure: COLONOSCOPY;  Surgeon: Irene Shipper, MD;  Location: [facility];  Service: Endoscopy;  Laterality: N/A;  . Dilation and curettage of uterus    . Hernia repair  12/17/12    laparoscopic incisional   . Incisional hernia repair N/A 12/17/2012    Procedure: LAPAROSCOPIC INCISIONAL HERNIA;  Surgeon: Harl Bowie, MD;  Location: [facility];  Service: General;  Laterality: N/A;  . Insertion of mesh N/A 12/17/2012    Procedure: INSERTION OF MESH;  Surgeon: Harl Bowie, MD;  Location: [facility];  Service: General;  Laterality: N/A;  . Cardiac catheterization N/A 01/21/2015    Procedure: Left Heart Cath;  Surgeon: Yolonda Kida, MD;  Location: [facility];  Service: Cardiovascular;  Laterality: N/A;  . Abdominal hysterectomy      wiht bso   Family History  Problem Relation Age of Onset  . Hypertension Mother   . Depression Mother   . Heart disease Mother     deceased 2013-12-30  . Heart attack Father   . Hypertension Father   . Emphysema Father   . COPD Father   . Heart disease Father   .  Cancer Neg Hx   . Breast cancer Cousin   . Colon cancer Neg Hx   . Esophageal cancer Neg Hx   . Stomach cancer Neg Hx   . Pancreatic cancer Neg Hx   . Liver disease Neg Hx    Social History  Substance Use Topics  . Smoking status: Never Smoker   . Smokeless tobacco: Never Used  . Alcohol Use: 0.0 oz/week    0 Glasses of wine per week   OB History    No data available     Review of Systems  All other systems reviewed and are negative.     Allergies  Amoxicillin; Biaxin; Erythromycin base; Penicillins; and Zithromax  Home Medications   Prior to Admission medications   Medication Sig Start Date End Date Taking? Authorizing  Provider  albuterol (PROVENTIL HFA;VENTOLIN HFA) 108 (90 BASE) MCG/ACT inhaler Inhale 2 puffs into the lungs every 6 (six) hours as needed for wheezing.    Historical Provider, MD  aspirin EC 325 MG tablet Take 1 tablet (325 mg total) by mouth daily. 04/25/15   Domenic Polite, MD  clonazePAM (KLONOPIN) 1 MG tablet Take 1 mg by mouth at bedtime as needed (for sleep and if she feels a seizure coming on).    Historical Provider, MD  gabapentin (NEURONTIN) 100 MG capsule Take 1 capsule (100 mg total) by mouth at bedtime. 01/18/16   Ward Givens, NP  lamoTRIgine (LAMICTAL) 100 MG tablet Take 1 tablet (100 mg total) by mouth 2 (two) times daily. 07/20/15   Asencion Partridge Dohmeier, MD  LORazepam (ATIVAN) 0.5 MG tablet Take 1/2 tablet before scan. 01/04/16   Amy S Esterwood, PA-C  metoprolol tartrate (LOPRESSOR) 25 MG tablet Take 1 tablet (25 mg total) by mouth 2 (two) times daily. 04/25/15   Domenic Polite, MD  omeprazole (PRILOSEC) 20 MG capsule Take 20 mg by mouth daily.    Historical Provider, MD  topiramate (TOPAMAX) 100 MG tablet Take 100 mg by mouth 2 (two) times daily. 09/13/15   Historical Provider, MD  Vitamin D, Ergocalciferol, (DRISDOL) 50000 units CAPS capsule TAKE 1 CAPSULE BY MOUTH ONCE A WEEK. 08/31/15   Historical Provider, MD   BP 155/85 mmHg  Pulse 84  Temp(Src) 98.2 F (36.8 C)  Resp 18  SpO2 97% Physical Exam  Constitutional: She is oriented to person, place, and time. She appears well-developed and well-nourished. No distress.  HENT:  Head: Normocephalic and atraumatic.  Mouth/Throat: No oropharyngeal exudate.  Eyes: Conjunctivae and EOM are normal. Pupils are equal, round, and reactive to light. Right eye exhibits no discharge. Left eye exhibits no discharge. No scleral icterus.  Neck: Neck supple.  Cardiovascular: Normal rate, regular rhythm, normal heart sounds and intact distal pulses.  Exam reveals no gallop and no friction rub.   No murmur heard. Pulmonary/Chest: Effort normal and  breath sounds normal. No respiratory distress. She has no wheezes. She has no rales. She exhibits tenderness ( right sided anterior chest wall TTP).  Abdominal: Soft. She exhibits no distension. There is no tenderness. There is no guarding.  Musculoskeletal: Normal range of motion. She exhibits no edema.  Lymphadenopathy:    She has no cervical adenopathy.  Neurological: She is alert and oriented to person, place, and time. No cranial nerve deficit. She exhibits normal muscle tone. Coordination normal.  Strength 5/5 throughout. No sensory deficits.  No facial droop. No pronator drift. No dysmetria. No gait abnormality.  Skin: Skin is warm and dry. No rash noted. She  is not diaphoretic. No erythema. No pallor.  Psychiatric: She has a normal mood and affect. Her behavior is normal.  Nursing note and vitals reviewed.   ED Course  Procedures (including critical care time) Labs Review Labs Reviewed  BASIC METABOLIC PANEL - Abnormal; Notable for the following:    Glucose, Bld 103 (*)    All other components within normal limits  CBC  PROTIME-INR  APTT  DIFFERENTIAL  URINE RAPID DRUG SCREEN, HOSP PERFORMED  URINALYSIS, ROUTINE W REFLEX MICROSCOPIC (NOT AT [facility])  Randolm Idol, ED    Imaging Review Dg Chest 2 View  02/05/2016  CLINICAL DATA:  Pt is having chest pressure and pain since this morning. Pain woke her up. Hx asthma, afib EXAM: CHEST  2 VIEW COMPARISON:  None. FINDINGS: The heart size and mediastinal contours are within normal limits. Both lungs are clear. The visualized skeletal structures are unremarkable. IMPRESSION: No active cardiopulmonary disease. Electronically Signed   By: Skipper Cliche M.D.   On: 02/05/2016 14:35   I have personally reviewed and evaluated these images and lab results as part of my medical decision-making.   EKG Interpretation   Date/Time:  Sunday February 05 2016 14:04:41 EDT Ventricular Rate:  83 PR Interval:  168 QRS Duration: 80 QT Interval:   388 QTC Calculation: 455 R Axis:   88 Text Interpretation:  Normal sinus rhythm Septal infarct , age  undetermined Abnormal ECG Nonspecific T wave abnormality Confirmed by DELO   MD, DOUGLAS ([id number]) on 02/05/2016 3:07:11 PM      MDM   Final diagnoses:  Chest pain, unspecified chest pain type    50 y.o F with a pmhx of TBI, TIA, obesity presents to the ED c/o chest pain and headache. Pt states that she was awakened from sleep with severe R sided chest pain and a diffuse HA. Pt also reports weakness in right upper and right lower extremity with associated tingling sensation. Symptom onset was approximately 10 hours ago and has remained constant despite home aspirin and labetolol. Pt appears well in ED, in NAD. All VSS. No neurological deficits on exam. Strength 5/5 throughout. No facial droop or pronator drift. CP is reproducible on exam. Pt is concerned she is having another TIA. CT head unremarkable. No focal weakness or neuro deficit. Doubt this is a TIA, SAH. Initial troponin 0.0. EKG reveals non-specific T-wave abnormality but no overt signs of ischemia. PERC negative. Doubt PE. Doubt aortic dissection. Pt has equal pulses and BP bilaterally and no neuro deficits. Pain is not ripping, tearing or radiating to the back. Pt HEART score is 3, low risk. However, will obtain repeat troponin in ED. All other lab work wnl. Pt will be given morphine and zofran for symptomatic relief.  Pt signed out to [employer] PA-C at shift change pending repeat troponin. This will be due at 5:30 PM. If normal, plan to d/c with cardiology and PCP follow up. Recommend symptomatic tx for pain.      Dondra Spry [place], PA-C 02/05/16 1603  Veryl Speak, MD 02/09/16 [phone]

## 2016-02-17 ENCOUNTER — Encounter: Payer: Self-pay | Admitting: Anesthesiology

## 2016-02-17 ENCOUNTER — Ambulatory Visit: Payer: BC Managed Care – PPO | Attending: Anesthesiology | Admitting: Anesthesiology

## 2016-02-17 ENCOUNTER — Ambulatory Visit: Payer: BC Managed Care – PPO | Admitting: Anesthesiology

## 2016-02-17 VITALS — BP 147/89 | HR 69 | Temp 98.5°F | Resp 16 | Ht 63.0 in | Wt 273.0 lb

## 2016-02-17 DIAGNOSIS — M5417 Radiculopathy, lumbosacral region: Secondary | ICD-10-CM

## 2016-02-17 DIAGNOSIS — M545 Low back pain, unspecified: Secondary | ICD-10-CM

## 2016-02-17 DIAGNOSIS — G8929 Other chronic pain: Secondary | ICD-10-CM | POA: Insufficient documentation

## 2016-02-17 DIAGNOSIS — M5116 Intervertebral disc disorders with radiculopathy, lumbar region: Secondary | ICD-10-CM | POA: Insufficient documentation

## 2016-02-17 DIAGNOSIS — M549 Dorsalgia, unspecified: Secondary | ICD-10-CM | POA: Diagnosis present

## 2016-02-17 DIAGNOSIS — M5137 Other intervertebral disc degeneration, lumbosacral region: Secondary | ICD-10-CM

## 2016-02-17 NOTE — Progress Notes (Signed)
Safety precautions to be maintained throughout the outpatient stay will include: orient to surroundings, keep bed in low position, maintain call bell within reach at all times, provide assistance with transfer out of bed and ambulation.  

## 2016-02-17 NOTE — Patient Instructions (Signed)
Epidural Steroid Injection Patient Information  Description: The epidural space surrounds the nerves as they exit the spinal cord.  In some patients, the nerves can be compressed and inflamed by a bulging disc or a tight spinal canal (spinal stenosis).  By injecting steroids into the epidural space, we can bring irritated nerves into direct contact with a potentially helpful medication.  These steroids act directly on the irritated nerves and can reduce swelling and inflammation which often leads to decreased pain.  Epidural steroids may be injected anywhere along the spine and from the neck to the low back depending upon the location of your pain.   After numbing the skin with local anesthetic (like Novocaine), a small needle is passed into the epidural space slowly.  You may experience a sensation of pressure while this is being done.  The entire block usually last less than 10 minutes.  Conditions which may be treated by epidural steroids:   Low back and leg pain  Neck and arm pain  Spinal stenosis  Post-laminectomy syndrome  Herpes zoster (shingles) pain  Pain from compression fractures  Preparation for the injection:  1. Do not eat any solid food or dairy products within 8 hours of your appointment.  2. You may drink clear liquids up to 3 hours before appointment.  Clear liquids include water, black coffee, juice or soda.  No milk or cream please. 3. You may take your regular medication, including pain medications, with a sip of water before your appointment  Diabetics should hold regular insulin (if taken separately) and take 1/2 normal NPH dos the morning of the procedure.  Carry some sugar containing items with you to your appointment. 4. A driver must accompany you and be prepared to drive you home after your procedure.  5. Bring all your current medications with your. 6. An IV may be inserted and sedation may be given at the discretion of the physician.   7. A blood pressure  cuff, EKG and other monitors will often be applied during the procedure.  Some patients may need to have extra oxygen administered for a short period. 8. You will be asked to provide medical information, including your allergies, prior to the procedure.  We must know immediately if you are taking blood thinners (like Coumadin/Warfarin)  Or if you are allergic to IV iodine contrast (dye). We must know if you could possible be pregnant.  Possible side-effects:  Bleeding from needle site  Infection (rare, may require surgery)  Nerve injury (rare)  Numbness & tingling (temporary)  Difficulty urinating (rare, temporary)  Spinal headache ( a headache worse with upright posture)  Light -headedness (temporary)  Pain at injection site (several days)  Decreased blood pressure (temporary)  Weakness in arm/leg (temporary)  Pressure sensation in back/neck (temporary)  Call if you experience:  Fever/chills associated with headache or increased back/neck pain.  Headache worsened by an upright position.  New onset weakness or numbness of an extremity below the injection site  Hives or difficulty breathing (go to the emergency room)  Inflammation or drainage at the infection site  Severe back/neck pain  Any new symptoms which are concerning to you  Please note:  Although the local anesthetic injected can often make your back or neck feel good for several hours after the injection, the pain will likely return.  It takes 3-7 days for steroids to work in the epidural space.  You may not notice any pain relief for at least that one week.    If effective, we will often do a series of three injections spaced 3-6 weeks apart to maximally decrease your pain.  After the initial series, we generally will wait several months before considering a repeat injection of the same type.  If you have any questions, please call [phone] [facility] 

## 2016-02-19 NOTE — Progress Notes (Signed)
   Subjective:    Patient ID: [name] [name], female    DOB: 1966/05/16, 50 y.o.   MRN: [id number]  HPI  His patient returned to the clinic after an extended absence and she indicates that she has a recurrence of her back pain I have treated her about a year ago and again in about 6 months ago and she respondedreasonably well to the past interventions Today she says her pain is recurrent and s another caudal epidural steroid injection She at approximately 3 hours ago and per hospital policy she cannot receive intravenous sedation for that procedure She opts not to have the caudal epidural steroid injection without intravenous sedation Therefore I would reschedule her to have the caudal epidural steroid injection next week I have also asked her to bring a driver to accompany her  Review of Systems  Constitutional: Negative.   HENT: Negative.   Eyes: Negative.   Respiratory: Negative.   Cardiovascular: Negative.   Endocrine: Negative.   Genitourinary: Negative.   Allergic/Immunologic: Negative.   Neurological: Negative.   Hematological: Negative.   Psychiatric/Behavioral: Negative.        Objective:   Physical Exam  Cardiovascular:  Her vital signs are stable No new neurological or musculoskeletal findings  Nursing note and vitals reviewed.         Assessment & Plan:   Assessment 1 chronic low back pain 2 lumbar degenerative disc disease 3 Lumbar  Radiculopathy   Plan of management We'll plan a caudal epidural steroid njection for her next week   Established patient    Level  2    Lance Bosch M.D.

## 2016-02-24 ENCOUNTER — Other Ambulatory Visit: Payer: Self-pay | Admitting: Neurology

## 2016-02-24 ENCOUNTER — Encounter: Payer: Self-pay | Admitting: Anesthesiology

## 2016-02-24 ENCOUNTER — Ambulatory Visit: Payer: BC Managed Care – PPO | Attending: Anesthesiology | Admitting: Anesthesiology

## 2016-02-24 VITALS — BP 153/91 | HR 65 | Temp 97.5°F | Resp 12 | Ht 63.0 in | Wt 268.0 lb

## 2016-02-24 DIAGNOSIS — M5116 Intervertebral disc disorders with radiculopathy, lumbar region: Secondary | ICD-10-CM | POA: Insufficient documentation

## 2016-02-24 DIAGNOSIS — M5137 Other intervertebral disc degeneration, lumbosacral region: Secondary | ICD-10-CM

## 2016-02-24 DIAGNOSIS — M5417 Radiculopathy, lumbosacral region: Secondary | ICD-10-CM | POA: Insufficient documentation

## 2016-02-24 DIAGNOSIS — M545 Low back pain, unspecified: Secondary | ICD-10-CM

## 2016-02-24 DIAGNOSIS — G8929 Other chronic pain: Secondary | ICD-10-CM | POA: Insufficient documentation

## 2016-02-24 MED ORDER — FENTANYL CITRATE (PF) 100 MCG/2ML IJ SOLN
50.0000 ug | INTRAMUSCULAR | Status: DC
  Filled 2016-02-24: qty 2

## 2016-02-24 MED ORDER — IOPAMIDOL (ISOVUE-M 200) INJECTION 41%
INTRAMUSCULAR | Status: AC
  Administered 2016-02-24: 09:00:00
  Filled 2016-02-24: qty 10

## 2016-02-24 MED ORDER — IOPAMIDOL (ISOVUE-M 200) INJECTION 41%: 20.0000 mL | INTRAMUSCULAR | Status: DC | PRN

## 2016-02-24 MED ORDER — BUPIVACAINE HCL (PF) 0.25 % IJ SOLN
20.0000 mL | Freq: Once | INTRAMUSCULAR | Status: AC
  Administered 2016-02-24: 20 mL
  Filled 2016-02-24: qty 30

## 2016-02-24 MED ORDER — MIDAZOLAM HCL 5 MG/5ML IJ SOLN
1.0000 mg | INTRAMUSCULAR | Status: DC
  Filled 2016-02-24: qty 5

## 2016-02-24 MED ORDER — TRIAMCINOLONE ACETONIDE 40 MG/ML IJ SUSP
80.0000 mg | Freq: Once | INTRAMUSCULAR | Status: AC
  Administered 2016-02-24: 80 mg via INTRAMUSCULAR
  Filled 2016-02-24: qty 2

## 2016-02-24 NOTE — Progress Notes (Signed)
Safety precautions to be maintained throughout the outpatient stay will include: orient to surroundings, keep bed in low position, maintain call bell within reach at all times, provide assistance with transfer out of bed and ambulation.  

## 2016-02-24 NOTE — Patient Instructions (Signed)
Epidural Steroid Injection Patient Information  Description: The epidural space surrounds the nerves as they exit the spinal cord.  In some patients, the nerves can be compressed and inflamed by a bulging disc or a tight spinal canal (spinal stenosis).  By injecting steroids into the epidural space, we can bring irritated nerves into direct contact with a potentially helpful medication.  These steroids act directly on the irritated nerves and can reduce swelling and inflammation which often leads to decreased pain.  Epidural steroids may be injected anywhere along the spine and from the neck to the low back depending upon the location of your pain.   After numbing the skin with local anesthetic (like Novocaine), a small needle is passed into the epidural space slowly.  You may experience a sensation of pressure while this is being done.  The entire block usually last less than 10 minutes.  Conditions which may be treated by epidural steroids:   Low back and leg pain  Neck and arm pain  Spinal stenosis  Post-laminectomy syndrome  Herpes zoster (shingles) pain  Pain from compression fractures  Preparation for the injection:  1. Do not eat any solid food or dairy products within 8 hours of your appointment.  2. You may drink clear liquids up to 3 hours before appointment.  Clear liquids include water, black coffee, juice or soda.  No milk or cream please. 3. You may take your regular medication, including pain medications, with a sip of water before your appointment  Diabetics should hold regular insulin (if taken separately) and take 1/2 normal NPH dos the morning of the procedure.  Carry some sugar containing items with you to your appointment. 4. A driver must accompany you and be prepared to drive you home after your procedure.  5. Bring all your current medications with your. 6. An IV may be inserted and sedation may be given at the discretion of the physician.   7. A blood pressure  cuff, EKG and other monitors will often be applied during the procedure.  Some patients may need to have extra oxygen administered for a short period. 8. You will be asked to provide medical information, including your allergies, prior to the procedure.  We must know immediately if you are taking blood thinners (like Coumadin/Warfarin)  Or if you are allergic to IV iodine contrast (dye). We must know if you could possible be pregnant.  Possible side-effects:  Bleeding from needle site  Infection (rare, may require surgery)  Nerve injury (rare)  Numbness & tingling (temporary)  Difficulty urinating (rare, temporary)  Spinal headache ( a headache worse with upright posture)  Light -headedness (temporary)  Pain at injection site (several days)  Decreased blood pressure (temporary)  Weakness in arm/leg (temporary)  Pressure sensation in back/neck (temporary)  Call if you experience:  Fever/chills associated with headache or increased back/neck pain.  Headache worsened by an upright position.  New onset weakness or numbness of an extremity below the injection site  Hives or difficulty breathing (go to the emergency room)  Inflammation or drainage at the infection site  Severe back/neck pain  Any new symptoms which are concerning to you  Please note:  Although the local anesthetic injected can often make your back or neck feel good for several hours after the injection, the pain will likely return.  It takes 3-7 days for steroids to work in the epidural space.  You may not notice any pain relief for at least that one week.    If effective, we will often do a series of three injections spaced 3-6 weeks apart to maximally decrease your pain.  After the initial series, we generally will wait several months before considering a repeat injection of the same type.  If you have any questions, please call [phone] [facility]  Discharge Instructions  General Discharge Instructions :  If you need to reach your doctor call: Monday-Friday 8:00 am - 4:00 pm at [phone] or toll free [phone].  After clinic hours [phone] to have operator reach doctor.  Bring all of your medication bottles to all your appointments in the pain clinic.  To cancel or reschedule your appointment with Pain Management please remember to call 24 hours in advance to avoid a fee.  Refer to the educational materials which you have been given on: General Risks, I had my Procedure. Discharge Instructions, Post Sedation.  Post Procedure Instructions:  The drugs you were given will stay in your system until tomorrow, so for the next 24 hours you should not drive, make any legal decisions or drink any alcoholic beverages.  You may eat anything you prefer, but it is better to start with liquids then soups and crackers, and gradually work up to solid foods.  Please notify your doctor immediately if you have any unusual bleeding, trouble breathing or pain that is not related to your normal pain.  Depending on the type of procedure that was done, some parts of your body may feel week and/or numb.  This usually clears up by tonight or the next day.  Walk with the use of an assistive device or accompanied by an adult for the 24 hours.  You may use ice on the affected area for the first 24 hours.  Put ice in a Ziploc bag and cover with a towel and place against area 15 minutes on 15 minutes off.  You may switch to heat after 24 hours. 

## 2016-02-24 NOTE — Procedures (Signed)
Date of procedure:  02/24/2016  Preoperative Diagnosis:  1 chronic low back pain 2 lumbar degenerative disc disease 3 lumbar radiculopathy  Postoperative Diagnosis: Same.  Procedure: 1. Caudal epidural steroid injection, 2. Epidural with interpretation. 3. Fluoroscopic guidance.  Surgeon: Lance Bosch, MD  Anesthesia: MAC anesthesia by the nurse and staff under my direction  Informed consent was obtained and the patient appeared to accept and understand the benefits and risks of this procedure.   Pre procedure comments:  None  Description of the Procedure:  The patient was taken to the operating room and placed in the prone position.  Intravenous sedation and MAC anesthesia was administered by the nurse and staff under my direction. After appropriate sedation, the sacrococcygeal area was prepped with Betadine.  After adequate draping, the area between the sacral cornu was palpated and infiltrated with 3 cc of 1% Lidocaine.   An AP fluoroscopic view of the sacrum was visualized and a 17 gauge Tuohy needle was inserted in the midline at the angle of 45 degrees through the sacrococcygeal membrane.  After making contact with the bone, the needle was withdrawn and readvanced in horizontal position, into the caudal epidural space.  Epidurogram Study: One cc of Omnipaque 300 was injected through the needle and epidurogram was visualized in both the later and AP views. After injecting the contrast through the Tuohy needle, the dye was observed to spread cephalad and was more predominant on the left side The spread to the right side was somewhat restricted suggestive of neuroforaminal stenosis  Comments:   This procedure was done using fluoroscopic guidance Fluoroscopic time was 0.2 minutes Number of fluoroscopic frames were 2 MG Y was 8.2 No catheter was used   Caudal Epidural Steroid Injection:  Then 10 cc of 0.25% Bupivacaine and 80 mg of Kenalog were injected into the  Caudal epidural space.  The needle was removed and adequate hemostasis was established.    The patient tolerated the procedure quite well and vital signs were stable.  There were no adverse effects.  Additional comments:    The patient was taken to the recovery room in satisfactory condition where the patient was observed and subsequently discharged home.  Will follow up in the clinic in the next week.  Lance Bosch M.D.

## 2016-02-29 ENCOUNTER — Telehealth: Payer: Self-pay

## 2016-02-29 NOTE — Telephone Encounter (Signed)
Pt states that this procedure has not helped her at all- States that she has had four treatments and none have helped her- instructed to talk with Dr Maureen Chatters about new procedure

## 2016-03-23 ENCOUNTER — Ambulatory Visit: Payer: BC Managed Care – PPO | Admitting: Anesthesiology

## 2016-04-18 ENCOUNTER — Telehealth: Payer: Self-pay

## 2016-04-18 NOTE — Telephone Encounter (Signed)
Form for [name] department of labor completed and sent back to MR.

## 2016-05-04 DIAGNOSIS — Z0289 Encounter for other administrative examinations: Secondary | ICD-10-CM

## 2016-05-14 ENCOUNTER — Emergency Department (HOSPITAL_COMMUNITY)
Admission: EM | Admit: 2016-05-14 | Discharge: 2016-05-14 | Disposition: A | Discharge status: Home / Self Care | Payer: BC Managed Care – PPO | Attending: Dermatology | Admitting: Dermatology

## 2016-05-14 ENCOUNTER — Encounter (HOSPITAL_COMMUNITY): Payer: Self-pay | Admitting: *Deleted

## 2016-05-14 DIAGNOSIS — M549 Dorsalgia, unspecified: Secondary | ICD-10-CM | POA: Insufficient documentation

## 2016-05-14 DIAGNOSIS — Z5321 Procedure and treatment not carried out due to patient leaving prior to being seen by health care provider: Secondary | ICD-10-CM | POA: Insufficient documentation

## 2016-05-14 DIAGNOSIS — G43909 Migraine, unspecified, not intractable, without status migrainosus: Secondary | ICD-10-CM | POA: Insufficient documentation

## 2016-05-14 DIAGNOSIS — Z7982 Long term (current) use of aspirin: Secondary | ICD-10-CM | POA: Insufficient documentation

## 2016-05-14 DIAGNOSIS — Z79899 Other long term (current) drug therapy: Secondary | ICD-10-CM | POA: Diagnosis not present

## 2016-05-14 DIAGNOSIS — R202 Paresthesia of skin: Secondary | ICD-10-CM | POA: Insufficient documentation

## 2016-05-14 DIAGNOSIS — J45909 Unspecified asthma, uncomplicated: Secondary | ICD-10-CM | POA: Diagnosis not present

## 2016-05-14 NOTE — ED Triage Notes (Signed)
Patient c/o migraine (hx of same) since 2 am yesterday.  Patient also c/o low back pain with known hx of bulging lumbar and cervical spine discs.  Patient c/o numbness/tingling lower extremities, but denies urinary and fecal incontinence.  Patient is under care at [facility] for disc pain.  Patient's pain management doctor left and she is now awaiting a referral from PCP for new pain management clinic.    Patient has treated migraine pain with Excedrin migraine with no relief.

## 2016-05-14 NOTE — ED Triage Notes (Signed)
Pt gave front desk clerk labels and left.

## 2016-06-18 ENCOUNTER — Other Ambulatory Visit: Payer: Self-pay

## 2016-06-18 DIAGNOSIS — G40209 Localization-related (focal) (partial) symptomatic epilepsy and epileptic syndromes with complex partial seizures, not intractable, without status epilepticus: Secondary | ICD-10-CM

## 2016-06-18 DIAGNOSIS — G43101 Migraine with aura, not intractable, with status migrainosus: Secondary | ICD-10-CM

## 2016-06-18 MED ORDER — LAMOTRIGINE 100 MG PO TABS: 100.0000 mg | ORAL_TABLET | Freq: Two times a day (BID) | ORAL | 0 refills | Status: DC

## 2016-06-28 ENCOUNTER — Emergency Department (HOSPITAL_COMMUNITY): Payer: BC Managed Care – PPO

## 2016-06-28 ENCOUNTER — Encounter (HOSPITAL_COMMUNITY): Payer: Self-pay | Admitting: Emergency Medicine

## 2016-06-28 ENCOUNTER — Emergency Department (HOSPITAL_COMMUNITY)
Admission: EM | Admit: 2016-06-28 | Discharge: 2016-06-29 | Disposition: A | Discharge status: Home / Self Care | Payer: BC Managed Care – PPO | Attending: Emergency Medicine | Admitting: Emergency Medicine

## 2016-06-28 DIAGNOSIS — Z8673 Personal history of transient ischemic attack (TIA), and cerebral infarction without residual deficits: Secondary | ICD-10-CM | POA: Insufficient documentation

## 2016-06-28 DIAGNOSIS — R0789 Other chest pain: Secondary | ICD-10-CM | POA: Diagnosis not present

## 2016-06-28 DIAGNOSIS — Z7982 Long term (current) use of aspirin: Secondary | ICD-10-CM | POA: Diagnosis not present

## 2016-06-28 DIAGNOSIS — J45909 Unspecified asthma, uncomplicated: Secondary | ICD-10-CM | POA: Diagnosis not present

## 2016-06-28 DIAGNOSIS — Z79899 Other long term (current) drug therapy: Secondary | ICD-10-CM | POA: Diagnosis not present

## 2016-06-28 DIAGNOSIS — R0602 Shortness of breath: Secondary | ICD-10-CM | POA: Diagnosis present

## 2016-06-28 LAB — BASIC METABOLIC PANEL
Anion gap: 7 (ref 5–15)
BUN: 14 mg/dL (ref 6–20)
CHLORIDE: 110 mmol/L (ref 101–111)
CO2: 23 mmol/L (ref 22–32)
CREATININE: 1.03 mg/dL — AB (ref 0.44–1.00)
Calcium: 9 mg/dL (ref 8.9–10.3)
GFR calc non Af Amer: >60 mL/min (ref >60)
GLUCOSE: 117 mg/dL — AB (ref 65–99)
Potassium: 3.1 mmol/L — ABNORMAL LOW (ref 3.5–5.1)
Sodium: 140 mmol/L (ref 135–145)

## 2016-06-28 LAB — CBC
HCT: 39 % (ref 36.0–46.0)
HEMOGLOBIN: 12.8 g/dL (ref 12.0–15.0)
MCH: 27.1 pg (ref 26.0–34.0)
MCHC: 32.8 g/dL (ref 30.0–36.0)
MCV: 82.6 fL (ref 78.0–100.0)
PLATELETS: 181 10*3/uL (ref 150–400)
RBC: 4.72 MIL/uL (ref 3.87–5.11)
RDW: 14.4 % (ref 11.5–15.5)
WBC: 10.1 10*3/uL (ref 4.0–10.5)

## 2016-06-28 LAB — I-STAT TROPONIN, ED: Troponin i, poc: 0 ng/mL (ref 0.00–0.08)

## 2016-06-28 MED ORDER — ALBUTEROL SULFATE (2.5 MG/3ML) 0.083% IN NEBU
5.0000 mg | INHALATION_SOLUTION | Freq: Once | RESPIRATORY_TRACT | Status: AC
  Administered 2016-06-28: 5 mg via RESPIRATORY_TRACT
  Filled 2016-06-28: qty 6

## 2016-06-28 NOTE — ED Triage Notes (Signed)
Pt presents with right sided chest pain that radiates to the back since last night, worsening throughout today.  Pt also c/o of shortness of breath, nausea, vomiting x 2 in the last hour, and dizziness.  Pt states that she has a history of a-fib.  Pt alert and oriented x 4.

## 2016-06-29 ENCOUNTER — Encounter (HOSPITAL_COMMUNITY): Payer: Self-pay

## 2016-06-29 ENCOUNTER — Emergency Department (HOSPITAL_COMMUNITY): Payer: BC Managed Care – PPO

## 2016-06-29 LAB — I-STAT TROPONIN, ED: TROPONIN I, POC: 0 ng/mL (ref 0.00–0.08)

## 2016-06-29 MED ORDER — IOPAMIDOL (ISOVUE-370) INJECTION 76%
100.0000 mL | Freq: Once | INTRAVENOUS | Status: AC | PRN
  Administered 2016-06-29: 100 mL via INTRAVENOUS

## 2016-06-29 MED ORDER — IOPAMIDOL (ISOVUE-300) INJECTION 61%: 100.0000 mL | Freq: Once | INTRAVENOUS | Status: DC | PRN

## 2016-06-29 MED ORDER — KETOROLAC TROMETHAMINE 30 MG/ML IJ SOLN
30.0000 mg | Freq: Once | INTRAMUSCULAR | Status: AC
  Administered 2016-06-29: 30 mg via INTRAVENOUS
  Filled 2016-06-29: qty 1

## 2016-06-29 MED ORDER — SODIUM CHLORIDE 0.9 % IV BOLUS (SEPSIS)
1000.0000 mL | Freq: Once | INTRAVENOUS | Status: AC
  Administered 2016-06-29: 1000 mL via INTRAVENOUS

## 2016-06-29 MED ORDER — POTASSIUM CHLORIDE CRYS ER 20 MEQ PO TBCR
40.0000 meq | EXTENDED_RELEASE_TABLET | Freq: Once | ORAL | Status: AC
  Administered 2016-06-29: 40 meq via ORAL
  Filled 2016-06-29: qty 2

## 2016-06-29 NOTE — ED Notes (Signed)
Pt returned from CT holding chest c/o chest hurts. She states that she feels like she went back in A-Fib. Pt was placed back on cardiac monitor. Assessed rhythm at NSR, VSS. Pt rates pain 9/10.

## 2016-06-29 NOTE — ED Notes (Signed)
Patient transported to CT 

## 2016-06-29 NOTE — ED Provider Notes (Signed)
[facility] DEPT Provider Note   CSN: [id number] Arrival date & time: 06/28/16  2143  By signing my name below, I, [name] [name], attest that this documentation has been prepared under the direction and in the presence of Everlene Balls, MD. Electronically Signed: Soijett [name], ED Scribe. 06/29/16. 12:27 AM.  History   Chief Complaint Chief Complaint  Patient presents with  . Chest Pain  . Shortness of Breath    HPI [name] [name] is a 50 y.o. female with a PMHx of A-fib, not on [facility], who presents to the Emergency Department complaining of heavy, sharp, right sided CP onset last night. She notes that she was watching TV when the right sided CP began. Pt notes that her right sided CP radiates to her back. She states that she is having associated symptoms of SOB, diaphoresis, nausea, and vomiting. She states that she has not tried any medications for the relief of her symptoms. She denies cough, rhinorrhea, sneezing, leg pain, leg swelling, dizziness, and any other symptoms. Pt denies taking blood thinners for her A-fib. Pt denies recent travel, immobilization, surgery, or PMHx of blood clots. Pt states that she was hospitalized in September for a TIA.    The history is provided by the patient. No language interpreter was used.    Past Medical History:  Diagnosis Date  . Anginal pain ([place])   . Anxiety   . Asthma   . Atrial fibrillation ([place])   . Back pain   . Back pain    bulging disk  . Fibroid   . Head injury   . Migraine   . Obesity, Class III, BMI 40-49.9 (morbid obesity) ([place]) 12/21/2012  . Ovarian cyst   . Seizures ([place])   . Shortness of breath dyspnea   . Surgical menopause   . TIA (transient ischemic attack)     Patient Active Problem List   Diagnosis Date Noted  . Surgical menopause on hormone replacement therapy 09/08/2015  . Incontinence in female 09/08/2015  . Back pain at L4-L5 level 09/07/2015  . DDD (degenerative disc disease), lumbosacral 09/07/2015  .  Lumbosacral radiculopathy 09/07/2015  . Migraine with aura and with status migrainosus, not intractable 07/20/2015  . Subarachnoid hemorrhage following injury, with loss of consciousness ([place]) 07/20/2015  . TBI (traumatic brain injury) ([place]) 07/20/2015  . Partial symptomatic epilepsy with complex partial seizures, not intractable, without status epilepticus ([place]) 07/20/2015  . Intractable migraine with aura with status migrainosus 04/27/2015  . Chronic post-traumatic headache, not intractable 04/27/2015  . Morbid obesity ([place])   . TIA (transient ischemic attack) 04/25/2015  . Paresthesia 04/22/2015  . CVA (cerebral infarction) 04/22/2015  . Right sided weakness 04/22/2015  . Seizure disorder ([place]) 04/22/2015  . Chronic headaches 04/22/2015  . Atrial fibrillation ([place]) 04/22/2015  . Right arm weakness   . Asthma, mild intermittent   . History of recent fall 03/09/2014  . 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 Obesity, Class III, BMI 40-49.9 (morbid obesity) ([place]) 12/21/2012  . GERD (gastroesophageal reflux disease)   . Incisional hernia, without obstruction or gangrene 11/06/2012  . Status post TAH-BSO 10/09/2003    Past Surgical History:  Procedure Laterality Date  . ABDOMINAL HYSTERECTOMY     wiht bso  . CARDIAC CATHETERIZATION N/A 01/21/2015   Procedure: Left Heart Cath;  Surgeon: Yolonda Kida, MD;  Location: [facility];  Service: Cardiovascular;  Laterality: N/A;  . CESAREAN SECTION    . COLONOSCOPY N/A 11/21/2012   Procedure:  COLONOSCOPY;  Surgeon: Irene Shipper, MD;  Location: [facility];  Service: Endoscopy;  Laterality: N/A;  . DILATION AND CURETTAGE OF UTERUS    . ESOPHAGOGASTRODUODENOSCOPY N/A 11/21/2012   Procedure: ESOPHAGOGASTRODUODENOSCOPY (EGD);  Surgeon: Irene Shipper, MD;  Location: [facility];  Service: Endoscopy;  Laterality: N/A;  . HERNIA REPAIR  12/17/12   laparoscopic incisional   . INCISIONAL HERNIA REPAIR N/A 12/17/2012    Procedure: LAPAROSCOPIC INCISIONAL HERNIA;  Surgeon: Harl Bowie, MD;  Location: [facility];  Service: General;  Laterality: N/A;  . INSERTION OF MESH N/A 12/17/2012   Procedure: INSERTION OF MESH;  Surgeon: Harl Bowie, MD;  Location: [facility];  Service: General;  Laterality: N/A;    OB History    No data available       Home Medications    Prior to Admission medications   Medication Sig Start Date End Date Taking? Authorizing Provider  albuterol (PROVENTIL HFA;VENTOLIN HFA) 108 (90 BASE) MCG/ACT inhaler Inhale 2 puffs into the lungs every 6 (six) hours as needed for wheezing.   Yes Historical Provider, MD  clonazePAM (KLONOPIN) 1 MG tablet Take 1 mg by mouth at bedtime as needed (for sleep and if she feels a seizure coming on).   Yes Historical Provider, MD  lamoTRIgine (LAMICTAL) 100 MG tablet Take 1 tablet (100 mg total) by mouth 2 (two) times daily. 06/18/16  Yes Asencion Partridge Dohmeier, MD  metoprolol tartrate (LOPRESSOR) 25 MG tablet Take 1 tablet (25 mg total) by mouth 2 (two) times daily. Patient taking differently: Take 25 mg by mouth daily.  04/25/15  Yes Domenic Polite, MD  topiramate (TOPAMAX) 100 MG tablet TAKE 1 TABLET BY MOUTH 2 TIMES DAILY 02/29/16  Yes Larey Seat, MD  Vitamin D, Ergocalciferol, (DRISDOL) 50000 units CAPS capsule TAKE 1 CAPSULE BY MOUTH ONCE A WEEK. 08/31/15  Yes Historical Provider, MD  aspirin EC 325 MG tablet Take 1 tablet (325 mg total) by mouth daily. Patient not taking: Reported on 06/28/2016 04/25/15   Domenic Polite, MD  gabapentin (NEURONTIN) 100 MG capsule Take 1 capsule (100 mg total) by mouth at bedtime. Patient not taking: Reported on 06/28/2016 01/18/16   Ward Givens, NP  LORazepam (ATIVAN) 0.5 MG tablet Take 1/2 tablet before scan. Patient not taking: Reported on 02/05/2016 01/04/16   Amy S [place], PA-C    Family History Family History  Problem Relation Age of Onset  . Hypertension Mother   . Depression Mother   . Heart disease Mother       deceased 2014-02-18  . Heart attack Father   . Hypertension Father   . Emphysema Father   . COPD Father   . Heart disease Father   . Breast cancer Cousin   . Cancer Neg Hx   . Colon cancer Neg Hx   . Esophageal cancer Neg Hx   . Stomach cancer Neg Hx   . Pancreatic cancer Neg Hx   . Liver disease Neg Hx     Social History Social History  Substance Use Topics  . Smoking status: Never Smoker  . Smokeless tobacco: Never Used  . Alcohol use 0.0 oz/week     Allergies   Amoxicillin; Biaxin [clarithromycin]; Erythromycin base; Penicillins; and Zithromax [azithromycin]   Review of Systems Review of Systems A complete 10 system review of systems was obtained and all systems are negative except as noted in the HPI and PMH.   Physical Exam Updated Vital Signs BP 118/71 (BP Location: Right Arm)  Pulse 79   Temp 98.3 F (36.8 C) (Oral)   Resp 20   Ht 5\' 2"  (1.575 m)   Wt 281 lb (127.5 kg)   SpO2 98%   BMI 51.40 kg/m   Physical Exam  Constitutional: She is oriented to person, place, and time. She appears well-developed and well-nourished. No distress.  Obesity  HENT:  Head: Normocephalic and atraumatic.  Nose: Nose normal.  Mouth/Throat: Oropharynx is clear and moist. No oropharyngeal exudate.  Eyes: Conjunctivae and EOM are normal. Pupils are equal, round, and reactive to light. No scleral icterus.  Neck: Normal range of motion. Neck supple. No JVD present. No tracheal deviation present. No thyromegaly present.  Cardiovascular: Normal rate, regular rhythm and normal heart sounds.  Exam reveals no gallop and no friction rub.   No murmur heard. Pulmonary/Chest: Effort normal and breath sounds normal. No respiratory distress. She has no wheezes. She exhibits no tenderness.  Abdominal: Soft. Bowel sounds are normal. She exhibits no distension and no mass. There is no tenderness. There is no rebound and no guarding.  Musculoskeletal: Normal range of motion. She exhibits no  edema or tenderness.  Lymphadenopathy:    She has no cervical adenopathy.  Neurological: She is alert and oriented to person, place, and time. No cranial nerve deficit. She exhibits normal muscle tone.  Skin: Skin is warm and dry. No rash noted. No erythema. No pallor.  Nursing note and vitals reviewed.   ED Treatments / Results  DIAGNOSTIC STUDIES: Oxygen Saturation is 99% on RA, nl by my interpretation.    COORDINATION OF CARE: 12:16 AM Discussed treatment plan with pt at bedside which includes labs, CXR, EKG, breathing treatment, CT chest, and pt agreed to plan.   Labs (all labs ordered are listed, but only abnormal results are displayed) Labs Reviewed  BASIC METABOLIC PANEL - Abnormal; Notable for the following:       Result Value   Potassium 3.1 (*)    Glucose, Bld 117 (*)    Creatinine, Ser 1.03 (*)    All other components within normal limits  CBC  I-STAT TROPOININ, ED  I-STAT TROPOININ, ED    EKG  EKG Interpretation  Date/Time:  Friday June 29 2016 02:01:30 EDT Ventricular Rate:  77 PR Interval:    QRS Duration: 94 QT Interval:  398 QTC Calculation: 451 R Axis:   72 Text Interpretation:  Sinus rhythm Interpretation limited secondary to artifact No significant change since last tracing Confirmed by Glynn Octave [phone]) on 06/29/2016 2:28:53 AM       Radiology Dg Chest 2 View  Result Date: 06/28/2016 CLINICAL DATA:  Mid chest pain that radiates to the back EXAM: CHEST  2 VIEW COMPARISON:  02/05/2016 FINDINGS: The heart size and mediastinal contours are within normal limits. Both lungs are clear. The visualized skeletal structures are unremarkable. IMPRESSION: No active cardiopulmonary disease. Electronically Signed   By: Donavan Foil M.D.   On: 06/28/2016 22:10   Ct Angio Chest Pe W Or Wo Contrast  Result Date: 06/29/2016 CLINICAL DATA:  Acute onset of right-sided chest pain, radiating to the back. Shortness of breath and diaphoresis. Initial  encounter. EXAM: CT ANGIOGRAPHY CHEST WITH CONTRAST TECHNIQUE: Multidetector CT imaging of the chest was performed using the standard protocol during bolus administration of intravenous contrast. Multiplanar CT image reconstructions and MIPs were obtained to evaluate the vascular anatomy. CONTRAST:  100 mL of Isovue 370 IV contrast COMPARISON:  Chest radiograph performed 06/28/2016, and CTA of the  chest performed 10/01/2014 FINDINGS: Cardiovascular: There is no evidence of pulmonary embolus. The heart is mildly enlarged. The thoracic aorta is unremarkable in appearance. No calcific atherosclerotic disease is seen. A retroesophageal right subclavian artery is noted. Mediastinum/Nodes: The mediastinum is otherwise unremarkable. No pericardial effusion is identified. No mediastinal lymphadenopathy is identified. A small subcentimeter hypodensity at the right thyroid lobe is likely benign, given its size. No axillary lymphadenopathy is seen. Lungs/Pleura: Minimal bilateral atelectasis is noted. The lungs are otherwise clear. No pleural effusion or pneumothorax is seen. No masses are identified. Upper Abdomen: The visualized portions of the liver and spleen are unremarkable. Musculoskeletal: No acute osseous abnormalities are identified. The visualized musculature is unremarkable in appearance. Review of the MIP images confirms the above findings. IMPRESSION: 1. No evidence of pulmonary embolus. 2. Minimal bilateral atelectasis noted.  Lungs otherwise clear. 3. Mild cardiomegaly. Electronically Signed   By: Garald Balding M.D.   On: 06/29/2016 01:57    Procedures Procedures (including critical care time)  Medications Ordered in ED Medications  iopamidol (ISOVUE-300) 61 % injection 100 mL (not administered)  albuterol (PROVENTIL) (2.5 MG/3ML) 0.083% nebulizer solution 5 mg (5 mg Nebulization Given 06/28/16 2209)  potassium chloride SA (K-DUR,KLOR-CON) CR tablet 40 mEq (40 mEq Oral Given 06/29/16 0110)  sodium  chloride 0.9 % bolus 1,000 mL (1,000 mLs Intravenous New Bag/Given 06/29/16 0114)  ketorolac (TORADOL) 30 MG/ML injection 30 mg (30 mg Intravenous Given 06/29/16 0108)  iopamidol (ISOVUE-370) 76 % injection 100 mL (100 mLs Intravenous Contrast Given 06/29/16 0121)     Initial Impression / Assessment and Plan / ED Course  I have reviewed the triage vital signs and the nursing notes.  Pertinent labs & imaging results that were available during my care of the patient were reviewed by me and considered in my medical decision making (see chart for details).  Clinical Course   Patient presents to the ED for R sided chest pain with radiation to her back with SOB.  She has a h/o of PAF, not on AC, so she is at risk for PE.  Will obtain CT for evaluation.  EKG does not show ischemia and initial troponin is negative.  Will repeat in 3 hours.  She was given toradol for pain control.  Will continue to closely monitor.    2:29 AM CTA negative for PE.  Repeat troponin and EKG are pending.  If normal, patient will be safe for DC home.  2:29 AM Repeat troponin and EKGare unchanged.  FU with PCP within 3 days.  Patient and husband demonstrate good understanding.  Her VS remain within her normal limits and she is safe for DC.  Final Clinical Impressions(s) / ED Diagnoses   Final diagnoses:  None    New Prescriptions New Prescriptions   No medications on file      I personally performed the services described in this documentation, which was scribed in my presence. The recorded information has been reviewed and is accurate.      Everlene Balls, MD 06/29/16 [phone]

## 2016-07-23 ENCOUNTER — Telehealth: Payer: Self-pay

## 2016-07-23 ENCOUNTER — Encounter: Payer: Self-pay | Admitting: Neurology

## 2016-07-23 ENCOUNTER — Ambulatory Visit (INDEPENDENT_AMBULATORY_CARE_PROVIDER_SITE_OTHER): Payer: BC Managed Care – PPO | Admitting: Neurology

## 2016-07-23 VITALS — BP 140/88 | HR 60 | Resp 20 | Ht 62.5 in | Wt 283.0 lb

## 2016-07-23 DIAGNOSIS — G44021 Chronic cluster headache, intractable: Secondary | ICD-10-CM

## 2016-07-23 DIAGNOSIS — G43101 Migraine with aura, not intractable, with status migrainosus: Secondary | ICD-10-CM

## 2016-07-23 DIAGNOSIS — M542 Cervicalgia: Secondary | ICD-10-CM | POA: Diagnosis not present

## 2016-07-23 DIAGNOSIS — E669 Obesity, unspecified: Secondary | ICD-10-CM

## 2016-07-23 DIAGNOSIS — G40209 Localization-related (focal) (partial) symptomatic epilepsy and epileptic syndromes with complex partial seizures, not intractable, without status epilepticus: Secondary | ICD-10-CM

## 2016-07-23 MED ORDER — TOPIRAMATE 100 MG PO TABS: 100.0000 mg | ORAL_TABLET | Freq: Two times a day (BID) | ORAL | 11 refills | Status: DC

## 2016-07-23 MED ORDER — LAMOTRIGINE 100 MG PO TABS: 100.0000 mg | ORAL_TABLET | Freq: Two times a day (BID) | ORAL | 0 refills | Status: DC

## 2016-07-23 MED ORDER — DIHYDROERGOTAMINE MESYLATE 4 MG/ML NA SOLN: 1.0000 | NASAL | 12 refills | Status: DC | PRN

## 2016-07-23 MED ORDER — VALPROATE SODIUM 500 MG/5ML IV SOLN: 500.0000 mg | Freq: Once | INTRAVENOUS | Status: DC

## 2016-07-23 MED ORDER — PROMETHAZINE HCL 50 MG PO TABS
25.0000 mg | ORAL_TABLET | Freq: Four times a day (QID) | ORAL | 0 refills | Status: DC | PRN
Start: 2016-07-23 — End: 2016-12-26

## 2016-07-23 NOTE — Addendum Note (Signed)
Addended by: Larey Seat on: 07/23/2016 03:24 PM   Modules accepted: Orders

## 2016-07-23 NOTE — Addendum Note (Signed)
Addended by: Lester [place] A on: 07/23/2016 03:29 PM   Modules accepted: Orders

## 2016-07-23 NOTE — Telephone Encounter (Signed)
Will order phenergan po. 25 mg tab.

## 2016-07-23 NOTE — Telephone Encounter (Signed)
I spoke to pt and advised her that Dr. Brett Fairy ordered phenergan 25 mg as needed every 6 hours as needed for nausea and vomiting. Potential medication side effects were discussed with the patient. Pt verbalized understanding.

## 2016-07-23 NOTE — Progress Notes (Addendum)
PATIENT: [name] [name] DOB: 03/24/1966  REASON FOR VISIT: follow up- migraine, TBI, Seizures HISTORY FROM: patient  HISTORY OF PRESENT ILLNESS: [name] [name] is a 50 year old female with a history of traumatic brain injury migraines and seizures. I have listed below a multitude of medications for her migraines -all of which have been unsuccessful.  She is currently on Topamax 200 mg daily and Lamictal 100 mg twice a day. These medications often help headaches as well but has failed her. Topiramate and Lamictal are used for seizure control in her case. She has not had a seizure in almost 7 years. She states that she continues to have daily headaches. Her headache location can vary. She does confirm photophobia, phonophobia, nausea and vomiting. She has tried Botox injections but reports she had an allergic reaction- swelling around the lips. Patient reports that she has not had any seizure events. She continues to have dizzy episodes. She had a very difficult time the last 2-1/2 years since she suffered a traumatic brain injury, which exacerbated her headaches. She remains morbidly obese.   HISTORY 07/20/15: [name] [name] is a 50 y.o. female here as a referral from Dr. Kary Kos for Migraines and to follow up on Botox treatment. She only as treated once, developed lip swelling.  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 Migraines since a child at the age of 89. Migraines are all over the head, throbbing and pounding. She endorses light sensitivity, she lays in her room in the dark, staying still helps, smells trigger the migraine, she gets nausea and vomiting. She is having the headaches on average 20 headache days a month, over 15 a month are migrainous, for years. They last 24 hours or longer. She has had a migraines for 4 days now and is a 7-8/10. The pain can become 10/10. She has tried multiple  medications.  [name] [name] has undergoneBotox injections - the first one- just yesterday with Dr. Jaynee Eagles.  These have helped significantly she is able to move her neck range of motion is not as limited the degree of headaches is much reduced and the frequency as well. She will need today some refills on her medication, namely lamotrigine. She also appears less depressed than when I saw her last.  REVIEW OF SYSTEMS: Out of a complete 14 system review of symptoms, the patient complains only of the following symptoms, and all other reviewed systems are negative.  Swollen abdomen, abdominal pain, constipation, diarrhea, nausea, vomiting, incontinence of bowels, back pain, dizziness, headache, wheezing, shortness of breath, appetite change  ALLERGIES: Allergies  Allergen Reactions  . Amoxicillin Hives and Swelling  . Biaxin [Clarithromycin] Hives  . Erythromycin Base Hives  . Penicillins Hives and Swelling  . Zithromax [Azithromycin] Hives and Nausea Only    HOME MEDICATIONS: Outpatient Medications Prior to Visit  Medication Sig Dispense Refill  . albuterol (PROVENTIL HFA;VENTOLIN HFA) 108 (90 BASE) MCG/ACT inhaler Inhale 2 puffs into the lungs every 6 (six) hours as needed for wheezing.    Marland Kitchen aspirin EC 325 MG tablet Take 1 tablet (325 mg total) by mouth daily.  0  . clonazePAM (KLONOPIN) 1 MG tablet Take 1 mg by mouth at bedtime as needed (for sleep and if she feels a seizure coming on).    . lamoTRIgine (LAMICTAL) 100 MG tablet Take 1 tablet (100 mg total) by mouth 2 (two) times daily. 180 tablet 0  . metoprolol tartrate (LOPRESSOR)  25 MG tablet Take 1 tablet (25 mg total) by mouth 2 (two) times daily. (Patient taking differently: Take 25 mg by mouth daily. ) 60 tablet 0  . topiramate (TOPAMAX) 100 MG tablet TAKE 1 TABLET BY MOUTH 2 TIMES DAILY 60 tablet 11  . Vitamin D, Ergocalciferol, (DRISDOL) 50000 units CAPS capsule TAKE 1 CAPSULE BY MOUTH ONCE A WEEK.  5   Facility-Administered  Medications Prior to Visit  Medication Dose Route Frequency Provider Last Rate Last Dose  . fentaNYL (SUBLIMAZE) injection 50 mcg  50 mcg Intravenous UD Lance Bosch, MD      . iopamidol (ISOVUE-M) 41 % intrathecal injection 20 mL  20 mL Epidural PRN Lance Bosch, MD      . midazolam (VERSED) 5 MG/5ML injection 1 mg  1 mg Intravenous UD Lance Bosch, MD      . triamcinolone acetonide (KENALOG-40) injection 80 mg  80 mg Intramuscular Once Lance Bosch, MD        PAST MEDICAL HISTORY: Past Medical History:  Diagnosis Date  . Anginal pain ([place])   . Anxiety   . Asthma   . Atrial fibrillation ([place])   . Back pain   . Back pain    bulging disk  . Fibroid   . Head injury   . Migraine   . Obesity, Class III, BMI 40-49.9 (morbid obesity) ([place]) 12/21/2012  . Ovarian cyst   . Seizures ([place])   . Shortness of breath dyspnea   . Surgical menopause   . TIA (transient ischemic attack)     PAST SURGICAL HISTORY: Past Surgical History:  Procedure Laterality Date  . ABDOMINAL HYSTERECTOMY     wiht bso  . CARDIAC CATHETERIZATION N/A 01/21/2015   Procedure: Left Heart Cath;  Surgeon: Yolonda Kida, MD;  Location: [facility];  Service: Cardiovascular;  Laterality: N/A;  . CESAREAN SECTION    . COLONOSCOPY N/A 11/21/2012   Procedure: COLONOSCOPY;  Surgeon: Irene Shipper, MD;  Location: [facility];  Service: Endoscopy;  Laterality: N/A;  . DILATION AND CURETTAGE OF UTERUS    . ESOPHAGOGASTRODUODENOSCOPY N/A 11/21/2012   Procedure: ESOPHAGOGASTRODUODENOSCOPY (EGD);  Surgeon: Irene Shipper, MD;  Location: [facility];  Service: Endoscopy;  Laterality: N/A;  . HERNIA REPAIR  12/17/12   laparoscopic incisional   . INCISIONAL HERNIA REPAIR N/A 12/17/2012   Procedure: LAPAROSCOPIC INCISIONAL HERNIA;  Surgeon: Harl Bowie, MD;  Location: [facility];  Service: General;  Laterality: N/A;  . INSERTION OF MESH N/A 12/17/2012   Procedure: INSERTION OF MESH;  Surgeon: Harl Bowie,  MD;  Location: [facility];  Service: General;  Laterality: N/A;    FAMILY HISTORY: Family History  Problem Relation Age of Onset  . Hypertension Mother   . Depression Mother   . Heart disease Mother     deceased 01/28/14  . Heart attack Father   . Hypertension Father   . Emphysema Father   . COPD Father   . Heart disease Father   . Breast cancer Cousin   . Cancer Neg Hx   . Colon cancer Neg Hx   . Esophageal cancer Neg Hx   . Stomach cancer Neg Hx   . Pancreatic cancer Neg Hx   . Liver disease Neg Hx     SOCIAL HISTORY: Social History   Social History  . Marital status: Divorced    Spouse name: N/A  . Number of children: 2  . Years of education: 12+   Occupational History  .  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 OFFICE ASSISTANT  [place]   Social History Main Topics  . Smoking status: Never Smoker  . Smokeless tobacco: Never Used  . Alcohol use 0.0 oz/week  . Drug use: No  . Sexual activity: Yes    Birth control/ protection: Surgical   Other Topics Concern  . Not on file   Social History Narrative   Patient is single with 2 children   Patient is right handed   Patient has some college   Patient does not drink caffeine            PHYSICAL EXAM  Vitals:   07/23/16 1429  BP: 140/88  Pulse: 60  Resp: 20  Weight: 283 lb (128.4 kg)  Height: 5' 2.5" (1.588 m)   Body mass index is 50.94 kg/m.  Generalized: Well developed, in no acute distress, but morbidly obese Neurological examination  Mentation: Alert oriented to time, place, history taking. Follows all commands speech and language fluent. Cranial nerve :  No loss of taste or smell. Pupils were equal round reactive to light.  Extraocular movements were full, visual field were full on confrontational test. Facial sensation and strength were normal. Uvula tongue midline. Head turning and shoulder shrug  were normal and symmetric. Motor:  5 over 5 strength of all 4  extremities, symmetric motor tone is noted throughout.  Sensory: intact to soft touch on all 4 extremities. No evidence of extinction is noted.  Coordination: Cerebellar testing reveals good finger-nose-finger  bilaterally.  Gait and station: Gait is normal.  No drift is seen. Wider based gait due to obesity.   Reflexes: Deep tendon reflexes are symmetric  bilaterally.   DIAGNOSTIC DATA (LABS, IMAGING, TESTING) - I reviewed patient records, labs, notes, testing and imaging myself where available.  Lab Results  Component Value Date   WBC 10.1 06/28/2016   HGB 12.8 06/28/2016   HCT 39.0 06/28/2016   MCV 82.6 06/28/2016   PLT 181 06/28/2016      Component Value Date/Time   NA 140 06/28/2016 2152   NA 142 01/18/2016 1456   NA 140 12/01/2014 1109   K 3.1 (L) 06/28/2016 2152   K 3.8 12/01/2014 1109   CL 110 06/28/2016 2152   CL 111 12/01/2014 1109   CO2 23 06/28/2016 2152   CO2 23 12/01/2014 1109   GLUCOSE 117 (H) 06/28/2016 2152   GLUCOSE 99 12/01/2014 1109   BUN 14 06/28/2016 2152   BUN 10 01/18/2016 1456   BUN 17 12/01/2014 1109   CREATININE 1.03 (H) 06/28/2016 2152   CREATININE 0.83 12/01/2014 1109   CREATININE 0.82 06/22/2013 1152   CALCIUM 9.0 06/28/2016 2152   CALCIUM 8.7 (L) 12/01/2014 1109   PROT 7.1 01/18/2016 1456   PROT 7.7 09/26/2014 1837   ALBUMIN 4.0 01/18/2016 1456   ALBUMIN 3.5 09/26/2014 1837   AST 11 01/18/2016 1456   AST 17 09/26/2014 1837   ALT 16 01/18/2016 1456   ALT 24 09/26/2014 1837   ALKPHOS 113 01/18/2016 1456   ALKPHOS 116 09/26/2014 1837   BILITOT 0.3 01/18/2016 1456   BILITOT 0.2 09/26/2014 1837   GFRNONAA >60 06/28/2016 2152   GFRNONAA >60 12/01/2014 1109   GFRAA >60 06/28/2016 2152   GFRAA >60 12/01/2014 1109   Lab Results  Component Value Date   CHOL 161 10/12/2015   HDL 47 10/12/2015   LDLCALC 100 (H) 10/12/2015   TRIG 71 10/12/2015   CHOLHDL 3.4 10/12/2015  Lab Results  Component Value Date   HGBA1C 5.6 10/12/2015     Lab Results  Component Value Date   TSH 0.736 10/12/2015      ASSESSMENT AND PLAN 50 y.o. year old female  has a past medical history of Anginal pain ([place]); Anxiety; Asthma; Atrial fibrillation ([place]); Back pain; Back pain; Fibroid; Head injury; Migraine; Obesity, Class III, BMI 40-49.9 (morbid obesity) ([place]) (12/21/2012); Ovarian cyst; Seizures ([place]); Shortness of breath dyspnea; Surgical menopause; and TIA (transient ischemic attack). here with:  1. Seizures, last active in 2010 2. History of traumatic brain injury in 2015 3. 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  4. Atrial fibrillation. Rate controlled on metoprolol 5.  morbid obesity, she tested negative for sleep apnea.    The patient has not had any seizure events for many years.  She will continue on Topamax and Lamictal. I will check blood work today, but only CBC and CMET, no levels.  Referral to obesity management clinic, Dr Leafy Ro.  The patient has been on a multitude of medications for migraine headaches with no success. She has triptans in the past, Imitrex gave her a feeling of her throat closing up and produced anxiety. she has tried Depakote by mouth but gained a significant amount of weight and has trouble ever since to lose it again., she failed gabapentin at a small dose of 100 mg at bedtime. I will ask our infusion nurse today for 1000 mg of Depakote infusion, I explained to the patient that the Depakote infusion cannot lead to weight gain but the oral form certainly has. Her headaches also seem to be correlated to the traumatic exacerbation when she suffered a head injury during a visit at [facility]. I will refer her for an alternative to Botox evaluation with Dr. Jaynee Eagles. Since the patient has significant cervicalgia, neck stiffness, occipital tenderness and migrainous headaches associated with photophobia and nausea she may be a good candidate. I have reviewed the potential side effects with the  patient. The patient had one Botox treatment in 2016 and reported that her lips were swelling afterwards. I wonder if she could be a candidate for another form of Botoxin.  Patient is not interested in being referred to another headache specialist, but I have mentioned Dr Beatris Si. I have given the patient a coupon for Migranal nasal spray, hoping that this will be better tolerated. Her insurance carrier usually does not pay for the brand name but this coupon should be allowing her for co-pay of $5.   . Patient advised that if her symptoms worsen or she develops any new symptoms she should let us know. She will follow-up in 6 months or sooner if needed  [facility], MD     07/23/2016, 2:59 PM [facility] [address] [place],  [address] [phone]

## 2016-07-23 NOTE — Telephone Encounter (Signed)
Pt asked me after her infusion of depakote for Dr. Brett Fairy to order nausea medication for her. I advised pt that I would send this request to Dr. Brett Fairy.

## 2016-07-24 LAB — CBC
Hematocrit: 38 % (ref 34.0–46.6)
Hemoglobin: 12.9 g/dL (ref 11.1–15.9)
MCH: 27 pg (ref 26.6–33.0)
MCHC: 33.9 g/dL (ref 31.5–35.7)
MCV: 80 fL (ref 79–97)
PLATELETS: 195 10*3/uL (ref 150–379)
RBC: 4.78 x10E6/uL (ref 3.77–5.28)
RDW: 15.7 % — ABNORMAL HIGH (ref 12.3–15.4)
WBC: 7.1 10*3/uL (ref 3.4–10.8)

## 2016-07-24 LAB — COMPREHENSIVE METABOLIC PANEL
ALK PHOS: 105 IU/L (ref 39–117)
ALT: 13 IU/L (ref 0–32)
AST: 13 IU/L (ref 0–40)
Albumin/Globulin Ratio: 1.3 (ref 1.2–2.2)
Albumin: 3.9 g/dL (ref 3.5–5.5)
BILIRUBIN TOTAL: 0.3 mg/dL (ref 0.0–1.2)
BUN/Creatinine Ratio: 16 (ref 9–23)
BUN: 12 mg/dL (ref 6–24)
CHLORIDE: 106 mmol/L (ref 96–106)
CO2: 24 mmol/L (ref 18–29)
CREATININE: 0.77 mg/dL (ref 0.57–1.00)
Calcium: 8.7 mg/dL (ref 8.7–10.2)
GFR calc Af Amer: 105 mL/min/{1.73_m2} (ref >59)
GFR calc non Af Amer: 91 mL/min/{1.73_m2} (ref >59)
GLOBULIN, TOTAL: 3 g/dL (ref 1.5–4.5)
Glucose: 95 mg/dL (ref 65–99)
POTASSIUM: 3.9 mmol/L (ref 3.5–5.2)
SODIUM: 143 mmol/L (ref 134–144)
Total Protein: 6.9 g/dL (ref 6.0–8.5)

## 2016-07-25 ENCOUNTER — Telehealth: Payer: Self-pay

## 2016-07-25 NOTE — Telephone Encounter (Signed)
I spoke to pt and advised her that her labs were normal. Pt verbalized understanding of results. Pt had no questions at this time but was encouraged to call back if questions arise.

## 2016-07-25 NOTE — Telephone Encounter (Signed)
-----   Message from Larey Seat, MD sent at 07/24/2016  5:56 PM EST ----- Metabolic panel was completely normal much improved in comparison to 3 weeks ago. CBC is normal for this patient.

## 2016-09-06 ENCOUNTER — Encounter (INDEPENDENT_AMBULATORY_CARE_PROVIDER_SITE_OTHER): Payer: BC Managed Care – PPO | Admitting: Family Medicine

## 2016-09-09 ENCOUNTER — Encounter (INDEPENDENT_AMBULATORY_CARE_PROVIDER_SITE_OTHER): Payer: Self-pay | Admitting: Family Medicine

## 2016-09-10 ENCOUNTER — Encounter (INDEPENDENT_AMBULATORY_CARE_PROVIDER_SITE_OTHER): Payer: Self-pay | Admitting: Family Medicine

## 2016-09-10 ENCOUNTER — Ambulatory Visit (INDEPENDENT_AMBULATORY_CARE_PROVIDER_SITE_OTHER): Payer: BC Managed Care – PPO | Admitting: Family Medicine

## 2016-09-10 VITALS — BP 121/81 | HR 84 | Temp 98.8°F | Resp 14 | Ht 63.0 in | Wt 276.0 lb

## 2016-09-10 DIAGNOSIS — Z1389 Encounter for screening for other disorder: Secondary | ICD-10-CM

## 2016-09-10 DIAGNOSIS — R5383 Other fatigue: Secondary | ICD-10-CM | POA: Diagnosis not present

## 2016-09-10 DIAGNOSIS — Z9189 Other specified personal risk factors, not elsewhere classified: Secondary | ICD-10-CM | POA: Diagnosis not present

## 2016-09-10 DIAGNOSIS — Z0289 Encounter for other administrative examinations: Secondary | ICD-10-CM

## 2016-09-10 DIAGNOSIS — R0609 Other forms of dyspnea: Secondary | ICD-10-CM | POA: Diagnosis not present

## 2016-09-10 DIAGNOSIS — Z1331 Encounter for screening for depression: Secondary | ICD-10-CM

## 2016-09-10 DIAGNOSIS — E559 Vitamin D deficiency, unspecified: Secondary | ICD-10-CM

## 2016-09-10 NOTE — Progress Notes (Signed)
Office: [phone]  /  Fax: [phone]   HPI:   Chief Complaint: [name] [name] (MR# [id number]) is a 51 y.o. female who presents on 09/11/2016 for obesity evaluation and treatment. Current BMI is Body mass index is 48.89 kg/m.[name] [name] has struggled with obesity for years and has been unsuccessful in either losing weight or maintaining long term weight loss. [name] [name] notes weight gain after starting Depakote. Reports gaining approximately 100 lbs since then. [name] [name] has a history of migraines and epilepsy, stabilized when Depakote was discontinued. [name] [name] attended our information session and states she is currently in the action stage of change and ready to dedicate time achieving and maintaining a healthier weight.  [name] [name] states her family eats meals together she thinks her family will eat healthier with  her she started gaining weight in 2000 her heaviest weight ever was 295 lbs. she is a picky eater and doesn't like to eat healthier foods  she skips meals frequently she is frequently drinking liquids with calories she has problems with excessive hunger  she frequently eats larger portions than normal    Fatigue [name] feels her energy is lower than it should be. This has worsened with weight gain and has not worsened recently. [name] admits to daytime somnolence and  admits to waking up still tired. Patient is at risk for obstructive sleep apnea. Patent has a history of symptoms of daytime fatigue and morning headache. Patient generally gets 4 or 5 hours of sleep per night, and states they generally have generally restful sleep. Snoring is present. Apneic episodes are not present. Epworth Sleepiness Score is 4  Dyspnea on exertion [name] [name] notes increasing shortness of breath with exercising and seems to be worsening over time with weight gain. She notes getting out of breath sooner with activity than she used to. This has not gotten worse recently. [name] [name] denies orthopnea.  At  risk for diabetes [name] [name] is at higher than averagerisk for developing diabetes due to her obesity. She currently denies polyuria or polydipsia.  Depression Screen [name] [name]'s Food and Mood (modified PHQ-9) score was  Depression screen PHQ 2/9 09/10/2016  Decreased Interest 1  Down, Depressed, Hopeless 0  PHQ - 2 Score 1  Altered sleeping 0  Tired, decreased energy 1  Change in appetite 2  Feeling bad or failure about yourself  1  Trouble concentrating 2  Moving slowly or fidgety/restless 1  Suicidal thoughts 0  PHQ-9 Score 8    ALLERGIES: Allergies  Allergen Reactions  . Amoxicillin Hives and Swelling  . Biaxin [Clarithromycin] Hives  . Erythromycin Base Hives  . Penicillins Hives and Swelling  . Zithromax [Azithromycin] Hives and Nausea Only    MEDICATIONS: Current Outpatient Prescriptions on File Prior to Visit  Medication Sig Dispense Refill  . albuterol (PROVENTIL HFA;VENTOLIN HFA) 108 (90 BASE) MCG/ACT inhaler Inhale 2 puffs into the lungs every 6 (six) hours as needed for wheezing.    . clonazePAM (KLONOPIN) 1 MG tablet Take 1 mg by mouth at bedtime as needed (for sleep and if she feels a seizure coming on).    . dihydroergotamine (MIGRANAL) 4 MG/ML nasal spray Place 1 spray into the nose as needed for migraine. Use in one nostril as directed.  No more than 4 sprays in one hour 8 mL 12  . lamoTRIgine (LAMICTAL) 100 MG tablet Take 1 tablet (100 mg total) by mouth 2 (two) times daily. 180 tablet 0  . metoprolol tartrate (LOPRESSOR) 25 MG tablet Take 1 tablet (25  mg total) by mouth 2 (two) times daily. (Patient taking differently: Take 25 mg by mouth daily. ) 60 tablet 0  . promethazine (PHENERGAN) 50 MG tablet Take 0.5 tablets (25 mg total) by mouth every 6 (six) hours as needed for nausea or vomiting. 30 tablet 0  . topiramate (TOPAMAX) 100 MG tablet Take 1 tablet (100 mg total) by mouth 2 (two) times daily. 60 tablet 11  . Vitamin D, Ergocalciferol, (DRISDOL) 50000 units CAPS  capsule TAKE 1 CAPSULE BY MOUTH ONCE A WEEK.  5   Current Facility-Administered Medications on File Prior to Visit  Medication Dose Route Frequency Provider Last Rate Last Dose  . fentaNYL (SUBLIMAZE) injection 50 mcg  50 mcg Intravenous UD [name] Bosch, MD      . iopamidol (ISOVUE-M) 41 % intrathecal injection 20 mL  20 mL Epidural PRN [name] Bosch, MD      . midazolam (VERSED) 5 MG/5ML injection 1 mg  1 mg Intravenous UD [name] Bosch, MD      . triamcinolone acetonide [facility]) injection 80 mg  80 mg Intramuscular Once [name] Bosch, MD      . valproate (DEPACON) 500 mg in dextrose 5 % 50 mL IVPB  500 mg Intravenous Once [name] Seat, MD        PAST MEDICAL HISTORY: Past Medical History:  Diagnosis Date  . Anginal pain ([place])   . Anxiety   . Asthma   . Atrial fibrillation ([place])   . Back pain   . Back pain    bulging disk  . Chest pain   . Epilepsy ([place])   . Fibroid   . GERD (gastroesophageal reflux disease)   . Head injury   . Joint pain   . Migraine   . Obesity, Class III, BMI 40-49.9 (morbid obesity) ([place]) 12/21/2012  . Ovarian cyst   . Prediabetes   . Seizures ([place])   . Shortness of breath dyspnea   . Surgical menopause   . TBI (traumatic brain injury) ([place])   . TIA (transient ischemic attack)   . Vitamin D deficiency     PAST SURGICAL HISTORY: Past Surgical History:  Procedure Laterality Date  . ABDOMINAL HYSTERECTOMY     wiht bso  . CARDIAC CATHETERIZATION N/A 01/21/2015   Procedure: Left Heart Cath;  Surgeon: Yolonda Kida, MD;  Location: [facility];  Service: Cardiovascular;  Laterality: N/A;  . CESAREAN SECTION    . COLONOSCOPY N/A 11/21/2012   Procedure: COLONOSCOPY;  Surgeon: Irene Shipper, MD;  Location: [facility];  Service: Endoscopy;  Laterality: N/A;  . DILATION AND CURETTAGE OF UTERUS    . ESOPHAGOGASTRODUODENOSCOPY N/A 11/21/2012   Procedure: ESOPHAGOGASTRODUODENOSCOPY (EGD);  Surgeon: Irene Shipper, MD;  Location: [facility];  Service: Endoscopy;  Laterality: N/A;  . HERNIA REPAIR  12/17/12   laparoscopic incisional   . INCISIONAL HERNIA REPAIR N/A 12/17/2012   Procedure: LAPAROSCOPIC INCISIONAL HERNIA;  Surgeon: Harl Bowie, MD;  Location: [facility];  Service: General;  Laterality: N/A;  . INSERTION OF MESH N/A 12/17/2012   Procedure: INSERTION OF MESH;  Surgeon: Harl Bowie, MD;  Location: [facility];  Service: General;  Laterality: N/A;    SOCIAL HISTORY: Social History  Substance Use Topics  . Smoking status: Never Smoker  . Smokeless tobacco: Never Used  . Alcohol use 0.0 oz/week    FAMILY HISTORY: Family History  Problem Relation Age of Onset  . Hypertension Mother   . Depression Mother   .  Heart disease Mother     deceased 02-21-14  . Heart attack Father   . Hypertension Father   . Emphysema Father   . COPD Father   . Heart disease Father   . Diabetes Father   . Breast cancer Cousin   . Cancer Neg Hx   . Colon cancer Neg Hx   . Esophageal cancer Neg Hx   . Stomach cancer Neg Hx   . Pancreatic cancer Neg Hx   . Liver disease Neg Hx     ROS: Review of Systems  Constitutional: Positive for malaise/fatigue.  HENT: Positive for congestion and sinus pain.   Eyes:       Wear Glasses or Contacts  Respiratory: Positive for wheezing.   Cardiovascular:       Chest Tightness  Gastrointestinal: Positive for heartburn. Negative for nausea and vomiting.  Genitourinary: Negative for frequency.  Musculoskeletal: Positive for back pain and myalgias.       Negative Muscle Weakness  Neurological: Positive for headaches.  Endo/Heme/Allergies: Negative for polydipsia. Bruises/bleeds easily.  Psychiatric/Behavioral: The patient has insomnia.     PHYSICAL EXAM: Blood pressure 121/81, pulse 84, temperature 98.8 F (37.1 C), temperature source Oral, resp. rate 14, height 5\' 3"  (1.6 m), weight 276 lb (125.2 kg), SpO2 98 %. Body mass index is 48.89 kg/m. Physical Exam  Constitutional:  She is oriented to person, place, and time. She appears well-developed and well-nourished.  Cardiovascular: Normal rate.   Pulmonary/Chest: Effort normal.  Abdominal:  Morbid Obesity  Musculoskeletal: Normal range of motion.  Neurological: She is oriented to person, place, and time.  Skin: Skin is warm and dry.  Psychiatric: She has a normal mood and affect. Her behavior is normal.    RECENT LABS AND TESTS: BMET    Component Value Date/Time   NA 140 09/10/2016 1146   NA 140 12/01/2014 1109   K 4.2 09/10/2016 1146   K 3.8 12/01/2014 1109   CL 105 09/10/2016 1146   CL 111 12/01/2014 1109   CO2 19 09/10/2016 1146   CO2 23 12/01/2014 1109   GLUCOSE 106 (H) 09/10/2016 1146   GLUCOSE 117 (H) 06/28/2016 2152   GLUCOSE 99 12/01/2014 1109   BUN 12 09/10/2016 1146   BUN 17 12/01/2014 1109   CREATININE 0.83 09/10/2016 1146   CREATININE 0.83 12/01/2014 1109   CREATININE 0.82 06/22/2013 1152   CALCIUM 9.0 09/10/2016 1146   CALCIUM 8.7 (L) 12/01/2014 1109   GFRNONAA 83 09/10/2016 1146   GFRNONAA >60 12/01/2014 1109   GFRAA 96 09/10/2016 1146   GFRAA >60 12/01/2014 1109   Lab Results  Component Value Date   HGBA1C 5.9 (H) 09/10/2016   Lab Results  Component Value Date   INSULIN 36.3 (H) 09/10/2016   CBC    Component Value Date/Time   WBC 6.3 09/10/2016 1146   WBC 10.1 06/28/2016 2152   RBC 5.38 (H) 09/10/2016 1146   RBC 4.72 06/28/2016 2152   HGB 12.8 06/28/2016 2152   HGB 13.0 12/01/2014 1109   HCT 43.2 09/10/2016 1146   PLT 220 09/10/2016 1146   MCV 80 09/10/2016 1146   MCV 83 12/01/2014 1109   MCH 26.8 09/10/2016 1146   MCH 27.1 06/28/2016 2152   MCHC 33.3 09/10/2016 1146   MCHC 32.8 06/28/2016 2152   RDW 15.7 (H) 09/10/2016 1146   RDW 15.0 (H) 12/01/2014 1109   LYMPHSABS 1.0 09/10/2016 1146   MONOABS 0.5 02/05/2016 1409   EOSABS 0.0 09/10/2016 1146  BASOSABS 0.0 09/10/2016 1146   Iron/TIBC/Ferritin/ %Sat No results found for: IRON, TIBC, FERRITIN,  IRONPCTSAT Lipid Panel     Component Value Date/Time   CHOL 152 09/10/2016 1146   CHOL 141 09/27/2014 0601   TRIG 72 09/10/2016 1146   TRIG 64 09/27/2014 0601   HDL 46 09/10/2016 1146   HDL 44 09/27/2014 0601   CHOLHDL 3.4 10/12/2015 1010   CHOLHDL 3.4 04/23/2015 0414   VLDL 11 04/23/2015 0414   VLDL 13 09/27/2014 0601   LDLCALC 92 09/10/2016 1146   LDLCALC 84 09/27/2014 0601   Hepatic Function Panel     Component Value Date/Time   PROT 7.5 09/10/2016 1146   PROT 7.7 09/26/2014 1837   ALBUMIN 4.2 09/10/2016 1146   ALBUMIN 3.5 09/26/2014 1837   AST 16 09/10/2016 1146   AST 17 09/26/2014 1837   ALT 17 09/10/2016 1146   ALT 24 09/26/2014 1837   ALKPHOS 120 (H) 09/10/2016 1146   ALKPHOS 116 09/26/2014 1837   BILITOT 0.3 09/10/2016 1146   BILITOT 0.2 09/26/2014 1837   BILIDIR <0.1 11/18/2012 1313   IBILI NOT CALCULATED 11/18/2012 1313      Component Value Date/Time   TSH 0.344 (L) 09/10/2016 1146   TSH 0.736 10/12/2015 1010   TSH 0.639 03/08/2015 0452    ECG  shows NSR with a rate of 78 BPM INDIRECT CALORIMETER done today shows a VO2 of 175 and a REE of 1219.    ASSESSMENT AND PLAN: Other fatigue - Plan: EKG 12-Lead, Comprehensive metabolic panel, CBC with Differential/Platelet, Hemoglobin A1c, Lipid Panel With LDL/HDL Ratio, Vitamin B12, Folate, TSH, T3, T4, free, Insulin, random  Vitamin D deficiency - Plan: VITAMIN D 25 Hydroxy (Vit-D Deficiency, Fractures)  Dyspnea on exertion  Morbid obesity (HCC)  PLAN:  Fatigue Nailani was informed that her fatigue may be related to obesity, depression or many other causes. Labs will be ordered, and in the meanwhile Darshay has agreed to work on diet, exercise and weight loss to help with fatigue. Proper sleep hygiene was discussed including the need for 7-8 hours of quality sleep each night. A sleep study was not ordered based on symptoms and Epworth score.  Dyspnea on exertion Karalyn's shortness of breath appears to be  obesity related and exercise induced. She has agreed to work on weight loss and gradually increase exercise to treat her exercise induced shortness of breath. If Nelissa follows our instructions and loses weight without improvement of her shortness of breath, we will plan to refer to pulmonology. We will monitor this condition regularly. Margeart agrees to this plan.  Vitamin D deficiency Farrie has a diagnosis of vitamin D deficiency. She is currently taking vit D and denies nausea, vomiting or muscle weakness.  Diabetes risk counselling Sway was given extended (at least 15 minutes) diabetes prevention counseling today. She is 51 y.o. female and has risk factors for diabetes including obesity. We discussed intensive lifestyle modifications today with an emphasis on weight loss as well as increasing exercise and decreasing simple carbohydrates in her diet.  Depression Screen Os[place]ola had a positive depression screening. Depression is commonly associated with obesity and often results in emotional eating behaviors. We will monitor this closely and work on CBT to help improve the non-hunger eating patterns. Referral to Psychology may be required if no improvement is seen as she continues in our clinic.  Obesity Abreana is currently in the action stage of change and her goal is to continue with weight loss efforts  She has agreed to follow the Category 2 plan Brunhilda has been instructed to work up to a goal of 150 minutes of combined cardio and strengthening exercise per week for weight loss and overall health benefits.  Alizzon has agreed to follow up with our clinic in 2 weeks. She was informed of the importance of frequent follow up visits to maximize her success with intensive lifestyle modifications for her multiple health conditions. She was informed we would discuss her lab results at her next visit unless there is a critical issue that needs to be addressed sooner. Maianh agreed to keep her next  visit at the agreed upon time to discuss these results.  I, Doreene Nest, am acting as scribe for Dennard Nip, MD  I have reviewed the above documentation for accuracy and completeness, and I agree with the above. -Dennard Nip, MD

## 2016-09-11 LAB — FOLATE: Folate: >20 ng/mL (ref >3.0)

## 2016-09-11 LAB — CBC WITH DIFFERENTIAL/PLATELET
BASOS ABS: 0 10*3/uL (ref 0.0–0.2)
Basos: 0 %
EOS (ABSOLUTE): 0 10*3/uL (ref 0.0–0.4)
Eos: 1 %
HEMOGLOBIN: 14.4 g/dL (ref 11.1–15.9)
Hematocrit: 43.2 % (ref 34.0–46.6)
Immature Grans (Abs): 0 10*3/uL (ref 0.0–0.1)
Immature Granulocytes: 0 %
LYMPHS ABS: 1 10*3/uL (ref 0.7–3.1)
Lymphs: 15 %
MCH: 26.8 pg (ref 26.6–33.0)
MCHC: 33.3 g/dL (ref 31.5–35.7)
MCV: 80 fL (ref 79–97)
MONOCYTES: 5 %
Monocytes Absolute: 0.3 10*3/uL (ref 0.1–0.9)
NEUTROS PCT: 79 %
Neutrophils Absolute: 5 10*3/uL (ref 1.4–7.0)
Platelets: 220 10*3/uL (ref 150–379)
RBC: 5.38 x10E6/uL — AB (ref 3.77–5.28)
RDW: 15.7 % — ABNORMAL HIGH (ref 12.3–15.4)
WBC: 6.3 10*3/uL (ref 3.4–10.8)

## 2016-09-11 LAB — T3: T3 TOTAL: 146 ng/dL (ref 71–180)

## 2016-09-11 LAB — LIPID PANEL WITH LDL/HDL RATIO
Cholesterol, Total: 152 mg/dL (ref 100–199)
HDL: 46 mg/dL (ref >39)
LDL Calculated: 92 mg/dL (ref 0–99)
LDl/HDL Ratio: 2 ratio units (ref 0.0–3.2)
Triglycerides: 72 mg/dL (ref 0–149)
VLDL Cholesterol Cal: 14 mg/dL (ref 5–40)

## 2016-09-11 LAB — COMPREHENSIVE METABOLIC PANEL
ALK PHOS: 120 IU/L — AB (ref 39–117)
ALT: 17 IU/L (ref 0–32)
AST: 16 IU/L (ref 0–40)
Albumin/Globulin Ratio: 1.3 (ref 1.2–2.2)
Albumin: 4.2 g/dL (ref 3.5–5.5)
BILIRUBIN TOTAL: 0.3 mg/dL (ref 0.0–1.2)
BUN / CREAT RATIO: 14 (ref 9–23)
BUN: 12 mg/dL (ref 6–24)
CHLORIDE: 105 mmol/L (ref 96–106)
CO2: 19 mmol/L (ref 18–29)
Calcium: 9 mg/dL (ref 8.7–10.2)
Creatinine, Ser: 0.83 mg/dL (ref 0.57–1.00)
GFR calc Af Amer: 96 mL/min/{1.73_m2} (ref >59)
GFR calc non Af Amer: 83 mL/min/{1.73_m2} (ref >59)
GLUCOSE: 106 mg/dL — AB (ref 65–99)
Globulin, Total: 3.3 g/dL (ref 1.5–4.5)
Potassium: 4.2 mmol/L (ref 3.5–5.2)
Sodium: 140 mmol/L (ref 134–144)
Total Protein: 7.5 g/dL (ref 6.0–8.5)

## 2016-09-11 LAB — T4, FREE: Free T4: 1.01 ng/dL (ref 0.82–1.77)

## 2016-09-11 LAB — HEMOGLOBIN A1C
ESTIMATED AVERAGE GLUCOSE: 123 mg/dL
HEMOGLOBIN A1C: 5.9 % — AB (ref 4.8–5.6)

## 2016-09-11 LAB — VITAMIN D 25 HYDROXY (VIT D DEFICIENCY, FRACTURES): VIT D 25 HYDROXY: 25 ng/mL — AB (ref 30.0–100.0)

## 2016-09-11 LAB — INSULIN, RANDOM: INSULIN: 36.3 u[IU]/mL — ABNORMAL HIGH (ref 2.6–24.9)

## 2016-09-11 LAB — TSH: TSH: 0.344 u[IU]/mL — AB (ref 0.450–4.500)

## 2016-09-11 LAB — VITAMIN B12: VITAMIN B 12: 491 pg/mL (ref 232–1245)

## 2016-09-17 ENCOUNTER — Ambulatory Visit (INDEPENDENT_AMBULATORY_CARE_PROVIDER_SITE_OTHER): Payer: Self-pay | Admitting: Family Medicine

## 2016-09-20 IMAGING — CT CT HEAD W/O CM
2 series · 15 of 30 positions shown, 19 images · non-contrast
Comparison: Brain MRI, 03/29/2014.  Head CT, 08/07/2012.

CLINICAL DATA: "Pt states that this morning around 073am when pt
got up to use the bathroom noticed that her right side of her body
was numb and weak like. Pt states that she just rubbed her leg
thinking it would help. Pt states that she had an epidural injection
last week and not for sure if could be from that. Pt states that her
right foot feels heavy and her right grip is slightly weaker than
her left grip. Pt states that around 830 this mornign she started
having center chest pain that is pressure like. Pt states nausea but
denies vomiting. "

Left side numbness.   Left arm feels weak
EXAM:
CT HEAD WITHOUT CONTRAST
TECHNIQUE: Contiguous axial images were obtained from the base of the skull
through the vertex without intravenous contrast.

[Series 2: head w/o · axial · non-contrast · 0.45mm/px · z∈[+1369,+1484]mm · 13 of 27 slices shown, 17 images]
[im 2/27  brain]
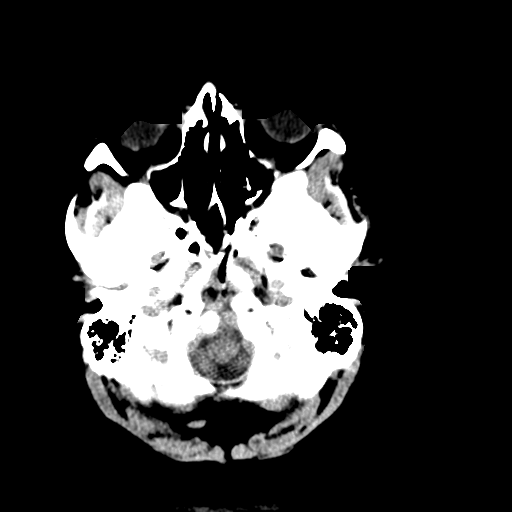
[im 2/27  bone]
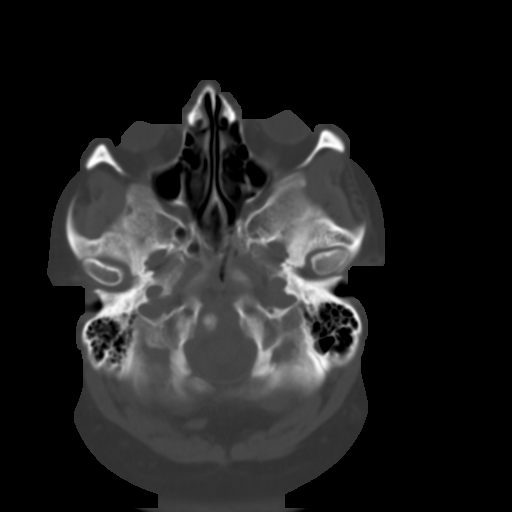
[im 4/27  brain]
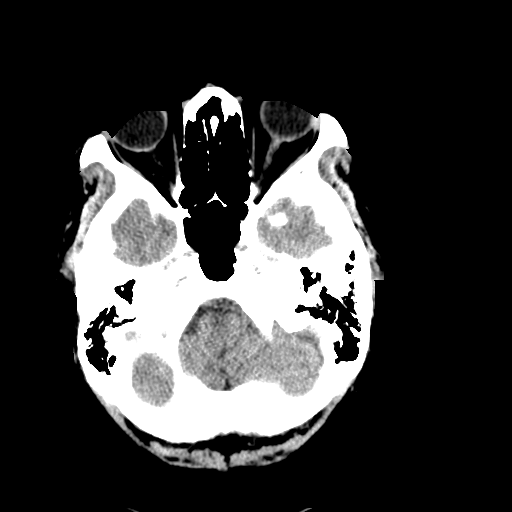
[im 6/27  brain]
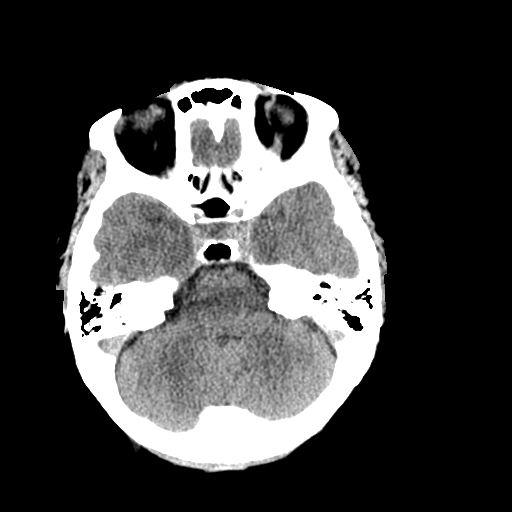
[im 8/27  brain]
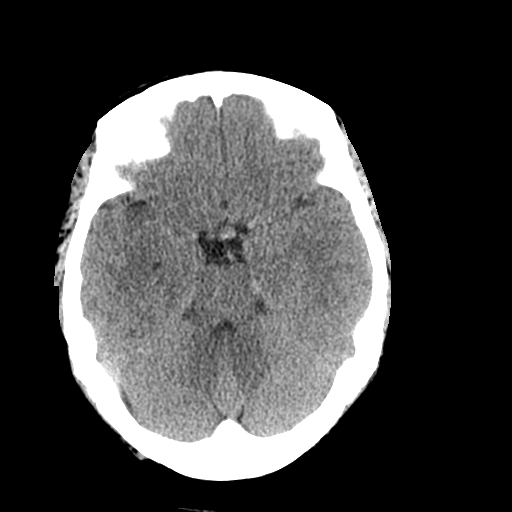
[im 10/27  brain]
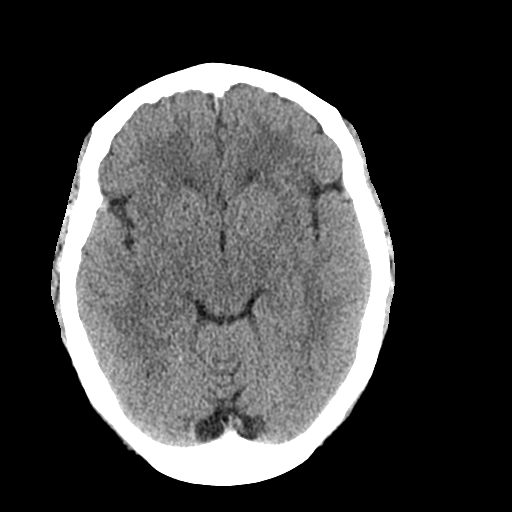
[im 10/27  bone]
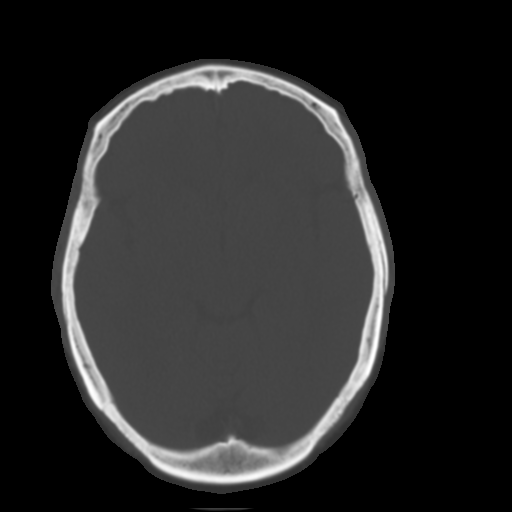
[im 12/27  brain]
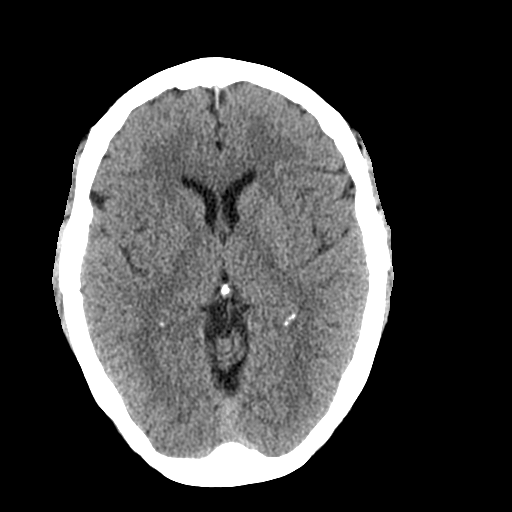
[im 14/27  brain]
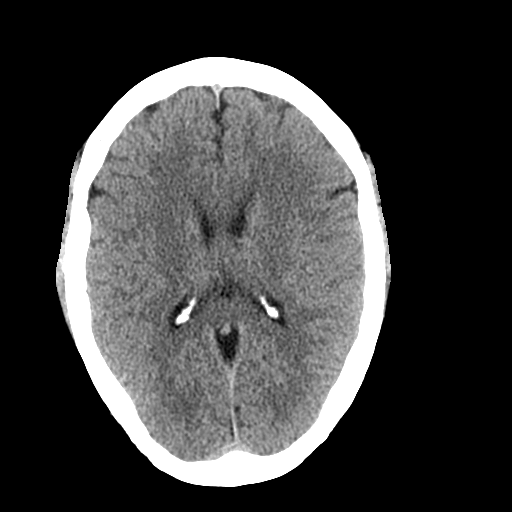
[im 15/27  brain]
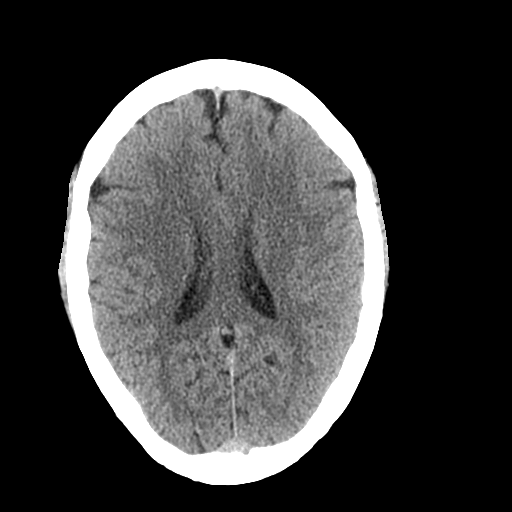
[im 17/27  brain]
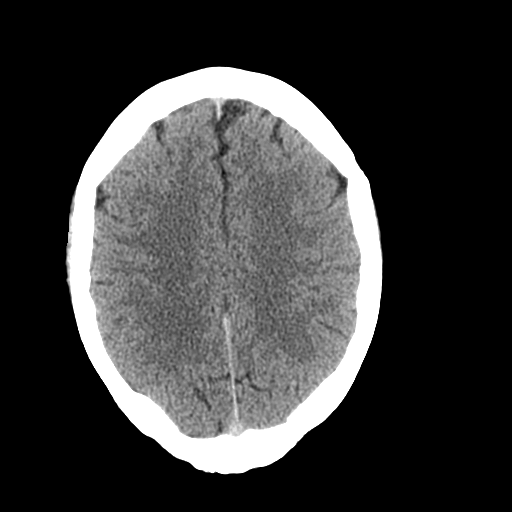
[im 17/27  bone]
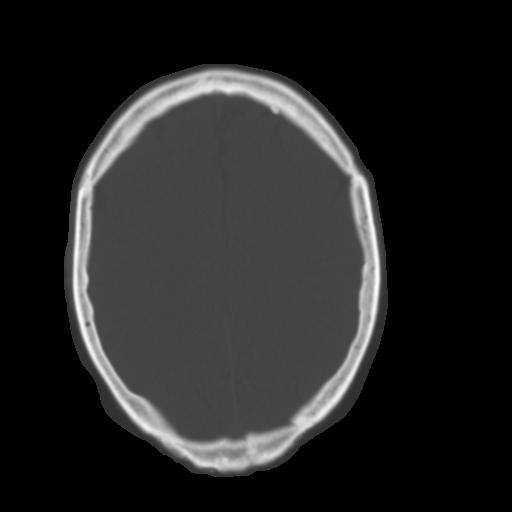
[im 19/27  brain]
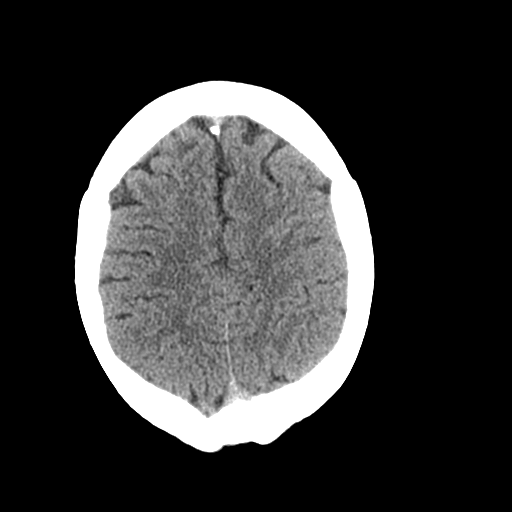
[im 21/27  brain]
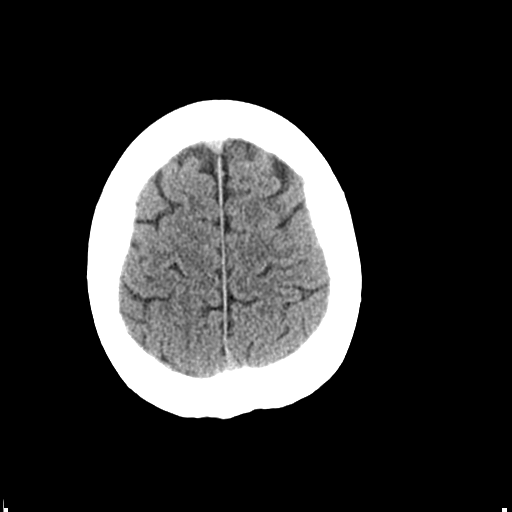
[im 23/27  brain]
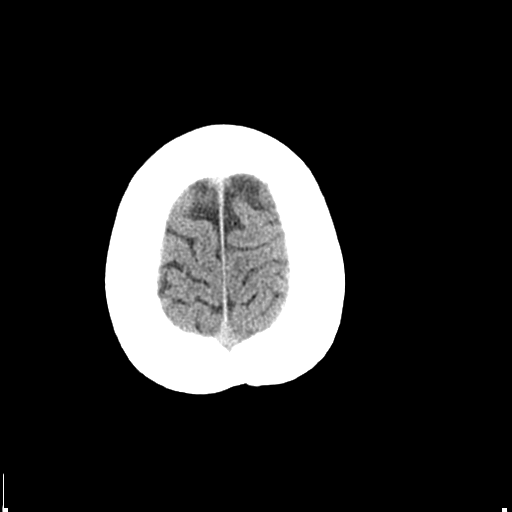
[im 25/27  brain]
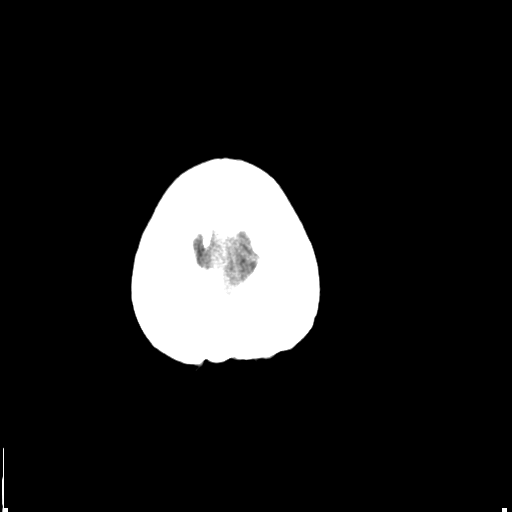
[im 25/27  bone]
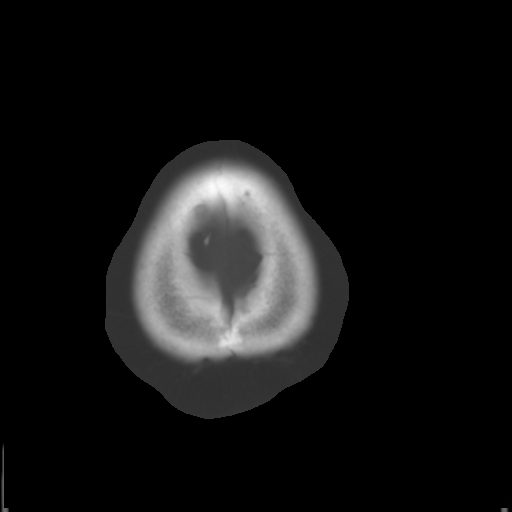

[Series 3: bone windows · axial · 0.45mm/px · z∈[+1369,+1389]mm · 2 of 27 slices shown]
[im 2/27  bone]
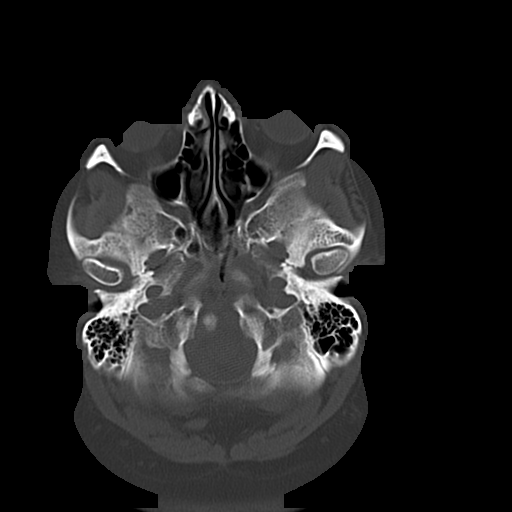
[im 6/27  bone]
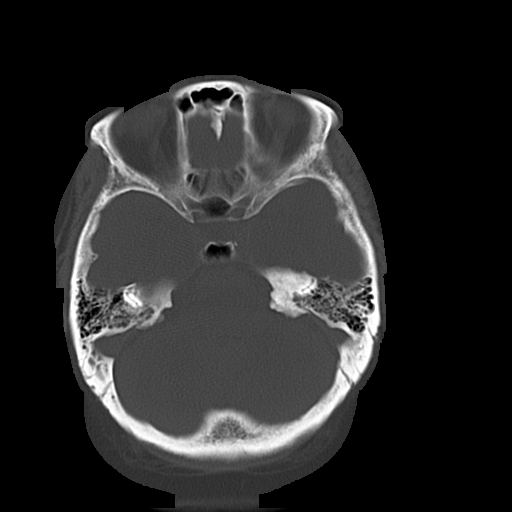

[15 of 30 positions shown; findings below may reference images not displayed]

FINDINGS: The ventricles are normal in size and configuration. There are no
parenchymal masses or mass effect. There is no evidence of an
infarct. There are no extra-axial masses or abnormal fluid
collections.

There is no intracranial hemorrhage.

Visualized sinuses and mastoid air cells are clear.  No skull
IMPRESSION: Normal unenhanced CT scan of the brain.

## 2016-09-24 ENCOUNTER — Ambulatory Visit (INDEPENDENT_AMBULATORY_CARE_PROVIDER_SITE_OTHER): Payer: BC Managed Care – PPO | Admitting: Family Medicine

## 2016-09-24 VITALS — BP 134/83 | HR 66 | Temp 98.8°F | Ht 63.0 in | Wt 278.0 lb

## 2016-09-24 DIAGNOSIS — R7303 Prediabetes: Secondary | ICD-10-CM

## 2016-09-24 DIAGNOSIS — E559 Vitamin D deficiency, unspecified: Secondary | ICD-10-CM

## 2016-09-24 MED ORDER — METFORMIN HCL 500 MG PO TABS: 500.0000 mg | ORAL_TABLET | Freq: Every day | ORAL | 0 refills | Status: DC

## 2016-09-25 NOTE — Progress Notes (Signed)
Office: [phone]  /  Fax: [phone]   HPI:   Chief Complaint: OBESITY [name] [name] is here to discuss her progress with her obesity treatment plan. She is following her eating plan approximately 0 % of the time and states she is exercising 0 minutes 0 times per week. [name] [name] is currently struggling with following the plan. She has been unable to follow the plan in the past 2 weeks and has gained 2 pounds. She has had a lot of stresses with health and weather issues and hasn't concentrated on weight loss. [name] [name] is not ready to make changes at this point. She has agreed to work on Doctor, hospital with the goal of maintaining her weight for the next 2 weeks. Her weight is 278 lb (126.1 kg) today and has had a weight gain of 2 lbs over a period of 2 weeks since her last visit. She has gained 2 lbs since starting treatment with [name].  Pre-Diabetes [name] [name] has a diagnosis of prediabetes based on her elevated HgA1c, fasting glucose and fasting insulin and was informed this puts her at greater risk of developing diabetes. She is not taking metformin currently and continues to work on diet and exercise to decrease risk of diabetes. She  Admits polyphagia and denies nausea or hypoglycemia.  Vitamin D deficiency [name] [name] has a diagnosis of vitamin D deficiency. She is currently taking vit D but not yet at goal, admits fatigue and denies nausea, vomiting or muscle weakness.   Wt Readings from Last 500 Encounters:  09/24/16 278 lb (126.1 kg)  09/10/16 276 lb (125.2 kg)  07/23/16 283 lb (128.4 kg)  06/28/16 281 lb (127.5 kg)  05/14/16 268 lb (121.6 kg)  02/24/16 268 lb (121.6 kg)  02/17/16 273 lb (123.8 kg)  01/18/16 278 lb (126.1 kg)  01/04/16 274 lb 6.4 oz (124.5 kg)  11/30/15 262 lb (118.8 kg)  10/06/15 270 lb 8 oz (122.7 kg)  09/08/15 264 lb 11.2 oz (120.1 kg)  09/07/15 260 lb (117.9 kg)  07/20/15 262 lb (118.8 kg)  07/19/15 270 lb 6.4 oz (122.7 kg)  05/23/15 262 lb (118.8  kg)  04/27/15 270 lb (122.5 kg)  04/25/15 268 lb 8 oz (121.8 kg)  04/15/15 268 lb (121.6 kg)  04/06/15 259 lb (117.5 kg)  03/11/15 278 lb (126.1 kg)  03/02/15 275 lb (124.7 kg)  02/02/15 281 lb (127.5 kg)  01/21/15 283 lb (128.4 kg)  09/05/14 275 lb (124.7 kg)  08/17/14 278 lb 3.2 oz (126.2 kg)  06/09/14 282 lb (127.9 kg)  03/09/14 275 lb (124.7 kg)  02/03/14 263 lb (119.3 kg)  06/22/13 270 lb (122.5 kg)  06/09/13 274 lb (124.3 kg)  05/13/13 275 lb (124.7 kg)  01/30/13 265 lb (120.2 kg)  01/02/13 264 lb (119.7 kg)  12/17/12 272 lb 0.8 oz (123.4 kg)  12/10/12 272 lb 0.8 oz (123.4 kg)  11/25/12 268 lb (121.6 kg)  11/20/12 267 lb (121.1 kg)  11/18/12 273 lb 4 oz (123.9 kg)  11/06/12 274 lb (124.3 kg)  08/07/12 268 lb (121.6 kg)  05/15/12 262 lb (118.8 kg)  07/21/11 261 lb (118.4 kg)  04/21/11 268 lb (121.6 kg)     ALLERGIES: Allergies  Allergen Reactions   Amoxicillin Hives and Swelling   Biaxin [Clarithromycin] Hives   Erythromycin Base Hives   Penicillins Hives and Swelling   Zithromax [Azithromycin] Hives and Nausea Only    MEDICATIONS: Current Outpatient Prescriptions on File Prior to Visit  Medication Sig  Dispense Refill   albuterol (PROVENTIL HFA;VENTOLIN HFA) 108 (90 BASE) MCG/ACT inhaler Inhale 2 puffs into the lungs every 6 (six) hours as needed for wheezing.     clonazePAM (KLONOPIN) 1 MG tablet Take 1 mg by mouth at bedtime as needed (for sleep and if she feels a seizure coming on).     dihydroergotamine (MIGRANAL) 4 MG/ML nasal spray Place 1 spray into the nose as needed for migraine. Use in one nostril as directed.  No more than 4 sprays in one hour 8 mL 12   lamoTRIgine (LAMICTAL) 100 MG tablet Take 1 tablet (100 mg total) by mouth 2 (two) times daily. 180 tablet 0   metoprolol tartrate (LOPRESSOR) 25 MG tablet Take 1 tablet (25 mg total) by mouth 2 (two) times daily. (Patient taking differently: Take 25 mg by mouth daily. ) 60 tablet 0    promethazine (PHENERGAN) 50 MG tablet Take 0.5 tablets (25 mg total) by mouth every 6 (six) hours as needed for nausea or vomiting. 30 tablet 0   topiramate (TOPAMAX) 100 MG tablet Take 1 tablet (100 mg total) by mouth 2 (two) times daily. 60 tablet 11   Vitamin D, Ergocalciferol, (DRISDOL) 50000 units CAPS capsule TAKE 1 CAPSULE BY MOUTH ONCE A WEEK.  5   Current Facility-Administered Medications on File Prior to Visit  Medication Dose Route Frequency Provider Last Rate Last Dose   fentaNYL (SUBLIMAZE) injection 50 mcg  50 mcg Intravenous UD Lance Bosch, MD       iopamidol (ISOVUE-M) 41 % intrathecal injection 20 mL  20 mL Epidural PRN Lance Bosch, MD       midazolam (VERSED) 5 MG/5ML injection 1 mg  1 mg Intravenous UD Lance Bosch, MD       triamcinolone acetonide (KENALOG-40) injection 80 mg  80 mg Intramuscular Once Lance Bosch, MD       valproate (DEPACON) 500 mg in dextrose 5 % 50 mL IVPB  500 mg Intravenous Once Larey Seat, MD        PAST MEDICAL HISTORY: Past Medical History:  Diagnosis Date   Anginal pain ([place])    Anxiety    Asthma    Atrial fibrillation ([facility])    Back pain    Back pain    bulging disk   Chest pain    Epilepsy ([facility])    Fibroid    GERD (gastroesophageal reflux disease)    Head injury    Joint pain    Migraine    Obesity, Class III, BMI 40-49.9 (morbid obesity) ([place]) 12/21/2012   Ovarian cyst    Prediabetes    Seizures ([facility])    Shortness of breath dyspnea    Surgical menopause    TBI (traumatic brain injury) ([place])    TIA (transient ischemic attack)    Vitamin D deficiency     PAST SURGICAL HISTORY: Past Surgical History:  Procedure Laterality Date   ABDOMINAL HYSTERECTOMY     wiht bso   CARDIAC CATHETERIZATION N/A 01/21/2015   Procedure: Left Heart Cath;  Surgeon: Yolonda Kida, MD;  Location: [facility];  Service: Cardiovascular;  Laterality: N/A;   CESAREAN SECTION     COLONOSCOPY  N/A 11/21/2012   Procedure: COLONOSCOPY;  Surgeon: Irene Shipper, MD;  Location[facility];  Service: Endoscopy;  Laterality: N/A;   DILATION AND CURETTAGE OF UTERUS     ESOPHAGOGASTRODUODENOSCOPY N/A 11/21/2012   Procedure: ESOPHAGOGASTRODUODENOSCOPY (EGD);  Surgeon: Irene Shipper, MD;  [facility];  Service: Endoscopy;  Laterality: N/A;   HERNIA REPAIR  12/17/12   laparoscopic incisional    INCISIONAL HERNIA REPAIR N/A 12/17/2012   Procedure: LAPAROSCOPIC INCISIONAL HERNIA;  Surgeon: Harl Bowie, MD;  Location: [facility];  Service: General;  Laterality: N/A;   INSERTION OF MESH N/A 12/17/2012   Procedure: INSERTION OF MESH;  Surgeon: Harl Bowie, MD;  Location: [facility];  Service: General;  Laterality: N/A;    SOCIAL HISTORY: Social History  Substance Use Topics   Smoking status: Never Smoker   Smokeless tobacco: Never Used   Alcohol use 0.0 oz/week    FAMILY HISTORY: Family History  Problem Relation Age of Onset   Hypertension Mother    Depression Mother    Heart disease Mother     deceased 01/25/2014   Heart attack Father    Hypertension Father    Emphysema Father    COPD Father    Heart disease Father    Diabetes Father    Breast cancer Cousin    Cancer Neg Hx    Colon cancer Neg Hx    Esophageal cancer Neg Hx    Stomach cancer Neg Hx    Pancreatic cancer Neg Hx    Liver disease Neg Hx     ROS: Review of Systems  Constitutional: Positive for malaise/fatigue. Negative for weight loss.  Gastrointestinal: Negative for nausea and vomiting.  Musculoskeletal:       Negative muscle weakness  Endo/Heme/Allergies:       Negative hypoglycemia Positive polyphagia     PHYSICAL EXAM: Blood pressure 134/83, pulse 66, temperature 98.8 F (37.1 C), temperature source Oral, height 5\' 3"  (1.6 m), weight 278 lb (126.1 kg), SpO2 98 %. Body mass index is 49.25 kg/m. Physical Exam  Constitutional: She is oriented to person, place, and  time. She appears well-developed and well-nourished.  Cardiovascular: Normal rate.   Pulmonary/Chest: Effort normal.  Musculoskeletal: Normal range of motion.  Neurological: She is oriented to person, place, and time.  Skin: Skin is warm and dry.  Psychiatric: She has a normal mood and affect. Her behavior is normal.  Vitals reviewed.   RECENT LABS AND TESTS: BMET    Component Value Date/Time   NA 140 09/10/2016 1146   NA 140 12/01/2014 1109   K 4.2 09/10/2016 1146   K 3.8 12/01/2014 1109   CL 105 09/10/2016 1146   CL 111 12/01/2014 1109   CO2 19 09/10/2016 1146   CO2 23 12/01/2014 1109   GLUCOSE 106 (H) 09/10/2016 1146   GLUCOSE 117 (H) 06/28/2016 2152   GLUCOSE 99 12/01/2014 1109   BUN 12 09/10/2016 1146   BUN 17 12/01/2014 1109   CREATININE 0.83 09/10/2016 1146   CREATININE 0.83 12/01/2014 1109   CREATININE 0.82 06/22/2013 1152   CALCIUM 9.0 09/10/2016 1146   CALCIUM 8.7 (L) 12/01/2014 1109   GFRNONAA 83 09/10/2016 1146   GFRNONAA >60 12/01/2014 1109   GFRAA 96 09/10/2016 1146   GFRAA >60 12/01/2014 1109   Lab Results  Component Value Date   HGBA1C 5.9 (H) 09/10/2016   HGBA1C 5.6 10/12/2015   HGBA1C 5.9 (H) 04/23/2015   HGBA1C 5.9 (H) 03/08/2015   HGBA1C 5.9 09/27/2014   Lab Results  Component Value Date   INSULIN 36.3 (H) 09/10/2016   CBC    Component Value Date/Time   WBC 6.3 09/10/2016 1146   WBC 10.1 06/28/2016 2152   RBC 5.38 (H) 09/10/2016 1146   RBC 4.72 06/28/2016 2152  HGB 12.8 06/28/2016 2152   HGB 13.0 12/01/2014 1109   HCT 43.2 09/10/2016 1146   PLT 220 09/10/2016 1146   MCV 80 09/10/2016 1146   MCV 83 12/01/2014 1109   MCH 26.8 09/10/2016 1146   MCH 27.1 06/28/2016 2152   MCHC 33.3 09/10/2016 1146   MCHC 32.8 06/28/2016 2152   RDW 15.7 (H) 09/10/2016 1146   RDW 15.0 (H) 12/01/2014 1109   LYMPHSABS 1.0 09/10/2016 1146   MONOABS 0.5 02/05/2016 1409   EOSABS 0.0 09/10/2016 1146   BASOSABS 0.0 09/10/2016 1146   Iron/TIBC/Ferritin/  %Sat No results found for: IRON, TIBC, FERRITIN, IRONPCTSAT Lipid Panel     Component Value Date/Time   CHOL 152 09/10/2016 1146   CHOL 141 09/27/2014 0601   TRIG 72 09/10/2016 1146   TRIG 64 09/27/2014 0601   HDL 46 09/10/2016 1146   HDL 44 09/27/2014 0601   CHOLHDL 3.4 10/12/2015 1010   CHOLHDL 3.4 04/23/2015 0414   VLDL 11 04/23/2015 0414   VLDL 13 09/27/2014 0601   LDLCALC 92 09/10/2016 1146   LDLCALC 84 09/27/2014 0601   Hepatic Function Panel     Component Value Date/Time   PROT 7.5 09/10/2016 1146   PROT 7.7 09/26/2014 1837   ALBUMIN 4.2 09/10/2016 1146   ALBUMIN 3.5 09/26/2014 1837   AST 16 09/10/2016 1146   AST 17 09/26/2014 1837   ALT 17 09/10/2016 1146   ALT 24 09/26/2014 1837   ALKPHOS 120 (H) 09/10/2016 1146   ALKPHOS 116 09/26/2014 1837   BILITOT 0.3 09/10/2016 1146   BILITOT 0.2 09/26/2014 1837   BILIDIR <0.1 11/18/2012 1313   IBILI NOT CALCULATED 11/18/2012 1313      Component Value Date/Time   TSH 0.344 (L) 09/10/2016 1146   TSH 0.736 10/12/2015 1010   TSH 0.639 03/08/2015 0452    ASSESSMENT AND PLAN: Prediabetes - Plan: metFORMIN (GLUCOPHAGE) 500 MG tablet  Vitamin D deficiency  Morbid obesity ([place])  PLAN:  Pre-Diabetes Kaylanie will continue to work on weight loss, exercise, and decreasing simple carbohydrates in her diet to help decrease the risk of diabetes. We dicussed metformin including benefits and risks. She was informed that eating too many simple carbohydrates or too many calories at one sitting increases the likelihood of GI side effects. Kylen has agreed to start to take metformin 500 mg every morning #30 with no refills. Anniebell agreed to follow up with [name] as directed to monitor her progress.  Vitamin D Deficiency Tkeya was informed that low vitamin D levels contributes to fatigue and are associated with obesity, breast, and colon cancer. She agrees to continue to take prescription Vit D @50 ,000 IU every week and will follow up for  routine testing of vitamin D, at least 2-3 times per year. She was informed of the risk of over-replacement of vitamin D and agrees to not increase her dose unless he discusses this with [name] first.  Obesity   Leoria is not currently in the action stage of change. As such, her goal is to maintain weight for now She has agreed to portion control better and make smarter food choices, such as increase vegetables and decrease simple carbohydrates  Robena has been instructed to work up to a goal of 150 minutes of combined cardio and strengthening exercise per week for weight loss and overall health benefits. We discussed the following Behavioral Modification Stratagies today: increasing lean protein intake, decreasing simple carbohydrates , increasing vegetables, increasing lower sugar fruits and decrease junk  food  Znya has agreed to follow up with our clinic in 2 weeks. She was informed of the importance of frequent follow up visits to maximize her success with intensive lifestyle modifications for her multiple health conditions.  I, Doreene Nest, am acting as scribe for Dennard Nip, MD  I have reviewed the above documentation for accuracy and completeness, and I agree with the above. -Dennard Nip, MD

## 2016-10-08 ENCOUNTER — Other Ambulatory Visit: Payer: Self-pay | Admitting: Family Medicine

## 2016-10-08 ENCOUNTER — Other Ambulatory Visit: Payer: Self-pay | Admitting: Obstetrics and Gynecology

## 2016-10-08 DIAGNOSIS — Z1231 Encounter for screening mammogram for malignant neoplasm of breast: Secondary | ICD-10-CM

## 2016-10-08 NOTE — Progress Notes (Deleted)
**Note De-Identified [name] Obfuscation** Patient ID: [name] [name], female   DOB: [birth year], 51 y.o.   MRN: [id number] ANNUAL PREVENTATIVE CARE GYN  ENCOUNTER NOTE  Subjective:       [name] [name] is a 51 y.o. No obstetric history on file. female here for a routine annual gynecologic exam.  Current complaints: 1.    Gynecologic History No LMP recorded (lmp unknown). Patient has had a hysterectomy. Contraception: status post hysterectomy Last Pap: no pap needed. Results were: normal Last mammogram: 09/19/2015 birad 1 Results were: normal  Obstetric History OB History  Gravida Para Term Preterm AB Living  2 2 2         SAB TAB Ectopic Multiple Live Births          2    # Outcome Date GA Lbr Len/2nd Weight Sex Delivery Anes PTL Lv  2 Term           1 Term               Past Medical History:  Diagnosis Date  . Anginal pain ([place])   . Anxiety   . Asthma   . Atrial fibrillation ([place])   . Back pain   . Back pain    bulging disk  . Chest pain   . Epilepsy ([place])   . Fibroid   . GERD (gastroesophageal reflux disease)   . Head injury   . Joint pain   . Migraine   . Obesity, Class III, BMI 40-49.9 (morbid obesity) ([place]) 12/21/2012  . Ovarian cyst   . Prediabetes   . Seizures ([place])   . Shortness of breath dyspnea   . Surgical menopause   . TBI (traumatic brain injury) ([place])   . TIA (transient ischemic attack)   . Vitamin D deficiency     Past Surgical History:  Procedure Laterality Date  . ABDOMINAL HYSTERECTOMY     wiht bso  . CARDIAC CATHETERIZATION N/A 01/21/2015   Procedure: Left Heart Cath;  Surgeon: Yolonda Kida, MD;  Location: [facility];  Service: Cardiovascular;  Laterality: N/A;  . CESAREAN SECTION    . COLONOSCOPY N/A 11/21/2012   Procedure: COLONOSCOPY;  Surgeon: Irene Shipper, MD;  [facility];  Service: Endoscopy;  Laterality: N/A;  . DILATION AND CURETTAGE OF UTERUS    . ESOPHAGOGASTRODUODENOSCOPY N/A 11/21/2012   Procedure: ESOPHAGOGASTRODUODENOSCOPY (EGD);  Surgeon:  Irene Shipper, MD;  Location: [facility];  Service: Endoscopy;  Laterality: N/A;  . HERNIA REPAIR  12/17/12   laparoscopic incisional   . INCISIONAL HERNIA REPAIR N/A 12/17/2012   Procedure: LAPAROSCOPIC INCISIONAL HERNIA;  Surgeon: Harl Bowie, MD;  Location: [facility];  Service: General;  Laterality: N/A;  . INSERTION OF MESH N/A 12/17/2012   Procedure: INSERTION OF MESH;  Surgeon: Harl Bowie, MD;  Location: [facility];  Service: General;  Laterality: N/A;    Current Outpatient Prescriptions on File Prior to Visit  Medication Sig Dispense Refill  . albuterol (PROVENTIL HFA;VENTOLIN HFA) 108 (90 BASE) MCG/ACT inhaler Inhale 2 puffs into the lungs every 6 (six) hours as needed for wheezing.    . clonazePAM (KLONOPIN) 1 MG tablet Take 1 mg by mouth at bedtime as needed (for sleep and if she feels a seizure coming on).    . dihydroergotamine (MIGRANAL) 4 MG/ML nasal spray Place 1 spray into the nose as needed for migraine. Use in one nostril as directed.  No more than 4 sprays in one hour 8 mL 12  .  lamoTRIgine (LAMICTAL) 100 MG tablet Take 1 tablet (100 mg total) by mouth 2 (two) times daily. 180 tablet 0  . metFORMIN (GLUCOPHAGE) 500 MG tablet Take 1 tablet (500 mg total) by mouth daily with breakfast. 30 tablet 0  . metoprolol tartrate (LOPRESSOR) 25 MG tablet Take 1 tablet (25 mg total) by mouth 2 (two) times daily. (Patient taking differently: Take 25 mg by mouth daily. ) 60 tablet 0  . promethazine (PHENERGAN) 50 MG tablet Take 0.5 tablets (25 mg total) by mouth every 6 (six) hours as needed for nausea or vomiting. 30 tablet 0  . topiramate (TOPAMAX) 100 MG tablet Take 1 tablet (100 mg total) by mouth 2 (two) times daily. 60 tablet 11  . Vitamin D, Ergocalciferol, (DRISDOL) 50000 units CAPS capsule TAKE 1 CAPSULE BY MOUTH ONCE A WEEK.  5   Current Facility-Administered Medications on File Prior to Visit  Medication Dose Route Frequency Provider Last Rate Last Dose  . fentaNYL  (SUBLIMAZE) injection 50 mcg  50 mcg Intravenous UD Lance Bosch, MD      . iopamidol (ISOVUE-M) 41 % intrathecal injection 20 mL  20 mL Epidural PRN Lance Bosch, MD      . midazolam (VERSED) 5 MG/5ML injection 1 mg  1 mg Intravenous UD Lance Bosch, MD      . triamcinolone acetonide [facility]) injection 80 mg  80 mg Intramuscular Once Lance Bosch, MD      . valproate (DEPACON) 500 mg in dextrose 5 % 50 mL IVPB  500 mg Intravenous Once Larey Seat, MD        Allergies  Allergen Reactions  . Amoxicillin Hives and Swelling  . Biaxin [Clarithromycin] Hives  . Erythromycin Base Hives  . Penicillins Hives and Swelling  . Zithromax [Azithromycin] Hives and Nausea Only    Social History   Social History  . Marital status: Divorced    Spouse name: N/A  . Number of children: 2  . Years of education: 12+   Occupational History  . 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 OFFICE ASSISTANT  [employer]   Social History Main Topics  . Smoking status: Never Smoker  . Smokeless tobacco: Never Used  . Alcohol use 0.0 oz/week  . Drug use: No  . Sexual activity: Yes    Birth control/ protection: Surgical   Other Topics Concern  . Not on file   Social History Narrative   Patient is single with 2 children   Patient is right handed   Patient has some college   Patient does not drink caffeine          Family History  Problem Relation Age of Onset  . Hypertension Mother   . Depression Mother   . Heart disease Mother     deceased 2014/02/19  . Heart attack Father   . Hypertension Father   . Emphysema Father   . COPD Father   . Heart disease Father   . Diabetes Father   . Breast cancer Cousin   . Cancer Neg Hx   . Colon cancer Neg Hx   . Esophageal cancer Neg Hx   . Stomach cancer Neg Hx   . Pancreatic cancer Neg Hx   . Liver disease Neg Hx     The following portions of the patient's history were reviewed and updated as  appropriate: allergies, current medications, past family history, past medical history, past social history, past surgical history and problem list.  Review of Systems ROS  Review of Systems - General ROS: negative for - chills, fatigue, fever, hot flashes, night sweats, weight gain or weight loss Psychological ROS: negative for - anxiety, decreased libido, depression, mood swings, physical abuse or sexual abuse Ophthalmic ROS: negative for - blurry vision, eye pain or loss of vision ENT ROS: negative for - headaches, hearing change, visual changes or vocal changes Allergy and Immunology ROS: negative for - hives, itchy/watery eyes or seasonal allergies Hematological and Lymphatic ROS: negative for - bleeding problems, bruising, swollen lymph nodes or weight loss Endocrine ROS: negative for - galactorrhea, hair pattern changes, hot flashes, malaise/lethargy, mood swings, palpitations, polydipsia/polyuria, skin changes, temperature intolerance or unexpected weight changes Breast ROS: negative for - new or changing breast lumps or nipple discharge Respiratory ROS: negative for - cough or shortness of breath Cardiovascular ROS: negative for - chest pain, irregular heartbeat, palpitations or shortness of breath Gastrointestinal ROS: no abdominal pain, change in bowel habits, or black or bloody stools Genito-Urinary ROS: no dysuria, trouble voiding, or hematuria.  POSITIVE-vaginal itching Musculoskeletal ROS: negative for - joint pain or joint stiffness Neurological ROS: negative for - bowel and bladder control changes Dermatological ROS: negative for rash and skin lesion changes   Objective:   LMP  (LMP Unknown) Comment: 2005 CONSTITUTIONAL: Well-developed, well-nourished female in no acute distress.  PSYCHIATRIC: Normal mood and affect. Normal behavior. Normal judgment and thought content. [place]: Alert and oriented to person, place, and time. Normal muscle tone coordination. No cranial nerve  deficit noted. HENT:  Normocephalic, atraumatic, External right and left ear normal. Oropharynx is clear and moist EYES: Conjunctivae and EOM are normal. Pupils are equal, round, and reactive to light. No scleral icterus.  NECK: Normal range of motion, supple, no masses.  Normal thyroid.  SKIN: Skin is warm and dry. No rash noted. Not diaphoretic. No erythema. No pallor. CARDIOVASCULAR: Normal heart rate noted, regular rhythm, no murmur. RESPIRATORY: Clear to auscultation bilaterally. Effort and breath sounds normal, no problems with respiration noted. BREASTS: Symmetric in size. No masses, skin changes, nipple drainage, or lymphadenopathy. ABDOMEN: Soft, normal bowel sounds, no distention noted.  No tenderness, rebound or guarding.  BLADDER: Normal PELVIC:  External Genitalia: Normal  BUS: Normal  Vagina: Normal  Cervix: surgically absent  Uterus: surgically absent  Adnexa: Normal  RV: External Exam NormaI, No Rectal Masses and Normal Sphincter tone  MUSCULOSKELETAL: Normal range of motion. No tenderness.  No cyanosis, clubbing, or edema.  2+ distal pulses. LYMPHATIC: No Axillary, Supraclavicular, or Inguinal Adenopathy.    Assessment:   Annual gynecologic examination 51 y.o. Contraception: status post hysterectomy TAH/BSO [id number] Surgical menopause  Plan:  Pap: No further paps needed Mammogram: ordered Stool Guaiac Testing:  Not Indicated Labs: vit d tsh fbs a1c lipid-  Routine preventative health maintenance measures emphasized: Exercise/Diet/Weight control, Tobacco Warnings and Alcohol/Substance use risks Estring 2 mg every 3 months intravaginal Return to [place]   Note: This dictation was prepared with Dragon dictation along with smaller phrase technology. Any transcriptional errors that result from this process are unintentional.

## 2016-10-09 ENCOUNTER — Ambulatory Visit (INDEPENDENT_AMBULATORY_CARE_PROVIDER_SITE_OTHER): Payer: BC Managed Care – PPO | Admitting: Obstetrics and Gynecology

## 2016-10-09 ENCOUNTER — Encounter: Payer: BC Managed Care – PPO | Admitting: Obstetrics and Gynecology

## 2016-10-09 ENCOUNTER — Encounter: Payer: Self-pay | Admitting: Obstetrics and Gynecology

## 2016-10-09 ENCOUNTER — Telehealth: Payer: Self-pay | Admitting: Neurology

## 2016-10-09 VITALS — BP 123/73 | HR 69 | Ht 63.0 in | Wt 280.6 lb

## 2016-10-09 DIAGNOSIS — Z79890 Hormone replacement therapy (postmenopausal): Secondary | ICD-10-CM

## 2016-10-09 DIAGNOSIS — E8940 Asymptomatic postprocedural ovarian failure: Secondary | ICD-10-CM | POA: Diagnosis not present

## 2016-10-09 DIAGNOSIS — Z90710 Acquired absence of both cervix and uterus: Secondary | ICD-10-CM

## 2016-10-09 DIAGNOSIS — Z9079 Acquired absence of other genital organ(s): Secondary | ICD-10-CM

## 2016-10-09 DIAGNOSIS — R42 Dizziness and giddiness: Secondary | ICD-10-CM

## 2016-10-09 DIAGNOSIS — Z01419 Encounter for gynecological examination (general) (routine) without abnormal findings: Secondary | ICD-10-CM | POA: Diagnosis not present

## 2016-10-09 DIAGNOSIS — Z202 Contact with and (suspected) exposure to infections with a predominantly sexual mode of transmission: Secondary | ICD-10-CM

## 2016-10-09 DIAGNOSIS — E66813 Obesity, class 3: Secondary | ICD-10-CM

## 2016-10-09 DIAGNOSIS — Z90722 Acquired absence of ovaries, bilateral: Secondary | ICD-10-CM | POA: Diagnosis not present

## 2016-10-09 DIAGNOSIS — Z87828 Personal history of other (healed) physical injury and trauma: Secondary | ICD-10-CM

## 2016-10-09 MED ORDER — ESTRADIOL 2 MG VA RING: 2.0000 mg | VAGINAL_RING | VAGINAL | 3 refills | Status: DC

## 2016-10-09 NOTE — Telephone Encounter (Signed)
I spoke to patient. She has an appt with PCP tomorrow to address this. After she sees PCP she will call back if she and PCP feel that she needs to come in sooner.  She has appt with [name] [name] in March, this can be moved up if needed.

## 2016-10-09 NOTE — Patient Instructions (Addendum)
1. No Pap smear needed 2. Mammogram ordered 3. Refill Estring 2 mg intravaginally every 3 months 4. Recommend continued calcium with vitamin D supplementation twice a day 5. Screening labs ordered 6. Continue with healthy eating and exercise with control weight loss 7. Return in 1 year 8. Follow-up with Dr. Kary Kos regarding new onset Dizziness.   Health Maintenance for Postmenopausal Women Introduction Menopause is a normal process in which your reproductive ability comes to an end. This process happens gradually over a span of months to years, usually between the ages of 56 and 14. Menopause is complete when you have missed 12 consecutive menstrual periods. It is important to talk with your health care provider about some of the most common conditions that affect postmenopausal women, such as heart disease, cancer, and bone loss (osteoporosis). Adopting a healthy lifestyle and getting preventive care can help to promote your health and wellness. Those actions can also lower your chances of developing some of these common conditions. What should I know about menopause? During menopause, you may experience a number of symptoms, such as:  Moderate-to-severe hot flashes.  Night sweats.  Decrease in sex drive.  Mood swings.  Headaches.  Tiredness.  Irritability.  Memory problems.  Insomnia. Choosing to treat or not to treat menopausal changes is an individual decision that you make with your health care provider. What should I know about hormone replacement therapy and supplements? Hormone therapy products are effective for treating symptoms that are associated with menopause, such as hot flashes and night sweats. Hormone replacement carries certain risks, especially as you become older. If you are thinking about using estrogen or estrogen with progestin treatments, discuss the benefits and risks with your health care provider. What should I know about heart disease and  stroke? Heart disease, heart attack, and stroke become more likely as you age. This may be due, in part, to the hormonal changes that your body experiences during menopause. These can affect how your body processes dietary fats, triglycerides, and cholesterol. Heart attack and stroke are both medical emergencies. There are many things that you can do to help prevent heart disease and stroke:  Have your blood pressure checked at least every 1-2 years. High blood pressure causes heart disease and increases the risk of stroke.  If you are 41-52 years old, ask your health care provider if you should take aspirin to prevent a heart attack or a stroke.  Do not use any tobacco products, including cigarettes, chewing tobacco, or electronic cigarettes. If you need help quitting, ask your health care provider.  It is important to eat a healthy diet and maintain a healthy weight.  Be sure to include plenty of vegetables, fruits, low-fat dairy products, and lean protein.  Avoid eating foods that are high in solid fats, added sugars, or salt (sodium).  Get regular exercise. This is one of the most important things that you can do for your health.  Try to exercise for at least 150 minutes each week. The type of exercise that you do should increase your heart rate and make you sweat. This is known as moderate-intensity exercise.  Try to do strengthening exercises at least twice each week. Do these in addition to the moderate-intensity exercise.  Know your numbers.Ask your health care provider to check your cholesterol and your blood glucose. Continue to have your blood tested as directed by your health care provider. What should I know about cancer screening? There are several types of cancer. Take the following  steps to reduce your risk and to catch any cancer development as early as possible. Breast Cancer  Practice breast self-awareness.  This means understanding how your breasts normally appear  and feel.  It also means doing regular breast self-exams. Let your health care provider know about any changes, no matter how small.  If you are 63 or older, have a clinician do a breast exam (clinical breast exam or CBE) every year. Depending on your age, family history, and medical history, it may be recommended that you also have a yearly breast X-ray (mammogram).  If you have a family history of breast cancer, talk with your health care provider about genetic screening.  If you are at high risk for breast cancer, talk with your health care provider about having an MRI and a mammogram every year.  Breast cancer (BRCA) gene test is recommended for women who have family members with BRCA-related cancers. Results of the assessment will determine the need for genetic counseling and BRCA1 and for BRCA2 testing. BRCA-related cancers include these types:  Breast. This occurs in males or females.  Ovarian.  Tubal. This may also be called fallopian tube cancer.  Cancer of the abdominal or pelvic lining (peritoneal cancer).  Prostate.  Pancreatic. Cervical, Uterine, and Ovarian Cancer  Your health care provider may recommend that you be screened regularly for cancer of the pelvic organs. These include your ovaries, uterus, and vagina. This screening involves a pelvic exam, which includes checking for microscopic changes to the surface of your cervix (Pap test).  For women ages 21-65, health care providers may recommend a pelvic exam and a Pap test every three years. For women ages 76-65, they may recommend the Pap test and pelvic exam, combined with testing for human papilloma virus (HPV), every five years. Some types of HPV increase your risk of cervical cancer. Testing for HPV may also be done on women of any age who have unclear Pap test results.  Other health care providers may not recommend any screening for nonpregnant women who are considered low risk for pelvic cancer and have no  symptoms. Ask your health care provider if a screening pelvic exam is right for you.  If you have had past treatment for cervical cancer or a condition that could lead to cancer, you need Pap tests and screening for cancer for at least 20 years after your treatment. If Pap tests have been discontinued for you, your risk factors (such as having a new sexual partner) need to be reassessed to determine if you should start having screenings again. Some women have medical problems that increase the chance of getting cervical cancer. In these cases, your health care provider may recommend that you have screening and Pap tests more often.  If you have a family history of uterine cancer or ovarian cancer, talk with your health care provider about genetic screening.  If you have vaginal bleeding after reaching menopause, tell your health care provider.  There are currently no reliable tests available to screen for ovarian cancer. Lung Cancer  Lung cancer screening is recommended for adults 36-24 years old who are at high risk for lung cancer because of a history of smoking. A yearly low-dose CT scan of the lungs is recommended if you:  Currently smoke.  Have a history of at least 30 pack-years of smoking and you currently smoke or have quit within the past 15 years. A pack-year is smoking an average of one pack of cigarettes per day for  one year. Yearly screening should:  Continue until it has been 15 years since you quit.  Stop if you develop a health problem that would prevent you from having lung cancer treatment. Colorectal Cancer  This type of cancer can be detected and can often be prevented.  Routine colorectal cancer screening usually begins at age 81 and continues through age 55.  If you have risk factors for colon cancer, your health care provider may recommend that you be screened at an earlier age.  If you have a family history of colorectal cancer, talk with your health care provider  about genetic screening.  Your health care provider may also recommend using home test kits to check for hidden blood in your stool.  A small camera at the end of a tube can be used to examine your colon directly (sigmoidoscopy or colonoscopy). This is done to check for the earliest forms of colorectal cancer.  Direct examination of the colon should be repeated every 5-10 years until age 8. However, if early forms of precancerous polyps or small growths are found or if you have a family history or genetic risk for colorectal cancer, you may need to be screened more often. Skin Cancer  Check your skin from head to toe regularly.  Monitor any moles. Be sure to tell your health care provider:  About any new moles or changes in moles, especially if there is a change in a mole's shape or color.  If you have a mole that is larger than the size of a pencil eraser.  If any of your family members has a history of skin cancer, especially at a young age, talk with your health care provider about genetic screening.  Always use sunscreen. Apply sunscreen liberally and repeatedly throughout the day.  Whenever you are outside, protect yourself by wearing long sleeves, pants, a wide-brimmed hat, and sunglasses. What should I know about osteoporosis? Osteoporosis is a condition in which bone destruction happens more quickly than new bone creation. After menopause, you may be at an increased risk for osteoporosis. To help prevent osteoporosis or the bone fractures that can happen because of osteoporosis, the following is recommended:  If you are 25-65 years old, get at least 1,000 mg of calcium and at least 600 mg of vitamin D per day.  If you are older than age 28 but younger than age 72, get at least 1,200 mg of calcium and at least 600 mg of vitamin D per day.  If you are older than age 76, get at least 1,200 mg of calcium and at least 800 mg of vitamin D per day. Smoking and excessive alcohol intake  increase the risk of osteoporosis. Eat foods that are rich in calcium and vitamin D, and do weight-bearing exercises several times each week as directed by your health care provider. What should I know about how menopause affects my mental health? Depression may occur at any age, but it is more common as you become older. Common symptoms of depression include:  Low or sad mood.  Changes in sleep patterns.  Changes in appetite or eating patterns.  Feeling an overall lack of motivation or enjoyment of activities that you previously enjoyed.  Frequent crying spells. Talk with your health care provider if you think that you are experiencing depression. What should I know about immunizations? It is important that you get and maintain your immunizations. These include:  Tetanus, diphtheria, and pertussis (Tdap) booster vaccine.  Influenza every year before  the flu season begins.  Pneumonia vaccine.  Shingles vaccine. Your health care provider may also recommend other immunizations. This information is not intended to replace advice given to you by your health care provider. Make sure you discuss any questions you have with your health care provider. Document Released: 10/05/2005 Document Revised: 03/02/2016 Document Reviewed: 05/17/2015  2017 Elsevier

## 2016-10-09 NOTE — Telephone Encounter (Signed)
Patient called office stating she has been having dizziness along with headaches for about a week.  Dizziness has progressively gotten worse lasting most of the days now. Dizziness has caused nausea and trouble with vision.  Please call

## 2016-10-09 NOTE — Progress Notes (Signed)
Patient ID: [name] [name], female   DOB: 1965-09-06, 51 y.o.   MRN: [id number] ANNUAL PREVENTATIVE CARE GYN  ENCOUNTER NOTE  Subjective:       [name] [name] is a 51 y.o. G6 1141 . female here for a routine annual gynecologic exam.  Current complaints: 1. Wants e string refill last used 6 months ago;Vaginal dryness increasing. Nosignificant hot flashes 2. Dizziness-patient has noted onset of dizziness with room spinning approximately 1-2 weeks ago; no recent viral illness. Does have history of head trauma in past without similar episodes of dizziness noted. No new medications reported. 3. Obesity-patient is working with weight clinic in [place] to help control weight; recently started on metformin.   Gynecologic History No LMP recorded (lmp unknown). Patient has had a hysterectomy. Status post TAH/BSO Contraception: status post hysterectomy Last Pap: no pap needed. Results were: normal Last mammogram: 09/19/2015 birad 1 Results were: normal  Obstetric History OB History  Gravida Para Term Preterm AB Living  2 2 2         SAB TAB Ectopic Multiple Live Births          2    # Outcome Date GA Lbr Len/2nd Weight Sex Delivery Anes PTL Lv  2 Term           1 Term               Past Medical History:  Diagnosis Date  . Anginal pain ([place])   . Anxiety   . Asthma   . Atrial fibrillation ([place])   . Back pain   . Back pain    bulging disk  . Chest pain   . Epilepsy ([place])   . Fibroid   . GERD (gastroesophageal reflux disease)   . Head injury   . Joint pain   . Migraine   . Obesity, Class III, BMI 40-49.9 (morbid obesity) ([place]) 12/21/2012  . Ovarian cyst   . Prediabetes   . Seizures ([place])   . Shortness of breath dyspnea   . Surgical menopause   . TBI (traumatic brain injury) ([place])   . TIA (transient ischemic attack)   . Vitamin D deficiency     Past Surgical History:  Procedure Laterality Date  . ABDOMINAL HYSTERECTOMY     wiht bso  . CARDIAC CATHETERIZATION N/A 01/21/2015    Procedure: Left Heart Cath;  Surgeon: Yolonda Kida, MD;  Location: [facility];  Service: Cardiovascular;  Laterality: N/A;  . CESAREAN SECTION    . COLONOSCOPY N/A 11/21/2012   Procedure: COLONOSCOPY;  Surgeon: Irene Shipper, MD;  Location: [facility];  Service: Endoscopy;  Laterality: N/A;  . DILATION AND CURETTAGE OF UTERUS    . ESOPHAGOGASTRODUODENOSCOPY N/A 11/21/2012   Procedure: ESOPHAGOGASTRODUODENOSCOPY (EGD);  Surgeon: Irene Shipper, MD;  Location: [facility];  Service: Endoscopy;  Laterality: N/A;  . HERNIA REPAIR  12/17/12   laparoscopic incisional   . INCISIONAL HERNIA REPAIR N/A 12/17/2012   Procedure: LAPAROSCOPIC INCISIONAL HERNIA;  Surgeon: Harl Bowie, MD;  Location: [facility];  Service: General;  Laterality: N/A;  . INSERTION OF MESH N/A 12/17/2012   Procedure: INSERTION OF MESH;  Surgeon: Harl Bowie, MD;  Location: [facility];  Service: General;  Laterality: N/A;    Current Outpatient Prescriptions on File Prior to Visit  Medication Sig Dispense Refill  . albuterol (PROVENTIL HFA;VENTOLIN HFA) 108 (90 BASE) MCG/ACT inhaler Inhale 2 puffs into the lungs every 6 (six) hours as needed for wheezing.    Marland Kitchen  clonazePAM (KLONOPIN) 1 MG tablet Take 1 mg by mouth at bedtime as needed (for sleep and if she feels a seizure coming on).    . dihydroergotamine (MIGRANAL) 4 MG/ML nasal spray Place 1 spray into the nose as needed for migraine. Use in one nostril as directed.  No more than 4 sprays in one hour 8 mL 12  . lamoTRIgine (LAMICTAL) 100 MG tablet Take 1 tablet (100 mg total) by mouth 2 (two) times daily. 180 tablet 0  . metFORMIN (GLUCOPHAGE) 500 MG tablet Take 1 tablet (500 mg total) by mouth daily with breakfast. 30 tablet 0  . metoprolol tartrate (LOPRESSOR) 25 MG tablet Take 1 tablet (25 mg total) by mouth 2 (two) times daily. (Patient taking differently: Take 25 mg by mouth daily. ) 60 tablet 0  . promethazine (PHENERGAN) 50 MG tablet Take 0.5 tablets (25  mg total) by mouth every 6 (six) hours as needed for nausea or vomiting. 30 tablet 0  . topiramate (TOPAMAX) 100 MG tablet Take 1 tablet (100 mg total) by mouth 2 (two) times daily. 60 tablet 11  . Vitamin D, Ergocalciferol, (DRISDOL) 50000 units CAPS capsule TAKE 1 CAPSULE BY MOUTH ONCE A WEEK.  5   Current Facility-Administered Medications on File Prior to Visit  Medication Dose Route Frequency Provider Last Rate Last Dose  . fentaNYL (SUBLIMAZE) injection 50 mcg  50 mcg Intravenous UD Lance Bosch, MD      . iopamidol (ISOVUE-M) 41 % intrathecal injection 20 mL  20 mL Epidural PRN Lance Bosch, MD      . midazolam (VERSED) 5 MG/5ML injection 1 mg  1 mg Intravenous UD Lance Bosch, MD      . triamcinolone acetonide [facility]) injection 80 mg  80 mg Intramuscular Once Lance Bosch, MD      . valproate (DEPACON) 500 mg in dextrose 5 % 50 mL IVPB  500 mg Intravenous Once Larey Seat, MD        Allergies  Allergen Reactions  . Amoxicillin Hives and Swelling  . Biaxin [Clarithromycin] Hives  . Erythromycin Base Hives  . Penicillins Hives and Swelling  . Zithromax [Azithromycin] Hives and Nausea Only    Social History   Social History  . Marital status: Divorced    Spouse name: N/A  . Number of children: 2  . Years of education: 12+   Occupational History  . 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 OFFICE ASSISTANT  [place]   Social History Main Topics  . Smoking status: Never Smoker  . Smokeless tobacco: Never Used  . Alcohol use 0.0 oz/week  . Drug use: No  . Sexual activity: Yes    Birth control/ protection: Surgical   Other Topics Concern  . Not on file   Social History Narrative   Patient is single with 2 children   Patient is right handed   Patient has some college   Patient does not drink caffeine          Family History  Problem Relation Age of Onset  . Hypertension Mother   . Depression Mother   . Heart  disease Mother     deceased 01-Feb-2014  . Heart attack Father   . Hypertension Father   . Emphysema Father   . COPD Father   . Heart disease Father   . Diabetes Father   . Breast cancer Cousin   . Cancer Neg Hx   . Colon cancer Neg Hx   . Esophageal  cancer Neg Hx   . Stomach cancer Neg Hx   . Pancreatic cancer Neg Hx   . Liver disease Neg Hx     The following portions of the patient's history were reviewed and updated as appropriate: allergies, current medications, past family history, past medical history, past social history, past surgical history and problem list.  Review of Systems ROS Review of Systems - General ROS: negative for - chills, fatigue, fever, hot flashes, night sweats, weight gain or weight loss Psychological ROS: negative for - anxiety, decreased libido, depression, mood swings, physical abuse or sexual abuse Ophthalmic ROS: negative for - blurry vision, eye pain or loss of vision ENT ROS: negative for - headaches, hearing change, visual changes or vocal changes Allergy and Immunology ROS: negative for - hives, itchy/watery eyes or seasonal allergies Hematological and Lymphatic ROS: negative for - bleeding problems, bruising, swollen lymph nodes or weight loss Endocrine ROS: negative for - galactorrhea, hair pattern changes, hot flashes, malaise/lethargy, mood swings, palpitations, polydipsia/polyuria, skin changes, temperature intolerance or unexpected weight changes Breast ROS: negative for - new or changing breast lumps or nipple discharge Respiratory ROS: negative for - cough or shortness of breath Cardiovascular ROS: negative for - chest pain, irregular heartbeat, palpitations or shortness of breath Gastrointestinal ROS: no abdominal pain, change in bowel habits, or black or bloody stools Genito-Urinary ROS: no dysuria, trouble voiding, or hematuria.  POSITIVE-vaginal itching Musculoskeletal ROS: negative for - joint pain or joint stiffness Neurological ROS:  negative for - bowel and bladder control changes Dermatological ROS: negative for rash and skin lesion changes   Objective:   BP 123/73   Pulse 69   Ht 5\' 3"  (1.6 m)   Wt 280 lb 9.6 oz (127.3 kg)   LMP  (LMP Unknown) Comment: 2005  BMI 49.71 kg/m  CONSTITUTIONAL: Well-developed, well-nourished female in no acute distress.  PSYCHIATRIC: Normal mood and affect. Normal behavior. Normal judgment and thought content. [place]: Alert and oriented to person, place, and time. Normal muscle tone coordination. No cranial nerve deficit noted. HENT:  Normocephalic, atraumatic, External right and left ear normal. Oropharynx is clear and moist EYES: Conjunctivae and EOM are normal. Pupils are equal, round, and reactive to light. No scleral icterus. No nystagmus NECK: Normal range of motion, supple, no masses.  Normal thyroid.  SKIN: Skin is warm and dry. No rash noted. Not diaphoretic. No erythema. No pallor. CARDIOVASCULAR: Normal heart rate noted, regular rhythm, no murmur. RESPIRATORY: Clear to auscultation bilaterally. Effort and breath sounds normal but distant, no problems with respiration noted. BREASTS: Symmetric in size. No masses, skin changes, nipple drainage, or lymphadenopathy. ABDOMEN: Soft, normal bowel sounds, no distention noted.  No tenderness, rebound or guarding.  BLADDER: Normal PELVIC:  External Genitalia: Normal  BUS: Normal  Vagina: Normal; good vault support; good estrogen effect  Cervix: surgically absent  Uterus: surgically absent  Adnexa: Normal  RV: External Exam NormaI, No Rectal Masses and Normal Sphincter tone  MUSCULOSKELETAL: Normal range of motion. No tenderness.  No cyanosis, clubbing, or edema.  2+ distal pulses. LYMPHATIC: No Axillary, Supraclavicular, or Inguinal Adenopathy.    Assessment:   Annual gynecologic examination 51 y.o. Contraception: status post hysterectomy TAH/BSO [id number] Surgical menopause  Plan:  Pap: No further paps  needed Mammogram:scheudled for 11/06/2016  Stool Guaiac Testing:  Not Indicated Labs: vit d tsh fbs a1c lipid-  Routine preventative health maintenance measures emphasized: Exercise/Diet/Weight control, Tobacco Warnings and Alcohol/Substance use risks  Estring 2 mg every 3 months is  refilled Patient is to follow up with Dr. Kary Kos regarding new-onset dizziness; may need neurology and or ear nose throat follow-up Calcium with vitamin D supplementation is encouraged Return to [place], CMA  Brayton Mars, MD   Note: This dictation was prepared with Dragon dictation along with smaller phrase technology. Any transcriptional errors that result from this process are unintentional.

## 2016-10-10 LAB — LIPID PANEL
CHOLESTEROL TOTAL: 130 mg/dL (ref 100–199)
Chol/HDL Ratio: 3.2 ratio units (ref 0.0–4.4)
HDL: 41 mg/dL (ref >39)
LDL CALC: 72 mg/dL (ref 0–99)
Triglycerides: 84 mg/dL (ref 0–149)
VLDL CHOLESTEROL CAL: 17 mg/dL (ref 5–40)

## 2016-10-10 LAB — VITAMIN D 25 HYDROXY (VIT D DEFICIENCY, FRACTURES): Vit D, 25-Hydroxy: 23.5 ng/mL — ABNORMAL LOW (ref 30.0–100.0)

## 2016-10-10 LAB — HEMOGLOBIN A1C
ESTIMATED AVERAGE GLUCOSE: 111 mg/dL
Hgb A1c MFr Bld: 5.5 % (ref 4.8–5.6)

## 2016-10-10 LAB — TSH: TSH: 1.97 u[IU]/mL (ref 0.450–4.500)

## 2016-10-10 LAB — GLUCOSE, RANDOM: Glucose: 89 mg/dL (ref 65–99)

## 2016-10-10 LAB — HIV ANTIBODY (ROUTINE TESTING W REFLEX): HIV Screen 4th Generation wRfx: NONREACTIVE

## 2016-10-11 ENCOUNTER — Telehealth (INDEPENDENT_AMBULATORY_CARE_PROVIDER_SITE_OTHER): Payer: Self-pay | Admitting: Family Medicine

## 2016-10-11 ENCOUNTER — Ambulatory Visit (INDEPENDENT_AMBULATORY_CARE_PROVIDER_SITE_OTHER): Payer: BC Managed Care – PPO | Admitting: Family Medicine

## 2016-10-11 ENCOUNTER — Encounter (INDEPENDENT_AMBULATORY_CARE_PROVIDER_SITE_OTHER): Payer: Self-pay

## 2016-10-11 NOTE — Telephone Encounter (Signed)
Patient called to cancel appointment for today at 5:00pm, states she was at the doctor yesterday and was diagnosed with vertigo and sinus infection and today while at work, she passed out and her sister had to come get her from work. As I was in the process of re-scheduling appointment, patient remembered she has an upcoming appointment with Neurology and wants to call back to reschedule so that she doesn't have a conflict and would end up having to reschedule again. Patient has been previously advised of $25 no show/cancel < 48 hr fee by Dr. Leafy Ro in information session.

## 2016-10-22 ENCOUNTER — Telehealth: Payer: Self-pay | Admitting: Obstetrics and Gynecology

## 2016-10-22 NOTE — Telephone Encounter (Signed)
Pt called and she received a coupon for the estrodial for 100.00, however the medicine through her insurance is 440.00 and the coupon only took off 100.00 and it still expensive so she needs to know what else she can do or use.

## 2016-10-24 MED ORDER — PRASTERONE 6.5 MG VA INST: 6.5000 mg | VAGINAL_INSERT | Freq: Every day | VAGINAL | 6 refills | Status: DC

## 2016-10-24 NOTE — Telephone Encounter (Signed)
Pt aware. Sample left up front. Med erx.

## 2016-10-29 ENCOUNTER — Encounter: Payer: Self-pay | Admitting: Adult Health

## 2016-10-29 ENCOUNTER — Ambulatory Visit: Payer: BC Managed Care – PPO | Admitting: Adult Health

## 2016-10-29 VITALS — BP 129/71 | HR 69 | Ht 63.0 in | Wt 282.0 lb

## 2016-10-29 DIAGNOSIS — Z5181 Encounter for therapeutic drug level monitoring: Secondary | ICD-10-CM

## 2016-10-29 DIAGNOSIS — Z87820 Personal history of traumatic brain injury: Secondary | ICD-10-CM | POA: Diagnosis not present

## 2016-10-29 DIAGNOSIS — G43019 Migraine without aura, intractable, without status migrainosus: Secondary | ICD-10-CM

## 2016-10-29 DIAGNOSIS — G40909 Epilepsy, unspecified, not intractable, without status epilepticus: Secondary | ICD-10-CM

## 2016-10-29 NOTE — Progress Notes (Signed)
I agree with the assessment and plan as directed by NP .The patient is known to me .   Sharnelle Cappelli, MD  

## 2016-10-29 NOTE — Progress Notes (Signed)
PATIENT: [name] [name] DOB: 01-14-1966  REASON FOR VISIT: follow up- history of traumatic brain injury, migraine, seizures HISTORY FROM: patient  HISTORY OF PRESENT ILLNESS: [name] [name] is a 51 year old female with a history of traumatic brain injury with subsequent migraines and seizures. She returns today for follow-up. She is currently on Topamax and Lamictal for seizures. She denies any seizure events. She continues to have 2-4 headaches a week. Her headache typically occurs all over her head extending down the neck into the shoulders. She does have photophobia, phonophobia, nausea and vomiting. In the past she has tried and failed several different medications for her migraines. This includes Lamictal, Topamax, Imitrex, gabapentin, Migranal, Excedrin, Depakote and Botox. She also reports that since her head injury she has had trouble with her gait. She reports that she is in physical therapy currently for back pain. She also sees Dr. Leafy Ro for weight management. She returns today for an evaluation.  HISTORY 07/23/16: [name] [name] is a 51 year old female with a history of traumatic brain injury migraines and seizures. I have listed below a multitude of medications for her migraines -all of which have been unsuccessful.  She is currently on Topamax 200 mg daily and Lamictal 100 mg twice a day. These medications often help headaches as well but has failed her. Topiramate and Lamictal are used for seizure control in her case. She has not had a seizure in almost 7 years. She states that she continues to have daily headaches. Her headache location can vary. She does confirm photophobia, phonophobia, nausea and vomiting. She has tried Botox injections but reports she had an allergic reaction- swelling around the lips. Patient reports that she has not had any seizure events. She continues to have dizzy episodes. She had a very difficult time the last 2-1/2 years since she suffered a traumatic brain  injury, which exacerbated her headaches. She remains morbidly obese.   HISTORY 07/20/15: [name] [name] is a 51 y.o. female here as a referral from Dr. Kary Kos for Migraines and to follow up on Botox treatment. She only as treated once, developed lip swelling.  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 Migraines since a child at the age of 85. Migraines are all over the head, throbbing and pounding. She endorses light sensitivity, she lays in her room in the dark, staying still helps, smells trigger the migraine, she gets nausea and vomiting. She is having the headaches on average 20 headache days a month, over 15 a month are migrainous, for years. They last 24 hours or longer. She has had a migraines for 4 days now and is a 7-8/10. The pain can become 10/10. She has tried multiple medications.  [name] has undergoneBotox injections - the first one- just yesterday with Dr. Jaynee Eagles.  These have helped significantly she is able to move her neck range of motion is not as limited the degree of headaches is much reduced and the frequency as well. She will need today some refills on her medication, namely lamotrigine. She also appears less depressed than when I saw her last.   REVIEW OF SYSTEMS: Out of a complete 14 system review of symptoms, the patient complains only of the following symptoms, and all other reviewed systems are negative.  Dizziness, headache, numbness, joint pain, back pain, muscle cramps, walking difficulty, frequent waking, daytime sleepiness, bruise easily, runny nose  ALLERGIES: Allergies  Allergen Reactions  . Amoxicillin Hives and  Swelling  . Biaxin [Clarithromycin] Hives  . Erythromycin Base Hives  . Penicillins Hives and Swelling  . Zithromax [Azithromycin] Hives and Nausea Only    HOME MEDICATIONS: Outpatient Medications Prior to Visit  Medication Sig Dispense  Refill  . albuterol (PROVENTIL HFA;VENTOLIN HFA) 108 (90 BASE) MCG/ACT inhaler Inhale 2 puffs into the lungs every 6 (six) hours as needed for wheezing.    . clonazePAM (KLONOPIN) 1 MG tablet Take 1 mg by mouth at bedtime as needed (for sleep and if she feels a seizure coming on).    . dihydroergotamine (MIGRANAL) 4 MG/ML nasal spray Place 1 spray into the nose as needed for migraine. Use in one nostril as directed.  No more than 4 sprays in one hour 8 mL 12  . DULoxetine (CYMBALTA) 30 MG capsule Take one capsule (30mg ) every morning for 7 days then increase to two capsules (60mg ) thereafter    . gabapentin (NEURONTIN) 300 MG capsule Take by mouth.    . lamoTRIgine (LAMICTAL) 100 MG tablet Take 1 tablet (100 mg total) by mouth 2 (two) times daily. 180 tablet 0  . metFORMIN (GLUCOPHAGE) 500 MG tablet Take 1 tablet (500 mg total) by mouth daily with breakfast. 30 tablet 0  . metoprolol tartrate (LOPRESSOR) 25 MG tablet Take 1 tablet (25 mg total) by mouth 2 (two) times daily. (Patient taking differently: Take 25 mg by mouth daily. ) 60 tablet 0  . Prasterone (INTRAROSA) 6.5 MG INST Place 6.5 mg vaginally at bedtime. 30 each 6  . promethazine (PHENERGAN) 50 MG tablet Take 0.5 tablets (25 mg total) by mouth every 6 (six) hours as needed for nausea or vomiting. 30 tablet 0  . ranitidine (ZANTAC) 300 MG tablet Take by mouth.    . topiramate (TOPAMAX) 100 MG tablet Take 1 tablet (100 mg total) by mouth 2 (two) times daily. 60 tablet 11  . Vitamin D, Ergocalciferol, (DRISDOL) 50000 units CAPS capsule TAKE 1 CAPSULE BY MOUTH ONCE A WEEK.  5   Facility-Administered Medications Prior to Visit  Medication Dose Route Frequency Provider Last Rate Last Dose  . fentaNYL (SUBLIMAZE) injection 50 mcg  50 mcg Intravenous UD Lance Bosch, MD        PAST MEDICAL HISTORY: Past Medical History:  Diagnosis Date  . Anginal pain ([place])   . Anxiety   . Asthma   . Atrial fibrillation ([place])   . Back pain   . Back  pain    bulging disk  . Chest pain   . Epilepsy ([place])   . Fibroid   . GERD (gastroesophageal reflux disease)   . Head injury   . Joint pain   . Migraine   . Obesity, Class III, BMI 40-49.9 (morbid obesity) ([place]) 12/21/2012  . Ovarian cyst   . Prediabetes   . Seizures ([place])   . Shortness of breath dyspnea   . Surgical menopause   . TBI (traumatic brain injury) ([place])   . TIA (transient ischemic attack)   . Vitamin D deficiency     PAST SURGICAL HISTORY: Past Surgical History:  Procedure Laterality Date  . ABDOMINAL HYSTERECTOMY     wiht bso  . CARDIAC CATHETERIZATION N/A 01/21/2015   Procedure: Left Heart Cath;  Surgeon: Yolonda Kida, MD;  Location: [facility];  Service: Cardiovascular;  Laterality: N/A;  . CESAREAN SECTION    . COLONOSCOPY N/A 11/21/2012   Procedure: COLONOSCOPY;  Surgeon: Irene Shipper, MD;  [facility];  Service: Endoscopy;  Laterality: N/A;  . DILATION AND CURETTAGE OF UTERUS    . ESOPHAGOGASTRODUODENOSCOPY N/A 11/21/2012   Procedure: ESOPHAGOGASTRODUODENOSCOPY (EGD);  Surgeon: Irene Shipper, MD;  Location: [facility];  Service: Endoscopy;  Laterality: N/A;  . HERNIA REPAIR  12/17/12   laparoscopic incisional   . INCISIONAL HERNIA REPAIR N/A 12/17/2012   Procedure: LAPAROSCOPIC INCISIONAL HERNIA;  Surgeon: Harl Bowie, MD;  Location: [facility];  Service: General;  Laterality: N/A;  . INSERTION OF MESH N/A 12/17/2012   Procedure: INSERTION OF MESH;  Surgeon: Harl Bowie, MD;  Location: [facility];  Service: General;  Laterality: N/A;    FAMILY HISTORY: Family History  Problem Relation Age of Onset  . Hypertension Mother   . Depression Mother   . Heart disease Mother     deceased February 14, 2014  . Heart attack Father   . Hypertension Father   . Emphysema Father   . COPD Father   . Heart disease Father   . Diabetes Father   . Breast cancer Cousin   . Cancer Neg Hx   . Colon cancer Neg Hx   . Esophageal cancer Neg Hx   .  Stomach cancer Neg Hx   . Pancreatic cancer Neg Hx   . Liver disease Neg Hx     SOCIAL HISTORY: Social History   Social History  . Marital status: Divorced    Spouse name: N/A  . Number of children: 2  . Years of education: 12+   Occupational History  . 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 Smoking status: Never Smoker  . Smokeless tobacco: Never Used  . Alcohol use 0.0 oz/week     Comment: rare  . Drug use: No  . Sexual activity: Yes    Birth control/ protection: Surgical   Other Topics Concern  . Not on file   Social History Narrative   Patient is single with 2 children   Patient is right handed   Patient has some college   Patient does not drink caffeine            PHYSICAL EXAM  Vitals:   10/29/16 1444  BP: 129/71  Pulse: 69  Weight: 282 lb (127.9 kg)  Height: 5\' 3"  (1.6 m)   Body mass index is 49.95 kg/m.  Generalized: Well developed, in no acute distress   Neurological examination  Mentation: Alert oriented to time, place, history taking. Follows all commands speech and language fluent Cranial nerve II-XII: Pupils were equal round reactive to light. Extraocular movements were full, visual field were full on confrontational test. Facial sensation and strength were normal. Uvula tongue midline. Head turning and shoulder shrug  were normal and symmetric. Motor: The motor testing reveals 5 over 5 strength of all 4 extremities. Good symmetric motor tone is noted throughout.  Sensory: Sensory testing is intact to soft touch on all 4 extremities. No evidence of extinction is noted.  Coordination: Cerebellar testing reveals good finger-nose-finger and heel-to-shin bilaterally.  Gait and station: Gait is normal. Tandem gait is normal. Romberg is negative. No drift is seen.  Reflexes: Deep tendon reflexes are symmetric and normal bilaterally.   DIAGNOSTIC DATA  (LABS, IMAGING, TESTING) - I reviewed patient records, labs, notes, testing and imaging myself where available.  Lab Results  Component Value Date   WBC 6.3 09/10/2016   HGB 12.8 06/28/2016   HCT 43.2 09/10/2016   MCV 80 09/10/2016  PLT 220 09/10/2016      Component Value Date/Time   NA 140 09/10/2016 1146   NA 140 12/01/2014 1109   K 4.2 09/10/2016 1146   K 3.8 12/01/2014 1109   CL 105 09/10/2016 1146   CL 111 12/01/2014 1109   CO2 19 09/10/2016 1146   CO2 23 12/01/2014 1109   GLUCOSE 89 10/09/2016 0924   GLUCOSE 117 (H) 06/28/2016 2152   GLUCOSE 99 12/01/2014 1109   BUN 12 09/10/2016 1146   BUN 17 12/01/2014 1109   CREATININE 0.83 09/10/2016 1146   CREATININE 0.83 12/01/2014 1109   CREATININE 0.82 06/22/2013 1152   CALCIUM 9.0 09/10/2016 1146   CALCIUM 8.7 (L) 12/01/2014 1109   PROT 7.5 09/10/2016 1146   PROT 7.7 09/26/2014 1837   ALBUMIN 4.2 09/10/2016 1146   ALBUMIN 3.5 09/26/2014 1837   AST 16 09/10/2016 1146   AST 17 09/26/2014 1837   ALT 17 09/10/2016 1146   ALT 24 09/26/2014 1837   ALKPHOS 120 (H) 09/10/2016 1146   ALKPHOS 116 09/26/2014 1837   BILITOT 0.3 09/10/2016 1146   BILITOT 0.2 09/26/2014 1837   GFRNONAA 83 09/10/2016 1146   GFRNONAA >60 12/01/2014 1109   GFRAA 96 09/10/2016 1146   GFRAA >60 12/01/2014 1109   Lab Results  Component Value Date   CHOL 130 10/09/2016   HDL 41 10/09/2016   LDLCALC 72 10/09/2016   TRIG 84 10/09/2016   CHOLHDL 3.2 10/09/2016   Lab Results  Component Value Date   HGBA1C 5.5 10/09/2016   Lab Results  Component Value Date   VITAMINB12 491 09/10/2016   Lab Results  Component Value Date   TSH 1.970 10/09/2016      ASSESSMENT AND PLAN 51 y.o. year old female  has a past medical history of Anginal pain ([place]); Anxiety; Asthma; Atrial fibrillation ([place]); Back pain; Back pain; Chest pain; Epilepsy ([place]); Fibroid; GERD (gastroesophageal reflux disease); Head injury; Joint pain; Migraine; Obesity, Class III, BMI  40-49.9 (morbid obesity) ([place]) (12/21/2012); Ovarian cyst; Prediabetes; Seizures ([place]); Shortness of breath dyspnea; Surgical menopause; TBI (traumatic brain injury) ([place]); TIA (transient ischemic attack); and Vitamin D deficiency. here with:  1. History of traumatic brain injury 2. Migraine 3. Seizure  She will continue on Lamictal and topamax for her seizures. I will check blood work today. The patient has been on a multitude of medication for her migraines with no benefit. I will refer the patient for a second opinion at [facility]. The patient is amenable to this plan. We also discussed potentially trying a muscle relaxer for her headaches however she prefers to have the second opinion first. Patient advised that if her symptoms worsen or she develops new symptoms she should let us know. She will follow-up in 6 months or sooner if needed.  Ward Givens, MSN, NP-C 10/29/2016, 2:58 PM [facility] [address], [place], [place] [address] [phone]

## 2016-10-29 NOTE — Patient Instructions (Addendum)
Referral to headache clinic for second opinion- Dr. Beatris Si If your symptoms worsen or you develop new symptoms please let us know.

## 2016-10-30 LAB — COMPREHENSIVE METABOLIC PANEL
A/G RATIO: 1.3 (ref 1.2–2.2)
ALBUMIN: 3.9 g/dL (ref 3.5–5.5)
ALT: 15 IU/L (ref 0–32)
AST: 11 IU/L (ref 0–40)
Alkaline Phosphatase: 117 IU/L (ref 39–117)
BUN / CREAT RATIO: 14 (ref 9–23)
BUN: 13 mg/dL (ref 6–24)
Bilirubin Total: 0.2 mg/dL (ref 0.0–1.2)
CALCIUM: 8.7 mg/dL (ref 8.7–10.2)
CO2: 20 mmol/L (ref 18–29)
Chloride: 108 mmol/L — ABNORMAL HIGH (ref 96–106)
Creatinine, Ser: 0.92 mg/dL (ref 0.57–1.00)
GFR, EST AFRICAN AMERICAN: 85 mL/min/{1.73_m2} (ref >59)
GFR, EST NON AFRICAN AMERICAN: 73 mL/min/{1.73_m2} (ref >59)
GLOBULIN, TOTAL: 3.1 g/dL (ref 1.5–4.5)
Glucose: 84 mg/dL (ref 65–99)
POTASSIUM: 4.1 mmol/L (ref 3.5–5.2)
SODIUM: 144 mmol/L (ref 134–144)
TOTAL PROTEIN: 7 g/dL (ref 6.0–8.5)

## 2016-10-30 LAB — CBC WITH DIFFERENTIAL/PLATELET
BASOS: 0 %
Basophils Absolute: 0 10*3/uL (ref 0.0–0.2)
EOS (ABSOLUTE): 0.1 10*3/uL (ref 0.0–0.4)
EOS: 1 %
HEMATOCRIT: 38.5 % (ref 34.0–46.6)
Hemoglobin: 12.4 g/dL (ref 11.1–15.9)
Immature Grans (Abs): 0 10*3/uL (ref 0.0–0.1)
Immature Granulocytes: 0 %
Lymphocytes Absolute: 3.2 10*3/uL — ABNORMAL HIGH (ref 0.7–3.1)
Lymphs: 48 %
MCH: 26.6 pg (ref 26.6–33.0)
MCHC: 32.2 g/dL (ref 31.5–35.7)
MCV: 83 fL (ref 79–97)
MONOS ABS: 0.4 10*3/uL (ref 0.1–0.9)
Monocytes: 6 %
NEUTROS ABS: 3.1 10*3/uL (ref 1.4–7.0)
Neutrophils: 45 %
PLATELETS: 196 10*3/uL (ref 150–379)
RBC: 4.66 x10E6/uL (ref 3.77–5.28)
RDW: 16.3 % — AB (ref 12.3–15.4)
WBC: 6.9 10*3/uL (ref 3.4–10.8)

## 2016-10-30 LAB — LAMOTRIGINE LEVEL: Lamotrigine Lvl: 6.1 ug/mL (ref 2.0–20.0)

## 2016-10-31 ENCOUNTER — Telehealth: Payer: Self-pay

## 2016-10-31 NOTE — Telephone Encounter (Signed)
-----   Message from Ward Givens, NP sent at 10/30/2016  5:37 PM EST ----- Please call patient

## 2016-10-31 NOTE — Telephone Encounter (Signed)
I spoke to patient and she is aware of lab results.

## 2016-11-05 ENCOUNTER — Encounter (INDEPENDENT_AMBULATORY_CARE_PROVIDER_SITE_OTHER): Payer: Self-pay | Admitting: Family Medicine

## 2016-11-05 ENCOUNTER — Encounter: Payer: Self-pay | Admitting: Obstetrics and Gynecology

## 2016-11-05 NOTE — Telephone Encounter (Signed)
Please reschedule

## 2016-11-06 ENCOUNTER — Ambulatory Visit: Payer: BC Managed Care – PPO

## 2016-11-09 ENCOUNTER — Other Ambulatory Visit: Payer: Self-pay

## 2016-11-09 MED ORDER — ESTRADIOL 1 MG PO TABS: 1.0000 mg | ORAL_TABLET | Freq: Every day | ORAL | 1 refills | Status: DC

## 2016-11-21 ENCOUNTER — Telehealth (INDEPENDENT_AMBULATORY_CARE_PROVIDER_SITE_OTHER): Payer: Self-pay | Admitting: Family Medicine

## 2016-11-21 ENCOUNTER — Ambulatory Visit: Payer: BC Managed Care – PPO | Admitting: Physical Therapy

## 2016-11-21 NOTE — Telephone Encounter (Signed)
[facility] called for rx refill metFORMIN (GLUCOPHAGE) 500 MG tablet. Please call [phone] may speak with anyone.

## 2016-11-21 NOTE — Telephone Encounter (Signed)
Thank you. The medication was called in to the pharmacy.

## 2016-11-23 ENCOUNTER — Encounter (HOSPITAL_COMMUNITY): Payer: Self-pay

## 2016-11-23 ENCOUNTER — Emergency Department (HOSPITAL_COMMUNITY): Payer: BC Managed Care – PPO

## 2016-11-23 ENCOUNTER — Emergency Department (HOSPITAL_COMMUNITY)
Admission: EM | Admit: 2016-11-23 | Discharge: 2016-11-24 | Disposition: A | Discharge status: Home / Self Care | Payer: BC Managed Care – PPO | Attending: Emergency Medicine | Admitting: Emergency Medicine

## 2016-11-23 DIAGNOSIS — Z79899 Other long term (current) drug therapy: Secondary | ICD-10-CM | POA: Insufficient documentation

## 2016-11-23 DIAGNOSIS — R109 Unspecified abdominal pain: Secondary | ICD-10-CM | POA: Diagnosis present

## 2016-11-23 DIAGNOSIS — J45909 Unspecified asthma, uncomplicated: Secondary | ICD-10-CM | POA: Insufficient documentation

## 2016-11-23 DIAGNOSIS — Z7984 Long term (current) use of oral hypoglycemic drugs: Secondary | ICD-10-CM | POA: Diagnosis not present

## 2016-11-23 DIAGNOSIS — K529 Noninfective gastroenteritis and colitis, unspecified: Secondary | ICD-10-CM | POA: Diagnosis not present

## 2016-11-23 DIAGNOSIS — Z8673 Personal history of transient ischemic attack (TIA), and cerebral infarction without residual deficits: Secondary | ICD-10-CM | POA: Diagnosis not present

## 2016-11-23 LAB — LIPASE, BLOOD: LIPASE: 11 U/L (ref 11–51)

## 2016-11-23 LAB — COMPREHENSIVE METABOLIC PANEL
ALBUMIN: 3.9 g/dL (ref 3.5–5.0)
ALK PHOS: 104 U/L (ref 38–126)
ALT: 18 U/L (ref 14–54)
ANION GAP: 8 (ref 5–15)
AST: 16 U/L (ref 15–41)
BUN: 14 mg/dL (ref 6–20)
CALCIUM: 9.1 mg/dL (ref 8.9–10.3)
CHLORIDE: 110 mmol/L (ref 101–111)
CO2: 21 mmol/L — ABNORMAL LOW (ref 22–32)
Creatinine, Ser: 0.78 mg/dL (ref 0.44–1.00)
GFR calc non Af Amer: >60 mL/min (ref >60)
GLUCOSE: 104 mg/dL — AB (ref 65–99)
POTASSIUM: 4.1 mmol/L (ref 3.5–5.1)
SODIUM: 139 mmol/L (ref 135–145)
Total Bilirubin: 0.6 mg/dL (ref 0.3–1.2)
Total Protein: 7.4 g/dL (ref 6.5–8.1)

## 2016-11-23 LAB — CBC
HEMATOCRIT: 41 % (ref 36.0–46.0)
HEMOGLOBIN: 13.6 g/dL (ref 12.0–15.0)
MCH: 26.9 pg (ref 26.0–34.0)
MCHC: 33.2 g/dL (ref 30.0–36.0)
MCV: 81.2 fL (ref 78.0–100.0)
Platelets: 172 10*3/uL (ref 150–400)
RBC: 5.05 MIL/uL (ref 3.87–5.11)
RDW: 15.1 % (ref 11.5–15.5)
WBC: 7 10*3/uL (ref 4.0–10.5)

## 2016-11-23 LAB — URINALYSIS, ROUTINE W REFLEX MICROSCOPIC
BACTERIA UA: NONE SEEN
Bilirubin Urine: NEGATIVE
GLUCOSE, UA: NEGATIVE mg/dL
KETONES UR: NEGATIVE mg/dL
Leukocytes, UA: NEGATIVE
Nitrite: NEGATIVE
PROTEIN: NEGATIVE mg/dL
Specific Gravity, Urine: 1.02 (ref 1.005–1.030)
pH: 5 (ref 5.0–8.0)

## 2016-11-23 LAB — POC URINE PREG, ED: PREG TEST UR: NEGATIVE

## 2016-11-23 MED ORDER — HYDROCODONE-ACETAMINOPHEN 5-325 MG PO TABS: 1.0000 | ORAL_TABLET | Freq: Four times a day (QID) | ORAL | 0 refills | Status: DC | PRN

## 2016-11-23 MED ORDER — SODIUM CHLORIDE 0.9 % IV BOLUS (SEPSIS)
1000.0000 mL | Freq: Once | INTRAVENOUS | Status: AC
  Administered 2016-11-23: 1000 mL via INTRAVENOUS

## 2016-11-23 MED ORDER — KETOROLAC TROMETHAMINE 30 MG/ML IJ SOLN
30.0000 mg | Freq: Once | INTRAMUSCULAR | Status: AC
  Administered 2016-11-23: 30 mg via INTRAVENOUS
  Filled 2016-11-23: qty 1

## 2016-11-23 MED ORDER — ONDANSETRON HCL 4 MG/2ML IJ SOLN
4.0000 mg | Freq: Once | INTRAMUSCULAR | Status: AC
  Administered 2016-11-23: 4 mg via INTRAVENOUS
  Filled 2016-11-23: qty 2

## 2016-11-23 MED ORDER — MORPHINE SULFATE (PF) 4 MG/ML IV SOLN
4.0000 mg | Freq: Once | INTRAVENOUS | Status: AC
  Administered 2016-11-23: 4 mg via INTRAVENOUS
  Filled 2016-11-23: qty 1

## 2016-11-23 MED ORDER — PROMETHAZINE HCL 25 MG/ML IJ SOLN
12.5000 mg | Freq: Once | INTRAMUSCULAR | Status: AC
  Administered 2016-11-23: 12.5 mg via INTRAVENOUS
  Filled 2016-11-23: qty 1

## 2016-11-23 MED ORDER — IOPAMIDOL (ISOVUE-300) INJECTION 61%
INTRAVENOUS | Status: AC
  Administered 2016-11-23: 100 mL
  Filled 2016-11-23: qty 100

## 2016-11-23 MED ORDER — ONDANSETRON 4 MG PO TBDP
4.0000 mg | ORAL_TABLET | Freq: Once | ORAL | Status: AC | PRN
  Administered 2016-11-23: 4 mg via ORAL

## 2016-11-23 MED ORDER — ONDANSETRON HCL 8 MG PO TABS: 8.0000 mg | ORAL_TABLET | Freq: Three times a day (TID) | ORAL | 0 refills | Status: DC | PRN

## 2016-11-23 MED ORDER — ONDANSETRON 4 MG PO TBDP
ORAL_TABLET | ORAL | Status: AC
  Filled 2016-11-23: qty 1

## 2016-11-23 NOTE — ED Provider Notes (Signed)
[facility] DEPT Provider Note   CSN: [id number] Arrival date & time: 11/23/16  2021     History   Chief Complaint Chief Complaint  Patient presents with  . Abdominal Pain  . Nausea  . Diarrhea    HPI ADDISSON FRATE is a 51 y.o. female.  Patient is a 51 year old female with history of obesity, seizures, A. fib, asthma. She presents for evaluation of abdominal pain, nausea, and vomiting that started earlier today. She denies any fevers or chills. She denies any bloody stool or emesis. She denies any ill contacts. He feels as though her abdomen is more distended. Prior surgical history includes appendectomy.   The history is provided by the patient.  Abdominal Pain   This is a new problem. The current episode started 3 to 5 hours ago. The problem occurs constantly. The problem has been gradually worsening. The pain is associated with eating. The pain is located in the generalized abdominal region. The quality of the pain is cramping. Pertinent negatives include fever and dysuria. The symptoms are aggravated by palpation. Nothing relieves the symptoms.    Past Medical History:  Diagnosis Date  . Anginal pain ([place])   . Anxiety   . Asthma   . Atrial fibrillation ([place])   . Back pain   . Back pain    bulging disk  . Chest pain   . Epilepsy ([place])   . Fibroid   . GERD (gastroesophageal reflux disease)   . Head injury   . Joint pain   . Migraine   . Obesity, Class III, BMI 40-49.9 (morbid obesity) ([place]) 12/21/2012  . Ovarian cyst   . Prediabetes   . Seizures ([place])   . Shortness of breath dyspnea   . Surgical menopause   . TBI (traumatic brain injury) ([place])   . TIA (transient ischemic attack)   . Vitamin D deficiency     Patient Active Problem List   Diagnosis Date Noted  . Surgical menopause on hormone replacement therapy 09/08/2015  . Incontinence in female 09/08/2015  . Back pain at L4-L5 level 09/07/2015  . DDD (degenerative disc disease), lumbosacral  09/07/2015  . Lumbosacral radiculopathy 09/07/2015  . Migraine with aura and with status migrainosus, not intractable 07/20/2015  . Subarachnoid hemorrhage following injury, with loss of consciousness ([place]) 07/20/2015  . TBI (traumatic brain injury) ([place]) 07/20/2015  . Partial symptomatic epilepsy with complex partial seizures, not intractable, without status epilepticus ([place]) 07/20/2015  . Intractable migraine with aura with status migrainosus 04/27/2015  . Chronic post-traumatic headache, not intractable 04/27/2015  . Morbid obesity ([place])   . TIA (transient ischemic attack) 04/25/2015  . Paresthesia 04/22/2015  . CVA (cerebral infarction) 04/22/2015  . Right sided weakness 04/22/2015  . Seizure disorder ([place]) 04/22/2015  . Chronic headaches 04/22/2015  . Atrial fibrillation ([place]) 04/22/2015  . Right arm weakness   . Asthma, mild intermittent   . History of recent fall 03/09/2014  . 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 Obesity, Class III, BMI 40-49.9 (morbid obesity) ([place]) 12/21/2012  . GERD (gastroesophageal reflux disease)   . Incisional hernia, without obstruction or gangrene 11/06/2012  . Status post TAH-BSO 10/09/2003    Past Surgical History:  Procedure Laterality Date  . ABDOMINAL HYSTERECTOMY     wiht bso  . CARDIAC CATHETERIZATION N/A 01/21/2015   Procedure: Left Heart Cath;  Surgeon: Yolonda Kida, MD;  Location: [facility];  Service: Cardiovascular;  Laterality: N/A;  . CESAREAN SECTION    .  COLONOSCOPY N/A 11/21/2012   Procedure: COLONOSCOPY;  Surgeon: Irene Shipper, MD;  [facility];  Service: Endoscopy;  Laterality: N/A;  . DILATION AND CURETTAGE OF UTERUS    . ESOPHAGOGASTRODUODENOSCOPY N/A 11/21/2012   Procedure: ESOPHAGOGASTRODUODENOSCOPY (EGD);  Surgeon: Irene Shipper, MD;  Location: [facility];  Service: Endoscopy;  Laterality: N/A;  . HERNIA REPAIR  12/17/12   laparoscopic incisional   . INCISIONAL HERNIA REPAIR N/A  12/17/2012   Procedure: LAPAROSCOPIC INCISIONAL HERNIA;  Surgeon: Harl Bowie, MD;  Location: [facility];  Service: General;  Laterality: N/A;  . INSERTION OF MESH N/A 12/17/2012   Procedure: INSERTION OF MESH;  Surgeon: Harl Bowie, MD;  Location: [facility];  Service: General;  Laterality: N/A;    OB History    Gravida Para Term Preterm AB Living   6 2 2   4 1    SAB TAB Ectopic Multiple Live Births   4       2       Home Medications    Prior to Admission medications   Medication Sig Start Date End Date Taking? Authorizing Provider  albuterol (PROVENTIL HFA;VENTOLIN HFA) 108 (90 BASE) MCG/ACT inhaler Inhale 2 puffs into the lungs every 6 (six) hours as needed for wheezing.    Historical Provider, MD  clonazePAM (KLONOPIN) 1 MG tablet Take 1 mg by mouth at bedtime as needed (for sleep and if she feels a seizure coming on).    Historical Provider, MD  dihydroergotamine (MIGRANAL) 4 MG/ML nasal spray Place 1 spray into the nose as needed for migraine. Use in one nostril as directed.  No more than 4 sprays in one hour 07/23/16   Larey Seat, MD  DULoxetine (CYMBALTA) 30 MG capsule Take one capsule (30mg ) every morning for 7 days then increase to two capsules (60mg ) thereafter 09/28/16   Historical Provider, MD  estradiol (ESTRACE) 1 MG tablet Take 1 tablet (1 mg total) by mouth daily. 11/09/16   Alanda Slim Defrancesco, MD  gabapentin (NEURONTIN) 300 MG capsule Take by mouth. 09/28/16 09/28/17  Historical Provider, MD  lamoTRIgine (LAMICTAL) 100 MG tablet Take 1 tablet (100 mg total) by mouth 2 (two) times daily. 07/23/16   Asencion Partridge Dohmeier, MD  metFORMIN (GLUCOPHAGE) 500 MG tablet Take 1 tablet (500 mg total) by mouth daily with breakfast. 09/24/16   Caren Drucie Opitz, MD  metoprolol tartrate (LOPRESSOR) 25 MG tablet Take 1 tablet (25 mg total) by mouth 2 (two) times daily. Patient taking differently: Take 25 mg by mouth daily.  04/25/15   Domenic Polite, MD  Prasterone (INTRAROSA) 6.5 MG INST  Place 6.5 mg vaginally at bedtime. 10/24/16   Alanda Slim Defrancesco, MD  promethazine (PHENERGAN) 50 MG tablet Take 0.5 tablets (25 mg total) by mouth every 6 (six) hours as needed for nausea or vomiting. 07/23/16   Asencion Partridge Dohmeier, MD  ranitidine (ZANTAC) 300 MG tablet Take by mouth. 09/11/16 11/10/16  Historical Provider, MD  topiramate (TOPAMAX) 100 MG tablet Take 1 tablet (100 mg total) by mouth 2 (two) times daily. 07/23/16   Asencion Partridge Dohmeier, MD  Vitamin D, Ergocalciferol, (DRISDOL) 50000 units CAPS capsule TAKE 1 CAPSULE BY MOUTH ONCE A WEEK. 08/31/15   Historical Provider, MD    Family History Family History  Problem Relation Age of Onset  . Hypertension Mother   . Depression Mother   . Heart disease Mother     deceased 06-Feb-2014  . Heart attack Father   . Hypertension Father   .  Emphysema Father   . COPD Father   . Heart disease Father   . Diabetes Father   . Breast cancer Cousin   . Cancer Neg Hx   . Colon cancer Neg Hx   . Esophageal cancer Neg Hx   . Stomach cancer Neg Hx   . Pancreatic cancer Neg Hx   . Liver disease Neg Hx     Social History Social History  Substance Use Topics  . Smoking status: Never Smoker  . Smokeless tobacco: Never Used  . Alcohol use 0.0 oz/week     Comment: rare     Allergies   Amoxicillin; Biaxin [clarithromycin]; Erythromycin base; Penicillins; and Zithromax [azithromycin]   Review of Systems Review of Systems  Constitutional: Negative for fever.  Genitourinary: Negative for dysuria.  All other systems reviewed and are negative.    Physical Exam Updated Vital Signs BP (!) 141/87 (BP Location: Left Arm)   Pulse 89   Temp 98.6 F (37 C) (Oral)   Resp 20   Ht 5' 2.5" (1.588 m)   Wt 272 lb (123.4 kg)   LMP  (LMP Unknown) Comment: 2005  SpO2 98%   BMI 48.96 kg/m   Physical Exam  Constitutional: She is oriented to person, place, and time. She appears well-developed and well-nourished. No distress.  HENT:  Head: Normocephalic  and atraumatic.  Neck: Normal range of motion. Neck supple.  Cardiovascular: Normal rate and regular rhythm.  Exam reveals no gallop and no friction rub.   No murmur heard. Pulmonary/Chest: Effort normal and breath sounds normal. No respiratory distress. She has no wheezes.  Abdominal: Soft. Bowel sounds are normal. She exhibits no distension. There is tenderness. There is no rebound and no guarding.  There is tenderness in all 4 quadrants, however most notable in the right upper and left lower quadrants.  Musculoskeletal: Normal range of motion.  Neurological: She is alert and oriented to person, place, and time.  Skin: Skin is warm and dry. She is not diaphoretic.  Nursing note and vitals reviewed.    ED Treatments / Results  Labs (all labs ordered are listed, but only abnormal results are displayed) Labs Reviewed  COMPREHENSIVE METABOLIC PANEL - Abnormal; Notable for the following:       Result Value   CO2 21 (*)    Glucose, Bld 104 (*)    All other components within normal limits  URINALYSIS, ROUTINE W REFLEX MICROSCOPIC - Abnormal; Notable for the following:    Hgb urine dipstick SMALL (*)    Squamous Epithelial / LPF 0-5 (*)    All other components within normal limits  LIPASE, BLOOD  CBC  POC URINE PREG, ED    EKG  EKG Interpretation None       Radiology No results found.  Procedures Procedures (including critical care time)  Medications Ordered in ED Medications  sodium chloride 0.9 % bolus 1,000 mL (not administered)  morphine 4 MG/ML injection 4 mg (not administered)  ondansetron (ZOFRAN) injection 4 mg (not administered)  ondansetron (ZOFRAN-ODT) disintegrating tablet 4 mg (4 mg Oral Given 11/23/16 2032)     Initial Impression / Assessment and Plan / ED Course  I have reviewed the triage vital signs and the nursing notes.  Pertinent labs & imaging results that were available during my care of the patient were reviewed by me and considered in my  medical decision making (see chart for details).  Patient presents here with complaints of abdominal pain and vomiting. Her abdomen  is tender in all four quadrants. Laboratory studies are reassuring and CT scan of the abdomen and pelvis is negative. She was given fluids and anti-emetics but continues to feel nauseated. She was given another dose of Phenergan and discharged.  Final Clinical Impressions(s) / ED Diagnoses   Final diagnoses:  None    New Prescriptions New Prescriptions   No medications on file     Veryl Speak, MD 11/24/16 1526

## 2016-11-23 NOTE — ED Notes (Signed)
ED Provider at bedside. 

## 2016-11-23 NOTE — ED Notes (Signed)
Pt presents today with "severe" abd pain, nausea and vomiting. Pt reports 2 episodes of vomiting. Pt states nothing relieves her nausea and smells of food make it worse.

## 2016-11-23 NOTE — ED Notes (Signed)
Patient transported to X-ray 

## 2016-11-23 NOTE — ED Triage Notes (Signed)
Pt complaining of abdominal pain, nausea, vomiting and diarrhea. Pt states ongoing x 2 days. Pt states 4 episodes of emesis today. Pt unsure how many episods of diarrhea. Pt denies any urinary symptoms.

## 2016-11-23 NOTE — Discharge Instructions (Signed)
Zofran as prescribed as needed for nausea.  Hydrocodone as prescribed as needed for pain.  Follow-up with your primary Dr. if not improving in the next several days, and return to the ER if you develop worsening pain, high fevers, bloody stools, or other new and concerning symptoms.

## 2016-11-26 ENCOUNTER — Ambulatory Visit (INDEPENDENT_AMBULATORY_CARE_PROVIDER_SITE_OTHER): Payer: BC Managed Care – PPO | Admitting: Family Medicine

## 2016-11-26 VITALS — BP 109/70 | HR 84 | Temp 98.7°F | Ht 62.0 in | Wt 274.0 lb

## 2016-11-26 DIAGNOSIS — R7303 Prediabetes: Secondary | ICD-10-CM | POA: Diagnosis not present

## 2016-11-26 DIAGNOSIS — E559 Vitamin D deficiency, unspecified: Secondary | ICD-10-CM

## 2016-11-26 MED ORDER — VITAMIN D (ERGOCALCIFEROL) 1.25 MG (50000 UNIT) PO CAPS: 50000.0000 [IU] | ORAL_CAPSULE | ORAL | 0 refills | Status: AC

## 2016-11-26 MED ORDER — METFORMIN HCL 500 MG PO TABS: 500.0000 mg | ORAL_TABLET | Freq: Every day | ORAL | 0 refills | Status: DC

## 2016-11-26 NOTE — Progress Notes (Signed)
Office: [phone]  /  Fax: [phone]   HPI:   Chief Complaint: OBESITY [name] [name] is here to discuss her progress with her obesity treatment plan. She is following her eating plan approximately 10 % of the time and states she is walking 2 times per week. [name] [name] continues to do well with weight loss but hasn't followed category 2 plan much, mostly portion control/smart choices and decreased appetite with recent GI upset and vertigo exacerbation. She would like more freedom for dinner. Her weight is 274 lb (124.3 kg) today and has had a weight loss of 4 pounds over a period of 9 weeks since her last visit. She has lost 2 lbs since starting treatment with [name].  Vitamin D deficiency [name] [name] has a diagnosis of vitamin D deficiency. She is currently on vit D, not yet at goal. She hasn't been taking vitamin D and level not improving. She still notes fatigue and denies nausea or muscle weakness.  Pre-Diabetes [name] [name] has a diagnosis of prediabetes based on her elevated Hgb A1c and was informed this puts her at greater risk of developing diabetes. She is taking metformin currently, stopped while sick but feeling better now. She continues to work on diet and exercise to decrease risk of diabetes. She denies nausea, vomiting, diarrhea  or hypoglycemia.   Wt Readings from Last 500 Encounters:  11/26/16 274 lb (124.3 kg)  11/23/16 272 lb (123.4 kg)  10/29/16 282 lb (127.9 kg)  10/09/16 280 lb 9.6 oz (127.3 kg)  09/24/16 278 lb (126.1 kg)  09/10/16 276 lb (125.2 kg)  07/23/16 283 lb (128.4 kg)  06/28/16 281 lb (127.5 kg)  05/14/16 268 lb (121.6 kg)  02/24/16 268 lb (121.6 kg)  02/17/16 273 lb (123.8 kg)  01/18/16 278 lb (126.1 kg)  01/04/16 274 lb 6.4 oz (124.5 kg)  11/30/15 262 lb (118.8 kg)  10/06/15 270 lb 8 oz (122.7 kg)  09/08/15 264 lb 11.2 oz (120.1 kg)  09/07/15 260 lb (117.9 kg)  07/20/15 262 lb (118.8 kg)  07/19/15 270 lb 6.4 oz (122.7 kg)  05/23/15 262 lb (118.8 kg)  04/27/15  270 lb (122.5 kg)  04/25/15 268 lb 8 oz (121.8 kg)  04/15/15 268 lb (121.6 kg)  04/06/15 259 lb (117.5 kg)  03/11/15 278 lb (126.1 kg)  03/02/15 275 lb (124.7 kg)  02/02/15 281 lb (127.5 kg)  01/21/15 283 lb (128.4 kg)  09/05/14 275 lb (124.7 kg)  08/17/14 278 lb 3.2 oz (126.2 kg)  06/09/14 282 lb (127.9 kg)  03/09/14 275 lb (124.7 kg)  02/03/14 263 lb (119.3 kg)  06/22/13 270 lb (122.5 kg)  06/09/13 274 lb (124.3 kg)  05/13/13 275 lb (124.7 kg)  01/30/13 265 lb (120.2 kg)  01/02/13 264 lb (119.7 kg)  12/17/12 272 lb 0.8 oz (123.4 kg)  12/10/12 272 lb 0.8 oz (123.4 kg)  11/25/12 268 lb (121.6 kg)  11/20/12 267 lb (121.1 kg)  11/18/12 273 lb 4 oz (123.9 kg)  11/06/12 274 lb (124.3 kg)  08/07/12 268 lb (121.6 kg)  05/15/12 262 lb (118.8 kg)  07/21/11 261 lb (118.4 kg)  04/21/11 268 lb (121.6 kg)     ALLERGIES: Allergies  Allergen Reactions  . Amoxicillin Hives and Swelling  . Biaxin [Clarithromycin] Hives  . Erythromycin Base Hives  . Penicillins Hives and Swelling    Has patient had a PCN reaction causing immediate rash, facial/tongue/throat swelling, SOB or lightheadedness with hypotension: Yes Has patient had a PCN reaction causing severe rash  involving mucus membranes or skin necrosis: No Has patient had a PCN reaction that required hospitalization No Has patient had a PCN reaction occurring within the last 10 years: No If all of the above answers are "NO", then may proceed with Cephalosporin use.  [name] [name] Zithromax [Azithromycin] Hives and Nausea Only    MEDICATIONS: Current Outpatient Prescriptions on File Prior to Visit  Medication Sig Dispense Refill  . albuterol (PROVENTIL HFA;VENTOLIN HFA) 108 (90 BASE) MCG/ACT inhaler Inhale 2 puffs into the lungs every 6 (six) hours as needed for wheezing.    [name] [name] albuterol (PROVENTIL) (2.5 MG/3ML) 0.083% nebulizer solution Take 2.5 mg by nebulization every 6 (six) hours as needed for wheezing or shortness of breath.    .  clonazePAM (KLONOPIN) 1 MG tablet Take 1 mg by mouth daily as needed (onset of seizure).     [name] [name] dihydroergotamine (MIGRANAL) 4 MG/ML nasal spray Place 1 spray into the nose as needed for migraine. Use in one nostril as directed.  No more than 4 sprays in one hour (Patient taking differently: Place 1 spray into the nose See admin instructions. Instil 1 spray into one nostril up to 4 times as needed for migraines (no more than 4 sprays in one hour)) 8 mL 12  . DULoxetine (CYMBALTA) 30 MG capsule Take 60 mg by mouth daily.    [name] [name] estradiol (ESTRACE) 1 MG tablet Take 1 tablet (1 mg total) by mouth daily. 30 tablet 1  . gabapentin (NEURONTIN) 300 MG capsule Take 300 mg by mouth at bedtime.     [name] [name] HYDROcodone-acetaminophen (NORCO) 5-325 MG tablet Take 1-2 tablets by mouth every 6 (six) hours as needed. 15 tablet 0  . lamoTRIgine (LAMICTAL) 100 MG tablet Take 1 tablet (100 mg total) by mouth 2 (two) times daily. 180 tablet 0  . meclizine (ANTIVERT) 25 MG tablet Take 25 mg by mouth 3 (three) times daily as needed for dizziness.   1  . metoprolol tartrate (LOPRESSOR) 25 MG tablet Take 1 tablet (25 mg total) by mouth 2 (two) times daily. (Patient taking differently: Take 25 mg by mouth daily. ) 60 tablet 0  . naproxen sodium (ALEVE) 220 MG tablet Take 440 mg by mouth daily as needed (pain).    . ondansetron (ZOFRAN) 8 MG tablet Take 1 tablet (8 mg total) by mouth every 8 (eight) hours as needed for nausea. 6 tablet 0  . Prasterone (INTRAROSA) 6.5 MG INST Place 6.5 mg vaginally at bedtime. 30 each 6  . promethazine (PHENERGAN) 50 MG tablet Take 0.5 tablets (25 mg total) by mouth every 6 (six) hours as needed for nausea or vomiting. 30 tablet 0  . ranitidine (ZANTAC) 300 MG tablet Take 300 mg by mouth daily as needed for heartburn (acid reflux).     . topiramate (TOPAMAX) 100 MG tablet Take 1 tablet (100 mg total) by mouth 2 (two) times daily. 60 tablet 11   Current Facility-Administered Medications on File Prior to  Visit  Medication Dose Route Frequency Provider Last Rate Last Dose  . fentaNYL (SUBLIMAZE) injection 50 mcg  50 mcg Intravenous UD Lance Bosch, MD        PAST MEDICAL HISTORY: Past Medical History:  Diagnosis Date  . Anginal pain ([place])   . Anxiety   . Asthma   . Atrial fibrillation ([place])   . Back pain   . Back pain    bulging disk  . Chest pain   . Epilepsy ([place])   . Fibroid   .  GERD (gastroesophageal reflux disease)   . Head injury   . Joint pain   . Migraine   . Obesity, Class III, BMI 40-49.9 (morbid obesity) ([place]) 12/21/2012  . Ovarian cyst   . Prediabetes   . Seizures ([place])   . Shortness of breath dyspnea   . Surgical menopause   . TBI (traumatic brain injury) ([place])   . TIA (transient ischemic attack)   . Vitamin D deficiency     PAST SURGICAL HISTORY: Past Surgical History:  Procedure Laterality Date  . ABDOMINAL HYSTERECTOMY     wiht bso  . CARDIAC CATHETERIZATION N/A 01/21/2015   Procedure: Left Heart Cath;  Surgeon: Yolonda Kida, MD;  Location: [facility];  Service: Cardiovascular;  Laterality: N/A;  . CESAREAN SECTION    . COLONOSCOPY N/A 11/21/2012   Procedure: COLONOSCOPY;  Surgeon: Irene Shipper, MD;  Location: [facility];  Service: Endoscopy;  Laterality: N/A;  . DILATION AND CURETTAGE OF UTERUS    . ESOPHAGOGASTRODUODENOSCOPY N/A 11/21/2012   Procedure: ESOPHAGOGASTRODUODENOSCOPY (EGD);  Surgeon: Irene Shipper, MD;  Location: [facility];  Service: Endoscopy;  Laterality: N/A;  . HERNIA REPAIR  12/17/12   laparoscopic incisional   . INCISIONAL HERNIA REPAIR N/A 12/17/2012   Procedure: LAPAROSCOPIC INCISIONAL HERNIA;  Surgeon: Harl Bowie, MD;  Location: [facility];  Service: General;  Laterality: N/A;  . INSERTION OF MESH N/A 12/17/2012   Procedure: INSERTION OF MESH;  Surgeon: Harl Bowie, MD;  Location: [facility];  Service: General;  Laterality: N/A;    SOCIAL HISTORY: Social History  Substance Use Topics  . Smoking status:  Never Smoker  . Smokeless tobacco: Never Used  . Alcohol use 0.0 oz/week     Comment: rare    FAMILY HISTORY: Family History  Problem Relation Age of Onset  . Hypertension Mother   . Depression Mother   . Heart disease Mother     deceased 2014/02/22  . Heart attack Father   . Hypertension Father   . Emphysema Father   . COPD Father   . Heart disease Father   . Diabetes Father   . Breast cancer Cousin   . Cancer Neg Hx   . Colon cancer Neg Hx   . Esophageal cancer Neg Hx   . Stomach cancer Neg Hx   . Pancreatic cancer Neg Hx   . Liver disease Neg Hx     ROS: Review of Systems  Constitutional: Positive for malaise/fatigue.  Gastrointestinal: Negative for diarrhea, nausea and vomiting.  Musculoskeletal:       Negative muscle weakness  Endo/Heme/Allergies:       Negative hypoglycemia    PHYSICAL EXAM: Blood pressure 109/70, pulse 84, temperature 98.7 F (37.1 C), temperature source Oral, height 5\' 2"  (1.575 m), weight 274 lb (124.3 kg), SpO2 97 %. Body mass index is 50.12 kg/m. Physical Exam  Constitutional: She is oriented to person, place, and time. She appears well-developed and well-nourished.  Cardiovascular: Normal rate.   Pulmonary/Chest: Effort normal.  Musculoskeletal: Normal range of motion.  Neurological: She is oriented to person, place, and time.  Skin: Skin is warm and dry.  Psychiatric: She has a normal mood and affect. Her behavior is normal.  Vitals reviewed.   RECENT LABS AND TESTS: BMET    Component Value Date/Time   NA 139 11/23/2016 2030   NA 144 10/29/2016 1637   NA 140 12/01/2014 1109   K 4.1 11/23/2016 2030   K 3.8 12/01/2014 1109  CL 110 11/23/2016 2030   CL 111 12/01/2014 1109   CO2 21 (L) 11/23/2016 2030   CO2 23 12/01/2014 1109   GLUCOSE 104 (H) 11/23/2016 2030   GLUCOSE 99 12/01/2014 1109   BUN 14 11/23/2016 2030   BUN 13 10/29/2016 1637   BUN 17 12/01/2014 1109   CREATININE 0.78 11/23/2016 2030   CREATININE 0.83  12/01/2014 1109   CREATININE 0.82 06/22/2013 1152   CALCIUM 9.1 11/23/2016 2030   CALCIUM 8.7 (L) 12/01/2014 1109   GFRNONAA >60 11/23/2016 2030   GFRNONAA >60 12/01/2014 1109   GFRAA >60 11/23/2016 2030   GFRAA >60 12/01/2014 1109   Lab Results  Component Value Date   HGBA1C 5.5 10/09/2016   HGBA1C 5.9 (H) 09/10/2016   HGBA1C 5.6 10/12/2015   HGBA1C 5.9 (H) 04/23/2015   HGBA1C 5.9 (H) 03/08/2015   Lab Results  Component Value Date   INSULIN 36.3 (H) 09/10/2016   CBC    Component Value Date/Time   WBC 7.0 11/23/2016 2030   RBC 5.05 11/23/2016 2030   HGB 13.6 11/23/2016 2030   HGB 13.0 12/01/2014 1109   HCT 41.0 11/23/2016 2030   HCT 38.5 10/29/2016 1637   PLT 172 11/23/2016 2030   PLT 196 10/29/2016 1637   MCV 81.2 11/23/2016 2030   MCV 83 10/29/2016 1637   MCV 83 12/01/2014 1109   MCH 26.9 11/23/2016 2030   MCHC 33.2 11/23/2016 2030   RDW 15.1 11/23/2016 2030   RDW 16.3 (H) 10/29/2016 1637   RDW 15.0 (H) 12/01/2014 1109   LYMPHSABS 3.2 (H) 10/29/2016 1637   MONOABS 0.5 02/05/2016 1409   EOSABS 0.1 10/29/2016 1637   BASOSABS 0.0 10/29/2016 1637   Iron/TIBC/Ferritin/ %Sat No results found for: IRON, TIBC, FERRITIN, IRONPCTSAT Lipid Panel     Component Value Date/Time   CHOL 130 10/09/2016 0924   CHOL 141 09/27/2014 0601   TRIG 84 10/09/2016 0924   TRIG 64 09/27/2014 0601   HDL 41 10/09/2016 0924   HDL 44 09/27/2014 0601   CHOLHDL 3.2 10/09/2016 0924   CHOLHDL 3.4 04/23/2015 0414   VLDL 11 04/23/2015 0414   VLDL 13 09/27/2014 0601   LDLCALC 72 10/09/2016 0924   LDLCALC 84 09/27/2014 0601   Hepatic Function Panel     Component Value Date/Time   PROT 7.4 11/23/2016 2030   PROT 7.0 10/29/2016 1637   PROT 7.7 09/26/2014 1837   ALBUMIN 3.9 11/23/2016 2030   ALBUMIN 3.9 10/29/2016 1637   ALBUMIN 3.5 09/26/2014 1837   AST 16 11/23/2016 2030   AST 17 09/26/2014 1837   ALT 18 11/23/2016 2030   ALT 24 09/26/2014 1837   ALKPHOS 104 11/23/2016 2030    ALKPHOS 116 09/26/2014 1837   BILITOT 0.6 11/23/2016 2030   BILITOT 0.2 10/29/2016 1637   BILITOT 0.2 09/26/2014 1837   BILIDIR <0.1 11/18/2012 1313   IBILI NOT CALCULATED 11/18/2012 1313      Component Value Date/Time   TSH 1.970 10/09/2016 0924   TSH 0.344 (L) 09/10/2016 1146   TSH 0.736 10/12/2015 1010    ASSESSMENT AND PLAN: Prediabetes - Plan: metFORMIN (GLUCOPHAGE) 500 MG tablet  Vitamin D deficiency - Plan: Vitamin D, Ergocalciferol, (DRISDOL) 50000 units CAPS capsule  Morbid obesity ([place])  PLAN:  Pre-Diabetes Zykera will continue to work on weight loss, exercise, and decreasing simple carbohydrates in her diet to help decrease the risk of diabetes. We dicussed metformin including benefits and risks. She was informed that eating too  many simple carbohydrates or too many calories at one sitting increases the likelihood of GI side effects. Kolette requested metformin for now and a prescription was written today for 1 month refill. Zikeria agreed to follow up with [name] as directed to monitor her progress.  Vitamin D Deficiency Leyna was informed that low vitamin D levels contributes to fatigue and are associated with obesity, breast, and colon cancer. She agrees to continue to take prescription Vit D @50 ,000 IU every week, we will refill for 1 month and will follow up for routine testing of vitamin D, at least 2-3 times per year. She was informed of the risk of over-replacement of vitamin D and agrees to not increase her dose unless he discusses this with [name] first. She agrees to follow up with our clinic in 2 weeks.  Obesity Fatin is currently in the action stage of change. As such, her goal is to continue with weight loss efforts She has agreed to keep a food journal with 350 to 500 calories and 30+ grams of  protein at supper daily and follow the Category 2 plan Ludie has been instructed to work up to a goal of 150 minutes of combined cardio and strengthening exercise per week  for weight loss and overall health benefits. We discussed the following Behavioral Modification Stratagies today: increasing water, increasing lean protein intake and work on meal planning and easy cooking plans  Aleyda has agreed to follow up with our clinic in 2 weeks. She was informed of the importance of frequent follow up visits to maximize her success with intensive lifestyle modifications for her multiple health conditions.  I, Doreene Nest, am acting as scribe for Dennard Nip, MD  I have reviewed the above documentation for accuracy and completeness, and I agree with the above. -Dennard Nip, MD

## 2016-11-27 ENCOUNTER — Encounter (HOSPITAL_COMMUNITY): Payer: Self-pay

## 2016-11-27 ENCOUNTER — Emergency Department (HOSPITAL_COMMUNITY)
Admission: EM | Admit: 2016-11-27 | Discharge: 2016-11-27 | Disposition: A | Discharge status: Home / Self Care | Payer: BC Managed Care – PPO | Source: Home / Self Care | Attending: Emergency Medicine | Admitting: Emergency Medicine

## 2016-11-27 ENCOUNTER — Telehealth: Payer: Self-pay | Admitting: Adult Health

## 2016-11-27 ENCOUNTER — Encounter: Payer: Self-pay | Admitting: Nurse Practitioner

## 2016-11-27 ENCOUNTER — Ambulatory Visit (INDEPENDENT_AMBULATORY_CARE_PROVIDER_SITE_OTHER): Payer: BC Managed Care – PPO | Admitting: Nurse Practitioner

## 2016-11-27 ENCOUNTER — Telehealth: Payer: Self-pay | Admitting: Nurse Practitioner

## 2016-11-27 ENCOUNTER — Emergency Department (HOSPITAL_COMMUNITY): Payer: BC Managed Care – PPO

## 2016-11-27 VITALS — BP 120/75 | HR 65 | Wt 278.4 lb

## 2016-11-27 DIAGNOSIS — G40909 Epilepsy, unspecified, not intractable, without status epilepticus: Secondary | ICD-10-CM | POA: Diagnosis not present

## 2016-11-27 DIAGNOSIS — Z79899 Other long term (current) drug therapy: Secondary | ICD-10-CM

## 2016-11-27 DIAGNOSIS — G40309 Generalized idiopathic epilepsy and epileptic syndromes, not intractable, without status epilepticus: Secondary | ICD-10-CM

## 2016-11-27 DIAGNOSIS — G43101 Migraine with aura, not intractable, with status migrainosus: Secondary | ICD-10-CM

## 2016-11-27 DIAGNOSIS — J45909 Unspecified asthma, uncomplicated: Secondary | ICD-10-CM | POA: Insufficient documentation

## 2016-11-27 DIAGNOSIS — Z8673 Personal history of transient ischemic attack (TIA), and cerebral infarction without residual deficits: Secondary | ICD-10-CM

## 2016-11-27 DIAGNOSIS — F445 Conversion disorder with seizures or convulsions: Secondary | ICD-10-CM | POA: Diagnosis not present

## 2016-11-27 LAB — I-STAT CHEM 8, ED
BUN: 10 mg/dL (ref 6–20)
CHLORIDE: 112 mmol/L — AB (ref 101–111)
Calcium, Ion: 1.1 mmol/L — ABNORMAL LOW (ref 1.15–1.40)
Creatinine, Ser: 1 mg/dL (ref 0.44–1.00)
Glucose, Bld: 82 mg/dL (ref 65–99)
HEMATOCRIT: 37 % (ref 36.0–46.0)
Hemoglobin: 12.6 g/dL (ref 12.0–15.0)
POTASSIUM: 3.6 mmol/L (ref 3.5–5.1)
SODIUM: 144 mmol/L (ref 135–145)
TCO2: 20 mmol/L (ref 0–100)

## 2016-11-27 LAB — CBC WITH DIFFERENTIAL/PLATELET
BASOS PCT: 1 %
Basophils Absolute: 0 10*3/uL (ref 0.0–0.1)
Eosinophils Absolute: 0 10*3/uL (ref 0.0–0.7)
Eosinophils Relative: 1 %
HEMATOCRIT: 37.3 % (ref 36.0–46.0)
HEMOGLOBIN: 12.1 g/dL (ref 12.0–15.0)
LYMPHS ABS: 2.5 10*3/uL (ref 0.7–4.0)
Lymphocytes Relative: 47 %
MCH: 26.3 pg (ref 26.0–34.0)
MCHC: 32.4 g/dL (ref 30.0–36.0)
MCV: 81.1 fL (ref 78.0–100.0)
MONO ABS: 0.3 10*3/uL (ref 0.1–1.0)
MONOS PCT: 6 %
NEUTROS ABS: 2.4 10*3/uL (ref 1.7–7.7)
NEUTROS PCT: 45 %
Platelets: 164 10*3/uL (ref 150–400)
RBC: 4.6 MIL/uL (ref 3.87–5.11)
RDW: 14.9 % (ref 11.5–15.5)
WBC: 5.2 10*3/uL (ref 4.0–10.5)

## 2016-11-27 LAB — COMPREHENSIVE METABOLIC PANEL
ALK PHOS: 96 U/L (ref 38–126)
ALT: 21 U/L (ref 14–54)
ANION GAP: 9 (ref 5–15)
AST: 18 U/L (ref 15–41)
Albumin: 3.5 g/dL (ref 3.5–5.0)
BUN: 11 mg/dL (ref 6–20)
CO2: 20 mmol/L — ABNORMAL LOW (ref 22–32)
Calcium: 8.5 mg/dL — ABNORMAL LOW (ref 8.9–10.3)
Chloride: 111 mmol/L (ref 101–111)
Creatinine, Ser: 0.9 mg/dL (ref 0.44–1.00)
GFR calc non Af Amer: >60 mL/min (ref >60)
Glucose, Bld: 83 mg/dL (ref 65–99)
POTASSIUM: 3.7 mmol/L (ref 3.5–5.1)
Sodium: 140 mmol/L (ref 135–145)
TOTAL PROTEIN: 6.6 g/dL (ref 6.5–8.1)
Total Bilirubin: 0.3 mg/dL (ref 0.3–1.2)

## 2016-11-27 MED ORDER — LORAZEPAM 2 MG/ML IJ SOLN
INTRAMUSCULAR | Status: AC
  Filled 2016-11-27: qty 1

## 2016-11-27 MED ORDER — LORAZEPAM 2 MG/ML IJ SOLN
INTRAMUSCULAR | Status: AC
  Administered 2016-11-27: 2 mg
  Filled 2016-11-27: qty 1

## 2016-11-27 MED ORDER — SODIUM CHLORIDE 0.9 % IV SOLN
1000.0000 mg | Freq: Once | INTRAVENOUS | Status: AC
  Administered 2016-11-27: 1000 mg via INTRAVENOUS
  Filled 2016-11-27: qty 10

## 2016-11-27 MED ORDER — LAMOTRIGINE 150 MG PO TABS: 150.0000 mg | ORAL_TABLET | Freq: Two times a day (BID) | ORAL | 1 refills | Status: DC

## 2016-11-27 MED ORDER — DIPHENHYDRAMINE HCL 50 MG/ML IJ SOLN
25.0000 mg | Freq: Once | INTRAMUSCULAR | Status: AC
  Administered 2016-11-27: 25 mg via INTRAVENOUS
  Filled 2016-11-27: qty 1

## 2016-11-27 MED ORDER — METOCLOPRAMIDE HCL 5 MG/ML IJ SOLN
10.0000 mg | Freq: Once | INTRAMUSCULAR | Status: AC
  Administered 2016-11-27: 10 mg via INTRAVENOUS
  Filled 2016-11-27: qty 2

## 2016-11-27 MED ORDER — SODIUM CHLORIDE 0.9 % IV BOLUS (SEPSIS)
1000.0000 mL | Freq: Once | INTRAVENOUS | Status: AC
  Administered 2016-11-27: 1000 mL via INTRAVENOUS

## 2016-11-27 NOTE — Telephone Encounter (Signed)
I spoke to [place], NP and she is agreeable to seeing this pt at 10:45am. I called pt. She is also agreeable to this and will arrive by 10:45am.

## 2016-11-27 NOTE — Patient Instructions (Signed)
Increase Lamictal to 150mg  twice daily EEG Continue other meds May think about sleep study in the future last in 2016 Follow up in 2 months with [facility]

## 2016-11-27 NOTE — Consult Note (Signed)
NEURO HOSPITALIST CONSULT NOTE   Requestig physician: Dr. Darl Householder   Reason for Consult: possible seizure   History obtained from:  Patient   Chart   HPI:                                                                                                                                          [name] [name] is an 51 y.o. female with known seizure history. Patient was seen by her nurse practitioner at her primary neurologist office. While she was there she had explained that she had had an increase in seizure activity over the last 2 weeks. 2 recent seizures that occurred during her sleep. Husband says that it wakes her as she says she is moving her extremities and will come up. She claims that she is having increased seizure activity during the day. While in the office apparently she had 2 staring spells and was sent to the emergency department for further evaluation. Due to the increase in seizure activity the plan was to increase patient's Lamictal to 150 twice a day as she was on 100 twice a day prior.  During my consultation in the ED patient had a episode in which she was flailing her arms independently as if she was swimming shaking her head from side to side throwing her legs again and a kicking-like motion and flailing. I quickly took a ammonia tablet out of my pocket. Under patient's nose and she would quickly try to avoid the ammonia tablet to the point where she actually sat up at the bed and tried to get out of the bed and put her feet on the floor. She also had a episode very much like this prior to me coming into the room apparently with the nurse and was talking through the whole episode.  Past Medical History:  Diagnosis Date  . Anginal pain ([place])   . Anxiety   . Asthma   . Atrial fibrillation ([place])   . Back pain   . Back pain    bulging disk  . Chest pain   . Epilepsy ([place])   . Fibroid   . GERD (gastroesophageal reflux disease)   . Head injury   . Joint  pain   . Migraine   . Obesity, Class III, BMI 40-49.9 (morbid obesity) ([place]) 12/21/2012  . Ovarian cyst   . Prediabetes   . Seizures ([place])   . Shortness of breath dyspnea   . Surgical menopause   . TBI (traumatic brain injury) ([place])   . TIA (transient ischemic attack)   . Vitamin D deficiency     Past Surgical History:  Procedure Laterality Date  . ABDOMINAL HYSTERECTOMY     wiht bso  . CARDIAC CATHETERIZATION N/A 01/21/2015   Procedure: Left  Heart Cath;  Surgeon: Yolonda Kida, MD;  Location: [facility];  Service: Cardiovascular;  Laterality: N/A;  . CESAREAN SECTION    . COLONOSCOPY N/A 11/21/2012   Procedure: COLONOSCOPY;  Surgeon: Irene Shipper, MD;  Location: [facility];  Service: Endoscopy;  Laterality: N/A;  . DILATION AND CURETTAGE OF UTERUS    . ESOPHAGOGASTRODUODENOSCOPY N/A 11/21/2012   Procedure: ESOPHAGOGASTRODUODENOSCOPY (EGD);  Surgeon: Irene Shipper, MD;  Location: [facility];  Service: Endoscopy;  Laterality: N/A;  . HERNIA REPAIR  12/17/12   laparoscopic incisional   . INCISIONAL HERNIA REPAIR N/A 12/17/2012   Procedure: LAPAROSCOPIC INCISIONAL HERNIA;  Surgeon: Harl Bowie, MD;  Location: [facility];  Service: General;  Laterality: N/A;  . INSERTION OF MESH N/A 12/17/2012   Procedure: INSERTION OF MESH;  Surgeon: Harl Bowie, MD;  Location: [facility];  Service: General;  Laterality: N/A;    Family History  Problem Relation Age of Onset  . Hypertension Mother   . Depression Mother   . Heart disease Mother     deceased 02/20/2014  . Heart attack Father   . Hypertension Father   . Emphysema Father   . COPD Father   . Heart disease Father   . Diabetes Father   . Breast cancer Cousin   . Cancer Neg Hx   . Colon cancer Neg Hx   . Esophageal cancer Neg Hx   . Stomach cancer Neg Hx   . Pancreatic cancer Neg Hx   . Liver disease Neg Hx      Social History:  reports that she has never smoked. She has never used smokeless tobacco. She reports  that she drinks alcohol. She reports that she does not use drugs.  Allergies  Allergen Reactions  . Amoxicillin Hives and Swelling  . Biaxin [Clarithromycin] Hives  . Erythromycin Base Hives  . Penicillins Hives and Swelling    Has patient had a PCN reaction causing immediate rash, facial/tongue/throat swelling, SOB or lightheadedness with hypotension: Yes Has patient had a PCN reaction causing severe rash involving mucus membranes or skin necrosis: No Has patient had a PCN reaction that required hospitalization No Has patient had a PCN reaction occurring within the last 10 years: No If all of the above answers are "NO", then may proceed with Cephalosporin use.  . Zithromax [Azithromycin] Hives and Nausea Only    MEDICATIONS:                                                                                                                     Current Facility-Administered Medications  Medication Dose Route Frequency Provider Last Rate Last Dose  . fentaNYL (SUBLIMAZE) injection 50 mcg  50 mcg Intravenous UD Lance Bosch, MD      . levETIRAcetam (KEPPRA) 1,000 mg in sodium chloride 0.9 % 100 mL IVPB  1,000 mg Intravenous Once Drenda Freeze, MD 440 mL/hr at 11/27/16 1448 1,000 mg at 11/27/16 1448   Current Outpatient Prescriptions  Medication  Sig Dispense Refill  . albuterol (PROVENTIL HFA;VENTOLIN HFA) 108 (90 BASE) MCG/ACT inhaler Inhale 2 puffs into the lungs every 6 (six) hours as needed for wheezing.    Marland Kitchen albuterol (PROVENTIL) (2.5 MG/3ML) 0.083% nebulizer solution Take 2.5 mg by nebulization every 6 (six) hours as needed for wheezing or shortness of breath.    . clonazePAM (KLONOPIN) 1 MG tablet Take 1 mg by mouth daily as needed (onset of seizure).     Marland Kitchen dihydroergotamine (MIGRANAL) 4 MG/ML nasal spray Place 1 spray into the nose as needed for migraine. Use in one nostril as directed.  No more than 4 sprays in one hour (Patient taking differently: Place 1 spray into the nose See  admin instructions. Instil 1 spray into one nostril up to 4 times as needed for migraines (no more than 4 sprays in one hour)) 8 mL 12  . DULoxetine (CYMBALTA) 30 MG capsule Take 60 mg by mouth daily.    Marland Kitchen estradiol (ESTRACE) 1 MG tablet Take 1 tablet (1 mg total) by mouth daily. 30 tablet 1  . gabapentin (NEURONTIN) 300 MG capsule Take 300 mg by mouth at bedtime.     Marland Kitchen HYDROcodone-acetaminophen (NORCO) 5-325 MG tablet Take 1-2 tablets by mouth every 6 (six) hours as needed. 15 tablet 0  . lamoTRIgine (LAMICTAL) 150 MG tablet Take 1 tablet (150 mg total) by mouth 2 (two) times daily. 180 tablet 1  . meclizine (ANTIVERT) 25 MG tablet Take 25 mg by mouth 3 (three) times daily as needed for dizziness.   1  . metFORMIN (GLUCOPHAGE) 500 MG tablet Take 1 tablet (500 mg total) by mouth daily with breakfast. 30 tablet 0  . metoprolol tartrate (LOPRESSOR) 25 MG tablet Take 1 tablet (25 mg total) by mouth 2 (two) times daily. (Patient taking differently: Take 25 mg by mouth daily. ) 60 tablet 0  . naproxen sodium (ALEVE) 220 MG tablet Take 440 mg by mouth daily as needed (pain).    . ondansetron (ZOFRAN) 8 MG tablet Take 1 tablet (8 mg total) by mouth every 8 (eight) hours as needed for nausea. 6 tablet 0  . Prasterone (INTRAROSA) 6.5 MG INST Place 6.5 mg vaginally at bedtime. (Patient not taking: Reported on 11/27/2016) 30 each 6  . promethazine (PHENERGAN) 50 MG tablet Take 0.5 tablets (25 mg total) by mouth every 6 (six) hours as needed for nausea or vomiting. 30 tablet 0  . ranitidine (ZANTAC) 300 MG tablet Take 300 mg by mouth daily as needed for heartburn (acid reflux).     . topiramate (TOPAMAX) 100 MG tablet Take 1 tablet (100 mg total) by mouth 2 (two) times daily. 60 tablet 11  . Vitamin D, Ergocalciferol, (DRISDOL) 50000 units CAPS capsule Take 1 capsule (50,000 Units total) by mouth every Tuesday. 30 capsule 0      ROS:  History obtained from the patient  General ROS: negative for - chills, fatigue, fever, night sweats, weight gain or weight loss Psychological ROS: negative for - behavioral disorder, hallucinations, memory difficulties, mood swings or suicidal ideation Ophthalmic ROS: negative for - blurry vision, double vision, eye pain or loss of vision ENT ROS: negative for - epistaxis, nasal discharge, oral lesions, sore throat, tinnitus or vertigo Allergy and Immunology ROS: negative for - hives or itchy/watery eyes Hematological and Lymphatic ROS: negative for - bleeding problems, bruising or swollen lymph nodes Endocrine ROS: negative for - galactorrhea, hair pattern changes, polydipsia/polyuria or temperature intolerance Respiratory ROS: negative for - cough, hemoptysis, shortness of breath or wheezing Cardiovascular ROS: negative for - chest pain, dyspnea on exertion, edema or irregular heartbeat Gastrointestinal ROS: negative for - abdominal pain, diarrhea, hematemesis, nausea/vomiting or stool incontinence Genito-Urinary ROS: negative for - dysuria, hematuria, incontinence or urinary frequency/urgency Musculoskeletal ROS: negative for - joint swelling or muscular weakness Neurological ROS: as noted in HPI Dermatological ROS: negative for rash and skin lesion changes   Blood pressure 122/80, pulse 83, temperature 98.1 F (36.7 C), temperature source Oral, resp. rate 17, SpO2 100 %.   Neurologic Examination:                                                                                                      HEENT-  Normocephalic, no lesions, without obvious abnormality.  Normal external eye and conjunctiva.  Normal TM's bilaterally.  Normal auditory canals and external ears. Normal external nose, mucus membranes and septum.  Normal pharynx. Cardiovascular- S1, S2 normal, pulses palpable throughout   Lungs- chest clear, no wheezing, rales,  normal symmetric air entry Abdomen- normal findings: bowel sounds normal Extremities- no edema Lymph-no adenopathy palpable Musculoskeletal-no joint tenderness, deformity or swelling Skin-warm and dry, no hyperpigmentation, vitiligo, or suspicious lesions  Neurological Examination Mental Status: Alert, oriented, thought content appropriate.  Speech fluent without evidence of aphasia.  Able to follow 3 step commands without difficulty. Cranial Nerves: II: Discs flat bilaterally; Visual fields grossly normal, pupils equal, round, reactive to light and accommodation III,IV, VI: ptosis not present, extra-ocular motions intact bilaterally V,VII: smile symmetric, facial light touch sensation normal bilaterally VIII: hearing normal bilaterally IX,X: uvula rises symmetrically XI: bilateral shoulder shrug XII: midline tongue extension Motor: Right : Upper extremity   5/5    Left:     Upper extremity   5/5  Lower extremity   5/5     Lower extremity   5/5 Tone and bulk:normal tone throughout; no atrophy noted Sensory: Pinprick and light touch intact throughout, bilaterally Deep Tendon Reflexes: 2+ and symmetric throughout Plantars: Right: downgoing   Left: downgoing Cerebellar: normal finger-to-nose, normal rapid alternating movements and normal heel-to-shin test Gait: Not tested      Lab Results: Basic Metabolic Panel:  Recent Labs Lab 11/23/16 2030  NA 139  K 4.1  CL 110  CO2 21*  GLUCOSE 104*  BUN 14  CREATININE 0.78  CALCIUM 9.1    Liver Function Tests:  Recent Labs Lab 11/23/16 2030  AST 16  ALT 18  ALKPHOS 104  BILITOT 0.6  PROT 7.4  ALBUMIN 3.9    Recent Labs Lab 11/23/16 2030  LIPASE 11   No results for input(s): AMMONIA in the last 168 hours.  CBC:  Recent Labs Lab 11/23/16 2030  WBC 7.0  HGB 13.6  HCT 41.0  MCV 81.2  PLT 172    Cardiac Enzymes: No results for input(s): CKTOTAL, CKMB, CKMBINDEX, TROPONINI in the last 168 hours.  Lipid  Panel: No results for input(s): CHOL, TRIG, HDL, CHOLHDL, VLDL, LDLCALC in the last 168 hours.  CBG: No results for input(s): GLUCAP in the last 168 hours.  Microbiology: Results for orders placed or performed during the hospital encounter of 09/05/14  Urine culture     Status: None   Collection Time: 09/05/14 12:21 PM  Result Value Ref Range Status   Specimen Description URINE, CLEAN CATCH  Final   Special Requests NONE  Final   Colony Count   Final    20,OOO COLONIES/ML Performed at [facility]    Culture   Final    Multiple bacterial morphotypes present, none predominant. Suggest appropriate recollection if clinically indicated. Performed at [facility]    Report Status 09/07/2014 FINAL  Final  Blood culture (routine x 2)     Status: None   Collection Time: 09/05/14 12:30 PM  Result Value Ref Range Status   Specimen Description BLOOD LEFT ARM  Final   Special Requests BOTTLES DRAWN AEROBIC AND ANAEROBIC 5CC EACH  Final   Culture   Final    NO GROWTH 5 DAYS Performed at [facility]    Report Status 09/11/2014 FINAL  Final  Blood culture (routine x 2)     Status: None   Collection Time: 09/05/14  1:30 PM  Result Value Ref Range Status   Specimen Description BLOOD RIGHT ANTECUBITAL  Final   Special Requests BOTTLES DRAWN AEROBIC AND ANAEROBIC 5ML  Final   Culture   Final    NO GROWTH 5 DAYS Performed at [facility]    Report Status 09/11/2014 FINAL  Final    Coagulation Studies: No results for input(s): LABPROT, INR in the last 72 hours.  Imaging: Ct Head Wo Contrast  Result Date: 11/27/2016 CLINICAL DATA:  Seizure today.  Headache. EXAM: CT HEAD WITHOUT CONTRAST TECHNIQUE: Contiguous axial images were obtained from the base of the skull through the vertex without intravenous contrast. COMPARISON:  Brain MRI and head CT scan 02/05/2016. FINDINGS: Brain: The brain appears normal without hemorrhage, infarct, mass lesion, mass effect,  midline shift or abnormal extra-axial fluid collection. No hydrocephalus or pneumocephalus. Vascular: Negative. Skull: Intact. 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 Sinuses/Orbits: Negative. Other: None. IMPRESSION: Negative head CT. Electronically Signed   By: Inge Rise M.D.   On: 11/27/2016 14:10       Assessment and plan per attending neurologist  [name] PA-C [facility] [phone]  11/27/2016, 2:58 PM   Assessment/Plan:  This is a 51 year old female with history of TBI, seizure history, she is on Lamictal and Topamax. She was sent here from her primary neurology office secondary to having staring spells.  While in the ER patient had a couple spells which she was flailing her arms independently shaking her head side to side I 1. ammonia was placed into the nose as stated above and patient avoided it to the point where she was almost standing up. These do not  clinically correlate with seizure activity however they are more clinically correlated with pseudoseizure. Patient would benefit from long-term monitoring out [place]. Agree with increasing Lamictal to 150 twice a day and will load patient with 1000 mg of Keppra while she is here.  And discussed with Cecille Rubin and we're both in agreement. At this point neurology will sign off.

## 2016-11-27 NOTE — Progress Notes (Signed)
[facility]  PATIENT: [name] [name] [name] DOB: 1965-10-20   REASON FOR VISIT follow-up for increased seizure activity last 2 weeks HISTORY FROM: Patient    HISTORY OF PRESENT ILLNESS:UPDATE 04/03/2018CM [name] [name] [name], 51 year old [name] [name] returns for follow-up on an urgent basis with increased seizure activity last 2 weeks. 2 recent seizures that occurred during her sleep. Her husband awakened her as she says she was moving her extremities and woke him up. She also claims she is having increased seizure activity during the day. She denies having seizures in her sleep before. She claims she was incontinent of urine. She is currently on Lamictal 100 twice daily and Topamax 100 twice daily. She continues to have frequent headaches. She has tried and failed multiple preventives for headaches in the past and has been referred to [facility]. She returns for reevaluation. She has previously been seen in this office by [name] [name] [name] and Dr. Brett [name] [name].     10/29/16 MMMs. [name] [name] [name] is a 51 year old [name] [name] with a history of traumatic brain injury with subsequent migraines and seizures. She returns today for follow-up. She is currently on Topamax and Lamictal for seizures. She denies any seizure events. She continues to have 2-4 headaches a week. Her headache typically occurs all over her head extending down the neck into the shoulders. She does have photophobia, phonophobia, nausea and vomiting. In the past she has tried and failed several different medications for her migraines. This includes Lamictal, Topamax, Imitrex, gabapentin, Migranal, Excedrin, Depakote and Botox. She also reports that since her head injury she has had trouble with her gait. She reports that she is in physical therapy currently for back pain. She also sees Dr. Leafy [name] for weight management. She returns today for an evaluation.  HISTORY 07/23/16: [name] [name] [name] is a 51 year old [name] [name] with a history of traumatic  brain injury migraines and seizures. I have listed belowa multitude of medications for her migraines -all of which have been unsuccessful. She is currently on Topamax 200 mg daily and Lamictal 100 mg twice a day. These medications often help headaches as well but has failed her. Topiramate and Lamictal are used for seizure control in her case. She has not had a seizure in almost 7 years. She states that she continues to have daily headaches. Her headache location can vary. She does confirm photophobia, phonophobia, nausea and vomiting. She has tried Botox injections but reports she had an allergic reaction- swelling around the lips. Patient reports that she has not had any seizure events. She continues to have dizzy episodes.She had a very difficult time the last 2-1/2 years since she suffered a traumatic brain injury,which exacerbated her headaches. She remains morbidly obese.     REVIEW OF SYSTEMS: Full 14 system review of systems performed and notable only for those listed, all others are neg:  Constitutional: Fatigue Cardiovascular: neg Ear/Nose/Throat: neg  Skin: neg Eyes: neg Respiratory: neg Gastroitestinal: Abdominal pain Hematology/Lymphatic: neg  Endocrine: neg Musculoskeletal:neg Allergy/Immunology: neg Neurological: Dizziness headache seizure Psychiatric: neg Sleep : neg   ALLERGIES: Allergies  Allergen Reactions  . Amoxicillin Hives and Swelling  . Biaxin [Clarithromycin] Hives  . Erythromycin Base Hives  . Penicillins Hives and Swelling    Has patient had a PCN reaction causing immediate rash, facial/tongue/throat swelling, SOB or lightheadedness with hypotension: Yes Has patient had a PCN reaction causing severe rash involving mucus membranes or skin necrosis: No Has patient had a PCN reaction that required hospitalization No Has patient had a PCN reaction occurring  within the last 10 years: No If all of the above answers are "NO", then may proceed with  Cephalosporin use.  Marland Kitchen Zithromax [Azithromycin] Hives and Nausea Only    HOME MEDICATIONS: Outpatient Medications Prior to Visit  Medication Sig Dispense Refill  . albuterol (PROVENTIL HFA;VENTOLIN HFA) 108 (90 BASE) MCG/ACT inhaler Inhale 2 puffs into the lungs every 6 (six) hours as needed for wheezing.    Marland Kitchen albuterol (PROVENTIL) (2.5 MG/3ML) 0.083% nebulizer solution Take 2.5 mg by nebulization every 6 (six) hours as needed for wheezing or shortness of breath.    . dihydroergotamine (MIGRANAL) 4 MG/ML nasal spray Place 1 spray into the nose as needed for migraine. Use in one nostril as directed.  No more than 4 sprays in one hour (Patient taking differently: Place 1 spray into the nose See admin instructions. Instil 1 spray into one nostril up to 4 times as needed for migraines (no more than 4 sprays in one hour)) 8 mL 12  . DULoxetine (CYMBALTA) 30 MG capsule Take 60 mg by mouth daily.    Marland Kitchen estradiol (ESTRACE) 1 MG tablet Take 1 tablet (1 mg total) by mouth daily. 30 tablet 1  . gabapentin (NEURONTIN) 300 MG capsule Take 300 mg by mouth at bedtime.     Marland Kitchen HYDROcodone-acetaminophen (NORCO) 5-325 MG tablet Take 1-2 tablets by mouth every 6 (six) hours as needed. 15 tablet 0  . lamoTRIgine (LAMICTAL) 100 MG tablet Take 1 tablet (100 mg total) by mouth 2 (two) times daily. 180 tablet 0  . meclizine (ANTIVERT) 25 MG tablet Take 25 mg by mouth 3 (three) times daily as needed for dizziness.   1  . metFORMIN (GLUCOPHAGE) 500 MG tablet Take 1 tablet (500 mg total) by mouth daily with breakfast. 30 tablet 0  . metoprolol tartrate (LOPRESSOR) 25 MG tablet Take 1 tablet (25 mg total) by mouth 2 (two) times daily. (Patient taking differently: Take 25 mg by mouth daily. ) 60 tablet 0  . ondansetron (ZOFRAN) 8 MG tablet Take 1 tablet (8 mg total) by mouth every 8 (eight) hours as needed for nausea. 6 tablet 0  . promethazine (PHENERGAN) 50 MG tablet Take 0.5 tablets (25 mg total) by mouth every 6 (six) hours  as needed for nausea or vomiting. 30 tablet 0  . ranitidine (ZANTAC) 300 MG tablet Take 300 mg by mouth daily as needed for heartburn (acid reflux).     . topiramate (TOPAMAX) 100 MG tablet Take 1 tablet (100 mg total) by mouth 2 (two) times daily. 60 tablet 11  . Vitamin D, Ergocalciferol, (DRISDOL) 50000 units CAPS capsule Take 1 capsule (50,000 Units total) by mouth every Tuesday. 30 capsule 0  . clonazePAM (KLONOPIN) 1 MG tablet Take 1 mg by mouth daily as needed (onset of seizure).     . naproxen sodium (ALEVE) 220 MG tablet Take 440 mg by mouth daily as needed (pain).    . Prasterone (INTRAROSA) 6.5 MG INST Place 6.5 mg vaginally at bedtime. (Patient not taking: Reported on 11/27/2016) 30 each 6   Facility-Administered Medications Prior to Visit  Medication Dose Route Frequency Provider Last Rate Last Dose  . fentaNYL (SUBLIMAZE) injection 50 mcg  50 mcg Intravenous UD Lance Bosch, MD        PAST MEDICAL HISTORY: Past Medical History:  Diagnosis Date  . Anginal pain ([place])   . Anxiety   . Asthma   . Atrial fibrillation ([place])   . Back pain   . Back  pain    bulging disk  . Chest pain   . Epilepsy ([place])   . Fibroid   . GERD (gastroesophageal reflux disease)   . Head injury   . Joint pain   . Migraine   . Obesity, Class III, BMI [name]-49.9 (morbid obesity) ([place]) 12/21/2012  . Ovarian cyst   . Prediabetes   . Seizures ([place])   . Shortness of breath dyspnea   . Surgical menopause   . TBI (traumatic brain injury) ([place])   . TIA (transient ischemic attack)   . Vitamin D deficiency     PAST SURGICAL HISTORY: Past Surgical History:  Procedure Laterality Date  . ABDOMINAL HYSTERECTOMY     wiht bso  . CARDIAC CATHETERIZATION N/A 01/21/2015   Procedure: Left Heart Cath;  Surgeon: Yolonda Kida, MD;  Location: [facility];  Service: Cardiovascular;  Laterality: N/A;  . CESAREAN SECTION    . COLONOSCOPY N/A 11/21/2012   Procedure: COLONOSCOPY;  Surgeon: Irene Shipper, MD;   Location: [facility];  Service: Endoscopy;  Laterality: N/A;  . DILATION AND CURETTAGE OF UTERUS    . ESOPHAGOGASTRODUODENOSCOPY N/A 11/21/2012   Procedure: ESOPHAGOGASTRODUODENOSCOPY (EGD);  Surgeon: Irene Shipper, MD;  Location: [facility];  Service: Endoscopy;  Laterality: N/A;  . HERNIA REPAIR  12/17/12   laparoscopic incisional   . INCISIONAL HERNIA REPAIR N/A 12/17/2012   Procedure: LAPAROSCOPIC INCISIONAL HERNIA;  Surgeon: Harl Bowie, MD;  Location: [facility];  Service: General;  Laterality: N/A;  . INSERTION OF MESH N/A 12/17/2012   Procedure: INSERTION OF MESH;  Surgeon: Harl Bowie, MD;  Location: [facility];  Service: General;  Laterality: N/A;    FAMILY HISTORY: Family History  Problem Relation Age of Onset  . Hypertension Mother   . Depression Mother   . Heart disease Mother     deceased 2014/02/17  . Heart attack Father   . Hypertension Father   . Emphysema Father   . COPD Father   . Heart disease Father   . Diabetes Father   . Breast cancer Cousin   . Cancer Neg Hx   . Colon cancer Neg Hx   . Esophageal cancer Neg Hx   . Stomach cancer Neg Hx   . Pancreatic cancer Neg Hx   . Liver disease Neg Hx     SOCIAL HISTORY: Social History   Social History  . Marital status: Divorced    Spouse name: N/A  . Number of children: 2  . Years of education: 12+   Occupational History  . 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 OFFICE ASSISTANT  [place]   Social History Main Topics  . Smoking status: Never Smoker  . Smokeless tobacco: Never Used  . Alcohol use 0.0 oz/week     Comment: rare  . Drug use: No  . Sexual activity: Yes    Birth control/ protection: Surgical   Other Topics Concern  . Not on file   Social History Narrative   Patient is single with 2 children   Patient is right handed   Patient has some college   Patient does not drink caffeine           PHYSICAL EXAM  Vitals:   11/27/16 1050  BP:  120/75  Pulse: 65  Weight: 278 lb 6.4 oz (126.3 kg)   Body mass index is 50.92 kg/m.  Generalized: Well developed, Morbidly obese [name] [name] in no acute distress  Head: normocephalic and atraumatic,.  Oropharynx benign  Neck: Supple, no carotid bruits  Musculoskeletal: No deformity   Neurological examination   Mentation: Alert oriented to time, place, history taking. Attention span and concentration appropriate. Recent and remote memory intact.  Follows all commands speech and language fluent.   Cranial nerve II-XII: Pupils were equal round reactive to light extraocular movements were full, visual field were full on confrontational test. Facial sensation and strength were normal. hearing was intact to finger rubbing bilaterally. Uvula tongue midline. head turning and shoulder shrug were normal and symmetric.Tongue protrusion into cheek strength was normal. Motor: normal bulk and tone, full strength in the BUE, BLE, fine finger movements normal, no pronator drift.  Sensory: normal and symmetric to light touch, pinprick, and  Vibration, in the upper and lower extremities no evidence of extinction  Coordination: finger-nose-finger, heel-to-shin bilaterally, no dysmetria Reflexes: Symmetric upper and lower, plantar responses were flexor bilaterally. Gait and Station: Rising up from seated position without assistance, wide based  stance,  able to perform tiptoe, and heel walking without difficulty. Tandem gait is unsteady. No assistive device  DIAGNOSTIC DATA (LABS, IMAGING, TESTING) - I reviewed patient records, labs, notes, testing and imaging myself where available.  Lab Results  Component Value Date   WBC 7.0 11/23/2016   HGB 13.6 11/23/2016   HCT 41.0 11/23/2016   MCV 81.2 11/23/2016   PLT 172 11/23/2016      Component Value Date/Time   NA 139 11/23/2016 2030   NA 144 10/29/2016 1637   NA 140 12/01/2014 1109   K 4.1 11/23/2016 2030   K 3.8 12/01/2014 1109   CL 110 11/23/2016 2030     CL 111 12/01/2014 1109   CO2 21 (L) 11/23/2016 2030   CO2 23 12/01/2014 1109   GLUCOSE 104 (H) 11/23/2016 2030   GLUCOSE 99 12/01/2014 1109   BUN 14 11/23/2016 2030   BUN 13 10/29/2016 1637   BUN 17 12/01/2014 1109   CREATININE 0.78 11/23/2016 2030   CREATININE 0.83 12/01/2014 1109   CREATININE 0.82 06/22/2013 1152   CALCIUM 9.1 11/23/2016 2030   CALCIUM 8.7 (L) 12/01/2014 1109   PROT 7.4 11/23/2016 2030   PROT 7.0 10/29/2016 1637   PROT 7.7 09/26/2014 1837   ALBUMIN 3.9 11/23/2016 2030   ALBUMIN 3.9 10/29/2016 1637   ALBUMIN 3.5 09/26/2014 1837   AST 16 11/23/2016 2030   AST 17 09/26/2014 1837   ALT 18 11/23/2016 2030   ALT 24 09/26/2014 1837   ALKPHOS 104 11/23/2016 2030   ALKPHOS 116 09/26/2014 1837   BILITOT 0.6 11/23/2016 2030   BILITOT 0.2 10/29/2016 1637   BILITOT 0.2 09/26/2014 1837   GFRNONAA >60 11/23/2016 2030   GFRNONAA >60 12/01/2014 1109   GFRAA >60 11/23/2016 2030   GFRAA >60 12/01/2014 1109   Lab Results  Component Value Date   CHOL 130 10/09/2016   HDL 41 10/09/2016   LDLCALC 72 10/09/2016   TRIG 84 10/09/2016   CHOLHDL 3.2 10/09/2016   Lab Results  Component Value Date   HGBA1C 5.5 10/09/2016   Lab Results  Component Value Date   VZDGLOVF64 332 09/10/2016   Lab Results  Component Value Date   TSH 1.970 10/09/2016      ASSESSMENT AND PLAN  51 y.o. year old [name] [name]  has a past medical history of Anginal pain ([place]); Anxiety; Asthma; Atrial fibrillation ([place]); Back pain; Back pain; Chest pain; Epilepsy ([place]); Fibroid; GERD (gastroesophageal reflux disease); Head injury; Joint pain; Migraine; Obesity, Class III,  BMI [name]-49.9 (morbid obesity) ([place]) (12/21/2012); Ovarian cyst; Prediabetes; Seizures ([place]); Shortness of breath dyspnea; Surgical menopause; TBI (traumatic brain injury) ([place]); TIA (transient ischemic attack); and Vitamin D deficiency. here to follow-up for  1Recent seizure activity 2History of migraine headaches 3. History of  traumatic brain injury The patient is a current patient of Dr. Brett [name] [name]  who is out of the office today . This note is sent to the work in doctor.      PLAN: Increase Lamictal to 150mg  twice daily Continue Topamax 100 twice daily Reviewed recent blood work EEG May think about sleep study in the future last in 2016 Follow up in 2 months with [name] [name] [name], [name] [name] [name], [name] [name] [name], [name], APRN  Late entry, patient had a "spell" at checkout. B/P 138/76. P120. O2 sat 89% BS 89. 911 called  And patient sent to the ER [name] [address], [place], [place] [address] [phone]

## 2016-11-27 NOTE — Telephone Encounter (Signed)
Pt would like to try and be seen sometime today if at all possible due to having a lot of petite seziures on yesterday and even moments before calling our office this morning.

## 2016-11-27 NOTE — Telephone Encounter (Signed)
[name] (physcian Assistant) at [facility] called bcz of this pt being sent over by Hoyle Sauer, he is asking to be called back to discuss pt he provided the # of [phone]

## 2016-11-27 NOTE — Progress Notes (Signed)
I have reviewed and agreed above plan. 

## 2016-11-27 NOTE — Telephone Encounter (Signed)
I called pt. She has been having seizures in her sleep and she has noticed them during the day yesterday and today as well. This is a change; when she was last seen in this office on 10/29/2016, she denied having any seizure activity. Pt denies missing any doses of lamictal and topamax. Pt wants to be seen today. I advised her that Dr. Brett Fairy is not here and that Jinny Blossom, NP is full. Pt still wants to be seen today. I will speak to Dr. Krista Blue ([place]) and Hoyle Sauer, NP.

## 2016-11-27 NOTE — ED Notes (Signed)
Neuro PA at bedside.  

## 2016-11-27 NOTE — Progress Notes (Addendum)
Pt had an episode at check out today. I was notified immediately and found pt to be unresponsive in a chair. Pt had a pulse and was breathing but was not responding to verbal cues and had her eyes closed. With the help of her husband and several other nurses, pt was helped to the floor safely. A blood pressure (138/76, HR of 120) and SpO2 (89%) was obtained and the work-in doctor and Hoyle Sauer, NP summoned. After about 5 minutes of unresponsiveness, pt became responsive and told Dr. Krista Blue that she was "hurting me" because Dr. Krista Blue was squeezing her fingernail to illicit a response. Pt began to then cry and hug her husband around the knees. Pt was assisted to a wheelchair and brought to an exam room. Pt then became unresponsive again, staring into space, pupils equal and reactive to light, blink reflex intact for about 5 minutes. BP obtained, 157/95, HR of 94, and blood sugar of 86, breathing without difficulty. Pt then closed eyes but eyelids appeared to be twitching, for about 5 minutes. It was suggested to the pt's husband that 911 be called, pt's husband agreeable to pt being taken to ER for further evaluation. Pt became responsive again and started to flail with her arms and legs. Pt was asked to remain in the wheelchair when the pt attempted to stand up. EMS arrived, hooked pt to heart monitor and started IV. When pt was asked where she was, pt responded with "home" or "beach". When told she was at the doctor's office, she asked "How did I get there? I haven't seen the doctor." Pt was transported to ER in stable condition by EMS.

## 2016-11-27 NOTE — ED Provider Notes (Signed)
[facility] DEPT Provider Note   CSN: [id number] Arrival date & time: 11/27/16  1252     History   Chief Complaint Chief Complaint  Patient presents with  . Seizures    HPI [name] [name] is a 51 y.o. female hx of absence seizures, afib, GERD, obesity, migraines here with seizures. Per the husband, patient has been having seizure like activity in her sleep over the past few days. Patient made an urgent appointment at [facility] neuro today and saw the NP there. Patient was scheduled for EEG tomorrow. However, in the office, she was witnessed absence seizure x 2 and was sent to the ED for further evaluation. Patient states that she doesn't remember what happened. She has persistent migraines for years that are unchanged. She has been taking her lamictal, topamax.   The history is provided by the patient, the spouse and medical records.    Level V caveat- seizure   Past Medical History:  Diagnosis Date  . Anginal pain ([place])   . Anxiety   . Asthma   . Atrial fibrillation ([place])   . Back pain   . Back pain    bulging disk  . Chest pain   . Epilepsy ([place])   . Fibroid   . GERD (gastroesophageal reflux disease)   . Head injury   . Joint pain   . Migraine   . Obesity, Class III, BMI 40-49.9 (morbid obesity) ([place]) 12/21/2012  . Ovarian cyst   . Prediabetes   . Seizures ([place])   . Shortness of breath dyspnea   . Surgical menopause   . TBI (traumatic brain injury) ([place])   . TIA (transient ischemic attack)   . Vitamin D deficiency     Patient Active Problem List   Diagnosis Date Noted  . Prediabetes 11/26/2016  . Vitamin D deficiency 11/26/2016  . Surgical menopause on hormone replacement therapy 09/08/2015  . Incontinence in female 09/08/2015  . Back pain at L4-L5 level 09/07/2015  . DDD (degenerative disc disease), lumbosacral 09/07/2015  . Lumbosacral radiculopathy 09/07/2015  . Migraine with aura and with status migrainosus, not intractable 07/20/2015  . Subarachnoid  hemorrhage following injury, with loss of consciousness ([place]) 07/20/2015  . TBI (traumatic brain injury) ([place]) 07/20/2015  . Partial symptomatic epilepsy with complex partial seizures, not intractable, without status epilepticus ([place]) 07/20/2015  . Intractable migraine with aura with status migrainosus 04/27/2015  . Chronic post-traumatic headache, not intractable 04/27/2015  . Morbid obesity ([place])   . TIA (transient ischemic attack) 04/25/2015  . Paresthesia 04/22/2015  . CVA (cerebral infarction) 04/22/2015  . Right sided weakness 04/22/2015  . Seizure disorder ([place]) 04/22/2015  . Chronic headaches 04/22/2015  . Atrial fibrillation ([place]) 04/22/2015  . Right arm weakness   . Asthma, mild intermittent   . History of recent fall 03/09/2014  . 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 Obesity, Class III, BMI 40-49.9 (morbid obesity) ([place]) 12/21/2012  . GERD (gastroesophageal reflux disease)   . Incisional hernia, without obstruction or gangrene 11/06/2012  . Status post TAH-BSO 10/09/2003    Past Surgical History:  Procedure Laterality Date  . ABDOMINAL HYSTERECTOMY     wiht bso  . CARDIAC CATHETERIZATION N/A 01/21/2015   Procedure: Left Heart Cath;  Surgeon: Yolonda Kida, MD;  Location: [facility] CV LAB;  Service: Cardiovascular;  Laterality: N/A;  . CESAREAN SECTION    . COLONOSCOPY N/A 11/21/2012   Procedure: COLONOSCOPY;  Surgeon: Irene Shipper, MD;  Location: [facility];  Service: Endoscopy;  Laterality: N/A;  . DILATION AND CURETTAGE OF UTERUS    . ESOPHAGOGASTRODUODENOSCOPY N/A 11/21/2012   Procedure: ESOPHAGOGASTRODUODENOSCOPY (EGD);  Surgeon: Irene Shipper, MD;  Location: [facility];  Service: Endoscopy;  Laterality: N/A;  . HERNIA REPAIR  12/17/12   laparoscopic incisional   . INCISIONAL HERNIA REPAIR N/A 12/17/2012   Procedure: LAPAROSCOPIC INCISIONAL HERNIA;  Surgeon: Harl Bowie, MD;  Location: [facility];  Service: General;  Laterality: N/A;  .  INSERTION OF MESH N/A 12/17/2012   Procedure: INSERTION OF MESH;  Surgeon: Harl Bowie, MD;  Location: [facility];  Service: General;  Laterality: N/A;    OB History    Gravida Para Term Preterm AB Living   6 2 2   4 1    SAB TAB Ectopic Multiple Live Births   4       2       Home Medications    Prior to Admission medications   Medication Sig Start Date End Date Taking? Authorizing Provider  albuterol (PROVENTIL HFA;VENTOLIN HFA) 108 (90 BASE) MCG/ACT inhaler Inhale 2 puffs into the lungs every 6 (six) hours as needed for wheezing.    Historical Provider, MD  albuterol (PROVENTIL) (2.5 MG/3ML) 0.083% nebulizer solution Take 2.5 mg by nebulization every 6 (six) hours as needed for wheezing or shortness of breath.    Historical Provider, MD  clonazePAM (KLONOPIN) 1 MG tablet Take 1 mg by mouth daily as needed (onset of seizure).     Historical Provider, MD  dihydroergotamine (MIGRANAL) 4 MG/ML nasal spray Place 1 spray into the nose as needed for migraine. Use in one nostril as directed.  No more than 4 sprays in one hour Patient taking differently: Place 1 spray into the nose See admin instructions. Instil 1 spray into one nostril up to 4 times as needed for migraines (no more than 4 sprays in one hour) 07/23/16   Larey Seat, MD  DULoxetine (CYMBALTA) 30 MG capsule Take 60 mg by mouth daily.    Historical Provider, MD  estradiol (ESTRACE) 1 MG tablet Take 1 tablet (1 mg total) by mouth daily. 11/09/16   Alanda Slim Defrancesco, MD  gabapentin (NEURONTIN) 300 MG capsule Take 300 mg by mouth at bedtime.  09/28/16 09/28/17  Historical Provider, MD  HYDROcodone-acetaminophen (NORCO) 5-325 MG tablet Take 1-2 tablets by mouth every 6 (six) hours as needed. 11/23/16   Veryl Speak, MD  lamoTRIgine (LAMICTAL) 150 MG tablet Take 1 tablet (150 mg total) by mouth 2 (two) times daily. 11/27/16   Dennie Bible, NP  meclizine (ANTIVERT) 25 MG tablet Take 25 mg by mouth 3 (three) times daily as needed  for dizziness.  10/10/16   Historical Provider, MD  metFORMIN (GLUCOPHAGE) 500 MG tablet Take 1 tablet (500 mg total) by mouth daily with breakfast. 11/26/16   Caren Drucie Opitz, MD  metoprolol tartrate (LOPRESSOR) 25 MG tablet Take 1 tablet (25 mg total) by mouth 2 (two) times daily. Patient taking differently: Take 25 mg by mouth daily.  04/25/15   Domenic Polite, MD  naproxen sodium (ALEVE) 220 MG tablet Take 440 mg by mouth daily as needed (pain).    Historical Provider, MD  ondansetron (ZOFRAN) 8 MG tablet Take 1 tablet (8 mg total) by mouth every 8 (eight) hours as needed for nausea. 11/23/16   Veryl Speak, MD  Prasterone (INTRAROSA) 6.5 MG INST Place 6.5 mg vaginally at bedtime. Patient not taking: Reported on 11/27/2016 10/24/16   Hassell Done  A Defrancesco, MD  promethazine (PHENERGAN) 50 MG tablet Take 0.5 tablets (25 mg total) by mouth every 6 (six) hours as needed for nausea or vomiting. 07/23/16   Asencion Partridge Dohmeier, MD  ranitidine (ZANTAC) 300 MG tablet Take 300 mg by mouth daily as needed for heartburn (acid reflux).  11/15/16   Historical Provider, MD  topiramate (TOPAMAX) 100 MG tablet Take 1 tablet (100 mg total) by mouth 2 (two) times daily. 07/23/16   Asencion Partridge Dohmeier, MD  Vitamin D, Ergocalciferol, (DRISDOL) 50000 units CAPS capsule Take 1 capsule (50,000 Units total) by mouth every Tuesday. 11/27/16   Caren Drucie Opitz, MD    Family History Family History  Problem Relation Age of Onset  . Hypertension Mother   . Depression Mother   . Heart disease Mother     deceased Jan 25, 2014  . Heart attack Father   . Hypertension Father   . Emphysema Father   . COPD Father   . Heart disease Father   . Diabetes Father   . Breast cancer Cousin   . Cancer Neg Hx   . Colon cancer Neg Hx   . Esophageal cancer Neg Hx   . Stomach cancer Neg Hx   . Pancreatic cancer Neg Hx   . Liver disease Neg Hx     Social History Social History  Substance Use Topics  . Smoking status: Never Smoker  . Smokeless  tobacco: Never Used  . Alcohol use 0.0 oz/week     Comment: rare     Allergies   Amoxicillin; Biaxin [clarithromycin]; Erythromycin base; Penicillins; and Zithromax [azithromycin]   Review of Systems Review of Systems  Neurological: Positive for seizures.  All other systems reviewed and are negative.    Physical Exam Updated Vital Signs BP (!) 123/91 (BP Location: Right Arm)   Pulse 72   Temp 98.1 F (36.7 C) (Oral)   Resp 16   LMP  (LMP Unknown) Comment: 2005  SpO2 100%   Physical Exam  Constitutional: She is oriented to person, place, and time.  Awake, alert   HENT:  Mouth/Throat: Oropharynx is clear and moist.  Eyes: EOM are normal. Pupils are equal, round, and reactive to light.  Neck: Normal range of motion. Neck supple.  Cardiovascular: Normal rate, regular rhythm and normal heart sounds.   Pulmonary/Chest: Effort normal and breath sounds normal. No respiratory distress. She has no wheezes.  Abdominal: Soft. Bowel sounds are normal. She exhibits no distension. There is no tenderness. There is no guarding.  Musculoskeletal: Normal range of motion.  Neurological: She is alert and oriented to person, place, and time. No cranial nerve deficit. Coordination normal.  Nl finger to nose, nl strength throughout   Skin: Skin is warm.  Psychiatric: She has a normal mood and affect.  Nursing note and vitals reviewed.    ED Treatments / Results  Labs (all labs ordered are listed, but only abnormal results are displayed) Labs Reviewed  CBC WITH DIFFERENTIAL/PLATELET  COMPREHENSIVE METABOLIC PANEL  LAMOTRIGINE LEVEL  I-STAT CHEM 8, ED    EKG  EKG Interpretation  Date/Time:  Tuesday November 27 2016 13:03:21 EDT Ventricular Rate:  74 PR Interval:    QRS Duration: 90 QT Interval:  403 QTC Calculation: 448 R Axis:   38 Text Interpretation:  Sinus rhythm Low voltage, precordial leads Anteroseptal infarct, old No significant change since last tracing Confirmed by Mazal Ebey   MD, Antawan Mchugh ([id number]) on 11/27/2016 1:21:30 PM       Radiology No results found.  Procedures Procedures (including critical care time)  Medications Ordered in ED Medications  sodium chloride 0.9 % bolus 1,000 mL (1,000 mLs Intravenous New Bag/Given 11/27/16 1320)  LORazepam (ATIVAN) 2 MG/ML injection (2 mg  Given 11/27/16 1339)     Initial Impression / Assessment and Plan / ED Course  I have reviewed the triage vital signs and the nursing notes.  Pertinent labs & imaging results that were available during my care of the patient were reviewed by me and considered in my medical decision making (see chart for details).     [name] [name] is a 51 y.o. female here with seizures. Patient has more frequent seizures for the last few days. Went to [facility] and had 2 witnessed absence seizures there. In the ED, She had another episode that is witnessed by nursing. I came and saw her and she was staring into space and not responding and sitting up in bed, no tonic clonic jerking movements. Will get labs, CT head. Will consult neuro. Given ativan 2 mg IV in the ED and seizure stopped.   3:39 PM CT head unremarkable. Had another episode while neuro PA was seeing the patient and was thought to have pseudoseizure. Husband concerned about her symptoms. Dr. Shon Hale to see patient in the ED and discuss plan on care. Labs still pending. PA recommend keppra IV 1000 mg in the ED. PA contacted [facility] neurologist to update with plan. Signed out to Dr. Tyrone Nine pending labs, evaluation by Dr. Shon Hale.    Final Clinical Impressions(s) / ED Diagnoses   Final diagnoses:  None    New Prescriptions New Prescriptions   No medications on file     Drenda Freeze, MD 11/27/16 [phone]

## 2016-11-27 NOTE — ED Notes (Addendum)
Pt noted to be having grand mal seizure like activity. RN at bedside, MD at bedside. Pt noted to be speaking with eyes open prior to seizure activity end. Pt AxO x4, no incontinence.

## 2016-11-27 NOTE — ED Notes (Signed)
Pt noted to have jerking of arms and legs in bed. Pt attempting to get out of bed, crying loudly that her head hurts. Neuro PA at bedside.

## 2016-11-27 NOTE — ED Triage Notes (Signed)
Pt presents from [facility] neuro for evaluation of repeated sz activity. Pt has hx, takes lamictal and topamax. Pt reports she was standing at the checkout from the doctor office when the staff reported she had 2 absent sz. No incontinence or oral trauma. EMS reports they witness two additional absent sz and gave a total of 5 mg versed. Pt initial post ictal per EMS. Pt AxO x4 on arrival.

## 2016-11-27 NOTE — ED Notes (Signed)
Pt assisted with bedpan. Bedside cleaning complete.

## 2016-11-28 ENCOUNTER — Inpatient Hospital Stay (HOSPITAL_COMMUNITY)
Admit: 2016-11-28 | Discharge: 2016-11-28 | Disposition: A | Discharge status: Home / Self Care | Payer: BC Managed Care – PPO | Attending: Neurology | Admitting: Neurology

## 2016-11-28 ENCOUNTER — Inpatient Hospital Stay: Payer: BC Managed Care – PPO

## 2016-11-28 ENCOUNTER — Inpatient Hospital Stay (HOSPITAL_COMMUNITY)
Admission: EM | Admit: 2016-11-28 | Discharge: 2016-11-29 | DRG: 880 | Disposition: A | Discharge status: Home / Self Care | Payer: BC Managed Care – PPO | Attending: Internal Medicine | Admitting: Internal Medicine

## 2016-11-28 ENCOUNTER — Encounter (HOSPITAL_COMMUNITY): Payer: Self-pay | Admitting: General Practice

## 2016-11-28 DIAGNOSIS — Z79899 Other long term (current) drug therapy: Secondary | ICD-10-CM

## 2016-11-28 DIAGNOSIS — Z79890 Hormone replacement therapy (postmenopausal): Secondary | ICD-10-CM

## 2016-11-28 DIAGNOSIS — M545 Low back pain: Secondary | ICD-10-CM | POA: Diagnosis not present

## 2016-11-28 DIAGNOSIS — G40209 Localization-related (focal) (partial) symptomatic epilepsy and epileptic syndromes with complex partial seizures, not intractable, without status epilepticus: Secondary | ICD-10-CM | POA: Diagnosis not present

## 2016-11-28 DIAGNOSIS — G40909 Epilepsy, unspecified, not intractable, without status epilepticus: Secondary | ICD-10-CM | POA: Diagnosis not present

## 2016-11-28 DIAGNOSIS — Z90710 Acquired absence of both cervix and uterus: Secondary | ICD-10-CM

## 2016-11-28 DIAGNOSIS — Z825 Family history of asthma and other chronic lower respiratory diseases: Secondary | ICD-10-CM

## 2016-11-28 DIAGNOSIS — E8940 Asymptomatic postprocedural ovarian failure: Secondary | ICD-10-CM

## 2016-11-28 DIAGNOSIS — J4520 Mild intermittent asthma, uncomplicated: Secondary | ICD-10-CM | POA: Diagnosis not present

## 2016-11-28 DIAGNOSIS — Z818 Family history of other mental and behavioral disorders: Secondary | ICD-10-CM

## 2016-11-28 DIAGNOSIS — I209 Angina pectoris, unspecified: Secondary | ICD-10-CM | POA: Diagnosis present

## 2016-11-28 DIAGNOSIS — R569 Unspecified convulsions: Secondary | ICD-10-CM

## 2016-11-28 DIAGNOSIS — R451 Restlessness and agitation: Secondary | ICD-10-CM

## 2016-11-28 DIAGNOSIS — Z8249 Family history of ischemic heart disease and other diseases of the circulatory system: Secondary | ICD-10-CM

## 2016-11-28 DIAGNOSIS — G8929 Other chronic pain: Secondary | ICD-10-CM | POA: Diagnosis present

## 2016-11-28 DIAGNOSIS — Z6841 Body Mass Index (BMI) 40.0 and over, adult: Secondary | ICD-10-CM | POA: Diagnosis not present

## 2016-11-28 DIAGNOSIS — Z780 Asymptomatic menopausal state: Secondary | ICD-10-CM | POA: Diagnosis not present

## 2016-11-28 DIAGNOSIS — Z881 Allergy status to other antibiotic agents status: Secondary | ICD-10-CM

## 2016-11-28 DIAGNOSIS — K219 Gastro-esophageal reflux disease without esophagitis: Secondary | ICD-10-CM | POA: Diagnosis present

## 2016-11-28 DIAGNOSIS — Z8673 Personal history of transient ischemic attack (TIA), and cerebral infarction without residual deficits: Secondary | ICD-10-CM

## 2016-11-28 DIAGNOSIS — M549 Dorsalgia, unspecified: Secondary | ICD-10-CM | POA: Diagnosis present

## 2016-11-28 DIAGNOSIS — R7303 Prediabetes: Secondary | ICD-10-CM | POA: Diagnosis present

## 2016-11-28 DIAGNOSIS — Z87820 Personal history of traumatic brain injury: Secondary | ICD-10-CM

## 2016-11-28 DIAGNOSIS — I480 Paroxysmal atrial fibrillation: Secondary | ICD-10-CM | POA: Diagnosis not present

## 2016-11-28 DIAGNOSIS — S069X0S Unspecified intracranial injury without loss of consciousness, sequela: Secondary | ICD-10-CM

## 2016-11-28 DIAGNOSIS — E559 Vitamin D deficiency, unspecified: Secondary | ICD-10-CM

## 2016-11-28 DIAGNOSIS — F445 Conversion disorder with seizures or convulsions: Principal | ICD-10-CM

## 2016-11-28 DIAGNOSIS — Z833 Family history of diabetes mellitus: Secondary | ICD-10-CM

## 2016-11-28 DIAGNOSIS — R51 Headache: Secondary | ICD-10-CM

## 2016-11-28 DIAGNOSIS — F419 Anxiety disorder, unspecified: Secondary | ICD-10-CM | POA: Diagnosis present

## 2016-11-28 DIAGNOSIS — I4891 Unspecified atrial fibrillation: Secondary | ICD-10-CM | POA: Diagnosis present

## 2016-11-28 DIAGNOSIS — R519 Headache, unspecified: Secondary | ICD-10-CM | POA: Diagnosis present

## 2016-11-28 DIAGNOSIS — Z7984 Long term (current) use of oral hypoglycemic drugs: Secondary | ICD-10-CM | POA: Diagnosis not present

## 2016-11-28 DIAGNOSIS — G43709 Chronic migraine without aura, not intractable, without status migrainosus: Secondary | ICD-10-CM

## 2016-11-28 DIAGNOSIS — Z880 Allergy status to penicillin: Secondary | ICD-10-CM

## 2016-11-28 DIAGNOSIS — Z90722 Acquired absence of ovaries, bilateral: Secondary | ICD-10-CM

## 2016-11-28 HISTORY — DX: Other complications of anesthesia, initial encounter: T88.59XA

## 2016-11-28 HISTORY — DX: Headache: R51

## 2016-11-28 HISTORY — DX: Headache, unspecified: R51.9

## 2016-11-28 HISTORY — DX: Other chronic pain: G89.29

## 2016-11-28 HISTORY — DX: Adverse effect of unspecified anesthetic, initial encounter: T41.45XA

## 2016-11-28 HISTORY — DX: Dorsalgia, unspecified: M54.9

## 2016-11-28 HISTORY — DX: Unspecified chronic bronchitis: J42

## 2016-11-28 HISTORY — DX: Prediabetes: R73.03

## 2016-11-28 LAB — GLUCOSE, CAPILLARY
GLUCOSE-CAPILLARY: 70 mg/dL (ref 65–99)
Glucose-Capillary: 84 mg/dL (ref 65–99)
Glucose-Capillary: 101 mg/dL — ABNORMAL HIGH (ref 65–99)

## 2016-11-28 LAB — CBC
HCT: 35.2 % — ABNORMAL LOW (ref 36.0–46.0)
HEMOGLOBIN: 11.8 g/dL — AB (ref 12.0–15.0)
MCH: 26.3 pg (ref 26.0–34.0)
MCHC: 33.5 g/dL (ref 30.0–36.0)
MCV: 78.6 fL (ref 78.0–100.0)
PLATELETS: 167 10*3/uL (ref 150–400)
RBC: 4.48 MIL/uL (ref 3.87–5.11)
RDW: 14.6 % (ref 11.5–15.5)
WBC: 5.7 10*3/uL (ref 4.0–10.5)

## 2016-11-28 LAB — I-STAT CHEM 8, ED
BUN: 10 mg/dL (ref 6–20)
Calcium, Ion: 1.17 mmol/L (ref 1.15–1.40)
Chloride: 111 mmol/L (ref 101–111)
Creatinine, Ser: 0.9 mg/dL (ref 0.44–1.00)
Glucose, Bld: 98 mg/dL (ref 65–99)
HEMATOCRIT: 37 % (ref 36.0–46.0)
HEMOGLOBIN: 12.6 g/dL (ref 12.0–15.0)
Potassium: 3.7 mmol/L (ref 3.5–5.1)
SODIUM: 145 mmol/L (ref 135–145)
TCO2: 22 mmol/L (ref 0–100)

## 2016-11-28 LAB — CBG MONITORING, ED
Glucose-Capillary: 79 mg/dL (ref 65–99)
Glucose-Capillary: 75 mg/dL (ref 65–99)

## 2016-11-28 LAB — LAMOTRIGINE LEVEL: Lamotrigine Lvl: 3.1 ug/mL (ref 2.0–20.0)

## 2016-11-28 LAB — CREATININE, SERUM
CREATININE: 0.81 mg/dL (ref 0.44–1.00)
GFR calc Af Amer: >60 mL/min (ref >60)

## 2016-11-28 MED ORDER — METOPROLOL TARTRATE 25 MG PO TABS
25.0000 mg | ORAL_TABLET | Freq: Two times a day (BID) | ORAL | Status: DC
  Administered 2016-11-28 – 2016-11-29 (×3): 25 mg via ORAL
  Filled 2016-11-28 (×3): qty 1

## 2016-11-28 MED ORDER — GABAPENTIN 300 MG PO CAPS
300.0000 mg | ORAL_CAPSULE | Freq: Every day | ORAL | Status: DC
  Administered 2016-11-28: 300 mg via ORAL
  Filled 2016-11-28: qty 1

## 2016-11-28 MED ORDER — INSULIN ASPART 100 UNIT/ML ~~LOC~~ SOLN: 0.0000 [IU] | Freq: Three times a day (TID) | SUBCUTANEOUS | Status: DC

## 2016-11-28 MED ORDER — SODIUM CHLORIDE 0.9 % IV SOLN
INTRAVENOUS | Status: DC
  Administered 2016-11-28: 21:00:00 via INTRAVENOUS

## 2016-11-28 MED ORDER — ACETAMINOPHEN 650 MG RE SUPP: 650.0000 mg | Freq: Four times a day (QID) | RECTAL | Status: DC | PRN

## 2016-11-28 MED ORDER — ENOXAPARIN SODIUM 40 MG/0.4ML ~~LOC~~ SOLN
40.0000 mg | SUBCUTANEOUS | Status: DC
  Administered 2016-11-28: 40 mg via SUBCUTANEOUS
  Filled 2016-11-28: qty 0.4

## 2016-11-28 MED ORDER — ACETAMINOPHEN 325 MG PO TABS
650.0000 mg | ORAL_TABLET | Freq: Four times a day (QID) | ORAL | Status: DC | PRN
  Administered 2016-11-28: 650 mg via ORAL
  Filled 2016-11-28: qty 2

## 2016-11-28 MED ORDER — SODIUM CHLORIDE 0.9 % IV SOLN
INTRAVENOUS | Status: DC
  Administered 2016-11-28: 08:00:00 via INTRAVENOUS

## 2016-11-28 MED ORDER — DULOXETINE HCL 30 MG PO CPEP: 60.0000 mg | ORAL_CAPSULE | Freq: Every day | ORAL | Status: DC | PRN

## 2016-11-28 MED ORDER — LAMOTRIGINE 25 MG PO TABS
150.0000 mg | ORAL_TABLET | Freq: Two times a day (BID) | ORAL | Status: DC
  Filled 2016-11-28: qty 2

## 2016-11-28 MED ORDER — ONDANSETRON HCL 4 MG PO TABS
8.0000 mg | ORAL_TABLET | Freq: Three times a day (TID) | ORAL | Status: DC | PRN
  Administered 2016-11-29: 8 mg via ORAL
  Filled 2016-11-28: qty 2

## 2016-11-28 MED ORDER — LAMOTRIGINE 100 MG PO TABS
150.0000 mg | ORAL_TABLET | Freq: Two times a day (BID) | ORAL | Status: DC
  Administered 2016-11-28 – 2016-11-29 (×3): 150 mg via ORAL
  Filled 2016-11-28 (×3): qty 2

## 2016-11-28 MED ORDER — ALBUTEROL SULFATE HFA 108 (90 BASE) MCG/ACT IN AERS: 2.0000 | INHALATION_SPRAY | Freq: Four times a day (QID) | RESPIRATORY_TRACT | Status: DC | PRN

## 2016-11-28 MED ORDER — TOPIRAMATE 100 MG PO TABS
100.0000 mg | ORAL_TABLET | Freq: Two times a day (BID) | ORAL | Status: DC
  Administered 2016-11-28 – 2016-11-29 (×3): 100 mg via ORAL
  Filled 2016-11-28 (×3): qty 1

## 2016-11-28 MED ORDER — ESTRADIOL 1 MG PO TABS
1.0000 mg | ORAL_TABLET | Freq: Every day | ORAL | Status: DC
  Administered 2016-11-28 – 2016-11-29 (×2): 1 mg via ORAL
  Filled 2016-11-28 (×2): qty 1

## 2016-11-28 MED ORDER — ALBUTEROL SULFATE (2.5 MG/3ML) 0.083% IN NEBU: 2.5000 mg | INHALATION_SOLUTION | Freq: Four times a day (QID) | RESPIRATORY_TRACT | Status: DC | PRN

## 2016-11-28 NOTE — Progress Notes (Signed)
Ltm started; nurse, patient, and husband educated on event button. Bedside commode requested by EEG tech to nurse. Dr Deborah Chalk notified.

## 2016-11-28 NOTE — Consult Note (Signed)
NEURO HOSPITALIST CONSULT NOTE   Requestig physician: Dr. Wynelle Cleveland   Reason for Consult: seizure like episodes   History obtained from:  Patient    HPI:                                                                                                                                          [name] [name] is an 51 y.o. female [name] [name] is an 51 y.o. female with known seizure history. Yesterday, Patient was seen by her nurse practitioner at her primary neurologist office. While she was there she had explained that she had had an increase in seizure activity over the last 2 weeks. 2 recent seizures that occurred during her sleep. Husband says that it wakes her as she says she is moving her extremities and will come up. She claims that she is having increased seizure activity during the day. While in the office apparently she had 2 staring spells and was sent to the emergency department for further evaluation. Due to the increase in seizure activity the plan was to increase patient's Lamictal to 150 twice a day as she was on 100 twice a day prior.  During my consultation in the ED yesterday  patient had a episode in which she was flailing her arms independently as if she was swimming shaking her head from side to side throwing her legs again and a kicking-like motion and flailing. I quickly took a ammonia tablet out of my pocket. Under patient's nose and she would quickly try to avoid the ammonia tablet to the point where she actually sat up at the bed and tried to get out of the bed and put her feet on the floor. She also had a episode very much like this prior to me coming into the room apparently with the nurse and was talking through the whole episode.  Patient was instructed [facility] if this continues so that she may get LTM. Today she returned to [facility] Her husband brought her in again as she had another episode in her sleep. When I entered her room she was having  another one. Per Dr. Wynelle Cleveland " I witnessed her rolling in the stretcher, holding her head and moaning."  Currently AO.   Past Medical History:  Diagnosis Date  . Anginal pain ([place])   . Anxiety   . Asthma   . Atrial fibrillation ([place])   . Back pain   . Back pain    bulging disk  . Chest pain   . Epilepsy ([place])   . Fibroid   . GERD (gastroesophageal reflux disease)   . Head injury   . Joint pain   . Migraine   . Obesity, Class III, BMI 40-49.9 (morbid obesity) ([place]) 12/21/2012  .  Ovarian cyst   . Prediabetes   . Seizures ([place])   . Shortness of breath dyspnea   . Surgical menopause   . TBI (traumatic brain injury) ([place])   . TIA (transient ischemic attack)   . Vitamin D deficiency     Past Surgical History:  Procedure Laterality Date  . ABDOMINAL HYSTERECTOMY     wiht bso  . CARDIAC CATHETERIZATION N/A 01/21/2015   Procedure: Left Heart Cath;  Surgeon: Yolonda Kida, MD;  Location: [place] [facility];  Service: Cardiovascular;  Laterality: N/A;  . CESAREAN SECTION    . COLONOSCOPY N/A 11/21/2012   Procedure: COLONOSCOPY;  Surgeon: Irene Shipper, MD;  Location: [facility];  Service: Endoscopy;  Laterality: N/A;  . DILATION AND CURETTAGE OF UTERUS    . ESOPHAGOGASTRODUODENOSCOPY N/A 11/21/2012   Procedure: ESOPHAGOGASTRODUODENOSCOPY (EGD);  Surgeon: Irene Shipper, MD;  Location: [facility];  Service: Endoscopy;  Laterality: N/A;  . HERNIA REPAIR  12/17/12   laparoscopic incisional   . INCISIONAL HERNIA REPAIR N/A 12/17/2012   Procedure: LAPAROSCOPIC INCISIONAL HERNIA;  Surgeon: Harl Bowie, MD;  Location: [place];  Service: General;  Laterality: N/A;  . INSERTION OF MESH N/A 12/17/2012   Procedure: INSERTION OF MESH;  Surgeon: Harl Bowie, MD;  Location: [place];  Service: General;  Laterality: N/A;    Family History  Problem Relation Age of Onset  . Hypertension Mother   . Depression Mother   . Heart disease Mother     deceased 2014/02/16  . Heart attack  Father   . Hypertension Father   . Emphysema Father   . COPD Father   . Heart disease Father   . Diabetes Father   . Breast cancer Cousin   . Cancer Neg Hx   . Colon cancer Neg Hx   . Esophageal cancer Neg Hx   . Stomach cancer Neg Hx   . Pancreatic cancer Neg Hx   . Liver disease Neg Hx       Social History:  reports that she has never smoked. She has never used smokeless tobacco. She reports that she drinks alcohol. She reports that she does not use drugs.  Allergies  Allergen Reactions  . Amoxicillin Anaphylaxis, Hives and Other (See Comments)    Has patient had a PCN reaction causing immediate rash, facial/tongue/throat swelling, SOB or lightheadedness with hypotension: Yes Has patient had a PCN reaction causing severe rash involving mucus membranes or skin necrosis: No Has patient had a PCN reaction that required hospitalization No Has patient had a PCN reaction occurring within the last 10 years: No If all of the above answers are "NO", then may proceed with Cephalosporin use.  Marland Kitchen Penicillins Anaphylaxis, Hives and Other (See Comments)    Has patient had a PCN reaction causing immediate rash, facial/tongue/throat swelling, SOB or lightheadedness with hypotension: Yes Has patient had a PCN reaction causing severe rash involving mucus membranes or skin necrosis: No Has patient had a PCN reaction that required hospitalization No Has patient had a PCN reaction occurring within the last 10 years: No If all of the above answers are "NO", then may proceed with Cephalosporin use.  . Biaxin [Clarithromycin] Hives  . Erythromycin Base Hives  . Zithromax [Azithromycin] Hives and Nausea Only    MEDICATIONS:  Scheduled: . enoxaparin (LOVENOX) injection  40 mg Subcutaneous Q24H  . estradiol  1 mg Oral Daily  . gabapentin  300 mg Oral QHS  . insulin aspart  0-9 Units  Subcutaneous TID WC  . lamoTRIgine  150 mg Oral BID  . metoprolol tartrate  25 mg Oral BID  . topiramate  100 mg Oral BID     ROS:                                                                                                                                       History obtained from the patient  General ROS: negative for - chills, fatigue, fever, night sweats, weight gain or weight loss Psychological ROS: negative for - behavioral disorder, hallucinations, memory difficulties, mood swings or suicidal ideation Ophthalmic ROS: negative for - blurry vision, double vision, eye pain or loss of vision ENT ROS: negative for - epistaxis, nasal discharge, oral lesions, sore throat, tinnitus or vertigo Allergy and Immunology ROS: negative for - hives or itchy/watery eyes Hematological and Lymphatic ROS: negative for - bleeding problems, bruising or swollen lymph nodes Endocrine ROS: negative for - galactorrhea, hair pattern changes, polydipsia/polyuria or temperature intolerance Respiratory ROS: negative for - cough, hemoptysis, shortness of breath or wheezing Cardiovascular ROS: negative for - chest pain, dyspnea on exertion, edema or irregular heartbeat Gastrointestinal ROS: negative for - abdominal pain, diarrhea, hematemesis, nausea/vomiting or stool incontinence Genito-Urinary ROS: negative for - dysuria, hematuria, incontinence or urinary frequency/urgency Musculoskeletal ROS: negative for - joint swelling or muscular weakness Neurological ROS: as noted in HPI Dermatological ROS: negative for rash and skin lesion changes   Blood pressure 135/71, pulse (!) 56, temperature 98 F (36.7 C), temperature source Oral, resp. rate 16, SpO2 100 %.   Neurologic Examination:                                                                                                      HEENT-  Normocephalic, no lesions, without obvious abnormality.  Normal external eye and conjunctiva.  Normal TM's  bilaterally.  Normal auditory canals and external ears. Normal external nose, mucus membranes and septum.  Normal pharynx. Cardiovascular- regular rate and rhythm, pulses palpable throughout   Lungs- chest clear, no wheezing, rales, normal symmetric air entry Abdomen- normal findings: bowel sounds normal Extremities- no edema Lymph-no adenopathy palpable Musculoskeletal-no joint tenderness, deformity or swelling Skin-warm and dry, no hyperpigmentation, vitiligo, or suspicious lesions  Neurological Examination Mental Status: Alert, oriented, thought content appropriate.  Speech fluent without evidence of aphasia.  Able to follow 3 step commands without difficulty. Cranial Nerves: II: Discs flat bilaterally; Visual fields grossly normal, pupils equal, round, reactive to light and accommodation III,IV, VI: ptosis not present, extra-ocular motions intact bilaterally V,VII: smile symmetric, facial light touch sensation normal bilaterally VIII: hearing normal bilaterally IX,X: uvula rises symmetrically XI: bilateral shoulder shrug XII: midline tongue extension Motor: Right : Upper extremity   5/5    Left:     Upper extremity   5/5  Lower extremity   5/5     Lower extremity   5/5 Tone and bulk:normal tone throughout; no atrophy noted Sensory: Pinprick and light touch intact throughout, bilaterally Deep Tendon Reflexes: 2+ and symmetric throughout Plantars: Right: downgoing   Left: downgoing Cerebellar: normal finger-to-nose, normal rapid alternating movements and normal heel-to-shin test Gait: not tested      Lab Results: Basic Metabolic Panel:  Recent Labs Lab 11/23/16 2030 11/27/16 1409 11/27/16 1423 11/28/16 0809 11/28/16 1210  NA 139 140 144 145  --   K 4.1 3.7 3.6 3.7  --   CL 110 111 112* 111  --   CO2 21* 20*  --   --   --   GLUCOSE 104* 83 82 98  --   BUN 14 11 10 10   --   CREATININE 0.78 0.90 1.00 0.90 0.81  CALCIUM 9.1 8.5*  --   --   --     Liver Function  Tests:  Recent Labs Lab 11/23/16 2030 11/27/16 1409  AST 16 18  ALT 18 21  ALKPHOS 104 96  BILITOT 0.6 0.3  PROT 7.4 6.6  ALBUMIN 3.9 3.5    Recent Labs Lab 11/23/16 2030  LIPASE 11   No results for input(s): AMMONIA in the last 168 hours.  CBC:  Recent Labs Lab 11/23/16 2030 11/27/16 1409 11/27/16 1423 11/28/16 0809 11/28/16 1210  WBC 7.0 5.2  --   --  5.7  NEUTROABS  --  2.4  --   --   --   HGB 13.6 12.1 12.6 12.6 11.8*  HCT 41.0 37.3 37.0 37.0 35.2*  MCV 81.2 81.1  --   --  78.6  PLT 172 164  --   --  167    Cardiac Enzymes: No results for input(s): CKTOTAL, CKMB, CKMBINDEX, TROPONINI in the last 168 hours.  Lipid Panel: No results for input(s): CHOL, TRIG, HDL, CHOLHDL, VLDL, LDLCALC in the last 168 hours.  CBG:  Recent Labs Lab 11/28/16 0731 11/28/16 1212  GLUCAP 79 30    Microbiology: Results for orders placed or performed during the hospital encounter of 09/05/14  Urine culture     Status: None   Collection Time: 09/05/14 12:21 PM  Result Value Ref Range Status   Specimen Description URINE, CLEAN CATCH  Final   Special Requests NONE  Final   Colony Count   Final    20,OOO COLONIES/ML Performed at [facility]    Culture   Final    Multiple bacterial morphotypes present, none predominant. Suggest appropriate recollection if clinically indicated. Performed at [facility]    Report Status 09/07/2014 FINAL  Final  Blood culture (routine x 2)     Status: None   Collection Time: 09/05/14 12:30 PM  Result Value Ref Range Status   Specimen Description BLOOD LEFT ARM  Final   Special Requests BOTTLES DRAWN AEROBIC AND ANAEROBIC [facility] EACH  Final   Culture   Final  NO GROWTH 5 DAYS Performed at [facility]    Report Status 09/11/2014 FINAL  Final  Blood culture (routine x 2)     Status: None   Collection Time: 09/05/14  1:30 PM  Result Value Ref Range Status   Specimen Description BLOOD RIGHT ANTECUBITAL  Final    Special Requests BOTTLES DRAWN AEROBIC AND ANAEROBIC 5ML  Final   Culture   Final    NO GROWTH 5 DAYS Performed at [facility]    Report Status 09/11/2014 FINAL  Final    Coagulation Studies: No results for input(s): LABPROT, INR in the last 72 hours.  Imaging: Ct Head Wo Contrast  Result Date: 11/27/2016 CLINICAL DATA:  Seizure today.  Headache. EXAM: CT HEAD WITHOUT CONTRAST TECHNIQUE: Contiguous axial images were obtained from the base of the skull through the vertex without intravenous contrast. COMPARISON:  Brain MRI and head CT scan 02/05/2016. FINDINGS: Brain: The brain appears normal without hemorrhage, infarct, mass lesion, mass effect, midline shift or abnormal extra-axial fluid collection. No hydrocephalus or pneumocephalus. Vascular: Negative. Skull: Intact. 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 Sinuses/Orbits: Negative. Other: None. IMPRESSION: Negative head CT. Electronically Signed   By: Inge Rise M.D.   On: 11/27/2016 14:10       Assessment and plan per attending neurologist  [name] PA-C [facility] [phone]  11/28/2016, 3:03 PM   Assessment/Plan:  This 51 year old female with known seizure history and no increased frequency of seizures. 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 If the episodes continued patient was instructed to go to [facility] after receiving a bolus of Keppra and increasing her Lamictal. Apparently patient had another episode last night with her husband and she was brought to the emergency department at [facility]. At this point patient will be admitted to [facility] and a 24-hour EEG with video will be obtained.  At this point episodes may be characterized as seizure versus pseudoseizure. If patient has no further seizures or no episodes overnight during  24-hour period would recommend patient go to [facility] for LTM as [facility] does not have the capabilities to do prolonged EEGs.

## 2016-11-28 NOTE — H&P (Addendum)
History and Physical    [name] [name] PJK:[id number] DOB: 11-02-1965 DOA: 11/28/2016    PCP: [name], MD  Patient coming from: home  Chief Complaint: agitation episodes  HPI: [name] [name] is a 51 y.o. female with medical history significant of seizure disorder, A-fib, TBI, Asthma how is brought in by her husband for spells which he feels may be seizures but are different from her usual seizures. They have been more frequent over the past 2 wks.. She was in the neurologist's [facility] Neuro) yesterday and had 2 staring spell and was sent to the ER. In the ER she had spells with flailing of extremities which appeared to be pseudoseizures. She was given a Keppra bolus and Lamictal was increased and she was discharged. Her husband brought her in again as she had another episode in her sleep. When I entered her room she was having another one. I witnessed her rolling in the stretcher, holding her head and moaning. It resolved in about 1 min. She subsequently was not responding to me and appeared to be asleep. No loss of bowel or bladder continence. Pupils reactive.  ER has spoken with Neuro and she is to be admitted to [facility] to continuous EEG.     She was seen in there ER for NVD last week. Received Phenergan in the ER. Husband does not know if she had new prescriptions. Phenergan is noted on her med list but it was prescribed in 2017 and is not a new medication. She has recovered from her GI issues and has been eating well lately.   No recent stressors that the husband knows of. She has been going to work as usual. She takes her own medications and he thinks she is taking them appropriately. No changes in medication that he knows of.   ED Course: normal Vital signs- head CT without contrast yesterday was normal.   Review of Systems:  All other systems reviewed and apart from HPI, are negative.  Past Medical History:  Diagnosis Date  . Anginal pain ([place])   . Anxiety   . Asthma     . Atrial fibrillation ([place])   . Back pain   . Back pain    bulging disk  . Chest pain   . Epilepsy ([place])   . Fibroid   . GERD (gastroesophageal reflux disease)   . Head injury   . Joint pain   . Migraine   . Obesity, Class III, BMI 40-49.9 (morbid obesity) ([place]) 12/21/2012  . Ovarian cyst   . Prediabetes   . Seizures ([place])   . Shortness of breath dyspnea   . Surgical menopause   . TBI (traumatic brain injury) ([place])   . TIA (transient ischemic attack)   . Vitamin D deficiency     Past Surgical History:  Procedure Laterality Date  . ABDOMINAL HYSTERECTOMY     wiht bso  . CARDIAC CATHETERIZATION N/A 01/21/2015   Procedure: Left Heart Cath;  Surgeon: Yolonda Kida, MD;  Location: [facility];  Service: Cardiovascular;  Laterality: N/A;  . CESAREAN SECTION    . COLONOSCOPY N/A 11/21/2012   Procedure: COLONOSCOPY;  Surgeon: Irene Shipper, MD;  Location: [facility];  Service: Endoscopy;  Laterality: N/A;  . DILATION AND CURETTAGE OF UTERUS    . ESOPHAGOGASTRODUODENOSCOPY N/A 11/21/2012   Procedure: ESOPHAGOGASTRODUODENOSCOPY (EGD);  Surgeon: Irene Shipper, MD;  Location: [facility];  Service: Endoscopy;  Laterality: N/A;  . HERNIA REPAIR  12/17/12  laparoscopic incisional   . INCISIONAL HERNIA REPAIR N/A 12/17/2012   Procedure: LAPAROSCOPIC INCISIONAL HERNIA;  Surgeon: Harl Bowie, MD;  Location: [facility];  Service: General;  Laterality: N/A;  . INSERTION OF MESH N/A 12/17/2012   Procedure: INSERTION OF MESH;  Surgeon: Harl Bowie, MD;  Location: [facility];  Service: General;  Laterality: N/A;    Social History:   reports that she has never smoked. She has never used smokeless tobacco. She reports that she drinks alcohol. She reports that she does not use drugs.  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   Allergies  Allergen Reactions  . Amoxicillin Anaphylaxis, Hives and Other (See Comments)    Has patient had a PCN reaction causing  immediate rash, facial/tongue/throat swelling, SOB or lightheadedness with hypotension: Yes Has patient had a PCN reaction causing severe rash involving mucus membranes or skin necrosis: No Has patient had a PCN reaction that required hospitalization No Has patient had a PCN reaction occurring within the last 10 years: No If all of the above answers are "NO", then may proceed with Cephalosporin use.  Marland Kitchen Penicillins Anaphylaxis, Hives and Other (See Comments)    Has patient had a PCN reaction causing immediate rash, facial/tongue/throat swelling, SOB or lightheadedness with hypotension: Yes Has patient had a PCN reaction causing severe rash involving mucus membranes or skin necrosis: No Has patient had a PCN reaction that required hospitalization No Has patient had a PCN reaction occurring within the last 10 years: No If all of the above answers are "NO", then may proceed with Cephalosporin use.  . Biaxin [Clarithromycin] Hives  . Erythromycin Base Hives  . Zithromax [Azithromycin] Hives and Nausea Only    Family History  Problem Relation Age of Onset  . Hypertension Mother   . Depression Mother   . Heart disease Mother     deceased February 17, 2014  . Heart attack Father   . Hypertension Father   . Emphysema Father   . COPD Father   . Heart disease Father   . Diabetes Father   . Breast cancer Cousin   . Cancer Neg Hx   . Colon cancer Neg Hx   . Esophageal cancer Neg Hx   . Stomach cancer Neg Hx   . Pancreatic cancer Neg Hx   . Liver disease Neg Hx      Prior to Admission medications   Medication Sig Start Date End Date Taking? Authorizing Provider  albuterol (PROVENTIL HFA;VENTOLIN HFA) 108 (90 BASE) MCG/ACT inhaler Inhale 2 puffs into the lungs every 6 (six) hours as needed for wheezing or shortness of breath.    Yes Historical Provider, MD  albuterol (PROVENTIL) (2.5 MG/3ML) 0.083% nebulizer solution Take 2.5 mg by nebulization every 6 (six) hours as needed for wheezing or shortness  of breath.   Yes Historical Provider, MD  clonazePAM (KLONOPIN) 1 MG tablet Take 1 mg by mouth daily as needed (at onset of a seizure).    Yes Historical Provider, MD  dihydroergotamine (MIGRANAL) 4 MG/ML nasal spray Place 1 spray into the nose as needed for migraine. Use in one nostril as directed.  No more than 4 sprays in one hour 07/23/16  Yes Larey Seat, MD  DULoxetine (CYMBALTA) 30 MG capsule Take 60 mg by mouth daily as needed (for back pain).    Yes Historical Provider, MD  estradiol (ESTRACE) 1 MG tablet Take 1 tablet (1 mg total) by mouth daily. 11/09/16  Yes Brayton Mars, MD  gabapentin (NEURONTIN) 300 MG capsule Take 300 mg by mouth at bedtime.    Yes Historical Provider, MD  lamoTRIgine (LAMICTAL) 150 MG tablet Take 1 tablet (150 mg total) by mouth 2 (two) times daily. 11/27/16  Yes Dennie Bible, NP  meclizine (ANTIVERT) 25 MG tablet Take 25 mg by mouth 3 (three) times daily as needed for dizziness.    Yes Historical Provider, MD  metFORMIN (GLUCOPHAGE) 500 MG tablet Take 1 tablet (500 mg total) by mouth daily with breakfast. 11/26/16  Yes Caren D Leafy Ro, MD  metoprolol tartrate (LOPRESSOR) 25 MG tablet Take 1 tablet (25 mg total) by mouth 2 (two) times daily. Patient taking differently: Take 25 mg by mouth daily.  04/25/15  Yes Domenic Polite, MD  naproxen sodium (ALEVE) 220 MG tablet Take 440 mg by mouth 2 (two) times daily as needed (for pain).    Yes Historical Provider, MD  ondansetron (ZOFRAN) 8 MG tablet Take 1 tablet (8 mg total) by mouth every 8 (eight) hours as needed for nausea. 11/23/16  Yes Veryl Speak, MD  promethazine (PHENERGAN) 50 MG tablet Take 0.5 tablets (25 mg total) by mouth every 6 (six) hours as needed for nausea or vomiting. 07/23/16  Yes Carmen Dohmeier, MD  ranitidine (ZANTAC) 300 MG tablet Take 300 mg by mouth daily as needed for heartburn.    Yes Historical Provider, MD  topiramate (TOPAMAX) 100 MG tablet Take 1 tablet (100 mg total) by mouth 2  (two) times daily. 07/23/16  Yes Carmen Dohmeier, MD  Vitamin D, Ergocalciferol, (DRISDOL) 50000 units CAPS capsule Take 1 capsule (50,000 Units total) by mouth every Tuesday. 11/27/16  Yes Caren D Leafy Ro, MD  HYDROcodone-acetaminophen (NORCO) 5-325 MG tablet Take 1-2 tablets by mouth every 6 (six) hours as needed. Patient not taking: Reported on 11/28/2016 11/23/16   Veryl Speak, MD    Physical Exam: Vitals:   11/28/16 0830 11/28/16 0900 11/28/16 0930 11/28/16 1000  BP: 119/63 128/68 129/73 (!) 119/58  Pulse: 65 67 68 63  Resp: 17     Temp:      TempSrc:      SpO2: 98% 99% 98% 96%      Constitutional: agitated as mentioned in HPI- non-verbal Eyes: PERTLA, lids and conjunctivae normal ENMT: unable to assess oral mucosa Neck: normal, supple, no masses, no thyromegaly Respiratory: clear to auscultation bilaterally, no wheezing, no crackles. Normal respiratory effort. No accessory muscle use.  Cardiovascular: S1 & S2 heard, regular rate and rhythm, no murmurs / rubs / gallops. No extremity edema. 2+ pedal pulses. No carotid bruits.  Abdomen: No distension, no tenderness, no masses palpated. No hepatosplenomegaly. Bowel sounds normal.  Musculoskeletal: no clubbing / cyanosis. No joint deformity upper and lower extremities. Good ROM, no contractures. Normal muscle tone.  Skin: no rashes, lesions, ulcers. No induration Neurologic: CN 2-12 grossly intact. Moves all 4 limbs- does not follow commands at this time Psychiatric: unable to assess     Labs on Admission: I have personally reviewed following labs and imaging studies  CBC:  Recent Labs Lab 11/23/16 2030 11/27/16 1409 11/27/16 1423 11/28/16 0809  WBC 7.0 5.2  --   --   NEUTROABS  --  2.4  --   --   HGB 13.6 12.1 12.6 12.6  HCT 41.0 37.3 37.0 37.0  MCV 81.2 81.1  --   --   PLT 172 164  --   --    Basic Metabolic Panel:  Recent Labs Lab 11/23/16 2030 11/27/16  1409 11/27/16 1423 11/28/16 0809  NA 139 140 144 145  K  4.1 3.7 3.6 3.7  CL 110 111 112* 111  CO2 21* 20*  --   --   GLUCOSE 104* 83 82 98  BUN 14 11 10 10   CREATININE 0.78 0.90 1.00 0.90  CALCIUM 9.1 8.5*  --   --    GFR: Estimated Creatinine Clearance: 96.2 mL/min (by C-G formula based on SCr of 0.9 mg/dL). Liver Function Tests:  Recent Labs Lab 11/23/16 2030 11/27/16 1409  AST 16 18  ALT 18 21  ALKPHOS 104 96  BILITOT 0.6 0.3  PROT 7.4 6.6  ALBUMIN 3.9 3.5    Recent Labs Lab 11/23/16 2030  LIPASE 11   No results for input(s): AMMONIA in the last 168 hours. Coagulation Profile: No results for input(s): INR, PROTIME in the last 168 hours. Cardiac Enzymes: No results for input(s): CKTOTAL, CKMB, CKMBINDEX, TROPONINI in the last 168 hours. BNP (last 3 results) No results for input(s): PROBNP in the last 8760 hours. HbA1C: No results for input(s): HGBA1C in the last 72 hours. CBG:  Recent Labs Lab 11/28/16 0731  GLUCAP 79   Lipid Profile: No results for input(s): CHOL, HDL, LDLCALC, TRIG, CHOLHDL, LDLDIRECT in the last 72 hours. Thyroid Function Tests: No results for input(s): TSH, T4TOTAL, FREET4, T3FREE, THYROIDAB in the last 72 hours. Anemia Panel: No results for input(s): VITAMINB12, FOLATE, FERRITIN, TIBC, IRON, RETICCTPCT in the last 72 hours. Urine analysis:    Component Value Date/Time   COLORURINE YELLOW 11/23/2016 2031   APPEARANCEUR CLEAR 11/23/2016 2031   APPEARANCEUR Clear 09/26/2014 2056   LABSPEC 1.020 11/23/2016 2031   LABSPEC 1.006 09/26/2014 2056   PHURINE 5.0 11/23/2016 2031   GLUCOSEU NEGATIVE 11/23/2016 2031   GLUCOSEU Negative 09/26/2014 2056   HGBUR SMALL (A) 11/23/2016 2031   BILIRUBINUR NEGATIVE 11/23/2016 2031   BILIRUBINUR Negative 09/26/2014 2056   KETONESUR NEGATIVE 11/23/2016 2031   PROTEINUR NEGATIVE 11/23/2016 2031   UROBILINOGEN 0.2 04/24/2015 0056   NITRITE NEGATIVE 11/23/2016 2031   LEUKOCYTESUR NEGATIVE 11/23/2016 2031   LEUKOCYTESUR Negative 09/26/2014 2056   Sepsis  Labs: @LABRCNTIP (procalcitonin:4,lacticidven:4) )No results found for this or any previous visit (from the past 240 hour(s)).   Radiological Exams on Admission: Ct Head Wo Contrast  Result Date: 11/27/2016 CLINICAL DATA:  Seizure today.  Headache. EXAM: CT HEAD WITHOUT CONTRAST TECHNIQUE: Contiguous axial images were obtained from the base of the skull through the vertex without intravenous contrast. COMPARISON:  Brain MRI and head CT scan 02/05/2016. FINDINGS: Brain: The brain appears normal without hemorrhage, infarct, mass lesion, mass effect, midline shift or abnormal extra-axial fluid collection. No hydrocephalus or pneumocephalus. Vascular: Negative. Skull: Intact. 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 Sinuses/Orbits: Negative. Other: None. IMPRESSION: Negative head CT. Electronically Signed   By: Inge Rise M.D.   On: 11/27/2016 14:10    EKG: pending.   Assessment/Plan Principal Problem:   Agitation spells - the episode I witnessed was not a seizure but she did have a couple of staring spells in the doctor's office yesterday - admit to [facility] for continuous EEG - attempt to continue to find underlying cause of these episodes- once patient is more coherent, may be able to obtain a better history- may need psych eval - NPO, IVF  Active Problems:   Partial symptomatic epilepsy with complex partial seizures, not intractable, without status epilepticus - Neuro to assess- cont meds  Chronic headaches - cont home meds- Topamax, Lopressor    Atrial fibrillation - noted on chart - on Lopressor but not on anticoagulation - HR sounds regular on exam - check EKG- Addendum - EKG shows NSR    Asthma, mild intermittent - resume inhalers, nebs  Prediabetes - ISS, hold Metformin    Back pain at L4-L5 level - home med list mentions Cymbalta- will resume    Surgical menopause on hormone replacement therapy Cont estradiol     Vitamin D deficiency     DVT prophylaxis: Lovenox Code Status: Full code  Family Communication: husband at bedside  Disposition Plan:   Consults called: Neuro  Admission status: inpatient    [facility] MD [facility] Pager: [email or web address] Password [email or web address] 7PM-7AM, please contact night-coverage   11/28/2016, 10:57 AM

## 2016-11-28 NOTE — ED Triage Notes (Signed)
BIB EMS from Home, pts husband believes pt experienced seizure while in bed. Duration of seizure unknown. Pt post-ictal upon EMS arrival. Pt c/o neck, back, and head pain. Pt ambulatory to bathroom upon arrival here.    EMS Vitals BP 122/95 P 100 CBG 105

## 2016-11-28 NOTE — ED Notes (Signed)
Bed: IZ12 Expected date:  Expected time:  Means of arrival:  Comments: EMS "possible seizure"

## 2016-11-28 NOTE — Progress Notes (Signed)
Patient arrived to unit by [facility].  Patient able to transfer self from stretcher to bed.  Patient denies pain or discomfort.  Alert and oriented.  Telemetry applied and verified.  No family at bedside at this time.

## 2016-11-28 NOTE — ED Provider Notes (Signed)
[facility] DEPT Provider Note   CSN: [id number] Arrival date & time: 11/28/16  8416     History   Chief Complaint Chief Complaint  Patient presents with  . Seizures    HPI [name] [name] is a 51 y.o. female.  HPI Patient presents to the emergency room for evaluation of possible seizure. Patient has a history of traumatic brain injury as well as a seizure disorder. Patient states she has history of seizures since childhood however in the last several weeks she has had an escalation of her seizures has been having them frequently. She was in her neurologist office yesterday and apparently had seizure-like activity in the office. She was sent to the [facility] emergency room. Patient was evaluated in the emergency room and seen by the neurologist on-call.  Patient had witnessed spells in the emergency department.  The vent appeared to be more consistent with pseudoseizure activity is post seizures. Patient was loaded with 1000 mg of Keppra in the emergency room the plan was to increase her Lamictal to 150 mg twice a day. Patient states this morning her husband called EMS when he witnessed similar seizure-like activity while she was sleeping. Patient does not recall the specific event.  She just remembers waking up and seeing the EMS personnel.  She denies any headaches. No fevers. No vomiting or diarrhea. No other complaints. Past Medical History:  Diagnosis Date  . Anginal pain ([place])   . Anxiety   . Asthma   . Atrial fibrillation ([place])   . Back pain   . Back pain    bulging disk  . Chest pain   . Epilepsy ([place])   . Fibroid   . GERD (gastroesophageal reflux disease)   . Head injury   . Joint pain   . Migraine   . Obesity, Class III, BMI 40-49.9 (morbid obesity) ([place]) 12/21/2012  . Ovarian cyst   . Prediabetes   . Seizures ([place])   . Shortness of breath dyspnea   . Surgical menopause   . TBI (traumatic brain injury) ([place])   . TIA (transient ischemic attack)   . Vitamin D  deficiency     Patient Active Problem List   Diagnosis Date Noted  . Prediabetes 11/26/2016  . Vitamin D deficiency 11/26/2016  . Surgical menopause on hormone replacement therapy 09/08/2015  . Incontinence in female 09/08/2015  . Back pain at L4-L5 level 09/07/2015  . DDD (degenerative disc disease), lumbosacral 09/07/2015  . Lumbosacral radiculopathy 09/07/2015  . Migraine with aura and with status migrainosus, not intractable 07/20/2015  . Subarachnoid hemorrhage following injury, with loss of consciousness ([place]) 07/20/2015  . TBI (traumatic brain injury) ([place]) 07/20/2015  . Partial symptomatic epilepsy with complex partial seizures, not intractable, without status epilepticus ([place]) 07/20/2015  . Intractable migraine with aura with status migrainosus 04/27/2015  . Chronic post-traumatic headache, not intractable 04/27/2015  . Morbid obesity ([place])   . TIA (transient ischemic attack) 04/25/2015  . Paresthesia 04/22/2015  . CVA (cerebral infarction) 04/22/2015  . Right sided weakness 04/22/2015  . Seizure disorder ([place]) 04/22/2015  . Chronic headaches 04/22/2015  . Atrial fibrillation ([place]) 04/22/2015  . Right arm weakness   . Asthma, mild intermittent   . History of recent fall 03/09/2014  . 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 Obesity, Class III, BMI 40-49.9 (morbid obesity) ([place]) 12/21/2012  . GERD (gastroesophageal reflux disease)   . Incisional hernia, without obstruction or gangrene 11/06/2012  . Status post TAH-BSO 10/09/2003  Past Surgical History:  Procedure Laterality Date  . ABDOMINAL HYSTERECTOMY     wiht bso  . CARDIAC CATHETERIZATION N/A 01/21/2015   Procedure: Left Heart Cath;  Surgeon: Yolonda Kida, MD;  Location: [facility];  Service: Cardiovascular;  Laterality: N/A;  . CESAREAN SECTION    . COLONOSCOPY N/A 11/21/2012   Procedure: COLONOSCOPY;  Surgeon: Irene Shipper, MD;  Location: [facility];  Service: Endoscopy;   Laterality: N/A;  . DILATION AND CURETTAGE OF UTERUS    . ESOPHAGOGASTRODUODENOSCOPY N/A 11/21/2012   Procedure: ESOPHAGOGASTRODUODENOSCOPY (EGD);  Surgeon: Irene Shipper, MD;  Location: [facility];  Service: Endoscopy;  Laterality: N/A;  . HERNIA REPAIR  12/17/12   laparoscopic incisional   . INCISIONAL HERNIA REPAIR N/A 12/17/2012   Procedure: LAPAROSCOPIC INCISIONAL HERNIA;  Surgeon: Harl Bowie, MD;  Location: [facility];  Service: General;  Laterality: N/A;  . INSERTION OF MESH N/A 12/17/2012   Procedure: INSERTION OF MESH;  Surgeon: Harl Bowie, MD;  Location: [facility];  Service: General;  Laterality: N/A;    OB History    Gravida Para Term Preterm AB Living   6 2 2   4 1    SAB TAB Ectopic Multiple Live Births   4       2       Home Medications    Prior to Admission medications   Medication Sig Start Date End Date Taking? Authorizing Provider  albuterol (PROVENTIL HFA;VENTOLIN HFA) 108 (90 BASE) MCG/ACT inhaler Inhale 2 puffs into the lungs every 6 (six) hours as needed for wheezing.    Historical Provider, MD  albuterol (PROVENTIL) (2.5 MG/3ML) 0.083% nebulizer solution Take 2.5 mg by nebulization every 6 (six) hours as needed for wheezing or shortness of breath.    Historical Provider, MD  clonazePAM (KLONOPIN) 1 MG tablet Take 1 mg by mouth daily as needed (onset of seizure).     Historical Provider, MD  dihydroergotamine (MIGRANAL) 4 MG/ML nasal spray Place 1 spray into the nose as needed for migraine. Use in one nostril as directed.  No more than 4 sprays in one hour Patient taking differently: Place 1 spray into the nose See admin instructions. Instil 1 spray into one nostril up to 4 times as needed for migraines (no more than 4 sprays in one hour) 07/23/16   Larey Seat, MD  DULoxetine (CYMBALTA) 30 MG capsule Take 60 mg by mouth daily.    Historical Provider, MD  estradiol (ESTRACE) 1 MG tablet Take 1 tablet (1 mg total) by mouth daily. 11/09/16   Alanda Slim  Defrancesco, MD  gabapentin (NEURONTIN) 300 MG capsule Take 300 mg by mouth at bedtime.  09/28/16 09/28/17  Historical Provider, MD  HYDROcodone-acetaminophen (NORCO) 5-325 MG tablet Take 1-2 tablets by mouth every 6 (six) hours as needed. 11/23/16   Veryl Speak, MD  lamoTRIgine (LAMICTAL) 150 MG tablet Take 1 tablet (150 mg total) by mouth 2 (two) times daily. 11/27/16   Dennie Bible, NP  meclizine (ANTIVERT) 25 MG tablet Take 25 mg by mouth 3 (three) times daily as needed for dizziness.  10/10/16   Historical Provider, MD  metFORMIN (GLUCOPHAGE) 500 MG tablet Take 1 tablet (500 mg total) by mouth daily with breakfast. 11/26/16   Caren Drucie Opitz, MD  metoprolol tartrate (LOPRESSOR) 25 MG tablet Take 1 tablet (25 mg total) by mouth 2 (two) times daily. Patient taking differently: Take 25 mg by mouth daily.  04/25/15   Domenic Polite,  MD  naproxen sodium (ALEVE) 220 MG tablet Take 440 mg by mouth daily as needed (pain).    Historical Provider, MD  ondansetron (ZOFRAN) 8 MG tablet Take 1 tablet (8 mg total) by mouth every 8 (eight) hours as needed for nausea. 11/23/16   Veryl Speak, MD  Prasterone (INTRAROSA) 6.5 MG INST Place 6.5 mg vaginally at bedtime. Patient not taking: Reported on 11/27/2016 10/24/16   Alanda Slim Defrancesco, MD  promethazine (PHENERGAN) 50 MG tablet Take 0.5 tablets (25 mg total) by mouth every 6 (six) hours as needed for nausea or vomiting. 07/23/16   Asencion Partridge Dohmeier, MD  ranitidine (ZANTAC) 300 MG tablet Take 300 mg by mouth daily as needed for heartburn (acid reflux).  11/15/16   Historical Provider, MD  topiramate (TOPAMAX) 100 MG tablet Take 1 tablet (100 mg total) by mouth 2 (two) times daily. 07/23/16   Asencion Partridge Dohmeier, MD  Vitamin D, Ergocalciferol, (DRISDOL) 50000 units CAPS capsule Take 1 capsule (50,000 Units total) by mouth every Tuesday. 11/27/16   Caren Drucie Opitz, MD    Family History Family History  Problem Relation Age of Onset  . Hypertension Mother   . Depression  Mother   . Heart disease Mother     deceased 01-27-14  . Heart attack Father   . Hypertension Father   . Emphysema Father   . COPD Father   . Heart disease Father   . Diabetes Father   . Breast cancer Cousin   . Cancer Neg Hx   . Colon cancer Neg Hx   . Esophageal cancer Neg Hx   . Stomach cancer Neg Hx   . Pancreatic cancer Neg Hx   . Liver disease Neg Hx     Social History Social History  Substance Use Topics  . Smoking status: Never Smoker  . Smokeless tobacco: Never Used  . Alcohol use 0.0 oz/week     Comment: rare     Allergies   Amoxicillin; Biaxin [clarithromycin]; Erythromycin base; Penicillins; and Zithromax [azithromycin]   Review of Systems Review of Systems  All other systems reviewed and are negative.    Physical Exam Updated Vital Signs BP 132/65   Pulse 89   Temp 98.1 F (36.7 C) (Oral)   Resp (!) 22   LMP  (LMP Unknown) Comment: 2005  SpO2 100%   Physical Exam  Constitutional: She appears well-developed and well-nourished. No distress.  HENT:  Head: Normocephalic and atraumatic.  Right Ear: External ear normal.  Left Ear: External ear normal.  Eyes: Conjunctivae are normal. Right eye exhibits no discharge. Left eye exhibits no discharge. No scleral icterus.  Neck: Neck supple. No tracheal deviation present.  Cardiovascular: Normal rate, regular rhythm and intact distal pulses.   Pulmonary/Chest: Effort normal and breath sounds normal. No stridor. No respiratory distress. She has no wheezes. She has no rales.  Abdominal: Soft. Bowel sounds are normal. She exhibits no distension. There is no tenderness. There is no rebound and no guarding.  Musculoskeletal: She exhibits no edema or tenderness.  Neurological: She is alert. She has normal strength. No cranial nerve deficit (no facial droop, extraocular movements intact, no slurred speech) or sensory deficit. She exhibits normal muscle tone. She displays no seizure activity. Coordination normal.    Skin: Skin is warm and dry. No rash noted.  Psychiatric: She has a normal mood and affect.  Nursing note and vitals reviewed.    ED Treatments / Results  Labs (all labs ordered are listed, but only  abnormal results are displayed) Labs Reviewed  CBG MONITORING, ED  I-STAT CHEM 8, ED    EKG  EKG Interpretation  Date/Time:  Wednesday November 28 2016 07:24:31 EDT Ventricular Rate:  64 PR Interval:    QRS Duration: 94 QT Interval:  427 QTC Calculation: 441 R Axis:   46 Text Interpretation:  Sinus rhythm No old tracing to compare Confirmed by Zabria Liss  MD-J, Zhoe Catania [phone]) on 11/28/2016 8:27:07 AM       Radiology Ct Head Wo Contrast  Result Date: 11/27/2016 CLINICAL DATA:  Seizure today.  Headache. EXAM: CT HEAD WITHOUT CONTRAST TECHNIQUE: Contiguous axial images were obtained from the base of the skull through the vertex without intravenous contrast. COMPARISON:  Brain MRI and head CT scan 02/05/2016. FINDINGS: Brain: The brain appears normal without hemorrhage, infarct, mass lesion, mass effect, midline shift or abnormal extra-axial fluid collection. No hydrocephalus or pneumocephalus. Vascular: Negative. Skull: Intact. 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 Sinuses/Orbits: Negative. Other: None. IMPRESSION: Negative head CT. Electronically Signed   By: Inge Rise M.D.   On: 11/27/2016 14:10    Procedures Procedures (including critical care time)  Medications Ordered in ED Medications  0.9 %  sodium chloride infusion ( Intravenous New Bag/Given 11/28/16 0806)     Initial Impression / Assessment and Plan / ED Course  I have reviewed the triage vital signs and the nursing notes.  Pertinent labs & imaging results that were available during my care of the patient were reviewed by me and considered in my medical decision making (see chart for details).  Clinical Course as of Nov 28 936  Wed Nov 28, 2016  0851 Reviewed  neurology evaluation results from yesterday.  Sx felt to be related to non epileptic seizure.  Pt would prefer to be admitted for further evaluation. The plan was for her to get her EEG today.  I will consult with her neurologist, [facility] neurology.  [JK]  1287 Trying to get in contact with neurology. Husband states pt had another episode where she was lying in bed and was unresponsive briefly. Would not respond to him.  Pt then came to after a short period of time and would respond.  [OM]  7672 Discussed case with neurology on call.  Unable to reach [facility] neurology. Recommends admission to [facility].  Neurology will plan on 24 hour LTM.  [JK]    Clinical Course User Index [JK] Dorie Rank, MD    Patient presents to the emergency room for recurrent seizure-like episodes. Patient does have history of seizure disorder associated with traumatic brain injury. Recently however she's had worsening episodes. She was seen by neurology yesterday in the emergency department and the episodes appeared to be nonepileptic seizures. Patient's husband states however she had another unresponsive episode this morning more suggestive of a seizure. she then had another episode in the emergency room that i did not witness. i spoke with neurology on-call. plan will be to admit her to [facility] for 24 hours holter monitoring. avoid any benzodiazepines. Final Clinical Impressions(s) / ED Diagnoses   Final diagnoses:  Pseudoseizure  Seizure-like activity ([place])      Dorie Rank, MD 11/28/16 [phone]

## 2016-11-29 ENCOUNTER — Encounter: Payer: Self-pay | Admitting: Neurology

## 2016-11-29 ENCOUNTER — Telehealth: Payer: Self-pay | Admitting: Neurology

## 2016-11-29 ENCOUNTER — Encounter: Payer: Self-pay | Admitting: Nurse Practitioner

## 2016-11-29 DIAGNOSIS — R569 Unspecified convulsions: Secondary | ICD-10-CM

## 2016-11-29 DIAGNOSIS — F445 Conversion disorder with seizures or convulsions: Secondary | ICD-10-CM | POA: Diagnosis present

## 2016-11-29 DIAGNOSIS — I480 Paroxysmal atrial fibrillation: Secondary | ICD-10-CM

## 2016-11-29 LAB — BASIC METABOLIC PANEL
ANION GAP: 6 (ref 5–15)
BUN: 9 mg/dL (ref 6–20)
CHLORIDE: 110 mmol/L (ref 101–111)
CO2: 24 mmol/L (ref 22–32)
Calcium: 8.7 mg/dL — ABNORMAL LOW (ref 8.9–10.3)
Creatinine, Ser: 0.95 mg/dL (ref 0.44–1.00)
GFR calc Af Amer: >60 mL/min (ref >60)
GFR calc non Af Amer: >60 mL/min (ref >60)
GLUCOSE: 102 mg/dL — AB (ref 65–99)
POTASSIUM: 3.7 mmol/L (ref 3.5–5.1)
Sodium: 140 mmol/L (ref 135–145)

## 2016-11-29 LAB — CBC
HEMATOCRIT: 37.9 % (ref 36.0–46.0)
HEMOGLOBIN: 12.4 g/dL (ref 12.0–15.0)
MCH: 26.7 pg (ref 26.0–34.0)
MCHC: 32.7 g/dL (ref 30.0–36.0)
MCV: 81.7 fL (ref 78.0–100.0)
Platelets: 181 10*3/uL (ref 150–400)
RBC: 4.64 MIL/uL (ref 3.87–5.11)
RDW: 15.2 % (ref 11.5–15.5)
WBC: 6 10*3/uL (ref 4.0–10.5)

## 2016-11-29 LAB — GLUCOSE, CAPILLARY
Glucose-Capillary: 95 mg/dL (ref 65–99)
Glucose-Capillary: 97 mg/dL (ref 65–99)

## 2016-11-29 MED ORDER — DIPHENHYDRAMINE HCL 50 MG/ML IJ SOLN
25.0000 mg | Freq: Once | INTRAMUSCULAR | Status: AC
  Administered 2016-11-29: 25 mg via INTRAVENOUS
  Filled 2016-11-29: qty 1

## 2016-11-29 MED ORDER — KETOROLAC TROMETHAMINE 30 MG/ML IJ SOLN
30.0000 mg | Freq: Once | INTRAMUSCULAR | Status: AC
  Administered 2016-11-29: 30 mg via INTRAVENOUS
  Filled 2016-11-29: qty 1

## 2016-11-29 MED ORDER — METOCLOPRAMIDE HCL 5 MG/ML IJ SOLN
10.0000 mg | Freq: Once | INTRAMUSCULAR | Status: AC
  Administered 2016-11-29: 10 mg via INTRAVENOUS
  Filled 2016-11-29: qty 2

## 2016-11-29 NOTE — Progress Notes (Signed)
11:45 I was called into patients room for active seizure. Patient in bed eyes deviated to the left and not moving. Ammonia placed under nose and she quickly lifted both arms, looked straight forward and started to push my hands away. EEG button was pushed during this event.   [name] PA-C [facility] [phone]  M-F  (8:30 am- 4 PM)  11/29/2016, 11:46 AM

## 2016-11-29 NOTE — Progress Notes (Signed)
Pt complained of headache, Nurse provided tylenol, pt refused due to ineffectiveness of tylenol per patient and spouse. Nurse was called back into pt's room 30 min later, spouse verbalize pt is having seizures, Nurse broke the ammonia and allowed pt to sniff content, pt slapped the nurses hand away from her face not able to tolerate the pungent smell. MD notified, Neurology team called to follow up on pt.

## 2016-11-29 NOTE — Progress Notes (Signed)
Patient spouse at bedside stated that pt was hallucinating and moving throwing her arms in bed around 0500  Said he pushed the seizure  monitory button but no staff  Was in  rn came pt was awake alert & O x 4  carrying on conversational with RN, no s/s of seizure noted . RN assisted pt to [facility] and back to bed  .EEG monitoring in progress .No active seizure activity noted this shift. Pt educated to increase po fluid intake as urine was very concentrated. Pt   also advice to call if notice any Aura before seizure. Seizure precaution maintained ,RN will continue to monitor pt.

## 2016-11-29 NOTE — Telephone Encounter (Signed)
I received a call from [name], [name] from [facility] stating that [name] [name] had been observed overnight after a possible seizure event. Lamictal dose has been increased. We feel that the patient needs to be seen in epilepsy monitoring unit for tertiary care center. Patient requested [facility]. I will make arrangements.

## 2016-11-29 NOTE — Progress Notes (Signed)
Subjective: Patient currently is resting currently in bed. Per husband she at one point during the night approximately 5 AM this morning yelled out" sharks" and started flailing her arms. It is unclear if they pushed the button at that time. She has had no further events overnight. She is complaining of a headache. She has had Tylenol but it has not helped too much.  Exam: Vitals:   11/29/16 0300 11/29/16 0919  BP: 117/76 (!) 125/57  Pulse: (!) 55 67  Resp: 18 20  Temp: 98 F (36.7 C) 98.7 F (37.1 C)      Neuro:  CN: Pupils are equal and round. They are symmetrically reactive from 3-->2 mm. EOMI without nystagmus. Facial sensation is intact to light touch. Face is symmetric at rest with normal strength and mobility. Hearing is intact to conversational voice. Palate elevates symmetrically and uvula is midline. Voice is normal in tone, pitch and quality. Bilateral SCM and trapezii are 5/5. Tongue is midline with normal bulk and mobility.  Motor: Normal bulk, tone, and strength. 5/5 throughout. No drift.  Sensation: Intact to light touch.  DTRs: 2+, symmetric  Toes downgoing bilaterally. No pathologic reflexes.  Coordination: Finger-to-nose and heel-to-shin are without dysmetria     Pertinent Labs/Diagnostics: Currently on prolonged EEG awaiting EEG reading. 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 At this time I had a long discussion with the family that we will leave the continuous EEG on for another half daily if no further events patient should be seen by her outpatient neurologist. If she does have any  further events after being discharged we would recommend that she be seen at [facility] as they have a LTM and there are tertiary center. I will discuss this with Dr. Brett Fairy if get a hold of her in hopes to get her an appointment at [facility] for a LTM evaluation. We'll make no changes with her medications at this time    11/29/2016, 9:27 AM

## 2016-11-29 NOTE — Care Management Note (Signed)
Case Management Note  Patient Details  Name: [name] [name] MRN: [id number] Date of Birth: 1965-11-25  Subjective/Objective:              Patient was admitted for episodes of agitation, placed on a continuous EEG monitor. Lives at home with spouse. CM will follow for discharge needs pending patient's progress and physician orders.    Action/Plan:   Expected Discharge Date:   (unknown)               Expected Discharge Plan:     In-House Referral:     Discharge planning Services     Post Acute Care Choice:    Choice offered to:     DME Arranged:    DME Agency:     HH Arranged:    HH Agency:     Status of Service:     If discussed at [employer] of Stay Meetings, dates discussed:    Additional Comments:  Rolm Baptise, RN 11/29/2016, 9:47 AM

## 2016-11-29 NOTE — Progress Notes (Signed)
LTM discontinued. No skin breakdown was seen. 

## 2016-11-29 NOTE — Procedures (Signed)
  Electroencephalogram report- LTM   Data acquisition: 10-20 electrode placement.  Additional T1, T2, and EKG electrodes; 26 channel digital referential acquisition reformatted to 18 channel/7 channel coronal bipolar     Beginning time: 11/28/16 at 4 37 11 pm Ending time: 11/29/16 at  07 55 34 am   Day of study: day 1    This 18  hours of intensive EEG monitoring with simultaneous video monitoring was performed for this patient with spells Of movements primarily during the night as family report increased" seizure activities at night "" to record events of interest and determine if these are seizures.  Medications:    There were 3 pushbutton activation events during this recording.  It is unclear whether this events were accidents or due to some type of event as a be due was extremely suboptimal and difficult to discern reason for event activation.  However with all of these events patient awake prior to event.  She is moving around or trying to get off the bed .  Electrographically this accompanied by essentially normal waking background activities with superimposed muscle and movement artifact.  There was no any suggestion of ictal pattern to suggest seizures.  Waking background activities marked by 8 cps posterior dominant symmetric synchronized waking rhythm  which tend to attenuates with eyes opening.  Patient had normal drowsiness and sleep architecture.  There was no interictal epileptiform discharges.  No clinical subclinical seizures present.  Clinical interpretation: This 18  hours of intensive EEG monitoring with simultaneous monitoring did not record any clinical or subclinical seizures.  Background activities were normal during wakefulness drowsiness and sleep throughout the recording.    3 pushbutton activation events were recorded.  Please see above discussion.  Etiology of this activation events are not clear ( sub optimal video/audio) this could be due to accidents;  Also  normal  waking background activities prior during and after all this events of interest suggestive this this events are not seizures.

## 2016-11-29 NOTE — Discharge Summary (Addendum)
Physician Discharge Summary  [name] [name] PZW:[id number] DOB: 05/31/1966 DOA: 11/28/2016  PCP: [name] Pink, MD  Admit date: 11/28/2016 Discharge date: 11/29/2016  Time spent: 60 minutes  Recommendations for Outpatient Follow-up:  1. Follow-up with primary neurologist, Dr. Brett [name] in 1-2 weeks.   Discharge Diagnoses:  Principal Problem:   Pseudoseizure Active Problems:   Seizure-like activity (HCC)   Chronic headaches   Atrial fibrillation (HCC)   Asthma, mild intermittent   Partial symptomatic epilepsy with complex partial seizures, not intractable, without status epilepticus (HCC)   Back pain at L4-L5 level   Surgical menopause on hormone replacement therapy   Vitamin D deficiency   Agitation   Discharge Condition: Stable  Diet recommendation: Heart healthy  There were no vitals filed for this visit.  History of present illness:  Per Dr. Wynelle [name] 11/28/2016   [name] [name] is a 51 y.o. female with medical history significant of seizure disorder, A-fib, TBI, Asthma who was brought in by her husband for spells which he felt may be seizures but are different from her usual seizures. They have been more frequent over the past 2 wks.. She was in the neurologist's ([facility]) 1 day prior to admission, and had 2 staring spell and was sent to the ER. In the ER she had spells with flailing of extremities which appeared to be pseudoseizures. She was given a Keppra bolus and Lamictal was increased and she was discharged. Her husband brought her in again as she had another episode in her sleep. When admitting physician, entered her room she was having another one. I witnessed her rolling in the stretcher, holding her head and moaning. It resolved in about 1 min. She subsequently was not responding to me and appeared to be asleep. No loss of bowel or bladder continence. Pupils reactive.  ER has spoken with Neuro and she is to be admitted to [facility] to continuous EEG.     She was  seen in there ER for NVD last week. Received Phenergan in the ER. Husband does not know if she had new prescriptions. Phenergan is noted on her med list but it was prescribed in 2017 and is not a new medication. She has recovered from her GI issues and has been eating well lately.   No recent stressors that the husband knows of. She has been going to work as usual. She takes her own medications and he thinks she is taking them appropriately. No changes in medication that he knows of.   ED Course: normal Vital signs- head CT without contrast 11/27/2016 was normal.    Hospital Course:  #1 probable non-epileptic seizures - Patient was admitted with spells which are concerning for possible epileptic seizures. Admitting physician felt patient's spells were likely not seizures however in discussion with neurology it was recommended that patient be transferred to [facility] for continuous EEG. Neurology was consulted and followed the patient throughout the hospitalization. Patient was noted to have a basic metabolic profile which was unremarkable. Patient's CBC was notable for hemoglobin of 9.8 otherwise was within normal limits. Head CT which was done one day prior to admission in the ED was unremarkable. Patient had no signs or symptoms of infection. Patient was monitored and followed and placed on continuous video EEG recordings. Patient was noted to have a typical event of a spells on the morning 11/29/2016 which was captured on EEG which per neurology was consistent with a non-epileptic event. It was noted that patient's antiepileptic medications  had recently been adjusted 2 days prior to admission and it was recommended that patient continue on these medications. Neurology also felt that at this point no further workup was necessary. EEG was discontinued. It was recommended that patient be discharged home with instructions to follow-up with her primary neurologist Dr. Brett [name]. It was felt  patient's spells should not be treated as a seizure and she should not be given any benzodiazepines during the spells as they were nonepileptic. Neurology discussed EEG findings with the patient and her husband and explained to them that his spells were nonepileptic seizures. It is noted that patient's primary neurologist, Dr. Brett [name], feels patient needs to be seen in epilepsy monitoring unit of a tertiary care center and arrangements have been made by patient's primary neurologist to see whether patient can be assessed at [facility] for further evaluation.   Active Problems:   Partial symptomatic epilepsy with complex partial seizures, not intractable, without status epilepticus - Patient was seen in consultation by neurology and followed. Patient was assessed for his spells that she was admitted for and placed on continuous EEG which was negative for any epileptiform activity. Patient was maintained on home regimen of Lamictal and Topamax as prescribed by her neurologist. Outpatient follow-up.    Chronic headaches - continued on home meds- Topamax, Lopressor. - Patient however during the hospitalization did complain of a bad headache and was given a dose of Reglan 10 mg IV 1, Benadryl 25 mg IV 1, Toradol 30 mg IV 1 with clinical improvement. Outpatient follow-up with her neurologist.    Atrial fibrillation - noted on chart - on Lopressor but not on anticoagulation - HR sounded regular on exam - EKG showed NSR    Asthma, mild intermittent - Remained stable throughout the hospitalization. Patient was retreated on home regimen of  inhalers, nebs.   ?? Prediabetes - Patient noted to be on metformin prior to admission however patient did state metformin was intended for weight loss and not for diabetes. Hemoglobin A1c obtained on 10/09/2016 was 5.5. CBGs during the hospitalization ranged from 70-101. Patient was initially placed on a sliding scale insulin which was subsequently  discontinued as patient was noted not to have a history of diabetes.    Back pain at L4-L5 level - home med list mentioned Cymbalta, which was resumed during the hospitalization. Outpatient follow-up.     Surgical menopause on hormone replacement therapy Continued on home regimen of estradiol    Vitamin D deficiency - Outpatient follow-up.   Procedures:  Continuous EEG 11/28/2016    Consultations:  Neurology: Dr.Oster 11/27/2016  Discharge Exam: Vitals:   11/29/16 1136 11/29/16 1524  BP: (!) 154/71 115/60  Pulse: 87 60  Resp: 20 20  Temp: 99.6 F (37.6 C) 98.9 F (37.2 C)    General: NAD Cardiovascular: RRR Respiratory: CTAB anterior lung fields.  Discharge Instructions   Discharge Instructions    Diet - low sodium heart healthy    Complete by:  As directed    Discharge instructions    Complete by:  As directed    No driving for at least 6 months and when cleared by neurologist.   Increase activity slowly    Complete by:  As directed      Current Discharge Medication List    CONTINUE these medications which have NOT CHANGED   Details  albuterol (PROVENTIL HFA;VENTOLIN HFA) 108 (90 BASE) MCG/ACT inhaler Inhale 2 puffs into the lungs every 6 (six) hours as needed for wheezing  or shortness of breath.     albuterol (PROVENTIL) (2.5 MG/3ML) 0.083% nebulizer solution Take 2.5 mg by nebulization every 6 (six) hours as needed for wheezing or shortness of breath.    clonazePAM (KLONOPIN) 1 MG tablet Take 1 mg by mouth daily as needed (at onset of a seizure).     dihydroergotamine (MIGRANAL) 4 MG/ML nasal spray Place 1 spray into the nose as needed for migraine. Use in one nostril as directed.  No more than 4 sprays in one hour Qty: 8 mL, Refills: 12    DULoxetine (CYMBALTA) 30 MG capsule Take 60 mg by mouth daily as needed (for back pain).     estradiol (ESTRACE) 1 MG tablet Take 1 tablet (1 mg total) by mouth daily. Qty: 30 tablet, Refills: 1     gabapentin (NEURONTIN) 300 MG capsule Take 300 mg by mouth at bedtime.     lamoTRIgine (LAMICTAL) 150 MG tablet Take 1 tablet (150 mg total) by mouth 2 (two) times daily. Qty: 180 tablet, Refills: 1    meclizine (ANTIVERT) 25 MG tablet Take 25 mg by mouth 3 (three) times daily as needed for dizziness.  Refills: 1    metFORMIN (GLUCOPHAGE) 500 MG tablet Take 1 tablet (500 mg total) by mouth daily with breakfast. Qty: 30 tablet, Refills: 0   Associated Diagnoses: Prediabetes    metoprolol tartrate (LOPRESSOR) 25 MG tablet Take 1 tablet (25 mg total) by mouth 2 (two) times daily. Qty: 60 tablet, Refills: 0    naproxen sodium (ALEVE) 220 MG tablet Take 440 mg by mouth 2 (two) times daily as needed (for pain).     ondansetron (ZOFRAN) 8 MG tablet Take 1 tablet (8 mg total) by mouth every 8 (eight) hours as needed for nausea. Qty: 6 tablet, Refills: 0    promethazine (PHENERGAN) 50 MG tablet Take 0.5 tablets (25 mg total) by mouth every 6 (six) hours as needed for nausea or vomiting. Qty: 30 tablet, Refills: 0    ranitidine (ZANTAC) 300 MG tablet Take 300 mg by mouth daily as needed for heartburn.     topiramate (TOPAMAX) 100 MG tablet Take 1 tablet (100 mg total) by mouth 2 (two) times daily. Qty: 60 tablet, Refills: 11    Vitamin D, Ergocalciferol, (DRISDOL) 50000 units CAPS capsule Take 1 capsule (50,000 Units total) by mouth every Tuesday. Qty: 30 capsule, Refills: 0   Associated Diagnoses: Vitamin D deficiency    HYDROcodone-acetaminophen (NORCO) 5-325 MG tablet Take 1-2 tablets by mouth every 6 (six) hours as needed. Qty: 15 tablet, Refills: 0       Allergies  Allergen Reactions  . Amoxicillin Anaphylaxis, Hives and Other (See Comments)    Has patient had a PCN reaction causing immediate rash, facial/tongue/throat swelling, SOB or lightheadedness with hypotension: Yes Has patient had a PCN reaction causing severe rash involving mucus membranes or skin necrosis: No Has  patient had a PCN reaction that required hospitalization No Has patient had a PCN reaction occurring within the last 10 years: No If all of the above answers are "NO", then may proceed with Cephalosporin use.  Marland Kitchen Penicillins Anaphylaxis, Hives and Other (See Comments)    Has patient had a PCN reaction causing immediate rash, facial/tongue/throat swelling, SOB or lightheadedness with hypotension: Yes Has patient had a PCN reaction causing severe rash involving mucus membranes or skin necrosis: No Has patient had a PCN reaction that required hospitalization No Has patient had a PCN reaction occurring within the last 10 years:  No If all of the above answers are "NO", then may proceed with Cephalosporin use.  . Biaxin [Clarithromycin] Hives  . Erythromycin Base Hives  . Zithromax [Azithromycin] Hives and Nausea Only   Follow-up Information    DOHMEIER,CARMEN, MD. Schedule an appointment as soon as possible for a visit in 1 week(s).   Specialty:  Neurology Why:  f/u 1-2 weeks. Contact information: [address] [place] [address] [phone]            The results of significant diagnostics from this hospitalization (including imaging, microbiology, ancillary and laboratory) are listed below for reference.    Significant Diagnostic Studies: Ct Head Wo Contrast  Result Date: 11/27/2016 CLINICAL DATA:  Seizure today.  Headache. EXAM: CT HEAD WITHOUT CONTRAST TECHNIQUE: Contiguous axial images were obtained from the base of the skull through the vertex without intravenous contrast. COMPARISON:  Brain MRI and head CT scan 02/05/2016. FINDINGS: Brain: The brain appears normal without hemorrhage, infarct, mass lesion, mass effect, midline shift or abnormal extra-axial fluid collection. No hydrocephalus or pneumocephalus. Vascular: Negative. Skull: Intact. 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 Sinuses/Orbits: Negative. Other:  None. IMPRESSION: Negative head CT. Electronically Signed   By: Inge Rise M.D.   On: 11/27/2016 14:10   Ct Abdomen Pelvis W Contrast  Result Date: 11/23/2016 CLINICAL DATA:  Generalized abdominal pain. EXAM: CT ABDOMEN AND PELVIS WITH CONTRAST TECHNIQUE: Multidetector CT imaging of the abdomen and pelvis was performed using the standard protocol following bolus administration of intravenous contrast. CONTRAST:  155mL ISOVUE-300 IOPAMIDOL (ISOVUE-300) INJECTION 61% COMPARISON:  CT 01/05/2016 FINDINGS: Lower chest: The lung bases are clear. Hepatobiliary: Mild hepatic steatosis without focal lesion. Gallbladder physiologically distended, no calcified stone. No biliary dilatation. Pancreas: No ductal dilatation or inflammation. Spleen: Normal in size without focal abnormality. Adrenals/Urinary Tract: Normal adrenal glands. No hydronephrosis or perinephric edema. Simple cyst in the lower left kidney, unchanged from prior exam. Tiny cortical hypodensity in the lower right kidney is too small to characterize. Urinary bladder is physiologically distended without stone. Stomach/Bowel: The stomach is decompressed. There is no bowel wall thickening, inflammation or obstruction. Hyperdensity consist with ingested material in the cecum. Normal appendix. Vascular/Lymphatic: No significant vascular findings are present. No enlarged abdominal or pelvic lymph nodes. Reproductive: Status post hysterectomy. No adnexal masses. Previous pessary has been removed. Other: Postsurgical change of the anterior abdominal wall with stable tiny fat containing paraumbilical hernia. No bowel involvement or inflammation. No free air, free fluid, or intra-abdominal fluid collection. Musculoskeletal: There are no acute or suspicious osseous abnormalities. Stable mild pubic symphyseal widening. Hemi transitional lumbosacral anatomy. IMPRESSION: 1. No acute abnormality in the abdomen/pelvis. No change from prior exam. 2. Hepatic steatosis.  Postsurgical change of the anterior abdominal wall with tiny fat containing umbilical hernia, stable. Electronically Signed   By: Jeb Levering M.D.   On: 11/23/2016 23:08    Microbiology: No results found for this or any previous visit (from the past 240 hour(s)).   Labs: Basic Metabolic Panel:  Recent Labs Lab 11/23/16 2030 11/27/16 1409 11/27/16 1423 11/28/16 0809 11/28/16 1210 11/29/16 0550  NA 139 140 144 145  --  140  K 4.1 3.7 3.6 3.7  --  3.7  CL 110 111 112* 111  --  110  CO2 21* 20*  --   --   --  24  GLUCOSE 104* 83 82 98  --  102*  BUN 14 11 10  10  --  9  CREATININE 0.78 0.90 1.00 0.90 0.81 0.95  CALCIUM 9.1 8.5*  --   --   --  8.7*   Liver Function Tests:  Recent Labs Lab 11/23/16 2030 11/27/16 1409  AST 16 18  ALT 18 21  ALKPHOS 104 96  BILITOT 0.6 0.3  PROT 7.4 6.6  ALBUMIN 3.9 3.5    Recent Labs Lab 11/23/16 2030  LIPASE 11   No results for input(s): AMMONIA in the last 168 hours. CBC:  Recent Labs Lab 11/23/16 2030 11/27/16 1409 11/27/16 1423 11/28/16 0809 11/28/16 1210 11/29/16 0550  WBC 7.0 5.2  --   --  5.7 6.0  NEUTROABS  --  2.4  --   --   --   --   HGB 13.6 12.1 12.6 12.6 11.8* 12.4  HCT 41.0 37.3 37.0 37.0 35.2* 37.9  MCV 81.2 81.1  --   --  78.6 81.7  PLT 172 164  --   --  167 181   Cardiac Enzymes: No results for input(s): CKTOTAL, CKMB, CKMBINDEX, TROPONINI in the last 168 hours. BNP: BNP (last 3 results) No results for input(s): BNP in the last 8760 hours.  ProBNP (last 3 results) No results for input(s): PROBNP in the last 8760 hours.  CBG:  Recent Labs Lab 11/28/16 1640 11/28/16 1944 11/28/16 2325 11/29/16 0401 11/29/16 0813  GLUCAP 70 84 101* 95 97       Signed:  Audrey Eller MD.  [facility] 11/29/2016, 6:04 PM

## 2016-12-12 ENCOUNTER — Telehealth: Payer: Self-pay | Admitting: Neurology

## 2016-12-12 ENCOUNTER — Ambulatory Visit (INDEPENDENT_AMBULATORY_CARE_PROVIDER_SITE_OTHER): Payer: BC Managed Care – PPO | Admitting: Family Medicine

## 2016-12-12 ENCOUNTER — Encounter (INDEPENDENT_AMBULATORY_CARE_PROVIDER_SITE_OTHER): Payer: Self-pay

## 2016-12-12 NOTE — Telephone Encounter (Signed)
Spoke with patient and advised her this RN can not find any record of Megan or Dr Dohmeier prescribing Klonopin for her. She stated she could not read on her bottle. This RN advised she call [facility] and get the prescribing dr's name. Patient agreed to call. Then this RN inquired about when her video EEG at [facility] is scheduled. Patient stated she has not heard from [facility]. This RN stated would send this note to [facility], referral coordinator to check on status. Patient verbalized understanding.

## 2016-12-12 NOTE — Telephone Encounter (Signed)
I have spoke to the patient and she is aware of details of video EEG I have contacted [place] at [facility] .   Patient want's to be called back about her Klonopin.

## 2016-12-12 NOTE — Telephone Encounter (Signed)
Patient called office requesting refill for clonazePAM (KLONOPIN) 1 MG tablet.  [facility] [address]

## 2016-12-12 NOTE — Telephone Encounter (Addendum)
Patient's video EEG at [facility] is February 04, 2017. Spoke with patient re: Klonopin. She stated pharmacy had no data beyond 24 mos ago to tell her who last prescribed it.  She stated Dr Brett Fairy had prescribed in the past for her to take as needed for seizures. Patient stated she has not had to refill it lately because she was not using it often. She stated she I snow having "little spells" and needs it. She also stated she felt she would need it when she goes for her EEG. Advised will route her request to [facility]. She verbalized understanding.

## 2016-12-17 ENCOUNTER — Other Ambulatory Visit: Payer: Self-pay

## 2016-12-17 MED ORDER — ESTRADIOL 1 MG PO TABS: 1.0000 mg | ORAL_TABLET | Freq: Every day | ORAL | 2 refills | Status: DC

## 2016-12-17 NOTE — Telephone Encounter (Signed)
Noted, please notify patient and send Dr. Brett Fairy message so she can address when she returns.

## 2016-12-17 NOTE — Telephone Encounter (Signed)
This should be decided by Dr. Brett Fairy or Phychiatrist. Hasn't been refilled since 2015 so it can wait till Dr. Brett Fairy returns.

## 2016-12-17 NOTE — Telephone Encounter (Signed)
Spoke with patient and informed her that NP would not refill Klonopin at this time due to it being last refilled in 2015. Advised her Dr Roddie Mc will return to office next week, and her request will be sent to Dr Brett Fairy to address. Patient verbalized understanding.

## 2016-12-25 ENCOUNTER — Ambulatory Visit: Payer: BC Managed Care – PPO | Admitting: Adult Health

## 2016-12-26 ENCOUNTER — Encounter: Payer: Self-pay | Admitting: Neurology

## 2016-12-26 ENCOUNTER — Ambulatory Visit (INDEPENDENT_AMBULATORY_CARE_PROVIDER_SITE_OTHER): Payer: BC Managed Care – PPO | Admitting: Neurology

## 2016-12-26 ENCOUNTER — Ambulatory Visit
Admission: RE | Admit: 2016-12-26 | Discharge: 2016-12-26 | Disposition: A | Discharge status: Home / Self Care | Payer: BC Managed Care – PPO | Source: Ambulatory Visit | Attending: Family Medicine | Admitting: Family Medicine

## 2016-12-26 VITALS — BP 131/72 | HR 76 | Ht 62.5 in | Wt 278.0 lb

## 2016-12-26 DIAGNOSIS — R569 Unspecified convulsions: Secondary | ICD-10-CM | POA: Diagnosis not present

## 2016-12-26 DIAGNOSIS — G43101 Migraine with aura, not intractable, with status migrainosus: Secondary | ICD-10-CM

## 2016-12-26 DIAGNOSIS — Z1231 Encounter for screening mammogram for malignant neoplasm of breast: Secondary | ICD-10-CM | POA: Diagnosis not present

## 2016-12-26 DIAGNOSIS — G4759 Other parasomnia: Secondary | ICD-10-CM | POA: Diagnosis not present

## 2016-12-26 DIAGNOSIS — E669 Obesity, unspecified: Secondary | ICD-10-CM | POA: Diagnosis not present

## 2016-12-26 MED ORDER — SUMATRIPTAN SUCCINATE 50 MG PO TABS: 50.0000 mg | ORAL_TABLET | ORAL | 0 refills | Status: DC | PRN

## 2016-12-26 MED ORDER — CLONAZEPAM 1 MG PO TABS: 1.0000 mg | ORAL_TABLET | ORAL | 0 refills | Status: DC | PRN

## 2016-12-26 NOTE — Patient Instructions (Signed)
Revisit after EMU stay , with either MD or Np.

## 2016-12-26 NOTE — Addendum Note (Signed)
Addended by: Larey Seat on: 12/26/2016 09:52 AM   Modules accepted: Orders
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
## 2017-01-09 ENCOUNTER — Telehealth: Payer: Self-pay

## 2017-01-09 NOTE — Telephone Encounter (Signed)
I received FMLA paperwork for pt's husband. I called pt to discuss. She says that she needs the form filled out for the dates she was in the hospital in April and upcoming in June at [facility] and is requesting 3-5 days per month off for her husband to care for her while she is sick. I advised her that I would send this request to Dr. Brett Fairy to review.

## 2017-01-09 NOTE — Telephone Encounter (Signed)
3-5 days a month sounds reasonable. Especially for EMU stay. I will support her request. CD

## 2017-01-10 NOTE — Telephone Encounter (Signed)
Form filled out and signed by Dr. Brett Fairy. Sent to MR for processing.

## 2017-01-11 ENCOUNTER — Ambulatory Visit (INDEPENDENT_AMBULATORY_CARE_PROVIDER_SITE_OTHER): Payer: BC Managed Care – PPO | Admitting: Neurology

## 2017-01-11 DIAGNOSIS — G4759 Other parasomnia: Secondary | ICD-10-CM

## 2017-01-11 DIAGNOSIS — G43101 Migraine with aura, not intractable, with status migrainosus: Secondary | ICD-10-CM

## 2017-01-11 DIAGNOSIS — R569 Unspecified convulsions: Secondary | ICD-10-CM

## 2017-01-11 DIAGNOSIS — E669 Obesity, unspecified: Secondary | ICD-10-CM

## 2017-01-11 DIAGNOSIS — G4733 Obstructive sleep apnea (adult) (pediatric): Secondary | ICD-10-CM | POA: Diagnosis not present

## 2017-01-14 ENCOUNTER — Encounter (INDEPENDENT_AMBULATORY_CARE_PROVIDER_SITE_OTHER): Payer: Self-pay | Admitting: Physician Assistant

## 2017-01-14 ENCOUNTER — Ambulatory Visit (INDEPENDENT_AMBULATORY_CARE_PROVIDER_SITE_OTHER): Payer: BC Managed Care – PPO

## 2017-01-14 ENCOUNTER — Ambulatory Visit (INDEPENDENT_AMBULATORY_CARE_PROVIDER_SITE_OTHER): Payer: BC Managed Care – PPO | Admitting: Physician Assistant

## 2017-01-14 VITALS — Ht 62.5 in | Wt 275.0 lb

## 2017-01-14 DIAGNOSIS — M25562 Pain in left knee: Secondary | ICD-10-CM | POA: Diagnosis not present

## 2017-01-14 DIAGNOSIS — Z0289 Encounter for other administrative examinations: Secondary | ICD-10-CM

## 2017-01-14 MED ORDER — LIDOCAINE HCL 1 % IJ SOLN
1.0000 mL | INTRAMUSCULAR | Status: AC | PRN
  Administered 2017-01-14: 1 mL

## 2017-01-14 MED ORDER — METHYLPREDNISOLONE ACETATE 40 MG/ML IJ SUSP
40.0000 mg | INTRAMUSCULAR | Status: AC | PRN
  Administered 2017-01-14: 40 mg via INTRA_ARTICULAR
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
## 2017-01-21 NOTE — Procedures (Signed)
PATIENT'S NAME:  [name] [name], [name] [name] DOB:      08-19-1966      MR#:    [id number]     DATE OF RECORDING: 01/11/2017 REFERRING M.D.:  Cecille Rubin, [facility] and [name], MD Study Performed:   Polysomnogram with expanded EEG montage/ Seizure protocol HISTORY:  [name] [name] is a 51 year old female with a history of traumatic brain injury, migraines, Super obesity, Back pain and spells/ seizures, with increased seizure activity last 2 weeks - 2 recent seizures that occurred during her sleep. Her husband witnessed as she was moving her extremities and woke him up. She also claims she is having increased seizure activity during the day. She denies having seizures in her sleep before. The patient's weight 278 pounds with a height of 62 (inches), resulting in a BMI of 49.8 kg/m2.The patient's neck circumference measured 17 inches.  CURRENT MEDICATIONS: Proventil, Klonopin, Migranal, Cymbalta, Estrace, Neurontin, Lamictal, Glucophage, Lopressor, Zantac, Topamax, Drisdol, Norco, Antivert, Aleve, Zofran, Phenergan,   PROCEDURE:  This is a multichannel digital polysomnogram utilizing the Somnostar 11.2 system.  Electrodes and sensors were applied and monitored per AASM Specifications.   EEG, EOG, Chin and Limb EMG, were sampled at 200 Hz.  ECG, Snore and Nasal Pressure, Thermal Airflow, Respiratory Effort, CPAP Flow and Pressure, Oximetry was sampled at 50 Hz. Digital video and audio were recorded.      BASELINE STUDY: Lights Out was at 22:11 and Lights On at 05:09.  Total recording time (TRT) was 419 minutes, with a total sleep time (TST) of 394 minutes. The patient's sleep latency was 5 minutes.  REM latency was 60 minutes.  The sleep efficiency was 94. %.     SLEEP ARCHITECTURE: WASO (Wake after sleep onset) was 20 minutes.  There were 3.5 minutes in Stage N1, 234.5 minutes Stage N2, 92 minutes Stage N3 and 64 minutes in Stage REM.  The percentage of Stage N1 was .9%, Stage N2 was 59.5%, Stage N3 was 23.4% and Stage R  (REM sleep) was 16.2%.   RESPIRATORY ANALYSIS:  There were a total of 58 respiratory events:  17 obstructive apneas, 0 central apneas and 4 mixed apneas with 37 hypopneas. The patient also had 0 respiratory event related arousals (RERAs).    The total APNEA/HYPOPNEA INDEX (AHI) was 8.8/hour and the total RESPIRATORY DISTURBANCE INDEX was 8.8 /hour.  26 events occurred in REM sleep and 39 events in NREM.  The REM AHI was 24.4 /hour, versus a non-REM AHI of 5.8. The patient spent 371.5 minutes of total sleep time in the supine position and 23 minutes in non-supine. The supine AHI was 9.4 versus a non-supine AHI of 0.0.  OXYGEN SATURATION & C02:  The Wake baseline 02 saturation was 94%, with the lowest being 83%. Time spent below 89% saturation equaled 4 minutes.  PERIODIC LIMB MOVEMENTS: The patient had a total of 0 Periodic Limb Movements.  The arousals were noted as: 73 were spontaneous, 0 were associated with PLMs, and 23 were associated with respiratory events.  Audio and video analysis did not show any abnormal or unusual movements, behaviors, phonations or vocalizations.  EKG was in keeping with normal sinus rhythm (NSR) with PVCs.  EEG without nocturnal seizure activity or epileptiform discharges.  The patient took one bathroom break. Snoring was noted. The Periodic Limb Movement (PLM) index was 0 and the PLM Arousal index was 0/hour. Post-study, the patient indicated that sleep was the same as usual, of poor quality and fragmented.    IMPRESSION:  1. Mild Obstructive Sleep Apnea (OSA) at AHI 8.8/hr. but accentuated during REM sleep to 24.4 and supine at AHI 9.4. No clinically significant spO2 desaturation. 2. Early REM onset, and 5 cycles of REM sleep.    3.  The patient felt that is was back pain that caused many arousals.   RECOMMENDATIONS:  1. The patient will be given the option of a CPAP titration study to optimize therapy. This should be based on her Epworth score. too.    2. Alternatives are: positional therapy, and weight loss. Positional therapy is advised. 3. Avoid sedative-hypnotics which may worsen sleep apnea, alcohol and tobacco (as applicable). 4. Advise to lose weight, diet and exercise if not contraindicated (BMI over 45). 5. Further information regarding OSA may be obtained from [employer] (www.sleepfoundation.org) or American Sleep Apnea Association (www.sleepapnea.org). 6. A follow up appointment will be scheduled at [facility]. The referring provider will be notified of the results.      I certify that I have reviewed the entire raw data recording prior to the issuance of this report in accordance with the Standards of Accreditation of the [place] Academy of Sleep Medicine (AASM)      Larey Seat, MD   01-20-2017  Diplomat, American Board of Psychiatry and Neurology  Diplomat, American Board of [place] Director, [place] [facility] [employer]

## 2017-01-22 ENCOUNTER — Telehealth: Payer: Self-pay

## 2017-01-22 NOTE — Telephone Encounter (Signed)
Pt returned my call. I advised pt that her sleep study revealed mild osa with REM accentuation and supine accentuation without significant O2 desaturation. EEG and EKG was not abnormal. I advised pt that Dr. Brett Fairy recommends that pt consider a cpap titration study. I also advised pt that the alternatives to cpap are positional therapy and weight loss. I advised pt to avoid sedative-hypnotics which may worsen sleep apnea, alcohol and tobacco, and pain medications. I advised pt that she should lose weight, diet and exercise if not contraindicated by her other physicians. Pt declined a cpap titration study at this time. Pt reports that she does not sleep on her back but rather her stomach and side. Pt says that she is being forced to retire from her job because of her medical problems. Pt has a current migraine and has tried the imitrex but it is not helping. Pt verbalized understanding of results. Pt had no questions at this time but was encouraged to call back if questions arise.

## 2017-01-22 NOTE — Telephone Encounter (Signed)
I called pt to discuss her sleep study results. No answer, left a message asking her to call me back. 

## 2017-01-22 NOTE — Telephone Encounter (Signed)
-----   Message from Larey Seat, MD sent at 01/21/2017  4:05 PM EDT ----- IMPRESSION: Post-study, the patient indicated that sleep was the same as usual, of poor quality and fragmented.    1. Mild Obstructive Sleep Apnea (OSA) at AHI 8.8/hr. but accentuated during REM sleep to 24.4 and supine at AHI 9.4. No clinically significant spO2 desaturation. 2. Early REM onset, and 5 cycles of REM sleep.    3.  The patient felt that is was back pain that caused many arousals.  4. EEG was not abnormal, neither was EKG  RECOMMENDATIONS:  1. The patient will be given the option of a CPAP titration study to optimize therapy. This should be based on her Epworth score. too.   2. Alternatives are: positional therapy, and weight loss. Positional therapy is advised. 3. Avoid sedative-hypnotics which may worsen sleep apnea- see medication list , including pain medication(as applicable). 4. Advise to lose weight, diet and exercise if not contraindicated (BMI over 45). 5. Further information regarding OSA may be obtained from [employer] (www.sleepfoundation.org) or American Sleep Apnea Association (www.sleepapnea.org). 6. A follow up appointment will be scheduled at [facility]. The referring provider will be notified of the results.

## 2017-01-28 ENCOUNTER — Telehealth: Payer: Self-pay | Admitting: Neurology

## 2017-01-28 ENCOUNTER — Ambulatory Visit (INDEPENDENT_AMBULATORY_CARE_PROVIDER_SITE_OTHER): Payer: BC Managed Care – PPO | Admitting: Physician Assistant

## 2017-01-28 NOTE — Telephone Encounter (Signed)
Neoma Laming called from [employer] firm in reference to and email that was sent directly to you to answer questions about patient medical history and what future care might be for the patient.  Please call

## 2017-01-28 NOTE — Telephone Encounter (Signed)
Will review e mail soon. Thank you.

## 2017-01-30 NOTE — Telephone Encounter (Signed)
I am not involved with this, please send this to Dr. Brett Fairy only.

## 2017-01-30 NOTE — Telephone Encounter (Signed)
Pt called said the Law firm has not heard back from provider reg the email that was sent to her. I asked her to call law firm and inquire what date it was emailed.

## 2017-01-30 NOTE — Telephone Encounter (Signed)
I explained that I can only answer questions if deposed. CD

## 2017-01-30 NOTE — Telephone Encounter (Signed)
Pt called back said the law firm advised email was sent 5/4, 5/17, 6/6. She is wanting to know if provider has rec'd the emails. Please call law firm phone # [phone] Pt can be reached at [phone]

## 2017-02-04 DIAGNOSIS — R569 Unspecified convulsions: Secondary | ICD-10-CM | POA: Insufficient documentation

## 2017-02-04 DIAGNOSIS — Z8673 Personal history of transient ischemic attack (TIA), and cerebral infarction without residual deficits: Secondary | ICD-10-CM | POA: Insufficient documentation

## 2017-02-04 DIAGNOSIS — IMO0001 Reserved for inherently not codable concepts without codable children: Secondary | ICD-10-CM | POA: Insufficient documentation

## 2017-02-04 DIAGNOSIS — F419 Anxiety disorder, unspecified: Secondary | ICD-10-CM | POA: Insufficient documentation

## 2017-02-26 ENCOUNTER — Ambulatory Visit (HOSPITAL_COMMUNITY): Payer: BC Managed Care – PPO | Admitting: Psychiatry

## 2017-03-07 ENCOUNTER — Ambulatory Visit (INDEPENDENT_AMBULATORY_CARE_PROVIDER_SITE_OTHER): Payer: BC Managed Care – PPO | Admitting: Neurology

## 2017-03-07 ENCOUNTER — Encounter: Payer: Self-pay | Admitting: Neurology

## 2017-03-07 VITALS — BP 118/79 | HR 72 | Ht 62.0 in | Wt 280.0 lb

## 2017-03-07 DIAGNOSIS — G43019 Migraine without aura, intractable, without status migrainosus: Secondary | ICD-10-CM

## 2017-03-07 MED ORDER — SUMATRIPTAN SUCCINATE 50 MG PO TABS: 50.0000 mg | ORAL_TABLET | ORAL | 0 refills | Status: DC | PRN
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
## 2017-03-07 NOTE — Patient Instructions (Signed)

## 2017-03-20 ENCOUNTER — Other Ambulatory Visit: Payer: Self-pay | Admitting: Medical

## 2017-03-20 ENCOUNTER — Other Ambulatory Visit (HOSPITAL_COMMUNITY): Payer: Self-pay | Admitting: Medical

## 2017-03-20 ENCOUNTER — Other Ambulatory Visit (HOSPITAL_COMMUNITY): Payer: Self-pay | Admitting: Family Medicine

## 2017-03-20 DIAGNOSIS — R1084 Generalized abdominal pain: Secondary | ICD-10-CM

## 2017-03-22 ENCOUNTER — Encounter (HOSPITAL_COMMUNITY): Payer: Self-pay

## 2017-03-22 ENCOUNTER — Ambulatory Visit (HOSPITAL_COMMUNITY)
Admission: RE | Admit: 2017-03-22 | Discharge: 2017-03-22 | Disposition: A | Discharge status: Home / Self Care | Payer: BC Managed Care – PPO | Source: Ambulatory Visit | Attending: Family Medicine | Admitting: Family Medicine

## 2017-03-22 DIAGNOSIS — Z9889 Other specified postprocedural states: Secondary | ICD-10-CM | POA: Diagnosis not present

## 2017-03-22 DIAGNOSIS — R1084 Generalized abdominal pain: Secondary | ICD-10-CM

## 2017-03-22 DIAGNOSIS — K760 Fatty (change of) liver, not elsewhere classified: Secondary | ICD-10-CM | POA: Diagnosis not present

## 2017-03-22 MED ORDER — IOPAMIDOL (ISOVUE-300) INJECTION 61%
INTRAVENOUS | Status: AC
  Administered 2017-03-22: 100 mL
  Filled 2017-03-22: qty 100

## 2017-03-29 ENCOUNTER — Ambulatory Visit (HOSPITAL_COMMUNITY): Payer: BC Managed Care – PPO

## 2017-04-07 ENCOUNTER — Emergency Department (HOSPITAL_COMMUNITY)
Admission: EM | Admit: 2017-04-07 | Discharge: 2017-04-07 | Disposition: A | Discharge status: Home / Self Care | Payer: BC Managed Care – PPO | Attending: Emergency Medicine | Admitting: Emergency Medicine

## 2017-04-07 ENCOUNTER — Encounter (HOSPITAL_COMMUNITY): Payer: Self-pay

## 2017-04-07 ENCOUNTER — Emergency Department (HOSPITAL_COMMUNITY): Payer: BC Managed Care – PPO

## 2017-04-07 DIAGNOSIS — Z79899 Other long term (current) drug therapy: Secondary | ICD-10-CM | POA: Insufficient documentation

## 2017-04-07 DIAGNOSIS — J4520 Mild intermittent asthma, uncomplicated: Secondary | ICD-10-CM | POA: Diagnosis not present

## 2017-04-07 DIAGNOSIS — R079 Chest pain, unspecified: Secondary | ICD-10-CM | POA: Diagnosis not present

## 2017-04-07 DIAGNOSIS — M549 Dorsalgia, unspecified: Secondary | ICD-10-CM

## 2017-04-07 LAB — CBC WITH DIFFERENTIAL/PLATELET
BASOS ABS: 0 10*3/uL (ref 0.0–0.1)
Basophils Relative: 0 %
EOS PCT: 1 %
Eosinophils Absolute: 0.1 10*3/uL (ref 0.0–0.7)
HEMATOCRIT: 39.8 % (ref 36.0–46.0)
Hemoglobin: 13.4 g/dL (ref 12.0–15.0)
LYMPHS ABS: 3.2 10*3/uL (ref 0.7–4.0)
LYMPHS PCT: 42 %
MCH: 27.9 pg (ref 26.0–34.0)
MCHC: 33.7 g/dL (ref 30.0–36.0)
MCV: 82.7 fL (ref 78.0–100.0)
MONO ABS: 0.6 10*3/uL (ref 0.1–1.0)
MONOS PCT: 7 %
NEUTROS ABS: 3.8 10*3/uL (ref 1.7–7.7)
Neutrophils Relative %: 50 %
Platelets: 187 10*3/uL (ref 150–400)
RBC: 4.81 MIL/uL (ref 3.87–5.11)
RDW: 14.9 % (ref 11.5–15.5)
WBC: 7.7 10*3/uL (ref 4.0–10.5)

## 2017-04-07 LAB — COMPREHENSIVE METABOLIC PANEL
ALT: 15 U/L (ref 14–54)
AST: 14 U/L — AB (ref 15–41)
Albumin: 3.9 g/dL (ref 3.5–5.0)
Alkaline Phosphatase: 106 U/L (ref 38–126)
Anion gap: 5 (ref 5–15)
BILIRUBIN TOTAL: 0.2 mg/dL — AB (ref 0.3–1.2)
BUN: 19 mg/dL (ref 6–20)
CO2: 21 mmol/L — ABNORMAL LOW (ref 22–32)
CREATININE: 0.96 mg/dL (ref 0.44–1.00)
Calcium: 9 mg/dL (ref 8.9–10.3)
Chloride: 115 mmol/L — ABNORMAL HIGH (ref 101–111)
Glucose, Bld: 92 mg/dL (ref 65–99)
POTASSIUM: 3.8 mmol/L (ref 3.5–5.1)
Sodium: 141 mmol/L (ref 135–145)
TOTAL PROTEIN: 7.8 g/dL (ref 6.5–8.1)

## 2017-04-07 LAB — TROPONIN I

## 2017-04-07 MED ORDER — HYDROMORPHONE HCL 1 MG/ML IJ SOLN
1.0000 mg | Freq: Once | INTRAMUSCULAR | Status: AC
  Administered 2017-04-07: 1 mg via INTRAMUSCULAR
  Filled 2017-04-07: qty 1

## 2017-04-07 MED ORDER — HYDROMORPHONE HCL 1 MG/ML IJ SOLN: 1.0000 mg | Freq: Once | INTRAMUSCULAR | Status: DC

## 2017-04-07 MED ORDER — OXYCODONE-ACETAMINOPHEN 5-325 MG PO TABS: 1.0000 | ORAL_TABLET | ORAL | 0 refills | Status: DC | PRN

## 2017-04-07 MED ORDER — ONDANSETRON HCL 4 MG PO TABS: 4.0000 mg | ORAL_TABLET | Freq: Three times a day (TID) | ORAL | 0 refills | Status: DC | PRN

## 2017-04-07 NOTE — Discharge Instructions (Signed)
Tests showed no life-threatening condition. Prescription for pain and nausea medicine. Follow-up your primary care doctor.

## 2017-04-07 NOTE — ED Triage Notes (Signed)
Patient coming from home with c/o shortness of breath started yesterday. Patient state hurt went she lay down to her lower back. Pt also complain of numbness to the leg but she is able to move them.
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
## 2017-04-10 ENCOUNTER — Encounter (HOSPITAL_COMMUNITY): Payer: Self-pay | Admitting: Psychiatry

## 2017-04-10 ENCOUNTER — Ambulatory Visit (INDEPENDENT_AMBULATORY_CARE_PROVIDER_SITE_OTHER): Payer: BC Managed Care – PPO | Admitting: Psychiatry

## 2017-04-10 VITALS — BP 135/89 | HR 74 | Ht 62.5 in | Wt 279.0 lb

## 2017-04-10 DIAGNOSIS — Z818 Family history of other mental and behavioral disorders: Secondary | ICD-10-CM

## 2017-04-10 DIAGNOSIS — F4312 Post-traumatic stress disorder, chronic: Secondary | ICD-10-CM

## 2017-04-10 DIAGNOSIS — S069X1S Unspecified intracranial injury with loss of consciousness of 30 minutes or less, sequela: Secondary | ICD-10-CM

## 2017-04-10 DIAGNOSIS — G40909 Epilepsy, unspecified, not intractable, without status epilepticus: Secondary | ICD-10-CM

## 2017-04-10 MED ORDER — FLUOXETINE HCL 20 MG PO CAPS: 20.0000 mg | ORAL_CAPSULE | Freq: Every day | ORAL | 1 refills | Status: DC

## 2017-04-10 MED ORDER — TRAZODONE HCL 50 MG PO TABS: 50.0000 mg | ORAL_TABLET | Freq: Every evening | ORAL | 1 refills | Status: DC | PRN
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
## 2017-04-22 ENCOUNTER — Ambulatory Visit (HOSPITAL_COMMUNITY): Payer: Self-pay | Admitting: Licensed Clinical Social Worker

## 2017-04-25 ENCOUNTER — Encounter (HOSPITAL_COMMUNITY): Payer: Self-pay | Admitting: Licensed Clinical Social Worker

## 2017-04-25 ENCOUNTER — Ambulatory Visit (INDEPENDENT_AMBULATORY_CARE_PROVIDER_SITE_OTHER): Payer: BC Managed Care – PPO | Admitting: Licensed Clinical Social Worker

## 2017-04-25 DIAGNOSIS — F4312 Post-traumatic stress disorder, chronic: Secondary | ICD-10-CM | POA: Diagnosis not present

## 2017-04-25 DIAGNOSIS — S069X1S Unspecified intracranial injury with loss of consciousness of 30 minutes or less, sequela: Secondary | ICD-10-CM | POA: Diagnosis not present
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
## 2017-05-01 ENCOUNTER — Ambulatory Visit: Payer: BC Managed Care – PPO | Admitting: Adult Health

## 2017-05-09 DIAGNOSIS — Z0271 Encounter for disability determination: Secondary | ICD-10-CM

## 2017-05-14 ENCOUNTER — Encounter (HOSPITAL_COMMUNITY): Payer: Self-pay | Admitting: Licensed Clinical Social Worker

## 2017-05-14 ENCOUNTER — Ambulatory Visit (INDEPENDENT_AMBULATORY_CARE_PROVIDER_SITE_OTHER): Payer: BC Managed Care – PPO | Admitting: Licensed Clinical Social Worker

## 2017-05-14 DIAGNOSIS — F4312 Post-traumatic stress disorder, chronic: Secondary | ICD-10-CM | POA: Diagnosis not present

## 2017-05-14 DIAGNOSIS — S069X1S Unspecified intracranial injury with loss of consciousness of 30 minutes or less, sequela: Secondary | ICD-10-CM
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
## 2017-06-03 ENCOUNTER — Encounter (HOSPITAL_COMMUNITY): Payer: Self-pay | Admitting: Psychiatry

## 2017-06-03 ENCOUNTER — Ambulatory Visit (INDEPENDENT_AMBULATORY_CARE_PROVIDER_SITE_OTHER): Payer: BC Managed Care – PPO | Admitting: Psychiatry

## 2017-06-03 VITALS — BP 128/74 | HR 66 | Ht 62.5 in | Wt 282.2 lb

## 2017-06-03 DIAGNOSIS — S069X1S Unspecified intracranial injury with loss of consciousness of 30 minutes or less, sequela: Secondary | ICD-10-CM | POA: Diagnosis not present

## 2017-06-03 DIAGNOSIS — F4312 Post-traumatic stress disorder, chronic: Secondary | ICD-10-CM | POA: Diagnosis not present

## 2017-06-03 DIAGNOSIS — Z79899 Other long term (current) drug therapy: Secondary | ICD-10-CM | POA: Diagnosis not present

## 2017-06-03 DIAGNOSIS — Z818 Family history of other mental and behavioral disorders: Secondary | ICD-10-CM

## 2017-06-03 MED ORDER — FLUOXETINE HCL 20 MG PO CAPS: 20.0000 mg | ORAL_CAPSULE | Freq: Every day | ORAL | 1 refills | Status: DC

## 2017-06-03 MED ORDER — TRAZODONE HCL 50 MG PO TABS: 50.0000 mg | ORAL_TABLET | Freq: Every evening | ORAL | 1 refills | Status: DC | PRN
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
## 2017-06-10 ENCOUNTER — Ambulatory Visit (HOSPITAL_COMMUNITY): Payer: Self-pay | Admitting: Licensed Clinical Social Worker

## 2017-06-25 ENCOUNTER — Ambulatory Visit (HOSPITAL_COMMUNITY): Payer: Self-pay | Admitting: Licensed Clinical Social Worker

## 2017-07-01 ENCOUNTER — Other Ambulatory Visit
Admission: RE | Admit: 2017-07-01 | Discharge: 2017-07-01 | Disposition: A | Discharge status: Home / Self Care | Payer: BC Managed Care – PPO | Source: Ambulatory Visit | Attending: Student | Admitting: Student

## 2017-07-01 DIAGNOSIS — Z8619 Personal history of other infectious and parasitic diseases: Secondary | ICD-10-CM | POA: Diagnosis present

## 2017-07-01 LAB — GASTROINTESTINAL PANEL BY PCR, STOOL (REPLACES STOOL CULTURE)
ADENOVIRUS F40/41: NOT DETECTED
Astrovirus: NOT DETECTED
CAMPYLOBACTER SPECIES: NOT DETECTED
CRYPTOSPORIDIUM: NOT DETECTED
CYCLOSPORA CAYETANENSIS: NOT DETECTED
ENTEROAGGREGATIVE E COLI (EAEC): NOT DETECTED
ENTEROPATHOGENIC E COLI (EPEC): NOT DETECTED
Entamoeba histolytica: NOT DETECTED
Enterotoxigenic E coli (ETEC): NOT DETECTED
GIARDIA LAMBLIA: NOT DETECTED
Norovirus GI/GII: NOT DETECTED
PLESIMONAS SHIGELLOIDES: NOT DETECTED
Rotavirus A: NOT DETECTED
SHIGA LIKE TOXIN PRODUCING E COLI (STEC): NOT DETECTED
Salmonella species: NOT DETECTED
Sapovirus (I, II, IV, and V): NOT DETECTED
Shigella/Enteroinvasive E coli (EIEC): NOT DETECTED
VIBRIO SPECIES: NOT DETECTED
Vibrio cholerae: NOT DETECTED
YERSINIA ENTEROCOLITICA: NOT DETECTED

## 2017-07-01 LAB — C DIFFICILE QUICK SCREEN W PCR REFLEX
C DIFFICILE (CDIFF) INTERP: NOT DETECTED
C Diff antigen: NEGATIVE
C Diff toxin: NEGATIVE

## 2017-07-02 ENCOUNTER — Encounter: Payer: Self-pay | Admitting: Neurology

## 2017-07-02 ENCOUNTER — Ambulatory Visit: Payer: BC Managed Care – PPO | Admitting: Neurology

## 2017-07-02 ENCOUNTER — Telehealth: Payer: Self-pay | Admitting: Neurology

## 2017-07-02 VITALS — BP 129/76 | HR 59 | Ht 62.0 in | Wt 283.0 lb

## 2017-07-02 DIAGNOSIS — G43111 Migraine with aura, intractable, with status migrainosus: Secondary | ICD-10-CM | POA: Diagnosis not present

## 2017-07-02 DIAGNOSIS — G40909 Epilepsy, unspecified, not intractable, without status epilepticus: Secondary | ICD-10-CM | POA: Insufficient documentation

## 2017-07-02 DIAGNOSIS — G44321 Chronic post-traumatic headache, intractable: Secondary | ICD-10-CM

## 2017-07-02 DIAGNOSIS — R569 Unspecified convulsions: Secondary | ICD-10-CM | POA: Insufficient documentation

## 2017-07-02 MED ORDER — SUMATRIPTAN SUCCINATE 50 MG PO TABS: 50.0000 mg | ORAL_TABLET | ORAL | 1 refills | Status: DC | PRN

## 2017-07-02 MED ORDER — FREMANEZUMAB-VFRM 225 MG/1.5ML ~~LOC~~ SOSY: 1.5000 mL | PREFILLED_SYRINGE | SUBCUTANEOUS | Status: DC

## 2017-07-02 MED ORDER — CLONAZEPAM 1 MG PO TABS: 1.0000 mg | ORAL_TABLET | ORAL | 0 refills | Status: DC | PRN

## 2017-07-02 MED ORDER — TOPIRAMATE 100 MG PO TABS: 100.0000 mg | ORAL_TABLET | Freq: Two times a day (BID) | ORAL | 3 refills | Status: DC

## 2017-07-02 MED ORDER — FREMANEZUMAB-VFRM 225 MG/1.5ML ~~LOC~~ SOSY: 225.0000 mg | PREFILLED_SYRINGE | SUBCUTANEOUS | 11 refills | Status: DC

## 2017-07-02 MED ORDER — LAMOTRIGINE 150 MG PO TABS: 150.0000 mg | ORAL_TABLET | Freq: Two times a day (BID) | ORAL | 3 refills | Status: DC

## 2017-07-02 MED ORDER — GABAPENTIN 300 MG PO CAPS: 300.0000 mg | ORAL_CAPSULE | Freq: Every day | ORAL | 3 refills | Status: DC
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
## 2017-07-02 NOTE — Telephone Encounter (Signed)
SAMPLE OF AJOVY GIVEN TODAY IN OFFICE. [place]: [phone] LOT# [id number] EXP DATE: TGR 0301

## 2017-07-03 ENCOUNTER — Telehealth: Payer: Self-pay | Admitting: Neurology

## 2017-07-03 LAB — H. PYLORI ANTIGEN, STOOL: H. Pylori Stool Ag, Eia: POSITIVE — AB

## 2017-07-03 NOTE — Telephone Encounter (Signed)
Sam @ [employer] is asking for a call from Kennan re: the PA for pt's Fremanezumab-vfrm (AJOVY) 225 MG/1.5ML SOSY, please call him at [phone]

## 2017-07-03 NOTE — Telephone Encounter (Signed)
Called back and they informed they will send a copy thru fax to complete her PA as it can not be accepted electronically

## 2017-07-05 NOTE — Telephone Encounter (Signed)
She is good for a month- she got the sample injection and there is no second one until 30 days later... We are working on it. CD

## 2017-07-05 NOTE — Telephone Encounter (Signed)
Pt states she just received a call from [facility] that they are still waiting on the PA for the Ajovy.  The last entry from the 7th was realyed to pt.

## 2017-07-07 LAB — CALPROTECTIN, FECAL: Calprotectin, Fecal: 46 ug/g (ref 0–120)

## 2017-07-15 NOTE — Telephone Encounter (Signed)
[facility] has approved for coverage of Ajovy. 07/15/2017-07/15/2018.  [phone] RH

## 2017-08-12 ENCOUNTER — Encounter (HOSPITAL_COMMUNITY): Payer: Self-pay | Admitting: Psychiatry

## 2017-08-12 ENCOUNTER — Ambulatory Visit (INDEPENDENT_AMBULATORY_CARE_PROVIDER_SITE_OTHER): Payer: BC Managed Care – PPO | Admitting: Psychiatry

## 2017-08-12 ENCOUNTER — Other Ambulatory Visit
Admission: RE | Admit: 2017-08-12 | Discharge: 2017-08-12 | Disposition: A | Discharge status: Home / Self Care | Payer: BC Managed Care – PPO | Source: Ambulatory Visit | Attending: Student | Admitting: Student

## 2017-08-12 VITALS — BP 132/78 | HR 70 | Ht 62.5 in | Wt 279.0 lb

## 2017-08-12 DIAGNOSIS — F4312 Post-traumatic stress disorder, chronic: Secondary | ICD-10-CM | POA: Diagnosis not present

## 2017-08-12 DIAGNOSIS — Z79899 Other long term (current) drug therapy: Secondary | ICD-10-CM

## 2017-08-12 DIAGNOSIS — S069X1S Unspecified intracranial injury with loss of consciousness of 30 minutes or less, sequela: Secondary | ICD-10-CM | POA: Diagnosis not present

## 2017-08-12 DIAGNOSIS — Z818 Family history of other mental and behavioral disorders: Secondary | ICD-10-CM

## 2017-08-12 DIAGNOSIS — R197 Diarrhea, unspecified: Secondary | ICD-10-CM | POA: Diagnosis present

## 2017-08-12 LAB — GASTROINTESTINAL PANEL BY PCR, STOOL (REPLACES STOOL CULTURE)
Adenovirus F40/41: NOT DETECTED
Astrovirus: NOT DETECTED
CAMPYLOBACTER SPECIES: NOT DETECTED
CRYPTOSPORIDIUM: NOT DETECTED
Cyclospora cayetanensis: NOT DETECTED
ENTEROPATHOGENIC E COLI (EPEC): NOT DETECTED
Entamoeba histolytica: NOT DETECTED
Enteroaggregative E coli (EAEC): NOT DETECTED
Enterotoxigenic E coli (ETEC): NOT DETECTED
Giardia lamblia: NOT DETECTED
Norovirus GI/GII: NOT DETECTED
PLESIMONAS SHIGELLOIDES: NOT DETECTED
ROTAVIRUS A: NOT DETECTED
SALMONELLA SPECIES: NOT DETECTED
SHIGA LIKE TOXIN PRODUCING E COLI (STEC): NOT DETECTED
SHIGELLA/ENTEROINVASIVE E COLI (EIEC): NOT DETECTED
Sapovirus (I, II, IV, and V): NOT DETECTED
Vibrio cholerae: NOT DETECTED
Vibrio species: NOT DETECTED
YERSINIA ENTEROCOLITICA: NOT DETECTED

## 2017-08-12 LAB — C DIFFICILE QUICK SCREEN W PCR REFLEX
C DIFFICILE (CDIFF) INTERP: NOT DETECTED
C DIFFICILE (CDIFF) TOXIN: NEGATIVE
C Diff antigen: NEGATIVE

## 2017-08-12 MED ORDER — FLUOXETINE HCL 20 MG PO CAPS: 20.0000 mg | ORAL_CAPSULE | Freq: Every day | ORAL | 1 refills | Status: DC

## 2017-08-12 MED ORDER — TRAZODONE HCL 50 MG PO TABS: 50.0000 mg | ORAL_TABLET | Freq: Every evening | ORAL | 1 refills | Status: DC | PRN
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
## 2017-08-14 ENCOUNTER — Ambulatory Visit (INDEPENDENT_AMBULATORY_CARE_PROVIDER_SITE_OTHER): Payer: BC Managed Care – PPO | Admitting: Licensed Clinical Social Worker

## 2017-08-14 ENCOUNTER — Encounter (HOSPITAL_COMMUNITY): Payer: Self-pay | Admitting: Licensed Clinical Social Worker

## 2017-08-14 DIAGNOSIS — F4312 Post-traumatic stress disorder, chronic: Secondary | ICD-10-CM | POA: Diagnosis not present
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
## 2017-08-23 ENCOUNTER — Other Ambulatory Visit: Payer: Self-pay | Admitting: Student

## 2017-08-23 DIAGNOSIS — M542 Cervicalgia: Principal | ICD-10-CM

## 2017-08-23 DIAGNOSIS — G8929 Other chronic pain: Secondary | ICD-10-CM

## 2017-08-23 DIAGNOSIS — M545 Low back pain: Secondary | ICD-10-CM

## 2017-08-28 ENCOUNTER — Encounter (HOSPITAL_COMMUNITY): Payer: Self-pay

## 2017-08-28 ENCOUNTER — Ambulatory Visit (HOSPITAL_COMMUNITY): Payer: BC Managed Care – PPO

## 2017-08-28 ENCOUNTER — Ambulatory Visit (HOSPITAL_COMMUNITY)
Admission: RE | Admit: 2017-08-28 | Discharge: 2017-08-28 | Disposition: A | Discharge status: Home / Self Care | Payer: BC Managed Care – PPO | Source: Ambulatory Visit | Attending: Student | Admitting: Student

## 2017-08-28 DIAGNOSIS — G8929 Other chronic pain: Secondary | ICD-10-CM | POA: Diagnosis present

## 2017-08-28 DIAGNOSIS — M545 Low back pain: Secondary | ICD-10-CM | POA: Insufficient documentation

## 2017-08-28 DIAGNOSIS — M542 Cervicalgia: Secondary | ICD-10-CM | POA: Diagnosis present

## 2017-08-28 DIAGNOSIS — M5186 Other intervertebral disc disorders, lumbar region: Secondary | ICD-10-CM | POA: Insufficient documentation

## 2017-09-02 ENCOUNTER — Ambulatory Visit: Payer: BC Managed Care – PPO | Admitting: Anesthesiology

## 2017-09-02 ENCOUNTER — Ambulatory Visit
Admission: RE | Admit: 2017-09-02 | Discharge: 2017-09-02 | Disposition: A | Discharge status: Home / Self Care | Payer: BC Managed Care – PPO | Source: Ambulatory Visit | Attending: Unknown Physician Specialty | Admitting: Unknown Physician Specialty

## 2017-09-02 ENCOUNTER — Encounter: Payer: Self-pay | Admitting: *Deleted

## 2017-09-02 ENCOUNTER — Encounter
Admission: RE | Disposition: A | Discharge status: Home / Self Care | Payer: Self-pay | Source: Ambulatory Visit | Attending: Unknown Physician Specialty

## 2017-09-02 DIAGNOSIS — R7303 Prediabetes: Secondary | ICD-10-CM | POA: Insufficient documentation

## 2017-09-02 DIAGNOSIS — R1084 Generalized abdominal pain: Secondary | ICD-10-CM | POA: Diagnosis present

## 2017-09-02 DIAGNOSIS — B9681 Helicobacter pylori [H. pylori] as the cause of diseases classified elsewhere: Secondary | ICD-10-CM | POA: Insufficient documentation

## 2017-09-02 DIAGNOSIS — K219 Gastro-esophageal reflux disease without esophagitis: Secondary | ICD-10-CM | POA: Insufficient documentation

## 2017-09-02 DIAGNOSIS — K529 Noninfective gastroenteritis and colitis, unspecified: Secondary | ICD-10-CM | POA: Insufficient documentation

## 2017-09-02 DIAGNOSIS — F419 Anxiety disorder, unspecified: Secondary | ICD-10-CM | POA: Insufficient documentation

## 2017-09-02 DIAGNOSIS — K640 First degree hemorrhoids: Secondary | ICD-10-CM | POA: Diagnosis not present

## 2017-09-02 DIAGNOSIS — G43909 Migraine, unspecified, not intractable, without status migrainosus: Secondary | ICD-10-CM | POA: Insufficient documentation

## 2017-09-02 DIAGNOSIS — K2950 Unspecified chronic gastritis without bleeding: Secondary | ICD-10-CM | POA: Insufficient documentation

## 2017-09-02 DIAGNOSIS — Z6841 Body Mass Index (BMI) 40.0 and over, adult: Secondary | ICD-10-CM | POA: Insufficient documentation

## 2017-09-02 DIAGNOSIS — Z881 Allergy status to other antibiotic agents status: Secondary | ICD-10-CM | POA: Insufficient documentation

## 2017-09-02 DIAGNOSIS — Z9049 Acquired absence of other specified parts of digestive tract: Secondary | ICD-10-CM | POA: Insufficient documentation

## 2017-09-02 DIAGNOSIS — Z8673 Personal history of transient ischemic attack (TIA), and cerebral infarction without residual deficits: Secondary | ICD-10-CM | POA: Diagnosis not present

## 2017-09-02 DIAGNOSIS — Z79899 Other long term (current) drug therapy: Secondary | ICD-10-CM | POA: Diagnosis not present

## 2017-09-02 DIAGNOSIS — G40909 Epilepsy, unspecified, not intractable, without status epilepticus: Secondary | ICD-10-CM | POA: Insufficient documentation

## 2017-09-02 DIAGNOSIS — G8929 Other chronic pain: Secondary | ICD-10-CM | POA: Diagnosis not present

## 2017-09-02 DIAGNOSIS — Z9109 Other allergy status, other than to drugs and biological substances: Secondary | ICD-10-CM | POA: Insufficient documentation

## 2017-09-02 DIAGNOSIS — J45909 Unspecified asthma, uncomplicated: Secondary | ICD-10-CM | POA: Diagnosis not present

## 2017-09-02 DIAGNOSIS — I4891 Unspecified atrial fibrillation: Secondary | ICD-10-CM | POA: Insufficient documentation

## 2017-09-02 DIAGNOSIS — E559 Vitamin D deficiency, unspecified: Secondary | ICD-10-CM | POA: Diagnosis not present

## 2017-09-02 DIAGNOSIS — M519 Unspecified thoracic, thoracolumbar and lumbosacral intervertebral disc disorder: Secondary | ICD-10-CM | POA: Insufficient documentation

## 2017-09-02 DIAGNOSIS — Z79890 Hormone replacement therapy (postmenopausal): Secondary | ICD-10-CM | POA: Insufficient documentation

## 2017-09-02 DIAGNOSIS — Z880 Allergy status to penicillin: Secondary | ICD-10-CM | POA: Diagnosis not present

## 2017-09-02 DIAGNOSIS — Z90710 Acquired absence of both cervix and uterus: Secondary | ICD-10-CM | POA: Diagnosis not present

## 2017-09-02 DIAGNOSIS — M5090 Cervical disc disorder, unspecified, unspecified cervical region: Secondary | ICD-10-CM | POA: Diagnosis not present

## 2017-09-02 DIAGNOSIS — Z9889 Other specified postprocedural states: Secondary | ICD-10-CM | POA: Insufficient documentation

## 2017-09-02 HISTORY — PX: ESOPHAGOGASTRODUODENOSCOPY (EGD) WITH PROPOFOL: SHX5813

## 2017-09-02 HISTORY — PX: COLONOSCOPY WITH PROPOFOL: SHX5780

## 2017-09-02 SURGERY — COLONOSCOPY WITH PROPOFOL
Anesthesia: General

## 2017-09-02 MED ORDER — SODIUM CHLORIDE 0.9 % IV SOLN
INTRAVENOUS | Status: DC
  Administered 2017-09-02: 14:00:00 via INTRAVENOUS

## 2017-09-02 MED ORDER — SODIUM CHLORIDE 0.9 % IV SOLN: INTRAVENOUS | Status: DC

## 2017-09-02 MED ORDER — PROPOFOL 500 MG/50ML IV EMUL
INTRAVENOUS | Status: DC | PRN
  Administered 2017-09-02: 50 ug/kg/min via INTRAVENOUS

## 2017-09-02 MED ORDER — FENTANYL CITRATE (PF) 100 MCG/2ML IJ SOLN
INTRAMUSCULAR | Status: DC | PRN
  Administered 2017-09-02: 50 ug via INTRAVENOUS
  Administered 2017-09-02 (×2): 25 ug via INTRAVENOUS

## 2017-09-02 MED ORDER — FENTANYL CITRATE (PF) 100 MCG/2ML IJ SOLN
INTRAMUSCULAR | Status: AC
  Filled 2017-09-02: qty 2

## 2017-09-02 MED ORDER — PROPOFOL 10 MG/ML IV BOLUS
INTRAVENOUS | Status: DC | PRN
  Administered 2017-09-02: 40 mg via INTRAVENOUS
  Administered 2017-09-02: 10 mg via INTRAVENOUS

## 2017-09-02 MED ORDER — MIDAZOLAM HCL 5 MG/5ML IJ SOLN
INTRAMUSCULAR | Status: DC | PRN
  Administered 2017-09-02 (×2): 1 mg via INTRAVENOUS

## 2017-09-02 MED ORDER — LIDOCAINE HCL (PF) 2 % IJ SOLN
INTRAMUSCULAR | Status: AC
  Filled 2017-09-02: qty 10

## 2017-09-02 MED ORDER — MIDAZOLAM HCL 2 MG/2ML IJ SOLN
INTRAMUSCULAR | Status: AC
  Filled 2017-09-02: qty 2

## 2017-09-02 MED ORDER — PROPOFOL 10 MG/ML IV BOLUS
INTRAVENOUS | Status: AC
  Filled 2017-09-02: qty 20

## 2017-09-02 MED ORDER — LIDOCAINE HCL (PF) 2 % IJ SOLN
INTRAMUSCULAR | Status: DC | PRN
  Administered 2017-09-02: 100 mg

## 2017-09-02 MED ORDER — GLYCOPYRROLATE 0.2 MG/ML IJ SOLN
INTRAMUSCULAR | Status: DC | PRN
  Administered 2017-09-02: 0.2 mg via INTRAVENOUS
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
## 2017-09-02 NOTE — Anesthesia Post-op Follow-up Note (Signed)
Anesthesia QCDR form completed.        

## 2017-09-02 NOTE — Op Note (Signed)
[facility] Patient Name: [name] [name] Procedure Date: 09/02/2017 2:29 PM MRN: [id number] Account #: [id number] Date of Birth: 1966-07-15 Admit Type: Outpatient Age: 52 Room: [facility] ENDO ROOM 3 Gender: Female Note Status: Finalized Procedure:            Upper GI endoscopy Indications:          Generalized abdominal pain Providers:            Manya Silvas, MD Referring MD:         Irven Easterly. Kary Kos, MD (Referring MD) Medicines:            Propofol per Anesthesia Complications:        No immediate complications. Procedure:            Pre-Anesthesia Assessment:                       - After reviewing the risks and benefits, the patient                        was deemed in satisfactory condition to undergo the                        procedure.                       After obtaining informed consent, the endoscope was                        passed under direct vision. Throughout the procedure,                        the patient's blood pressure, pulse, and oxygen                        saturations were monitored continuously. The Endoscope                        was introduced through the mouth, and advanced to the                        second part of duodenum. The upper GI endoscopy was                        accomplished without difficulty. The patient tolerated                        the procedure well. Findings:      The examined esophagus was normal. GEJ 38cm.      Diffuse and patchy mild inflammation characterized by erythema and       granularity was found in the gastric body and in the gastric antrum.       Biopsies were taken with a cold forceps for histology. Biopsies were       taken with a cold forceps for Helicobacter pylori testing.      Diffuse mildly erythematous mucosa without active bleeding and with no       stigmata of bleeding was found in the duodenal bulb. Biopsies were taken       with a cold forceps for  histology. Impression:           - Normal esophagus.                       -  Gastritis. Biopsied. Recommendation:       - Await pathology results. Take medication for the                        gastritis. Manya Silvas, MD 09/02/2017 2:44:04 PM This report has been signed electronically. Number of Addenda: 0 Note Initiated On: 09/02/2017 2:29 PM      [facility]

## 2017-09-02 NOTE — Op Note (Signed)
[facility] Patient Name: [name] [name] Procedure Date: 09/02/2017 2:29 PM MRN: [id number] Account #: [id number] Date of Birth: Nov 19, 1965 Admit Type: Outpatient Age: 52 Room: [facility] ENDO ROOM 3 Gender: Female Note Status: Finalized Procedure:            Colonoscopy Indications:          Generalized abdominal pain Providers:            Manya Silvas, MD Referring MD:         Irven Easterly. Kary Kos, MD (Referring MD) Medicines:            Propofol per Anesthesia Complications:        No immediate complications. Procedure:            Pre-Anesthesia Assessment:                       - After reviewing the risks and benefits, the patient                        was deemed in satisfactory condition to undergo the                        procedure.                       After obtaining informed consent, the colonoscope was                        passed under direct vision. Throughout the procedure,                        the patient's blood pressure, pulse, and oxygen                        saturations were monitored continuously. The                        Colonoscope was introduced through the anus and                        advanced to the the cecum, identified by appendiceal                        orifice and ileocecal valve. The colonoscopy was                        performed without difficulty. The patient tolerated the                        procedure well. The quality of the bowel preparation                        was excellent. Findings:      Internal hemorrhoids were found during endoscopy. The hemorrhoids were       small and Grade I (internal hemorrhoids that do not prolapse).      Ileocecal valve appeared mildly swollen with grannular mucosa so I       biopsied it twice to rule out neoplasm.      The exam was otherwise without abnormality. Impression:           - Internal hemorrhoids.                       -  The examination was otherwise  normal.                       - No specimens collected. Recommendation:       - Await pathology results. Manya Silvas, MD 09/02/2017 3:05:33 PM This report has been signed electronically. Number of Addenda: 0 Note Initiated On: 09/02/2017 2:29 PM Scope Withdrawal Time: 0 hours 10 minutes 15 seconds  Total Procedure Duration: 0 hours 15 minutes 19 seconds       [facility]

## 2017-09-02 NOTE — Transfer of Care (Signed)
Immediate Anesthesia Transfer of Care Note  Patient: [name] [name]  Procedure(s) Performed: COLONOSCOPY WITH PROPOFOL (N/A ) ESOPHAGOGASTRODUODENOSCOPY (EGD) WITH PROPOFOL (N/A )  Patient Location: PACU  Anesthesia Type:General  Level of Consciousness: sedated  Airway & Oxygen Therapy: Patient Spontanous Breathing and Patient connected to nasal cannula oxygen  Post-op Assessment: Report given to RN and Post -op Vital signs reviewed and stable  Post vital signs: Reviewed and stable  Last Vitals:  Vitals:   09/02/17 1400 09/02/17 1505  BP: 137/88   Pulse: 66 85  Resp: 14 13  Temp: (!) 36.1 C (!) 35.7 C  SpO2: 100% 100%    Last Pain:  Vitals:   09/02/17 1505  TempSrc: Tympanic  PainSc:          Complications: No apparent anesthesia complications

## 2017-09-03 ENCOUNTER — Encounter: Payer: Self-pay | Admitting: Unknown Physician Specialty

## 2017-09-03 NOTE — Anesthesia Postprocedure Evaluation (Signed)
Anesthesia Post Note  Patient: Laney Louderback [place]  Procedure(s) Performed: COLONOSCOPY WITH PROPOFOL (N/A ) ESOPHAGOGASTRODUODENOSCOPY (EGD) WITH PROPOFOL (N/A )  Patient location during evaluation: Endoscopy Anesthesia Type: General Level of consciousness: awake and alert Pain management: pain level controlled Vital Signs Assessment: post-procedure vital signs reviewed and stable Respiratory status: spontaneous breathing, nonlabored ventilation, respiratory function stable and patient connected to nasal cannula oxygen Cardiovascular status: blood pressure returned to baseline and stable Postop Assessment: no apparent nausea or vomiting Anesthetic complications: no     Last Vitals:  Vitals:   09/02/17 1515 09/02/17 1525  BP: 129/84 126/78  Pulse: 68 (!) 58  Resp: 14 13  Temp:    SpO2: 100% 100%    Last Pain:  Vitals:   09/03/17 0829  TempSrc:   PainSc: 0-No pain                 Martha Clan

## 2017-09-04 LAB — SURGICAL PATHOLOGY

## 2017-09-10 ENCOUNTER — Other Ambulatory Visit: Payer: Self-pay

## 2017-09-10 ENCOUNTER — Ambulatory Visit
Payer: BC Managed Care – PPO | Attending: Student in an Organized Health Care Education/Training Program | Admitting: Student in an Organized Health Care Education/Training Program

## 2017-09-10 ENCOUNTER — Encounter: Payer: Self-pay | Admitting: Student in an Organized Health Care Education/Training Program

## 2017-09-10 VITALS — BP 134/73 | HR 74 | Temp 98.2°F | Resp 16 | Ht 62.5 in | Wt 280.0 lb

## 2017-09-10 DIAGNOSIS — M47816 Spondylosis without myelopathy or radiculopathy, lumbar region: Secondary | ICD-10-CM

## 2017-09-10 DIAGNOSIS — R7303 Prediabetes: Secondary | ICD-10-CM | POA: Insufficient documentation

## 2017-09-10 DIAGNOSIS — G894 Chronic pain syndrome: Secondary | ICD-10-CM | POA: Diagnosis not present

## 2017-09-10 DIAGNOSIS — Z8673 Personal history of transient ischemic attack (TIA), and cerebral infarction without residual deficits: Secondary | ICD-10-CM | POA: Diagnosis not present

## 2017-09-10 DIAGNOSIS — M51379 Other intervertebral disc degeneration, lumbosacral region without mention of lumbar back pain or lower extremity pain: Secondary | ICD-10-CM

## 2017-09-10 DIAGNOSIS — K432 Incisional hernia without obstruction or gangrene: Secondary | ICD-10-CM | POA: Insufficient documentation

## 2017-09-10 DIAGNOSIS — Z79890 Hormone replacement therapy (postmenopausal): Secondary | ICD-10-CM | POA: Insufficient documentation

## 2017-09-10 DIAGNOSIS — Z6841 Body Mass Index (BMI) 40.0 and over, adult: Secondary | ICD-10-CM | POA: Insufficient documentation

## 2017-09-10 DIAGNOSIS — M545 Low back pain, unspecified: Secondary | ICD-10-CM

## 2017-09-10 DIAGNOSIS — E559 Vitamin D deficiency, unspecified: Secondary | ICD-10-CM | POA: Insufficient documentation

## 2017-09-10 DIAGNOSIS — Z79899 Other long term (current) drug therapy: Secondary | ICD-10-CM | POA: Insufficient documentation

## 2017-09-10 DIAGNOSIS — K219 Gastro-esophageal reflux disease without esophagitis: Secondary | ICD-10-CM | POA: Diagnosis not present

## 2017-09-10 DIAGNOSIS — M79601 Pain in right arm: Secondary | ICD-10-CM | POA: Diagnosis not present

## 2017-09-10 DIAGNOSIS — M5137 Other intervertebral disc degeneration, lumbosacral region: Secondary | ICD-10-CM | POA: Diagnosis not present

## 2017-09-10 DIAGNOSIS — M79602 Pain in left arm: Secondary | ICD-10-CM | POA: Insufficient documentation

## 2017-09-10 DIAGNOSIS — M542 Cervicalgia: Secondary | ICD-10-CM | POA: Diagnosis not present

## 2017-09-10 DIAGNOSIS — F419 Anxiety disorder, unspecified: Secondary | ICD-10-CM | POA: Insufficient documentation

## 2017-09-10 DIAGNOSIS — M5417 Radiculopathy, lumbosacral region: Secondary | ICD-10-CM | POA: Diagnosis not present

## 2017-09-10 DIAGNOSIS — M1288 Other specific arthropathies, not elsewhere classified, other specified site: Secondary | ICD-10-CM | POA: Insufficient documentation

## 2017-09-10 MED ORDER — TIZANIDINE HCL 4 MG PO CAPS: 4.0000 mg | ORAL_CAPSULE | Freq: Three times a day (TID) | ORAL | 0 refills | Status: DC | PRN

## 2017-09-10 NOTE — Progress Notes (Signed)
Safety precautions to be maintained throughout the outpatient stay will include: orient to surroundings, keep bed in low position, maintain call bell within reach at all times, provide assistance with transfer out of bed and ambulation.  

## 2017-09-10 NOTE — Patient Instructions (Signed)
Facet Joint Block The facet joints connect the bones of the spine (vertebrae). They make it possible for you to bend, twist, and make other movements with your spine. They also keep you from bending too far, twisting too far, and making other excessive movements. A facet joint block is a procedure where a numbing medicine (anesthetic) is injected into a facet joint. Often, a type of anti-inflammatory medicine called a steroid is also injected. A facet joint block may be done to diagnose neck or back pain. If the pain gets better after a facet joint block, it means the pain is probably coming from the facet joint. If the pain does not get better, it means the pain is probably not coming from the facet joint. A facet joint block may also be done to relieve neck or back pain caused by an inflamed facet joint. A facet joint block is only done to relieve pain if the pain does not improve with other methods, such as medicine, exercise programs, and physical therapy. Tell a health care provider about:  Any allergies you have.  All medicines you are taking, including vitamins, herbs, eye drops, creams, and over-the-counter medicines.  Any problems you or family members have had with anesthetic medicines.  Any blood disorders you have.  Any surgeries you have had.  Any medical conditions you have.  Whether you are pregnant or may be pregnant. What are the risks? Generally, this is a safe procedure. However, problems may occur, including:  Bleeding.  Injury to a nerve near the injection site.  Pain at the injection site.  Weakness or numbness in areas controlled by nerves near the injection site.  Infection.  Temporary fluid retention.  Allergic reactions to medicines or dyes.  Injury to other structures or organs near the injection site.  What happens before the procedure?  Follow instructions from your health care provider about eating or drinking restrictions.  Ask your health care  provider about: ? Changing or stopping your regular medicines. This is especially important if you are taking diabetes medicines or blood thinners. ? Taking medicines such as aspirin and ibuprofen. These medicines can thin your blood. Do not take these medicines before your procedure if your health care provider instructs you not to.  Do not take any new dietary supplements or medicines without asking your health care provider first.  Plan to have someone take you home after the procedure. What happens during the procedure?  You may need to remove your clothing and dress in an open-back gown.  The procedure will be done while you are lying on an X-ray table. You will most likely be asked to lie on your stomach, but you may be asked to lie in a different position if an injection will be made in your neck.  Machines will be used to monitor your oxygen levels, heart rate, and blood pressure.  If an injection will be made in your neck, an IV tube will be inserted into one of your veins. Fluids and medicine will flow directly into your body through the IV tube.  The area over the facet joint where the injection will be made will be cleaned with soap. The surrounding skin will be covered with clean drapes.  A numbing medicine (local anesthetic) will be applied to your skin. Your skin may sting or burn for a moment.  A video X-ray machine (fluoroscopy) will be used to locate the joint. In some cases, a CT scan may be used.  A  contrast dye may be injected into the facet joint area to help locate the joint.  When the joint is located, an anesthetic will be injected into the joint through the needle.  Your health care provider will ask you whether you feel pain relief. If you do feel relief, a steroid may be injected to provide pain relief for a longer period of time. If you do not feel relief or feel only partial relief, additional injections of an anesthetic may be made in other facet  joints.  The needle will be removed.  Your skin will be cleaned.  A bandage (dressing) will be applied over each injection site. The procedure may vary among health care providers and hospitals. What happens after the procedure?  You will be observed for 15-30 minutes before being allowed to go home. This information is not intended to replace advice given to you by your health care provider. Make sure you discuss any questions you have with your health care provider. Document Released: 01/02/2007 Document Revised: 09/14/2015 Document Reviewed: 05/09/2015 Elsevier Interactive Patient Education  2018 [place]  What are the risk, side effects and possible complications? Generally speaking, most procedures are safe.  However, with any procedure there are risks, side effects, and the possibility of complications.  The risks and complications are dependent upon the sites that are lesioned, or the type of nerve block to be performed.  The closer the procedure is to the spine, the more serious the risks are.  Great care is taken when placing the radio frequency needles, block needles or lesioning probes, but sometimes complications can occur. 1. Infection: Any time there is an injection through the skin, there is a risk of infection.  This is why sterile conditions are used for these blocks.  There are four possible types of infection. 1. Localized skin infection. 2. Central Nervous System Infection-This can be in the form of Meningitis, which can be deadly. 3. Epidural Infections-This can be in the form of an epidural abscess, which can cause pressure inside of the spine, causing compression of the spinal cord with subsequent paralysis. This would require an emergency surgery to decompress, and there are no guarantees that the patient would recover from the paralysis. 4. Discitis-This is an infection of the intervertebral discs.  It occurs in about 1% of  discography procedures.  It is difficult to treat and it may lead to surgery.        2. Pain: the needles have to go through skin and soft tissues, will cause soreness.       3. Damage to internal structures:  The nerves to be lesioned may be near blood vessels or    other nerves which can be potentially damaged.       4. Bleeding: Bleeding is more common if the patient is taking blood thinners such as  aspirin, Coumadin, Ticiid, Plavix, etc., or if he/she have some genetic predisposition  such as hemophilia. Bleeding into the spinal canal can cause compression of the spinal  cord with subsequent paralysis.  This would require an emergency surgery to  decompress and there are no guarantees that the patient would recover from the  paralysis.       5. Pneumothorax:  Puncturing of a lung is a possibility, every time a needle is introduced in  the area of the chest or upper back.  Pneumothorax refers to free air around the  collapsed lung(s), inside of the thoracic cavity (chest cavity).  Another two possible  complications related to a similar event would include: Hemothorax and Chylothorax.   These are variations of the Pneumothorax, where instead of air around the collapsed  lung(s), you may have blood or chyle, respectively.       6. Spinal headaches: They may occur with any procedures in the area of the spine.       7. Persistent CSF (Cerebro-Spinal Fluid) leakage: This is a rare problem, but may occur  with prolonged intrathecal or epidural catheters either due to the formation of a fistulous  track or a dural tear.       8. Nerve damage: By working so close to the spinal cord, there is always a possibility of  nerve damage, which could be as serious as a permanent spinal cord injury with  paralysis.       9. Death:  Although rare, severe deadly allergic reactions known as "Anaphylactic  reaction" can occur to any of the medications used.      10. Worsening of the symptoms:  We can always make thing  worse.  What are the chances of something like this happening? Chances of any of this occuring are extremely low.  By statistics, you have more of a chance of getting killed in a motor vehicle accident: while driving to the hospital than any of the above occurring .  Nevertheless, you should be aware that they are possibilities.  In general, it is similar to taking a shower.  Everybody knows that you can slip, hit your head and get killed.  Does that mean that you should not shower again?  Nevertheless always keep in mind that statistics do not mean anything if you happen to be on the wrong side of them.  Even if a procedure has a 1 (one) in a 1,000,000 (million) chance of going wrong, it you happen to be that one..Also, keep in mind that by statistics, you have more of a chance of having something go wrong when taking medications.  Who should not have this procedure? If you are on a blood thinning medication (e.g. Coumadin, Plavix, see list of "Blood Thinners"), or if you have an active infection going on, you should not have the procedure.  If you are taking any blood thinners, please inform your physician.  How should I prepare for this procedure?  Do not eat or drink anything at least six hours prior to the procedure.  Bring a driver with you .  It cannot be a taxi.  Come accompanied by an adult that can drive you back, and that is strong enough to help you if your legs get weak or numb from the local anesthetic.  Take all of your medicines the morning of the procedure with just enough water to swallow them.  If you have diabetes, make sure that you are scheduled to have your procedure done first thing in the morning, whenever possible.  If you have diabetes, take only half of your insulin dose and notify our nurse that you have done so as soon as you arrive at the clinic.  If you are diabetic, but only take blood sugar pills (oral hypoglycemic), then do not take them on the morning of your  procedure.  You may take them after you have had the procedure.  Do not take aspirin or any aspirin-containing medications, at least eleven (11) days prior to the procedure.  They may prolong bleeding.  Wear loose fitting clothing that may be easy to  take off and that you would not mind if it got stained with Betadine or blood.  Do not wear any jewelry or perfume  Remove any nail coloring.  It will interfere with some of our monitoring equipment.  NOTE: Remember that this is not meant to be interpreted as a complete list of all possible complications.  Unforeseen problems may occur.  BLOOD THINNERS The following drugs contain aspirin or other products, which can cause increased bleeding during surgery and should not be taken for 2 weeks prior to and 1 week after surgery.  If you should need take something for relief of minor pain, you may take acetaminophen which is found in Tylenol,m Datril, Anacin-3 and Panadol. It is not blood thinner. The products listed below are.  Do not take any of the products listed below in addition to any listed on your instruction sheet.  A.P.C or A.P.C with Codeine Codeine Phosphate Capsules #3 Ibuprofen Ridaura  ABC compound Congesprin Imuran rimadil  Advil Cope Indocin Robaxisal  Alka-Seltzer Effervescent Pain Reliever and Antacid Coricidin or Coricidin-D  Indomethacin Rufen  Alka-Seltzer plus Cold Medicine Cosprin Ketoprofen S-A-C Tablets  Anacin Analgesic Tablets or Capsules Coumadin Korlgesic Salflex  Anacin Extra Strength Analgesic tablets or capsules CP-2 Tablets Lanoril Salicylate  Anaprox Cuprimine Capsules Levenox Salocol  Anexsia-D Dalteparin Magan Salsalate  Anodynos Darvon compound Magnesium Salicylate Sine-off  Ansaid Dasin Capsules Magsal Sodium Salicylate  Anturane Depen Capsules Marnal Soma  APF Arthritis pain formula Dewitt's Pills Measurin Stanback  Argesic Dia-Gesic Meclofenamic Sulfinpyrazone  Arthritis Bayer Timed Release Aspirin  Diclofenac Meclomen Sulindac  Arthritis pain formula Anacin Dicumarol Medipren Supac  Analgesic (Safety coated) Arthralgen Diffunasal Mefanamic Suprofen  Arthritis Strength Bufferin Dihydrocodeine Mepro Compound Suprol  Arthropan liquid Dopirydamole Methcarbomol with Aspirin Synalgos  ASA tablets/Enseals Disalcid Micrainin Tagament  Ascriptin Doan's Midol Talwin  Ascriptin A/D Dolene Mobidin Tanderil  Ascriptin Extra Strength Dolobid Moblgesic Ticlid  Ascriptin with Codeine Doloprin or Doloprin with Codeine Momentum Tolectin  Asperbuf Duoprin Mono-gesic Trendar  Aspergum Duradyne Motrin or Motrin IB Triminicin  Aspirin plain, buffered or enteric coated Durasal Myochrisine Trigesic  Aspirin Suppositories Easprin Nalfon Trillsate  Aspirin with Codeine Ecotrin Regular or Extra Strength Naprosyn Uracel  Atromid-S Efficin Naproxen Ursinus  Auranofin Capsules Elmiron Neocylate Vanquish  Axotal Emagrin Norgesic Verin  Azathioprine Empirin or Empirin with Codeine Normiflo Vitamin E  Azolid Emprazil Nuprin Voltaren  Bayer Aspirin plain, buffered or children's or timed BC Tablets or powders Encaprin Orgaran Warfarin Sodium  Buff-a-Comp Enoxaparin Orudis Zorpin  Buff-a-Comp with Codeine Equegesic Os-Cal-Gesic   Buffaprin Excedrin plain, buffered or Extra Strength Oxalid   Bufferin Arthritis Strength Feldene Oxphenbutazone   Bufferin plain or Extra Strength Feldene Capsules Oxycodone with Aspirin   Bufferin with Codeine Fenoprofen Fenoprofen Pabalate or Pabalate-SF   Buffets II Flogesic Panagesic   Buffinol plain or Extra Strength Florinal or Florinal with Codeine Panwarfarin   Buf-Tabs Flurbiprofen Penicillamine   Butalbital Compound Four-way cold tablets Penicillin   Butazolidin Fragmin Pepto-Bismol   Carbenicillin Geminisyn Percodan   Carna Arthritis Reliever Geopen Persantine   Carprofen Gold's salt Persistin   Chloramphenicol Goody's Phenylbutazone   Chloromycetin Haltrain Piroxlcam    Clmetidine heparin Plaquenil   Cllnoril Hyco-pap Ponstel   Clofibrate Hydroxy chloroquine Propoxyphen         Before stopping any of these medications, be sure to consult the physician who ordered them.  Some, such as Coumadin (Warfarin) are ordered to prevent or treat serious conditions such as "deep thrombosis", "pumonary  embolisms", and other heart problems.  The amount of time that you may need off of the medication may also vary with the medication and the reason for which you were taking it.  If you are taking any of these medications, please make sure you notify your pain physician before you undergo any procedures.

## 2017-09-10 NOTE — Progress Notes (Signed)
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 encounter of 02/05/16  MR Brain Wo Contrast   Narrative   CLINICAL DATA:  Woke up at 4 a.m. with severe substernal and RIGHT-sided chest pain. Severe headache, LEFT extremity weakness. History of seizures, traumatic brain injury, atrial fibrillation, migraines.  EXAM: MRI HEAD WITHOUT CONTRAST  TECHNIQUE: Multiplanar, multiecho pulse sequences of the brain and surrounding structures were obtained without intravenous contrast.  COMPARISON:  CT HEAD February 05, 2016 at 1525 hours and MRI of the brain April 23, 2015  FINDINGS: INTRACRANIAL CONTENTS: No reduced diffusion to suggest acute ischemia. No susceptibility artifact to suggest hemorrhage. The ventricles and sulci are normal for patient's age. No suspicious parenchymal signal, masses or mass effect. No abnormal extra-axial fluid collections. No extra-axial masses though, contrast enhanced sequences would be more  sensitive. Normal major intracranial vascular flow voids present at skull base.  ORBITS: The included ocular globes and orbital contents are non-suspicious.  SINUSES: The mastoid air-cells and included paranasal sinuses are well-aerated.  SKULL/SOFT TISSUES: No abnormal sellar expansion. No suspicious calvarial bone marrow signal. Craniocervical junction maintained. Cerebellar tonsils at but not below the foramen magnum.  IMPRESSION: Negative MRI of the head.   Electronically Signed   By: Elon Alas M.D.   On: 02/05/2016 23:12   Results for orders placed or performed in visit on 03/18/14  MR Brain W Wo Contrast   Narrative   [facility]  NEUROIMAGING REPORT   STUDY DATE: 03/29/14 PATIENT NAME: MIRREN GEST DOB: 06-10-1966 MRN: [id number]  ORDERING CLINICIAN: [facility] [address] (1.5 Tesla  MRI)   CLINICAL HISTORY: 52 year old female with fall and dizziness.  EXAM: MRI brain (with and without)  TECHNIQUE: MRI of  the brain with and without contrast was obtained  utilizing 5 mm axial slices with T1, T2, T2 flair, SWI and diffusion  weighted views.  T1 sagittal, T2 coronal and postcontrast views in the  axial and coronal plane were obtained. CONTRAST: 39m multihance  [facility] [address]1.5 Tesla MRI)    FINDINGS:  No abnormal lesions are seen on diffusion-weighted views to suggest acute  ischemia. The cortical sulci, fissures and cisterns are normal in size and  appearance. Lateral, third and fourth ventricle are normal in size and  appearance. No extra-axial fluid collections are seen. No evidence of mass  effect or midline shift.    No abnormal lesions are seen on post contrast views.  On sagittal views  the posterior fossa, pituitary gland and corpus callosum are unremarkable.  No evidence of intracranial hemorrhage on SWI views. The orbits and their  contents, paranasal sinuses  and calvarium are unremarkable.  Intracranial  flow voids are present.     Impression   Normal MRI brain (with and without).   INTERPRETING PHYSICIAN:  VPenni Bombard MD Certified in Neurology, Neurophysiology and Neuroimaging  [facility] [address] [place] [phone]   Psychological-Psychiatric History: Psychiatric disorder, Anxiousness and Depressed Gastrointestinal History: Heartburn due to stomach pushing into lungs (Hiatal hernia) and Reflux or heatburn Genitourinary History: No reported renal or genitourinary signs or symptoms such as difficulty voiding or producing urine, peeing blood, non-functioning kidney, kidney stones, difficulty emptying the bladder, difficulty controlling the flow of urine, or chronic kidney disease Hematological History: No reported hematological signs or symptoms such as prolonged bleeding, low or poor functioning platelets, bruising or bleeding easily, hereditary bleeding problems, low energy levels due to low hemoglobin or being anemic Endocrine History: No reported endocrine signs or symptoms such as high or low blood sugar, rapid heart rate due to high thyroid levels, obesity or weight gain due to slow thyroid or thyroid disease Rheumatologic History: No reported rheumatological signs and symptoms such as fatigue, joint pain, tenderness, swelling, redness, heat, stiffness, decreased range of motion, with or without associated rash Musculoskeletal History: Negative for myasthenia gravis, muscular dystrophy, multiple sclerosis or malignant hyperthermia Work History: Retired  Allergies  [name] [name] is allergic to amoxicillin; penicillins; biaxin [clarithromycin]; erythromycin base; and zithromax [azithromycin].  Laboratory Chemistry  Inflammation Markers (CRP: Acute Phase) (ESR: Chronic Phase) Lab Results  Component Value Date   LATICACIDVEN 0.54 09/05/2014                 Rheumatology  Markers No results found for: RF, ANA, L[facility]isa [facility]             Renal Function Markers Lab Results  Component Value Date   BUN 19 04/07/2017   CREATININE 0.96 04/07/2017   GFRAA >60 04/07/2017   GFRNONAA >60 04/07/2017                 Hepatic Function Markers Lab Results  Component Value Date   AST 14 (L) 04/07/2017   ALT 15 04/07/2017   ALBUMIN 3.9 04/07/2017   ALKPHOS 106 04/07/2017   AMYLASE 54 11/18/2012   LIPASE 11 11/23/2016                 Electrolytes Lab Results  Component Value Date   NA 141 04/07/2017   K 3.8 04/07/2017   CL 115 (H) 04/07/2017   CALCIUM 9.0 04/07/2017   MG  1.8 09/26/2014                 Neuropathy Markers Lab Results  Component Value Date   VITAMINB12 491 09/10/2016   FOLATE >20.0 09/10/2016   HGBA1C 5.5 10/09/2016   HIV Non Reactive 10/09/2016                 Bone Pathology Markers Lab Results  Component Value Date   VD25OH 23.5 (L) 10/09/2016   VD125OH2TOT 47 03/09/2015   [id number] 24 03/09/2015   [id number] 23 03/09/2015                 Coagulation Parameters Lab Results  Component Value Date   INR 1.04 02/05/2016   LABPROT 13.8 02/05/2016   APTT 35 02/05/2016   PLT 187 04/07/2017   DDIMER  05/29/2007    0.34        AT THE INHOUSE ESTABLISHED CUTOFF VALUE OF 0.48 ug/mL FEU, THIS ASSAY HAS BEEN DOCUMENTED IN THE LITERATURE TO HAVE                 Cardiovascular Markers Lab Results  Component Value Date   BNP 30.9 10/01/2014   CKTOTAL 101 05/29/2007   CKMB 1.2 05/29/2007   TROPONINI <0.03 04/07/2017   HGB 13.4 04/07/2017   HCT 39.8 04/07/2017                 CA Markers No results found for: CEA, CA125, LABCA2               Note: Lab results reviewed.  PFSH  Drug: [name] [name]  reports that she does not use drugs. Alcohol:  reports that she does not drink alcohol. Tobacco:  reports that  has never smoked. she has never used smokeless tobacco. Medical:  has a past medical  history of Anginal pain ([place]), Anxiety, Asthma, Atrial fibrillation ([place]), Chest pain, Chronic back pain, Chronic bronchitis ([place]), Complication of anesthesia, Daily headache, Epilepsy ([place]), Fibroid, GERD (gastroesophageal reflux disease), Head injury (1975; 2015), Joint pain, Migraine, Obesity, Class III, BMI 40-49.9 (morbid obesity) ([place]) (12/21/2012), Ovarian cyst, Pre-diabetes, Seizures ([place]), Shortness of breath dyspnea, TBI (traumatic brain injury) ([place]) (2015), TIA (transient ischemic attack) (04/2015?), and Vitamin D deficiency. Family: family history includes Breast cancer in her cousin; COPD in her father; Depression in her mother; Diabetes in her father; Emphysema in her father; Heart attack in her father; Heart disease in her father and mother; Hypertension in her father and mother.  Past Surgical History:  Procedure Laterality Date  . ABDOMINAL HYSTERECTOMY  2005  . APPENDECTOMY  1990s  . CARDIAC CATHETERIZATION N/A 01/21/2015   Procedure: Left Heart Cath;  Surgeon: Yolonda Kida, MD;  Location: [facility];  Service: Cardiovascular;  Laterality: N/A;  . CESAREAN SECTION  1989  . COLONOSCOPY N/A 11/21/2012   Procedure: COLONOSCOPY;  Surgeon: Irene Shipper, MD;  Location: [facility]COPY;  Service: Endoscopy;  Laterality: N/A;  . COLONOSCOPY WITH PROPOFOL N/A 09/02/2017   Procedure: COLONOSCOPY WITH PROPOFOL;  Surgeon: Manya Silvas, MD;  Location[facility] ENDOSCOPY;  Service: Endoscopy;  Laterality: N/A;  . DILATION AND CURETTAGE OF UTERUS  1985  . ESOPHAGOGASTRODUODENOSCOPY N/A 11/21/2012   Procedure: ESOPHAGOGASTRODUODENOSCOPY (EGD);  Surgeon: Irene Shipper, MD;  Location: [facility];  Service: Endoscopy;  Laterality: N/A;  . ESOPHAGOGASTRODUODENOSCOPY (EGD) WITH PROPOFOL N/A 09/02/2017   Procedure: ESOPHAGOGASTRODUODENOSCOPY (EGD) WITH PROPOFOL;  Surgeon: Manya Silvas, MD;  Location: [facility] ENDOSCOPY;  Service: Endoscopy;  Laterality: N/A;  . HERNIA REPAIR    . INCISIONAL  HERNIA REPAIR N/A 12/17/2012   Procedure: LAPAROSCOPIC INCISIONAL HERNIA;  Surgeon: Harl Bowie, MD;  Location: [facility];  Service: General;  Laterality: N/A;  . INSERTION OF MESH N/A 12/17/2012   Procedure: INSERTION OF MESH;  Surgeon: Harl Bowie, MD;  Location: [facility];  Service: General;  Laterality: N/A;  . OVARIAN CYST REMOVAL  2007; 2014   Active Ambulatory Problems    Diagnosis Date Noted  . Incisional hernia, without obstruction or gangrene 11/06/2012  . GERD (gastroesophageal reflux disease)   . Obesity, Class III, BMI 40-49.9 (morbid obesity) ([place]) 12/21/2012  . 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 History of recent fall 03/09/2014  . Paresthesia 04/22/2015  . CVA (cerebral infarction) 04/22/2015  . Right sided weakness 04/22/2015  . Seizure disorder ([place]) 04/22/2015  . Chronic headaches 04/22/2015  . Atrial fibrillation ([place]) 04/22/2015  . Right arm weakness   . Asthma, mild intermittent   . TIA (transient ischemic attack) 04/25/2015  . Morbid obesity ([place])   . Intractable migraine with aura with status migrainosus 04/27/2015  . Chronic post-traumatic headache, not intractable 04/27/2015  . Migraine with aura and with status migrainosus, not intractable 07/20/2015  . Subarachnoid hemorrhage following injury, with loss of consciousness ([place]) 07/20/2015  . TBI (traumatic brain injury) ([place]) 07/20/2015  . Partial symptomatic epilepsy with complex partial seizures, not intractable, without status epilepticus ([place]) 07/20/2015  . Back pain at L4-L5 level 09/07/2015  . DDD (degenerative disc disease), lumbosacral 09/07/2015  . Lumbosacral radiculopathy 09/07/2015  . Surgical menopause on hormone replacement therapy 09/08/2015  . Incontinence in female 09/08/2015  . Status post TAH-BSO 10/09/2003  . Prediabetes 11/26/2016  . Vitamin D deficiency 11/26/2016  . Agitation 11/28/2016  . Pseudoseizure   . Seizure-like activity ([place])   . Seizures ()  12/26/2016  . Other parasomnia 12/26/2016  . Intractable migraine without aura and without status migrainosus 03/07/2017  . Nocturnal seizures ([place]) 07/02/2017   Resolved Ambulatory Problems    Diagnosis Date Noted  . No Resolved Ambulatory Problems   Past Medical History:  Diagnosis Date  . Anginal pain ([place])   . Anxiety   . Asthma   . Atrial fibrillation ([place])   . Chest pain   . Chronic back pain   . Chronic bronchitis ([place])   . Complication of anesthesia   . Daily headache   . Epilepsy ([place])   . Fibroid   . GERD (gastroesophageal reflux disease)   . Head injury 1975; 2015  . Joint pain   . Migraine   . Obesity, Class III, BMI 40-49.9 (morbid obesity) ([place]) 12/21/2012  . Ovarian cyst   . Pre-diabetes   . Seizures ([place])   . Shortness of breath dyspnea   . TBI (traumatic brain injury) ([place]) 2015  . TIA (transient ischemic attack) 04/2015?  Marland Kitchen Vitamin D deficiency    Constitutional Exam  General appearance: alert, cooperative and morbidly obese Vitals:   09/10/17 0919  BP: 134/73  Pulse: 74  Resp: 16  Temp: 98.2 F (36.8 C)  SpO2: 98%  Weight: 280 lb (127 kg)  Height: 5' 2.5" (1.588 m)   BMI Assessment: Estimated body mass index is 50.4 kg/m as calculated from the following:   Height as of this encounter: 5' 2.5" (1.588 m).   Weight as of this encounter: 280 lb (127 kg).  BMI interpretation table: BMI level Category Range association with higher incidence of  chronic pain  <18 kg/m2 Underweight   18.5-24.9 kg/m2 Ideal body weight   25-29.9 kg/m2 Overweight Increased incidence by 20%  30-34.9 kg/m2 Obese (Class I) Increased incidence by 68%  35-39.9 kg/m2 Severe obesity (Class II) Increased incidence by 136%  >40 kg/m2 Extreme obesity (Class III) Increased incidence by 254%   BMI Readings from Last 4 Encounters:  09/10/17 50.40 kg/m  09/02/17 50.40 kg/m  07/02/17 51.76 kg/m  04/07/17 49.14 kg/m   Wt Readings from Last 4 Encounters:  09/10/17 280 lb  (127 kg)  09/02/17 280 lb (127 kg)  07/02/17 283 lb (128.4 kg)  04/07/17 273 lb (123.8 kg)  Psych/Mental status: Alert, oriented x 3 (person, place, & time)       Eyes: PERLA Respiratory: No evidence of acute respiratory distress  Cervical Spine Area Exam  Skin & Axial Inspection: No masses, redness, edema, swelling, or associated skin lesions Alignment: Symmetrical Functional ROM: Unrestricted ROM      Stability: No instability detected Muscle Tone/Strength: Functionally intact. No obvious neuro-muscular anomalies detected. Sensory (Neurological): Unimpaired Palpation: Complains of area being tender to palpation              Upper Extremity (UE) Exam    Side: Right upper extremity  Side: Left upper extremity  Skin & Extremity Inspection: Skin color, temperature, and hair growth are WNL. No peripheral edema or cyanosis. No masses, redness, swelling, asymmetry, or associated skin lesions. No contractures.  Skin & Extremity Inspection: Skin color, temperature, and hair growth are WNL. No peripheral edema or cyanosis. No masses, redness, swelling, asymmetry, or associated skin lesions. No contractures.  Functional ROM: Unrestricted ROM          Functional ROM: Unrestricted ROM          Muscle Tone/Strength: Functionally intact. No obvious neuro-muscular anomalies detected.  Muscle Tone/Strength: Functionally intact. No obvious neuro-muscular anomalies detected.  Sensory (Neurological): Unimpaired          Sensory (Neurological): Unimpaired          Palpation: No palpable anomalies              Palpation: No palpable anomalies              Specialized Test(s): Deferred         Specialized Test(s): Deferred          Thoracic Spine Area Exam  Skin & Axial Inspection: No masses, redness, or swelling Alignment: Symmetrical Functional ROM: Unrestricted ROM Stability: No instability detected Muscle Tone/Strength: Functionally intact. No obvious neuro-muscular anomalies detected. Sensory  (Neurological): Unimpaired Muscle strength & Tone: No palpable anomalies  Lumbar Spine Area Exam  Skin & Axial Inspection: No masses, redness, or swelling Alignment: Symmetrical Functional ROM: Unrestricted ROM      Stability: No instability detected Muscle Tone/Strength: Functionally intact. No obvious neuro-muscular anomalies detected. Sensory (Neurological): Articular pain pattern Palpation: Complains of area being tender to palpation       Provocative Tests: Lumbar Hyperextension and rotation test: Positive bilaterally for facet joint pain. Lumbar Lateral bending test: Positive due to pain. Patrick's Maneuver: evaluation deferred today                    Gait & Posture Assessment  Ambulation: Unassisted Gait: Relatively normal for age and body habitus Posture: WNL   Lower Extremity Exam    Side: Right lower extremity  Side: Left lower extremity  Skin & Extremity Inspection: Skin color, temperature, and hair growth are WNL.  No peripheral edema or cyanosis. No masses, redness, swelling, asymmetry, or associated skin lesions. No contractures.  Skin & Extremity Inspection: Skin color, temperature, and hair growth are WNL. No peripheral edema or cyanosis. No masses, redness, swelling, asymmetry, or associated skin lesions. No contractures.  Functional ROM: Unrestricted ROM          Functional ROM: Unrestricted ROM          Muscle Tone/Strength: Functionally intact. No obvious neuro-muscular anomalies detected.  Muscle Tone/Strength: Functionally intact. No obvious neuro-muscular anomalies detected.  Sensory (Neurological): Unimpaired  Sensory (Neurological): Unimpaired  Palpation: No palpable anomalies  Palpation: No palpable anomalies   Assessment  Primary Diagnosis & Pertinent Problem List: The primary encounter diagnosis was Lumbar facet arthropathy. Diagnoses of Back pain at L4-L5 level, DDD (degenerative disc disease), lumbosacral, and Chronic pain syndrome were also pertinent to  this visit.  Visit Diagnosis (New problems to examiner): 1. Lumbar facet arthropathy   2. Back pain at L4-L5 level   3. DDD (degenerative disc disease), lumbosacral   4. Chronic pain syndrome     General Recommendations: The pain condition that the patient suffers from is best treated with a multidisciplinary approach that involves an increase in physical activity to prevent de-conditioning and worsening of the pain cycle, as well as psychological counseling (formal and/or informal) to address the co-morbid psychological affects of pain. Treatment will often involve judicious use of pain medications and interventional procedures to decrease the pain, allowing the patient to participate in the physical activity that will ultimately produce long-lasting pain reductions. The goal of the multidisciplinary approach is to return the patient to a higher level of overall function and to restore their ability to perform activities of daily living.  52 year old female with a history of chronic axial low back pain that radiates into bilateral legs along with neck pain that started approximately 3-1/2 years ago after sustaining an [place] that resulted in traumatic brain injury and ICU stay for approximately 3 days.  Patient's back pain, leg pain and neck pain have gotten worse over the last couple of years.  She has had previous epidural steroid injections the last 1 of which was in July 2017 and was not effective.  Patient has been evaluated by neurosurgery in December 2018.  Lumbar and cervical MRIs were ordered.  Cervical MRI was largely unremarkable, lumbar MRI primarily showed facet arthropathy at L4-L5 and L5-S1.  Patient does have low back pain that is reproducible with axial loading and lateral rotation suggesting a facet mediated pain pattern.  Furthermore the patient does complain of cervical and trapezius muscle spasms in the evening.  Tizanidine may be a helpful medication for this.   Furthermore, patient has been on gabapentin 300 mill grams nightly.  She states that higher doses of this medication have resulted in bedwetting even though she does have occasional bedwetting with current dose of 300 mill grams nightly.  Patient has tried Lyrica in the past and was not effective.  She is on Topamax 100 mg twice daily for her migraine management.  She is currently on an SSRI so want to avoid therapy with Cymbalta or Effexor.  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  For her muscle spasms, will prescribe tizanidine.  For her lumbar facet arthropathy and spondylosis as evidenced on MRI, clinical and physical exam, we discussed bilateral diagnostic lumbar  facet blocks with sedation.  Risks and benefits were discussed and patient would like to proceed.  I will also obtain UDS for this patient in consideration of opioid therapy if other conservative measures and non-opioid analgesics are not effective.  Plan: -UDS today. -Scheduled for bilateral lumbar facets, L3-L5 with sedation with steroid -Tizanidine as below. -Continue gabapentin 300 mg nightly, Topamax 100 mg twice daily. -Continue care with psychiatrist and therapist.   Provider-requested follow-up: Return in about 2 weeks (around 09/24/2017) for Procedure.  Future Appointments  Date Time Provider [place]  09/11/2017  3:00 PM Archie Balboa, LCAS-A [facility] None  10/10/2017  8:45 AM Defrancesco, Alanda Slim, MD [facility] None  11/04/2017 10:00 AM Daron Offer Richard Miu, MD [facility] None  12/30/2017 11:00 AM Dohmeier, Asencion Partridge, MD [facility] None    Primary Care Physician: [name], MD Location: [facility] Note by: [name], M.D, Date: 09/10/2017; Time: 10:31 AM  Patient Instructions   Facet Joint Block The facet joints connect the bones of the spine  (vertebrae). They make it possible for you to bend, twist, and make other movements with your spine. They also keep you from bending too far, twisting too far, and making other excessive movements. A facet joint block is a procedure where a numbing medicine (anesthetic) is injected into a facet joint. Often, a type of anti-inflammatory medicine called a steroid is also injected. A facet joint block may be done to diagnose neck or back pain. If the pain gets better after a facet joint block, it means the pain is probably coming from the facet joint. If the pain does not get better, it means the pain is probably not coming from the facet joint. A facet joint block may also be done to relieve neck or back pain caused by an inflamed facet joint. A facet joint block is only done to relieve pain if the pain does not improve with other methods, such as medicine, exercise programs, and physical therapy. Tell a health care provider about:  Any allergies you have.  All medicines you are taking, including vitamins, herbs, eye drops, creams, and over-the-counter medicines.  Any problems you or family members have had with anesthetic medicines.  Any blood disorders you have.  Any surgeries you have had.  Any medical conditions you have.  Whether you are pregnant or may be pregnant. What are the risks? Generally, this is a safe procedure. However, problems may occur, including:  Bleeding.  Injury to a nerve near the injection site.  Pain at the injection site.  Weakness or numbness in areas controlled by nerves near the injection site.  Infection.  Temporary fluid retention.  Allergic reactions to medicines or dyes.  Injury to other structures or organs near the injection site.  What happens before the procedure?  Follow instructions from your health care provider about eating or drinking restrictions.  Ask your health care provider about: ? Changing or stopping your regular medicines.  This is especially important if you are taking diabetes medicines or blood thinners. ? Taking medicines such as aspirin and ibuprofen. These medicines can thin your blood. Do not take these medicines before your procedure if your health care provider instructs you not to.  Do not take any new dietary supplements or medicines without asking your health care provider first.  Plan to have someone take you home after the procedure. What happens during the procedure?  You may need to remove your clothing and dress in an open-back  gown.  The procedure will be done while you are lying on an X-ray table. You will most likely be asked to lie on your stomach, but you may be asked to lie in a different position if an injection will be made in your neck.  Machines will be used to monitor your oxygen levels, heart rate, and blood pressure.  If an injection will be made in your neck, an IV tube will be inserted into one of your veins. Fluids and medicine will flow directly into your body through the IV tube.  The area over the facet joint where the injection will be made will be cleaned with soap. The surrounding skin will be covered with clean drapes.  A numbing medicine (local anesthetic) will be applied to your skin. Your skin may sting or burn for a moment.  A video X-ray machine (fluoroscopy) will be used to locate the joint. In some cases, a CT scan may be used.  A contrast dye may be injected into the facet joint area to help locate the joint.  When the joint is located, an anesthetic will be injected into the joint through the needle.  Your health care provider will ask you whether you feel pain relief. If you do feel relief, a steroid may be injected to provide pain relief for a longer period of time. If you do not feel relief or feel only partial relief, additional injections of an anesthetic may be made in other facet joints.  The needle will be removed.  Your skin will be cleaned.  A  bandage (dressing) will be applied over each injection site. The procedure may vary among health care providers and hospitals. What happens after the procedure?  You will be observed for 15-30 minutes before being allowed to go home. This information is not intended to replace advice given to you by your health care provider. Make sure you discuss any questions you have with your health care provider. Document Released: 01/02/2007 Document Revised: 09/14/2015 Document Reviewed: 05/09/2015 Elsevier Interactive Patient Education  2018 [place]  What are the risk, side effects and possible complications? Generally speaking, most procedures are safe.  However, with any procedure there are risks, side effects, and the possibility of complications.  The risks and complications are dependent upon the sites that are lesioned, or the type of nerve block to be performed.  The closer the procedure is to the spine, the more serious the risks are.  Great care is taken when placing the radio frequency needles, block needles or lesioning probes, but sometimes complications can occur. 1. Infection: Any time there is an injection through the skin, there is a risk of infection.  This is why sterile conditions are used for these blocks.  There are four possible types of infection. 1. Localized skin infection. 2. Central Nervous System Infection-This can be in the form of Meningitis, which can be deadly. 3. Epidural Infections-This can be in the form of an epidural abscess, which can cause pressure inside of the spine, causing compression of the spinal cord with subsequent paralysis. This would require an emergency surgery to decompress, and there are no guarantees that the patient would recover from the paralysis. 4. Discitis-This is an infection of the intervertebral discs.  It occurs in about 1% of discography procedures.  It is difficult to treat and it may lead to surgery.         2. Pain: the needles have to go through skin  and soft tissues, will cause soreness.       3. Damage to internal structures:  The nerves to be lesioned may be near blood vessels or    other nerves which can be potentially damaged.       4. Bleeding: Bleeding is more common if the patient is taking blood thinners such as  aspirin, Coumadin, Ticiid, Plavix, etc., or if he/she have some genetic predisposition  such as hemophilia. Bleeding into the spinal canal can cause compression of the spinal  cord with subsequent paralysis.  This would require an emergency surgery to  decompress and there are no guarantees that the patient would recover from the  paralysis.       5. Pneumothorax:  Puncturing of a lung is a possibility, every time a needle is introduced in  the area of the chest or upper back.  Pneumothorax refers to free air around the  collapsed lung(s), inside of the thoracic cavity (chest cavity).  Another two possible  complications related to a similar event would include: Hemothorax and Chylothorax.   These are variations of the Pneumothorax, where instead of air around the collapsed  lung(s), you may have blood or chyle, respectively.       6. Spinal headaches: They may occur with any procedures in the area of the spine.       7. Persistent CSF (Cerebro-Spinal Fluid) leakage: This is a rare problem, but may occur  with prolonged intrathecal or epidural catheters either due to the formation of a fistulous  track or a dural tear.       8. Nerve damage: By working so close to the spinal cord, there is always a possibility of  nerve damage, which could be as serious as a permanent spinal cord injury with  paralysis.       9. Death:  Although rare, severe deadly allergic reactions known as "Anaphylactic  reaction" can occur to any of the medications used.      10. Worsening of the symptoms:  We can always make thing worse.  What are the chances of something like this happening? Chances of any of  this occuring are extremely low.  By statistics, you have more of a chance of getting killed in a motor vehicle accident: while driving to the hospital than any of the above occurring .  Nevertheless, you should be aware that they are possibilities.  In general, it is similar to taking a shower.  Everybody knows that you can slip, hit your head and get killed.  Does that mean that you should not shower again?  Nevertheless always keep in mind that statistics do not mean anything if you happen to be on the wrong side of them.  Even if a procedure has a 1 (one) in a 1,000,000 (million) chance of going wrong, it you happen to be that one..Also, keep in mind that by statistics, you have more of a chance of having something go wrong when taking medications.  Who should not have this procedure? If you are on a blood thinning medication (e.g. Coumadin, Plavix, see list of "Blood Thinners"), or if you have an active infection going on, you should not have the procedure.  If you are taking any blood thinners, please inform your physician.  How should I prepare for this procedure?  Do not eat or drink anything at least six hours prior to the procedure.  Bring a driver with you .  It cannot be a taxi.  Come accompanied by an adult that can drive you back, and that is strong enough to help you if your legs get weak or numb from the local anesthetic.  Take all of your medicines the morning of the procedure with just enough water to swallow them.  If you have diabetes, make sure that you are scheduled to have your procedure done first thing in the morning, whenever possible.  If you have diabetes, take only half of your insulin dose and notify our nurse that you have done so as soon as you arrive at the clinic.  If you are diabetic, but only take blood sugar pills (oral hypoglycemic), then do not take them on the morning of your procedure.  You may take them after you have had the procedure.  Do not take  aspirin or any aspirin-containing medications, at least eleven (11) days prior to the procedure.  They may prolong bleeding.  Wear loose fitting clothing that may be easy to take off and that you would not mind if it got stained with Betadine or blood.  Do not wear any jewelry or perfume  Remove any nail coloring.  It will interfere with some of our monitoring equipment.  NOTE: Remember that this is not meant to be interpreted as a complete list of all possible complications.  Unforeseen problems may occur.  BLOOD THINNERS The following drugs contain aspirin or other products, which can cause increased bleeding during surgery and should not be taken for 2 weeks prior to and 1 week after surgery.  If you should need take something for relief of minor pain, you may take acetaminophen which is found in Tylenol,m Datril, Anacin-3 and Panadol. It is not blood thinner. The products listed below are.  Do not take any of the products listed below in addition to any listed on your instruction sheet.  A.P.C or A.P.C with Codeine Codeine Phosphate Capsules #3 Ibuprofen Ridaura  ABC compound Congesprin Imuran rimadil  Advil Cope Indocin Robaxisal  Alka-Seltzer Effervescent Pain Reliever and Antacid Coricidin or Coricidin-D  Indomethacin Rufen  Alka-Seltzer plus Cold Medicine Cosprin Ketoprofen S-A-C Tablets  Anacin Analgesic Tablets or Capsules Coumadin Korlgesic Salflex  Anacin Extra Strength Analgesic tablets or capsules CP-2 Tablets Lanoril Salicylate  Anaprox Cuprimine Capsules Levenox Salocol  Anexsia-D Dalteparin Magan Salsalate  Anodynos Darvon compound Magnesium Salicylate Sine-off  Ansaid Dasin Capsules Magsal Sodium Salicylate  Anturane Depen Capsules Marnal Soma  APF Arthritis pain formula Dewitt's Pills Measurin Stanback  Argesic Dia-Gesic Meclofenamic Sulfinpyrazone  Arthritis Bayer Timed Release Aspirin Diclofenac Meclomen Sulindac  Arthritis pain formula Anacin Dicumarol Medipren  Supac  Analgesic (Safety coated) Arthralgen Diffunasal Mefanamic Suprofen  Arthritis Strength Bufferin Dihydrocodeine Mepro Compound Suprol  Arthropan liquid Dopirydamole Methcarbomol with Aspirin Synalgos  ASA tablets/Enseals Disalcid Micrainin Tagament  Ascriptin Doan's Midol Talwin  Ascriptin A/D Dolene Mobidin Tanderil  Ascriptin Extra Strength Dolobid Moblgesic Ticlid  Ascriptin with Codeine Doloprin or Doloprin with Codeine Momentum Tolectin  Asperbuf Duoprin Mono-gesic Trendar  Aspergum Duradyne Motrin or Motrin IB Triminicin  Aspirin plain, buffered or enteric coated Durasal Myochrisine Trigesic  Aspirin Suppositories Easprin Nalfon Trillsate  Aspirin with Codeine Ecotrin Regular or Extra Strength Naprosyn Uracel  Atromid-S Efficin Naproxen Ursinus  Auranofin Capsules Elmiron Neocylate Vanquish  Axotal Emagrin Norgesic Verin  Azathioprine Empirin or Empirin with Codeine Normiflo Vitamin E  Azolid Emprazil Nuprin Voltaren  Bayer Aspirin plain, buffered or children's or timed BC Tablets or powders Encaprin Orgaran Warfarin Sodium  Buff-a-Comp Enoxaparin Orudis Zorpin  Buff-a-Comp with Codeine Equegesic Os-Cal-Gesic   Buffaprin Excedrin plain, buffered or Extra Strength Oxalid   Bufferin Arthritis Strength Feldene Oxphenbutazone   Bufferin plain or Extra Strength Feldene Capsules Oxycodone with Aspirin   Bufferin with Codeine Fenoprofen Fenoprofen Pabalate or Pabalate-SF   Buffets II Flogesic Panagesic   Buffinol plain or Extra Strength Florinal or Florinal with Codeine Panwarfarin   Buf-Tabs Flurbiprofen Penicillamine   Butalbital Compound Four-way cold tablets Penicillin   Butazolidin Fragmin Pepto-Bismol   Carbenicillin Geminisyn Percodan   Carna Arthritis Reliever Geopen Persantine   Carprofen Gold's salt Persistin   Chloramphenicol Goody's Phenylbutazone   Chloromycetin Haltrain Piroxlcam   Clmetidine heparin Plaquenil   Cllnoril Hyco-pap Ponstel   Clofibrate Hydroxy  chloroquine Propoxyphen         Before stopping any of these medications, be sure to consult the physician who ordered them.  Some, such as Coumadin (Warfarin) are ordered to prevent or treat serious conditions such as "deep thrombosis", "pumonary embolisms", and other heart problems.  The amount of time that you may need off of the medication may also vary with the medication and the reason for which you were taking it.  If you are taking any of these medications, please make sure you notify your pain physician before you undergo any procedures.

## 2017-09-11 ENCOUNTER — Ambulatory Visit (HOSPITAL_COMMUNITY): Payer: Self-pay | Admitting: Licensed Clinical Social Worker

## 2017-09-11 ENCOUNTER — Encounter (HOSPITAL_COMMUNITY): Payer: Self-pay

## 2017-09-11 ENCOUNTER — Inpatient Hospital Stay (HOSPITAL_COMMUNITY)
Admission: AD | Admit: 2017-09-11 | Discharge: 2017-09-11 | Disposition: A | Discharge status: Home / Self Care | Payer: BC Managed Care – PPO | Source: Ambulatory Visit | Attending: Obstetrics and Gynecology | Admitting: Obstetrics and Gynecology

## 2017-09-11 DIAGNOSIS — F419 Anxiety disorder, unspecified: Secondary | ICD-10-CM | POA: Insufficient documentation

## 2017-09-11 DIAGNOSIS — K219 Gastro-esophageal reflux disease without esophagitis: Secondary | ICD-10-CM | POA: Diagnosis not present

## 2017-09-11 DIAGNOSIS — J45909 Unspecified asthma, uncomplicated: Secondary | ICD-10-CM | POA: Insufficient documentation

## 2017-09-11 DIAGNOSIS — N764 Abscess of vulva: Secondary | ICD-10-CM

## 2017-09-11 DIAGNOSIS — E559 Vitamin D deficiency, unspecified: Secondary | ICD-10-CM | POA: Insufficient documentation

## 2017-09-11 DIAGNOSIS — Z79899 Other long term (current) drug therapy: Secondary | ICD-10-CM | POA: Diagnosis not present

## 2017-09-11 DIAGNOSIS — Z880 Allergy status to penicillin: Secondary | ICD-10-CM | POA: Insufficient documentation

## 2017-09-11 DIAGNOSIS — G8929 Other chronic pain: Secondary | ICD-10-CM | POA: Diagnosis not present

## 2017-09-11 DIAGNOSIS — Z6841 Body Mass Index (BMI) 40.0 and over, adult: Secondary | ICD-10-CM | POA: Insufficient documentation

## 2017-09-11 DIAGNOSIS — I4891 Unspecified atrial fibrillation: Secondary | ICD-10-CM | POA: Insufficient documentation

## 2017-09-11 DIAGNOSIS — R7303 Prediabetes: Secondary | ICD-10-CM | POA: Insufficient documentation

## 2017-09-11 DIAGNOSIS — G40909 Epilepsy, unspecified, not intractable, without status epilepticus: Secondary | ICD-10-CM | POA: Diagnosis not present

## 2017-09-11 DIAGNOSIS — Z8673 Personal history of transient ischemic attack (TIA), and cerebral infarction without residual deficits: Secondary | ICD-10-CM | POA: Insufficient documentation

## 2017-09-11 LAB — URINALYSIS, ROUTINE W REFLEX MICROSCOPIC
Bilirubin Urine: NEGATIVE
GLUCOSE, UA: NEGATIVE mg/dL
Hgb urine dipstick: NEGATIVE
KETONES UR: NEGATIVE mg/dL
LEUKOCYTES UA: NEGATIVE
NITRITE: NEGATIVE
PROTEIN: NEGATIVE mg/dL
Specific Gravity, Urine: 1.021 (ref 1.005–1.030)
pH: 6 (ref 5.0–8.0)

## 2017-09-11 MED ORDER — SULFAMETHOXAZOLE-TRIMETHOPRIM 800-160 MG PO TABS: 1.0000 | ORAL_TABLET | Freq: Two times a day (BID) | ORAL | 0 refills | Status: DC
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
## 2017-09-11 NOTE — MAU Note (Signed)
Educated patient on signs/symptoms of infection and reinforced need to take entire course of antibiotics. Patient indicated understanding of all instructions.

## 2017-09-11 NOTE — Discharge Instructions (Signed)
Skin Abscess A skin abscess is an infected area on or under your skin that contains pus and other material. An abscess can happen almost anywhere on your body. Some abscesses break open (rupture) on their own. Most continue to get worse unless they are treated. The infection can spread deeper into the body and into your blood, which can make you feel sick. Treatment usually involves draining the abscess. Follow these instructions at home: Abscess Care  If you have an abscess that has not drained, place a warm, clean, wet washcloth over the abscess several times a day. Do this as told by your doctor.  Follow instructions from your doctor about how to take care of your abscess. Make sure you: ? Cover the abscess with a bandage (dressing). ? Change your bandage or gauze as told by your doctor. ? Wash your hands with soap and water before you change the bandage or gauze. If you cannot use soap and water, use hand sanitizer.  Check your abscess every day for signs that the infection is getting worse. Check for: ? More redness, swelling, or pain. ? More fluid or blood. ? Warmth. ? More pus or a bad smell. Medicines   Take over-the-counter and prescription medicines only as told by your doctor.  If you were prescribed an antibiotic medicine, take it as told by your doctor. Do not stop taking the antibiotic even if you start to feel better. General instructions  To avoid spreading the infection: ? Do not share personal care items, towels, or hot tubs with others. ? Avoid making skin-to-skin contact with other people.  Keep all follow-up visits as told by your doctor. This is important. Contact a doctor if:  You have more redness, swelling, or pain around your abscess.  You have more fluid or blood coming from your abscess.  Your abscess feels warm when you touch it.  You have more pus or a bad smell coming from your abscess.  You have a fever.  Your muscles ache.  You have  chills.  You feel sick. Get help right away if:  You have very bad (severe) pain.  You see red streaks on your skin spreading away from the abscess. This information is not intended to replace advice given to you by your health care provider. Make sure you discuss any questions you have with your health care provider. Document Released: 01/30/2008 Document Revised: 04/08/2016 Document Reviewed: 06/22/2015 Elsevier Interactive Patient Education  2018 [employer]  

## 2017-09-11 NOTE — MAU Note (Signed)
Pt reports a boil in her vaginal area that is very painful. There x 4 days, denies fever.

## 2017-09-17 LAB — COMPLIANCE DRUG ANALYSIS, UR

## 2017-09-26 ENCOUNTER — Other Ambulatory Visit: Payer: Self-pay | Admitting: Obstetrics and Gynecology
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
## 2017-10-07 ENCOUNTER — Other Ambulatory Visit
Admission: RE | Admit: 2017-10-07 | Discharge: 2017-10-07 | Disposition: A | Discharge status: Home / Self Care | Payer: BC Managed Care – PPO | Source: Ambulatory Visit | Attending: Internal Medicine | Admitting: Internal Medicine

## 2017-10-07 DIAGNOSIS — R197 Diarrhea, unspecified: Secondary | ICD-10-CM | POA: Diagnosis present

## 2017-10-07 LAB — GASTROINTESTINAL PANEL BY PCR, STOOL (REPLACES STOOL CULTURE)

## 2017-10-07 LAB — C DIFFICILE QUICK SCREEN W PCR REFLEX
C DIFFICLE (CDIFF) ANTIGEN: NEGATIVE
C Diff interpretation: NOT DETECTED
C Diff toxin: NEGATIVE

## 2017-10-07 LAB — LACTOFERRIN, FECAL, QUALITATIVE: Lactoferrin, Fecal, Qual: NEGATIVE

## 2017-10-10 ENCOUNTER — Encounter: Payer: BC Managed Care – PPO | Admitting: Obstetrics and Gynecology

## 2017-11-04 ENCOUNTER — Ambulatory Visit (HOSPITAL_COMMUNITY): Payer: Self-pay | Admitting: Psychiatry

## 2017-11-12 ENCOUNTER — Encounter (HOSPITAL_COMMUNITY): Payer: Self-pay | Admitting: Emergency Medicine

## 2017-11-12 ENCOUNTER — Emergency Department (HOSPITAL_COMMUNITY)
Admission: EM | Admit: 2017-11-12 | Discharge: 2017-11-12 | Disposition: A | Discharge status: Home / Self Care | Payer: BC Managed Care – PPO | Attending: Emergency Medicine | Admitting: Emergency Medicine

## 2017-11-12 ENCOUNTER — Other Ambulatory Visit: Payer: Self-pay

## 2017-11-12 DIAGNOSIS — Z87820 Personal history of traumatic brain injury: Secondary | ICD-10-CM | POA: Insufficient documentation

## 2017-11-12 DIAGNOSIS — G8929 Other chronic pain: Secondary | ICD-10-CM | POA: Diagnosis not present

## 2017-11-12 DIAGNOSIS — J45909 Unspecified asthma, uncomplicated: Secondary | ICD-10-CM | POA: Insufficient documentation

## 2017-11-12 DIAGNOSIS — Z79899 Other long term (current) drug therapy: Secondary | ICD-10-CM | POA: Insufficient documentation

## 2017-11-12 DIAGNOSIS — M545 Low back pain: Secondary | ICD-10-CM | POA: Diagnosis present

## 2017-11-12 MED ORDER — OXYCODONE HCL 5 MG PO TABS
5.0000 mg | ORAL_TABLET | Freq: Once | ORAL | Status: AC
  Administered 2017-11-12: 5 mg via ORAL
  Filled 2017-11-12: qty 1

## 2017-11-12 MED ORDER — OXYCODONE-ACETAMINOPHEN 5-325 MG PO TABS: 1.0000 | ORAL_TABLET | Freq: Once | ORAL | Status: DC
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
## 2017-11-12 NOTE — Discharge Instructions (Addendum)
Call your pain management doctor tomorrow for follow up.

## 2017-11-12 NOTE — ED Triage Notes (Signed)
Sever, non-traumatic back pain, pulling sharp pain that radiates bilaterally down her legs. Injury to back in 2015. Today, she was just walking and pain became more severe than usual.

## 2017-11-13 ENCOUNTER — Ambulatory Visit: Payer: BC Managed Care – PPO | Attending: Nurse Practitioner | Admitting: Nurse Practitioner

## 2017-11-13 ENCOUNTER — Ambulatory Visit
Admission: RE | Admit: 2017-11-13 | Discharge: 2017-11-13 | Disposition: A | Discharge status: Home / Self Care | Payer: BC Managed Care – PPO | Source: Ambulatory Visit | Attending: Nurse Practitioner | Admitting: Nurse Practitioner

## 2017-11-13 ENCOUNTER — Encounter: Payer: Self-pay | Admitting: Nurse Practitioner

## 2017-11-13 ENCOUNTER — Telehealth: Payer: Self-pay | Admitting: *Deleted

## 2017-11-13 DIAGNOSIS — N83209 Unspecified ovarian cyst, unspecified side: Secondary | ICD-10-CM | POA: Insufficient documentation

## 2017-11-13 DIAGNOSIS — E559 Vitamin D deficiency, unspecified: Secondary | ICD-10-CM | POA: Insufficient documentation

## 2017-11-13 DIAGNOSIS — Z90710 Acquired absence of both cervix and uterus: Secondary | ICD-10-CM | POA: Insufficient documentation

## 2017-11-13 DIAGNOSIS — M549 Dorsalgia, unspecified: Secondary | ICD-10-CM

## 2017-11-13 DIAGNOSIS — K219 Gastro-esophageal reflux disease without esophagitis: Secondary | ICD-10-CM | POA: Insufficient documentation

## 2017-11-13 DIAGNOSIS — R7303 Prediabetes: Secondary | ICD-10-CM | POA: Diagnosis not present

## 2017-11-13 DIAGNOSIS — S0990XA Unspecified injury of head, initial encounter: Secondary | ICD-10-CM | POA: Diagnosis not present

## 2017-11-13 DIAGNOSIS — Z888 Allergy status to other drugs, medicaments and biological substances status: Secondary | ICD-10-CM | POA: Diagnosis not present

## 2017-11-13 DIAGNOSIS — M5117 Intervertebral disc disorders with radiculopathy, lumbosacral region: Secondary | ICD-10-CM | POA: Diagnosis not present

## 2017-11-13 DIAGNOSIS — Z881 Allergy status to other antibiotic agents status: Secondary | ICD-10-CM | POA: Insufficient documentation

## 2017-11-13 DIAGNOSIS — J45909 Unspecified asthma, uncomplicated: Secondary | ICD-10-CM | POA: Diagnosis not present

## 2017-11-13 DIAGNOSIS — M5417 Radiculopathy, lumbosacral region: Secondary | ICD-10-CM | POA: Insufficient documentation

## 2017-11-13 DIAGNOSIS — I4891 Unspecified atrial fibrillation: Secondary | ICD-10-CM | POA: Diagnosis not present

## 2017-11-13 DIAGNOSIS — G40909 Epilepsy, unspecified, not intractable, without status epilepticus: Secondary | ICD-10-CM | POA: Insufficient documentation

## 2017-11-13 DIAGNOSIS — Z8249 Family history of ischemic heart disease and other diseases of the circulatory system: Secondary | ICD-10-CM | POA: Insufficient documentation

## 2017-11-13 DIAGNOSIS — M6281 Muscle weakness (generalized): Secondary | ICD-10-CM | POA: Diagnosis not present

## 2017-11-13 DIAGNOSIS — K432 Incisional hernia without obstruction or gangrene: Secondary | ICD-10-CM | POA: Insufficient documentation

## 2017-11-13 DIAGNOSIS — G43109 Migraine with aura, not intractable, without status migrainosus: Secondary | ICD-10-CM | POA: Diagnosis not present

## 2017-11-13 DIAGNOSIS — G894 Chronic pain syndrome: Secondary | ICD-10-CM | POA: Insufficient documentation

## 2017-11-13 DIAGNOSIS — F419 Anxiety disorder, unspecified: Secondary | ICD-10-CM | POA: Diagnosis not present

## 2017-11-13 DIAGNOSIS — G8929 Other chronic pain: Secondary | ICD-10-CM | POA: Diagnosis not present

## 2017-11-13 DIAGNOSIS — M4186 Other forms of scoliosis, lumbar region: Secondary | ICD-10-CM | POA: Insufficient documentation

## 2017-11-13 DIAGNOSIS — Z79899 Other long term (current) drug therapy: Secondary | ICD-10-CM | POA: Diagnosis not present

## 2017-11-13 DIAGNOSIS — Z825 Family history of asthma and other chronic lower respiratory diseases: Secondary | ICD-10-CM | POA: Insufficient documentation

## 2017-11-13 DIAGNOSIS — M47816 Spondylosis without myelopathy or radiculopathy, lumbar region: Secondary | ICD-10-CM

## 2017-11-13 DIAGNOSIS — Z880 Allergy status to penicillin: Secondary | ICD-10-CM | POA: Insufficient documentation

## 2017-11-13 DIAGNOSIS — R079 Chest pain, unspecified: Secondary | ICD-10-CM | POA: Insufficient documentation

## 2017-11-13 DIAGNOSIS — Z8673 Personal history of transient ischemic attack (TIA), and cerebral infarction without residual deficits: Secondary | ICD-10-CM | POA: Insufficient documentation

## 2017-11-13 DIAGNOSIS — Z803 Family history of malignant neoplasm of breast: Secondary | ICD-10-CM | POA: Insufficient documentation

## 2017-11-13 DIAGNOSIS — Z833 Family history of diabetes mellitus: Secondary | ICD-10-CM | POA: Insufficient documentation

## 2017-11-13 DIAGNOSIS — G459 Transient cerebral ischemic attack, unspecified: Secondary | ICD-10-CM | POA: Diagnosis not present

## 2017-11-13 DIAGNOSIS — Z9889 Other specified postprocedural states: Secondary | ICD-10-CM | POA: Insufficient documentation

## 2017-11-13 MED ORDER — KETOROLAC TROMETHAMINE 60 MG/2ML IM SOLN
60.0000 mg | Freq: Once | INTRAMUSCULAR | Status: DC
  Administered 2017-11-13: 60 mg via INTRAMUSCULAR
  Filled 2017-11-13: qty 2

## 2017-11-13 MED ORDER — ORPHENADRINE CITRATE 30 MG/ML IJ SOLN
60.0000 mg | Freq: Once | INTRAMUSCULAR | Status: DC
  Administered 2017-11-13: 60 mg via INTRAMUSCULAR
  Filled 2017-11-13: qty 2

## 2017-11-13 NOTE — Progress Notes (Addendum)
Patient's Name: [name] [name]  MRN: [id number]  Referring Provider: [name], MD  DOB: 1966-07-28  PCP: [name], MD  DOS: 11/13/2017  Note by: Vevelyn Francois NP  Service setting: Ambulatory outpatient  Specialty: Interventional Pain Management  Location: [facility] (AMB) Pain Management Facility    Patient type: Established    Primary Reason(s) for Visit: Evaluation of chronic illnesses with exacerbation, or progression (Level of risk: moderate) CC: Back Pain (mid thoracic to lower lumbar, buttocks and legs)  HPI  [name] [name] is a 52 y.o. year old, female patient, who comes today for a follow-up evaluation. She has Incisional hernia, without obstruction or gangrene; GERD (gastroesophageal reflux disease); Obesity, Class III, BMI 40-49.9 (morbid obesity) ([place]); Generalized convulsive epilepsy ([place]); History of recent fall; Paresthesia; CVA (cerebral infarction); Right sided weakness; Seizure disorder ([place]); Chronic headaches; Atrial fibrillation ([place]); Right arm weakness; Asthma, mild intermittent; TIA (transient ischemic attack); Morbid obesity ([place]); Intractable migraine with aura with status migrainosus; Chronic post-traumatic headache, not intractable; Migraine with aura and with status migrainosus, not intractable; Subarachnoid hemorrhage following injury, with loss of consciousness ([place]); TBI (traumatic brain injury) ([place]); Partial symptomatic epilepsy with complex partial seizures, not intractable, without status epilepticus ([place]); Headache; DDD (degenerative disc disease), lumbosacral; Lumbosacral radiculopathy; Surgical menopause on hormone replacement therapy; Incontinence in female; Status post TAH-BSO; Prediabetes; Vitamin D deficiency; Agitation; Pseudoseizure; Seizure-like activity ([place]); Seizures ([place]); Other parasomnia; Intractable migraine without aura and without status migrainosus; Nocturnal seizures ([place]); Anxiety; Asthma; Cerebral artery occlusion with cerebral infarction ([place]);  Generalized muscle weakness; History of CVA (cerebrovascular accident) without residual deficits; Other symptoms and signs involving the musculoskeletal system; Skin sensation disturbance; Migraine with aura; Migraines; Convulsions ([place]); Class 3 obesity in adult; Transient cerebral ischemia; Lumbar spondylosis; Chronic pain syndrome; and Chronic upper back pain on their problem list. [name] [name] was last seen on Visit date not found. Her primarily concern today is the Back Pain (mid thoracic to lower lumbar, buttocks and legs)  Pain Assessment: Location: Mid, Lower Back Radiating: down to lumbar and buttocks and down both legs  Onset: Yesterday Duration: Acute pain Quality: Constant, Discomfort(excruciating) Severity: 10-Worst pain ever/10 (self-reported pain score)  Note: Reported level is compatible with observation. Clinically the patient looks like a 3/10 A 3/10 is viewed as "Moderate" and described as significantly interfering with activities of daily living (ADL). It becomes difficult to feed, bathe, get dressed, get on and off the toilet or to perform personal hygiene functions. Difficult to get in and out of bed or a chair without assistance. Very distracting. With effort, it can be ignored when deeply involved in activities. Exaggerated score may be due to the reporting of a "suffering" component. When using our objective Pain Scale, levels between 6 and 10/10 are said to belong in an emergency room, as it progressively worsens from a 6/10, described as severely limiting, requiring emergency care not usually available at an outpatient pain management facility. At a 6/10 level, communication becomes difficult and requires great effort. Assistance to reach the emergency department may be required. Facial flushing and profuse sweating along with potentially dangerous increases in heart rate and blood pressure will be evident. Effect on ADL: unable to to function on her own  Timing:  Constant Modifying factors: nothing has helped, has tried muscle relaxer, heat and ice. tried ibuprofen and tylenol  Further details on both, my assessment(s), as well as the proposed treatment plan, please see below.  She was getting dressed on yesterday and heard something  pop in her back. She admits that she sat there for a moment and then she got up. She later states that pain got worse she sat on her couch and was no longer able to move. She called EMS because she was home alone and they transported her to the hospital. She admits that she was seen at [facility] on yesterday. She admits that she did not any xrays performed. She is having pain in the middle of her back that goes into her buttocks. She admits that the right is greater than the left. She states she is unable to get dressed. She admits that sitting is not effective. She admits that lying down is not effective. She states that she had tired heat, ice and [employer] along with her muscle relaxer's. She has used a pillow under her legs to help with pressure not effective. She admits that she has not had this type pain in the past. She denies any flare ups like this.  She denies any renal problems in the past. She has not had a recent chemistry.   Laboratory Chemistry  Inflammation Markers (CRP: Acute Phase) (ESR: Chronic Phase) Lab Results  Component Value Date   LATICACIDVEN 0.54 09/05/2014                         Rheumatology Markers No results found for: Elayne Guerin, [facility]              Renal Function Markers Lab Results  Component Value Date   BUN 19 04/07/2017   CREATININE 0.96 04/07/2017   GFRAA >60 04/07/2017   GFRNONAA >60 04/07/2017                 Hepatic Function Markers Lab Results  Component Value Date   AST 14 (L) 04/07/2017   ALT 15 04/07/2017   ALBUMIN 3.9 04/07/2017   ALKPHOS 106 04/07/2017   AMYLASE 54 11/18/2012   LIPASE 11 11/23/2016                 Electrolytes Lab  Results  Component Value Date   NA 141 04/07/2017   K 3.8 04/07/2017   CL 115 (H) 04/07/2017   CALCIUM 9.0 04/07/2017   MG 1.8 09/26/2014                        Neuropathy Markers Lab Results  Component Value Date   VITAMINB12 491 09/10/2016   FOLATE >20.0 09/10/2016   HGBA1C 5.5 10/09/2016   HIV Non Reactive 10/09/2016                 Bone Pathology Markers Lab Results  Component Value Date   VD25OH 23.5 (L) 10/09/2016   VD125OH2TOT 47 03/09/2015   [id number] 24 03/09/2015   [id number] 23 03/09/2015                         Coagulation Parameters Lab Results  Component Value Date   INR 1.04 02/05/2016   LABPROT 13.8 02/05/2016   APTT 35 02/05/2016   PLT 187 04/07/2017   DDIMER  05/29/2007    0.34        AT THE INHOUSE ESTABLISHED CUTOFF VALUE OF 0.48 ug/mL FEU, THIS ASSAY HAS BEEN DOCUMENTED IN THE LITERATURE TO HAVE                 Cardiovascular Markers Lab Results  Component Value Date   BNP 30.9 10/01/2014   CKTOTAL 101 05/29/2007   CKMB 1.2 05/29/2007   TROPONINI <0.03 04/07/2017   HGB 13.4 04/07/2017   HCT 39.8 04/07/2017                 CA Markers No results found for: CEA, CA125, LABCA2               Note: Lab results reviewed.  Recent Diagnostic Imaging Review  Cervical Imaging: Cervical MR wo contrast:  Results for orders placed during the hospital encounter of 08/28/17  MR CERVICAL SPINE WO CONTRAST   Narrative CLINICAL DATA:  Initial evaluation for chronic neck and lower back pain, bilateral numbness in arms and legs.  EXAM: MRI CERVICAL AND LUMBAR SPINE WITHOUT CONTRAST  TECHNIQUE: Multiplanar and multiecho pulse sequences of the cervical spine, to include the craniocervical junction and cervicothoracic junction, and lumbar spine, were obtained without intravenous contrast.  COMPARISON:  Previous MRI from 04/23/2015.  FINDINGS: MRI CERVICAL SPINE FINDINGS  Alignment: Study mildly degraded by motion artifact. Mild straightening  of the normal cervical lordosis without listhesis or subluxation.  Vertebrae: Vertebral body heights well maintained without evidence for acute or chronic fracture. Bone marrow signal intensity within normal limits. No discrete or worrisome osseous lesions. No abnormal marrow edema.  Cord: Signal intensity within the cervical spinal cord is normal.  Posterior Fossa, vertebral arteries, paraspinal tissues: Visualized brain and posterior fossa within normal limits. Mild flattening of the pons at the prepontine cistern is stable from previous exam. Craniocervical junction normal. Paraspinous and prevertebral soft tissues are normal. Normal intravascular flow voids present within the vertebral arteries bilaterally. Subcentimeter T2 cystic lesion just to the left of midline at the level of the supraglottic hypopharynx likely a small retention cysts, of doubtful significance.  Disc levels:  No significant disc pathology seen within the cervical spine. No disc bulge or disc protrusion. No significant facet disease. No canal or neural foraminal stenosis. No impingement.  MRI LUMBAR SPINE FINDINGS  Segmentation: Transitional lumbosacral anatomy with partial sacralization of the L5 vertebral body. Lowest well-formed disc labeled the L5-S1 level.  Alignment: Vertebral bodies normally aligned with preservation of the normal lumbar lordosis. No listhesis or subluxation.  Vertebrae: Vertebral body heights well maintained without evidence for acute or chronic fracture bone marrow signal intensity within normal limits. No discrete or worrisome osseous lesions. No abnormal marrow edema.  Conus medullaris and cauda equina: Conus extends to the T12 all 1 level. Conus and cauda equina appear normal.  Paraspinal and other soft tissues: Paraspinous soft tissues within normal limits. Few scattered T2 hyperintense simple cyst noted within the kidneys, left greater than right. Visualized  visceral structures otherwise unremarkable.  Disc levels:  L1-2:  Unremarkable.  L2-3:  Unremarkable.  L3-4:  Unremarkable.  L4-5: Disc desiccation without significant disc bulge. Mild bilateral facet hypertrophy. No significant canal or neural foraminal stenosis. No impingement.  L5-S1: Transitional lumbosacral anatomy. Normal interspace without disc bulge or disc protrusion. Mild right-sided facet hypertrophy. No canal or foraminal stenosis.  IMPRESSION: MRI CERVICAL SPINE IMPRESSION  Stable normal MRI of the cervical spine.  MRI LUMBAR SPINE IMPRESSION  1. Mild disc desiccation with facet hypertrophy at L4-5 without stenosis or impingement. 2. Mild right-sided facet hypertrophy at L5-S1 without stenosis. 3. Transitional lumbosacral anatomy. 4. Otherwise unremarkable MRI of the lumbar spine. No significant stenosis. No evidence for neural impingement.   Electronically Signed   By: Pincus Badder.D.  On: 08/28/2017 20:45     Cervical MR w/wo contrast:  Results for orders placed during the hospital encounter of 04/22/15  MR Cervical Spine W Wo Contrast   Narrative CLINICAL DATA:  Right-sided numbness and weakness.  Headache.  EXAM: MRI CERVICAL SPINE WITHOUT AND WITH CONTRAST  TECHNIQUE: Multiplanar and multiecho pulse sequences of the cervical spine, to include the craniocervical junction and cervicothoracic junction, were obtained according to standard protocol without and with intravenous contrast.  CONTRAST:  20 mL MultiHance IV  COMPARISON:  None.  FINDINGS: Normal alignment. Negative for fracture or mass. Spinal cord signal is normal. Normal enhancement following contrast infusion.  No significant degenerative change in the cervical spine. Negative for disc protrusion or spinal stenosis.  Small central disc protrusions at T2-3 and T3-4 without significant spinal or foraminal encroachment.  IMPRESSION: Negative MRI of the cervical  spine with contrast  Small central disc protrusions at T2-3 and T3-4.   Electronically Signed   By: Franchot Gallo M.D.   On: 04/23/2015 12:57   Lumbosacral Imaging: Lumbar MR wo contrast:  Results for orders placed during the hospital encounter of 08/28/17  MR LUMBAR SPINE WO CONTRAST   Narrative CLINICAL DATA:  Initial evaluation for chronic neck and lower back pain, bilateral numbness in arms and legs.  EXAM: MRI CERVICAL AND LUMBAR SPINE WITHOUT CONTRAST  TECHNIQUE: Multiplanar and multiecho pulse sequences of the cervical spine, to include the craniocervical junction and cervicothoracic junction, and lumbar spine, were obtained without intravenous contrast.  COMPARISON:  Previous MRI from 04/23/2015.  FINDINGS: MRI CERVICAL SPINE FINDINGS  Alignment: Study mildly degraded by motion artifact. Mild straightening of the normal cervical lordosis without listhesis or subluxation.  Vertebrae: Vertebral body heights well maintained without evidence for acute or chronic fracture. Bone marrow signal intensity within normal limits. No discrete or worrisome osseous lesions. No abnormal marrow edema.  Cord: Signal intensity within the cervical spinal cord is normal.  Posterior Fossa, vertebral arteries, paraspinal tissues: Visualized brain and posterior fossa within normal limits. Mild flattening of the pons at the prepontine cistern is stable from previous exam. Craniocervical junction normal. Paraspinous and prevertebral soft tissues are normal. Normal intravascular flow voids present within the vertebral arteries bilaterally. Subcentimeter T2 cystic lesion just to the left of midline at the level of the supraglottic hypopharynx likely a small retention cysts, of doubtful significance.  Disc levels:  No significant disc pathology seen within the cervical spine. No disc bulge or disc protrusion. No significant facet disease. No canal or neural foraminal stenosis. No  impingement.  MRI LUMBAR SPINE FINDINGS  Segmentation: Transitional lumbosacral anatomy with partial sacralization of the L5 vertebral body. Lowest well-formed disc labeled the L5-S1 level.  Alignment: Vertebral bodies normally aligned with preservation of the normal lumbar lordosis. No listhesis or subluxation.  Vertebrae: Vertebral body heights well maintained without evidence for acute or chronic fracture bone marrow signal intensity within normal limits. No discrete or worrisome osseous lesions. No abnormal marrow edema.  Conus medullaris and cauda equina: Conus extends to the T12 all 1 level. Conus and cauda equina appear normal.  Paraspinal and other soft tissues: Paraspinous soft tissues within normal limits. Few scattered T2 hyperintense simple cyst noted within the kidneys, left greater than right. Visualized visceral structures otherwise unremarkable.  Disc levels:  L1-2:  Unremarkable.  L2-3:  Unremarkable.  L3-4:  Unremarkable.  L4-5: Disc desiccation without significant disc bulge. Mild bilateral facet hypertrophy. No significant canal or neural foraminal stenosis. No impingement.  L5-S1: Transitional lumbosacral anatomy. Normal interspace without disc bulge or disc protrusion. Mild right-sided facet hypertrophy. No canal or foraminal stenosis.  IMPRESSION: MRI CERVICAL SPINE IMPRESSION  Stable normal MRI of the cervical spine.  MRI LUMBAR SPINE IMPRESSION  1. Mild disc desiccation with facet hypertrophy at L4-5 without stenosis or impingement. 2. Mild right-sided facet hypertrophy at L5-S1 without stenosis. 3. Transitional lumbosacral anatomy. 4. Otherwise unremarkable MRI of the lumbar spine. No significant stenosis. No evidence for neural impingement.   Electronically Signed   By: Jeannine Boga M.D.   On: 08/28/2017 20:45     Knee Imaging: . Knee-L DG 4 views:  Results for orders placed during the hospital encounter of 05/15/12   DG Knee Complete 4 Views Left   Narrative *RADIOLOGY REPORT*  Clinical Data: Fall, left knee pain/injury  LEFT KNEE - COMPLETE 4+ VIEW  Comparison: None.  Findings: No fracture or dislocation is seen.  The joint spaces are preserved.  The visualized soft tissues are unremarkable.  No definite suprapatellar knee joint effusion.  IMPRESSION: No fracture or dislocation is seen.   Original Report Authenticated By: Julian Hy, M.D.    Ankle Imaging: Ankle-R DG Complete:  Results for orders placed during the hospital encounter of 11/17/09  DG Ankle Complete Right   Narrative Clinical Data: Ankle injury.   RIGHT ANKLE - COMPLETE 3+ VIEW   Comparison: None.   Findings: There is lateral soft tissue swelling.  The alignment is normal.  There is no evidence of acute fracture or dislocation.   IMPRESSION: No acute osseous findings.  Provider: Cipriano Bunker    Foot Imaging: Foot-R DG Complete:  Results for orders placed during the hospital encounter of 10/20/10  DG Foot Complete Right   Narrative *RADIOLOGY REPORT*  Clinical Data: Dropped heavy object on foot.  Foot pain and decreased range of motion.  RIGHT FOOT COMPLETE - 3+ VIEW  Comparison:  None.  Findings:  There is no evidence of fracture or dislocation.  There is no evidence of arthropathy or other focal bone abnormality. Soft tissues are unremarkable.  IMPRESSION: Negative.  Original Report Authenticated By: Marlaine Hind, M.D.    Complexity Note: Imaging results reviewed. Results shared with [name] [name], using Layman's terms.                         Meds   Current Outpatient Medications:  .  albuterol (PROVENTIL HFA;VENTOLIN HFA) 108 (90 BASE) MCG/ACT inhaler, Inhale 2 puffs into the lungs every 6 (six) hours as needed for wheezing or shortness of breath. , Disp: , Rfl:  .  albuterol (PROVENTIL) (2.5 MG/3ML) 0.083% nebulizer solution, Take 2.5 mg by nebulization every 6 (six) hours as needed  for wheezing or shortness of breath., Disp: , Rfl:  .  clonazePAM (KLONOPIN) 1 MG tablet, Take 1 tablet (1 mg total) as needed by mouth (at onset of a seizure)., Disp: 90 tablet, Rfl: 0 .  dicyclomine (BENTYL) 20 MG tablet, Take 1 tablet by mouth every 6 (six) hours as needed., Disp: , Rfl:  .  estradiol (ESTRACE) 1 MG tablet, TAKE 1 TABLET BY MOUTH EVERY DAY, Disp: 90 tablet, Rfl: 2 .  FLUoxetine (PROZAC) 20 MG capsule, Take 1 capsule (20 mg total) by mouth daily., Disp: 90 capsule, Rfl: 1 .  Fremanezumab-vfrm (AJOVY) 225 MG/1.5ML SOSY, Inject 225 mg every 30 (thirty) days into the skin., Disp: 1 Syringe, Rfl: 11 .  gabapentin (NEURONTIN) 300 MG capsule, Take  1 capsule (300 mg total) at bedtime by mouth., Disp: 90 capsule, Rfl: 3 .  lamoTRIgine (LAMICTAL) 150 MG tablet, Take 1 tablet (150 mg total) 2 (two) times daily by mouth., Disp: 180 tablet, Rfl: 3 .  metoprolol tartrate (LOPRESSOR) 25 MG tablet, Take 1 tablet (25 mg total) by mouth 2 (two) times daily. (Patient taking differently: Take 25 mg by mouth daily. ), Disp: 60 tablet, Rfl: 0 .  omeprazole (PRILOSEC) 40 MG capsule, Take 40 mg by mouth daily., Disp: , Rfl:  .  ondansetron (ZOFRAN) 4 MG tablet, Take 1 tablet (4 mg total) by mouth every 8 (eight) hours as needed for nausea or vomiting., Disp: 10 tablet, Rfl: 0 .  ranitidine (ZANTAC) 300 MG tablet, Take 300 mg by mouth daily as needed for heartburn. , Disp: , Rfl:  .  SUMAtriptan (IMITREX) 50 MG tablet, Take 1 tablet (50 mg total) every 2 (two) hours as needed by mouth for migraine. May repeat in 2 hours if headache persists or recurs., Disp: 30 tablet, Rfl: 1 .  tiZANidine (ZANAFLEX) 4 MG capsule, Take 1 capsule (4 mg total) by mouth 3 (three) times daily as needed for muscle spasms., Disp: 90 capsule, Rfl: 0 .  topiramate (TOPAMAX) 100 MG tablet, Take 1 tablet (100 mg total) 2 (two) times daily by mouth., Disp: 180 tablet, Rfl: 3 .  traZODone (DESYREL) 50 MG tablet, Take 1 tablet (50 mg  total) by mouth at bedtime as needed for sleep., Disp: 90 tablet, Rfl: 1 .  Vitamin D, Ergocalciferol, (DRISDOL) 50000 units CAPS capsule, Take 1 capsule (50,000 Units total) by mouth every Tuesday., Disp: 30 capsule, Rfl: 0  Current Facility-Administered Medications:  .  Fremanezumab-vfrm SOSY 1.5 mL, 1.5 mL, Subcutaneous, Q30 days, Dohmeier, Asencion Partridge, MD .  ketorolac (TORADOL) injection 60 mg, 60 mg, Intramuscular, Once, King, [employer], NP .  orphenadrine (NORFLEX) injection 60 mg, 60 mg, Intramuscular, Once, King, [employer], NP  ROS  Constitutional: Denies any fever or chills Gastrointestinal: No reported hemesis, hematochezia, vomiting, or acute GI distress Musculoskeletal: Denies any acute onset joint swelling, redness, loss of ROM, or weakness Neurological: No reported episodes of acute onset apraxia, aphasia, dysarthria, agnosia, amnesia, paralysis, loss of coordination, or loss of consciousness  Allergies  [name] [name] is allergic to amoxicillin; penicillins; biaxin [clarithromycin]; erythromycin base; and zithromax [azithromycin].  PFSH  Drug: [name] [name]  reports that she does not use drugs. Alcohol:  reports that she does not drink alcohol. Tobacco:  reports that  has never smoked. she has never used smokeless tobacco. Medical:  has a past medical history of Anginal pain ([place]), Anxiety, Asthma, Atrial fibrillation ([place]), Chest pain, Chronic back pain, Chronic bronchitis ([place]), Complication of anesthesia, Daily headache, Epilepsy ([place]), Fibroid, GERD (gastroesophageal reflux disease), Head injury (1975; 2015), Joint pain, Migraine, Obesity, Class III, BMI 40-49.9 (morbid obesity) ([place]) (12/21/2012), Ovarian cyst, Pre-diabetes, Seizures ([place]), Shortness of breath dyspnea, TBI (traumatic brain injury) ([place]) (2015), TIA (transient ischemic attack) (04/2015?), and Vitamin D deficiency. Surgical: [name] [name]  has a past surgical history that includes Cesarean section (1989);  Esophagogastroduodenoscopy (N/A, 11/21/2012); Colonoscopy (N/A, 11/21/2012); Incisional hernia repair (N/A, 12/17/2012); Insertion of mesh (N/A, 12/17/2012); Cardiac catheterization (N/A, 01/21/2015); Appendectomy (1990s); Hernia repair; Dilation and curettage of uterus (1985); Ovarian cyst removal (2007; 2014); Abdominal hysterectomy (2005); Colonoscopy with propofol (N/A, 09/02/2017); and Esophagogastroduodenoscopy (egd) with propofol (N/A, 09/02/2017). Family: family history includes Breast cancer in her cousin; COPD in her father; Depression in her mother; Diabetes in  her father; Emphysema in her father; Heart attack in her father; Heart disease in her father and mother; Hypertension in her father and mother.  Constitutional Exam  General appearance: Well nourished, well developed, and well hydrated. In no apparent acute distress Vitals:   11/13/17 1138  BP: (!) 99/54  Pulse: (!) 55  Resp: 16  Temp: 98.4 F (36.9 C)  TempSrc: Oral  SpO2: 98%  Weight: 273 lb (123.8 kg)  Height: 5' 2.5" (1.588 m)   BMI Assessment: Estimated body mass index is 49.14 kg/m as calculated from the following:   Height as of this encounter: 5' 2.5" (1.588 m).   Weight as of this encounter: 273 lb (123.8 kg). Psych/Mental status: Alert, oriented x 3 (person, place, & time)       Eyes: PERLA Respiratory: No evidence of acute respiratory distress  Cervical Spine Area Exam  Skin & Axial Inspection: No masses, redness, edema, swelling, or associated skin lesions Alignment: Symmetrical Functional ROM: Unrestricted ROM      Stability: No instability detected Muscle Tone/Strength: Functionally intact. No obvious neuro-muscular anomalies detected. Sensory (Neurological): Unimpaired Palpation: No palpable anomalies              Upper Extremity (UE) Exam    Side: Right upper extremity  Side: Left upper extremity  Skin & Extremity Inspection: Skin color, temperature, and hair growth are WNL. No peripheral edema or  cyanosis. No masses, redness, swelling, asymmetry, or associated skin lesions. No contractures.  Skin & Extremity Inspection: Skin color, temperature, and hair growth are WNL. No peripheral edema or cyanosis. No masses, redness, swelling, asymmetry, or associated skin lesions. No contractures.  Functional ROM: Unrestricted ROM          Functional ROM: Unrestricted ROM          Muscle Tone/Strength: Functionally intact. No obvious neuro-muscular anomalies detected.  Muscle Tone/Strength: Functionally intact. No obvious neuro-muscular anomalies detected.  Sensory (Neurological): Unimpaired          Sensory (Neurological): Unimpaired          Palpation: No palpable anomalies              Palpation: No palpable anomalies              Specialized Test(s): Deferred         Specialized Test(s): Deferred          Thoracic Spine Area Exam  Skin & Axial Inspection: No masses, redness, or swelling Alignment: Symmetrical Functional ROM: Unrestricted ROM Stability: No instability detected Muscle Tone/Strength: Functionally intact. No obvious neuro-muscular anomalies detected. Sensory (Neurological): Unimpaired Muscle strength & Tone: Complains of area being tender to palpation  Lumbar Spine Area Exam  Skin & Axial Inspection: No masses, redness, or swelling Alignment: Symmetrical Functional ROM: Painful restricted ROM      Stability: No instability detected Muscle Tone/Strength: Functionally intact. No obvious neuro-muscular anomalies detected. Sensory (Neurological): Unimpaired Palpation: Complains of area being tender to palpation       Provocative Tests: Lumbar Hyperextension and rotation test: evaluation deferred today      chair leg raises postive bilaterally less that 90 degrees Lumbar Lateral bending test: evaluation deferred today       Patrick's Maneuver: evaluation deferred today                    Gait & Posture Assessment  Ambulation: Patient came in today in a wheel chair Gait:  Antalgic Posture: WNL   Lower Extremity Exam  Side: Right lower extremity  Side: Left lower extremity  Skin & Extremity Inspection: Skin color, temperature, and hair growth are WNL. No peripheral edema or cyanosis. No masses, redness, swelling, asymmetry, or associated skin lesions. No contractures.  Skin & Extremity Inspection: Skin color, temperature, and hair growth are WNL. No peripheral edema or cyanosis. No masses, redness, swelling, asymmetry, or associated skin lesions. No contractures.  Functional ROM: Unrestricted ROM          Functional ROM: Unrestricted ROM          Muscle Tone/Strength: Functionally intact. No obvious neuro-muscular anomalies detected.  Muscle Tone/Strength: Functionally intact. No obvious neuro-muscular anomalies detected.  Sensory (Neurological): Unimpaired  Sensory (Neurological): Unimpaired  Palpation: No palpable anomalies  Palpation: No palpable anomalies   Assessment  Primary Diagnosis & Pertinent Problem List: Diagnoses of Lumbar spondylosis, Chronic upper back pain, Lumbosacral radiculopathy, and Chronic pain syndrome were pertinent to this visit.  Status Diagnosis  Having a Flare-up Having a Flare-up Having a Flare-up 1. Lumbar spondylosis   2. Chronic upper back pain   3. Lumbosacral radiculopathy   4. Chronic pain syndrome     Problems updated and reviewed during this visit: Problem  Lumbar Spondylosis  Chronic Pain Syndrome  Chronic Upper Back Pain  Anxiety  History of Cva (Cerebrovascular Accident) Without Residual Deficits  Convulsions (Hcc)  Class 3 Obesity in Adult  Headache  Other Symptoms and Signs Involving The Musculoskeletal System  Migraine With Aura  Transient Cerebral Ischemia  Seizure disorder (HCC)  Asthma  Cerebral Artery Occlusion With Cerebral Infarction (Hcc)  Generalized Muscle Weakness  Skin Sensation Disturbance  Migraines   Plan of Care  Pharmacotherapy (Medications Ordered): Meds ordered this encounter   Medications  . ketorolac (TORADOL) injection 60 mg  . orphenadrine (NORFLEX) injection 60 mg   New Prescriptions   No medications on file   Medications administered today: [name] [name] had no medications administered during this visit. Lab-work, procedure(s), and/or referral(s): Orders Placed This Encounter  Procedures  . DG Lumbar Spine Complete W/Bend  . DG Thoracic Spine 2 View  . Comp. Metabolic Panel (12)   Imaging and/or referral(s): None  Interventional therapies: Planned, scheduled, and/or pending:   As scheduled  bilateral lumbar facets, L3-L5 with sedation with steroid  Provider-requested follow-up: Return for Appointment As Scheduled.  Future Appointments  Date Time Provider [place]  11/13/2017 12:55 PM [facility]  11/18/2017 12:00 PM [name], MD [facility] None  12/30/2017 11:00 AM Dohmeier, Asencion Partridge, MD [facility] None   Primary Care Physician: [name], MD Location: [facility] Note by: Vevelyn Francois NP Date: 11/13/2017; Time: 12:53 PM  Pain Score Disclaimer: We use the NRS-11 scale. This is a self-reported, subjective measurement of pain severity with only modest accuracy. It is used primarily to identify changes within a particular patient. It must be understood that outpatient pain scales are significantly less accurate that those used for research, where they can be applied under ideal controlled circumstances with minimal exposure to variables. In reality, the score is likely to be a combination of pain intensity and pain affect, where pain affect describes the degree of emotional arousal or changes in action readiness caused by the sensory experience of pain. Factors such as social and work situation, setting, emotional state, anxiety levels, expectation, and prior pain experience may influence pain perception and show large inter-individual differences that may also be affected by time  variables.  Patient instructions provided  during this appointment: Patient Instructions

## 2017-11-13 NOTE — Telephone Encounter (Signed)
Patient called re; a flare up in her back.  States she was getting dressed and felt something pop.  Walked to the living room and then the back locked up and continues to be.  States she is having to have help getting up and walking.  Has taken Zanaflex but states that is not helping. Went to the ED and they told her she would need to call us.  Has tried ice and heat and gets no relief.  Patient states she has an appt for facet blocks.

## 2017-11-13 NOTE — Telephone Encounter (Signed)
Patient to see Crystal at 11:30 for evaluation of back and possible Toradol/ norflex injection.

## 2017-11-13 NOTE — Progress Notes (Signed)
Safety precautions to be maintained throughout the outpatient stay will include: orient to surroundings, keep bed in low position, maintain call bell within reach at all times, provide assistance with transfer out of bed and ambulation.  

## 2017-11-14 ENCOUNTER — Telehealth: Payer: Self-pay | Admitting: *Deleted

## 2017-11-14 LAB — COMP. METABOLIC PANEL (12)
ALBUMIN: 3.9 g/dL (ref 3.5–5.5)
ALK PHOS: 111 IU/L (ref 39–117)
AST: 16 IU/L (ref 0–40)
Albumin/Globulin Ratio: 1.3 (ref 1.2–2.2)
BUN/Creatinine Ratio: 14 (ref 9–23)
BUN: 12 mg/dL (ref 6–24)
Bilirubin Total: 0.3 mg/dL (ref 0.0–1.2)
Calcium: 9 mg/dL (ref 8.7–10.2)
Chloride: 106 mmol/L (ref 96–106)
Creatinine, Ser: 0.86 mg/dL (ref 0.57–1.00)
GFR calc Af Amer: 91 mL/min/{1.73_m2} (ref >59)
GFR, EST NON AFRICAN AMERICAN: 79 mL/min/{1.73_m2} (ref >59)
GLUCOSE: 107 mg/dL — AB (ref 65–99)
Globulin, Total: 3 g/dL (ref 1.5–4.5)
Potassium: 4.1 mmol/L (ref 3.5–5.2)
SODIUM: 139 mmol/L (ref 134–144)
TOTAL PROTEIN: 6.9 g/dL (ref 6.0–8.5)

## 2017-11-14 NOTE — Progress Notes (Signed)
Results were reviewed and found to be: mildly abnormal  No acute injury or pathology identified  Review would suggest interventional pain management techniques may be of benefit 

## 2017-11-14 NOTE — Telephone Encounter (Signed)
Patient states feeling better after injection yesterday. Informed of lab and x-ray results. Advised to keep procedure appointment.

## 2017-11-14 NOTE — Telephone Encounter (Signed)
-----   Message from Vevelyn Francois, NP sent at 11/14/2017 10:31 AM EDT ----- Please follow up and see how she is doing today with her pain.  She was given with Toradol and Norflex injection on yesterday and compeleted images and had labs secondary to a flare-up of her middle and low back pain. Please make the patient aware that her images are unchanged; there was no acute abnormalities. Also make her aware that her chemistry was normal except for slightly elevated blood glucose 107 Encourage patient to follow-up on Monday with her procedure appointment as scheduled Thank you

## 2017-11-18 ENCOUNTER — Ambulatory Visit
Admission: RE | Admit: 2017-11-18 | Discharge: 2017-11-18 | Disposition: A | Discharge status: Home / Self Care | Payer: BC Managed Care – PPO | Source: Ambulatory Visit | Attending: Student in an Organized Health Care Education/Training Program | Admitting: Student in an Organized Health Care Education/Training Program

## 2017-11-18 ENCOUNTER — Other Ambulatory Visit: Payer: Self-pay

## 2017-11-18 ENCOUNTER — Ambulatory Visit (HOSPITAL_BASED_OUTPATIENT_CLINIC_OR_DEPARTMENT_OTHER): Payer: BC Managed Care – PPO | Admitting: Student in an Organized Health Care Education/Training Program

## 2017-11-18 ENCOUNTER — Encounter: Payer: Self-pay | Admitting: Student in an Organized Health Care Education/Training Program

## 2017-11-18 ENCOUNTER — Ambulatory Visit: Payer: Self-pay | Admitting: Student in an Organized Health Care Education/Training Program

## 2017-11-18 VITALS — BP 133/77 | HR 49 | Temp 98.2°F | Resp 16 | Ht 62.5 in | Wt 275.0 lb

## 2017-11-18 DIAGNOSIS — Z888 Allergy status to other drugs, medicaments and biological substances status: Secondary | ICD-10-CM | POA: Diagnosis not present

## 2017-11-18 DIAGNOSIS — Z9889 Other specified postprocedural states: Secondary | ICD-10-CM | POA: Diagnosis not present

## 2017-11-18 DIAGNOSIS — G894 Chronic pain syndrome: Secondary | ICD-10-CM

## 2017-11-18 DIAGNOSIS — M5137 Other intervertebral disc degeneration, lumbosacral region: Secondary | ICD-10-CM | POA: Diagnosis not present

## 2017-11-18 DIAGNOSIS — Z880 Allergy status to penicillin: Secondary | ICD-10-CM | POA: Diagnosis not present

## 2017-11-18 DIAGNOSIS — Z881 Allergy status to other antibiotic agents status: Secondary | ICD-10-CM | POA: Diagnosis not present

## 2017-11-18 DIAGNOSIS — Z90710 Acquired absence of both cervix and uterus: Secondary | ICD-10-CM | POA: Insufficient documentation

## 2017-11-18 DIAGNOSIS — Z79899 Other long term (current) drug therapy: Secondary | ICD-10-CM | POA: Insufficient documentation

## 2017-11-18 DIAGNOSIS — M47816 Spondylosis without myelopathy or radiculopathy, lumbar region: Secondary | ICD-10-CM | POA: Diagnosis not present

## 2017-11-18 DIAGNOSIS — M51379 Other intervertebral disc degeneration, lumbosacral region without mention of lumbar back pain or lower extremity pain: Secondary | ICD-10-CM

## 2017-11-18 MED ORDER — LIDOCAINE HCL (PF) 1 % IJ SOLN
INTRAMUSCULAR | Status: AC
  Filled 2017-11-18: qty 5

## 2017-11-18 MED ORDER — LIDOCAINE HCL 1 % IJ SOLN
10.0000 mL | Freq: Once | INTRAMUSCULAR | Status: AC
  Administered 2017-11-18: 10 mL
  Filled 2017-11-18: qty 10

## 2017-11-18 MED ORDER — FENTANYL CITRATE (PF) 100 MCG/2ML IJ SOLN
25.0000 ug | INTRAMUSCULAR | Status: DC | PRN
  Administered 2017-11-18: 100 ug via INTRAVENOUS
  Filled 2017-11-18: qty 2

## 2017-11-18 MED ORDER — ROPIVACAINE HCL 2 MG/ML IJ SOLN
10.0000 mL | Freq: Once | INTRAMUSCULAR | Status: AC
Start: 2017-11-18 — End: 2017-11-18
  Administered 2017-11-18: 10 mL
  Filled 2017-11-18: qty 10

## 2017-11-18 MED ORDER — DEXAMETHASONE SODIUM PHOSPHATE 10 MG/ML IJ SOLN
10.0000 mg | Freq: Once | INTRAMUSCULAR | Status: AC
Start: 2017-11-18 — End: 2017-11-18
  Administered 2017-11-18: 10 mg
  Filled 2017-11-18: qty 1

## 2017-11-18 MED ORDER — LACTATED RINGERS IV SOLN
1000.0000 mL | Freq: Once | INTRAVENOUS | Status: AC
  Administered 2017-11-18: 1000 mL via INTRAVENOUS

## 2017-11-18 NOTE — Progress Notes (Signed)
Safety precautions to be maintained throughout the outpatient stay will include: orient to surroundings, keep bed in low position, maintain call bell within reach at all times, provide assistance with transfer out of bed and ambulation.  

## 2017-11-18 NOTE — Patient Instructions (Signed)

## 2017-11-18 NOTE — Progress Notes (Signed)
Patient's Name: [name] [name]  MRN: [id number]  Referring Provider: [name], MD  DOB: [birth year]  PCP: [name], MD  DOS: 11/18/2017  Note by: [name], MD  Service setting: Ambulatory outpatient  Specialty: Interventional Pain Management  Patient type: Established  Location: [facility] (AMB) Pain Management Facility  Visit type: Interventional Procedure   Primary Reason for Visit: Interventional Pain Management Treatment. CC: Back Pain (lower, mid)  Procedure:       Anesthesia, Analgesia, Anxiolysis:  Type: Lumbar Facet, Medial Branch Block(s) #1  Primary Purpose: Diagnostic Region: Posterolateral Lumbosacral Spine Level: L3, L4, L5,  Medial Branch Level(s). Injecting these levels blocks the  L4-5, and L5-S1 lumbar facet joints. Laterality: Bilateral  Type: Moderate (Conscious) Sedation combined with Local Anesthesia Indication(s): Analgesia and Anxiety Route: Intravenous (IV) IV Access: Secured Sedation: Meaningful verbal contact was maintained at all times during the procedure  Local Anesthetic: Lidocaine 1-2%   Indications: 1. Lumbar spondylosis   2. Lumbar facet arthropathy   3. DDD (degenerative disc disease), lumbosacral   4. Chronic pain syndrome    Pain Score: Pre-procedure: 9 /10 Post-procedure: 0-No pain/10  Pre-op Assessment:  [name] [name] is a 52 y.o. (year old), female patient, seen today for interventional treatment. She  has a past surgical history that includes Cesarean section (1989); Esophagogastroduodenoscopy (N/A, 11/21/2012); Colonoscopy (N/A, 11/21/2012); Incisional hernia repair (N/A, 12/17/2012); Insertion of mesh (N/A, 12/17/2012); Cardiac catheterization (N/A, 01/21/2015); Appendectomy (1990s); Hernia repair; Dilation and curettage of uterus (1985); Ovarian cyst removal (2007; 2014); Abdominal hysterectomy (2005); Colonoscopy with propofol (N/A, 09/02/2017); and Esophagogastroduodenoscopy (egd) with propofol (N/A, 09/02/2017). [name] [name] has a current  medication list which includes the following prescription(s): albuterol, albuterol, clonazepam, dicyclomine, estradiol, fluoxetine, fremanezumab-vfrm, gabapentin, lamotrigine, metoprolol tartrate, omeprazole, ondansetron, ranitidine, sumatriptan, tizanidine, topiramate, trazodone, and vitamin d (ergocalciferol), and the following Facility-Administered Medications: fentanyl, fremanezumab-vfrm, and lactated ringers. Her primarily concern today is the Back Pain (lower, mid)  Initial Vital Signs:  Pulse Rate: (!) 55 Temp: 98.2 F (36.8 C) Resp: 16 BP: 130/73 SpO2: 99 %  BMI: Estimated body mass index is 49.5 kg/m as calculated from the following:   Height as of this encounter: 5' 2.5" (1.588 m).   Weight as of this encounter: 275 lb (124.7 kg).  Risk Assessment: Allergies: Reviewed. She is allergic to amoxicillin; penicillins; biaxin [clarithromycin]; erythromycin base; and zithromax [azithromycin].  Allergy Precautions: None required Coagulopathies: Reviewed. None identified.  Blood-thinner therapy: None at this time Active Infection(s): Reviewed. None identified. [name] [name] is afebrile  Site Confirmation: [name] [name] was asked to confirm the procedure and laterality before marking the site Procedure checklist: Completed Consent: Before the procedure and under the influence of no sedative(s), amnesic(s), or anxiolytics, the patient was informed of the treatment options, risks and possible complications. To fulfill our ethical and legal obligations, as recommended by the American Medical Association's Code of Ethics, I have informed the patient of my clinical impression; the nature and purpose of the treatment or procedure; the risks, benefits, and possible complications of the intervention; the alternatives, including doing nothing; the risk(s) and benefit(s) of the alternative treatment(s) or procedure(s); and the risk(s) and benefit(s) of doing nothing. The patient was provided information  about the general risks and possible complications associated with the procedure. These may include, but are not limited to: failure to achieve desired goals, infection, bleeding, organ or nerve damage, allergic reactions, paralysis, and death. In addition, the patient was informed of those risks and complications associated to Spine-related procedures, such  as failure to decrease pain; infection (i.e.: Meningitis, epidural or intraspinal abscess); bleeding (i.e.: epidural hematoma, subarachnoid hemorrhage, or any other type of intraspinal or peri-dural bleeding); organ or nerve damage (i.e.: Any type of peripheral nerve, nerve root, or spinal cord injury) with subsequent damage to sensory, motor, and/or autonomic systems, resulting in permanent pain, numbness, and/or weakness of one or several areas of the body; allergic reactions; (i.e.: anaphylactic reaction); and/or death. Furthermore, the patient was informed of those risks and complications associated with the medications. These include, but are not limited to: allergic reactions (i.e.: anaphylactic or anaphylactoid reaction(s)); adrenal axis suppression; blood sugar elevation that in diabetics may result in ketoacidosis or comma; water retention that in patients with history of congestive heart failure may result in shortness of breath, pulmonary edema, and decompensation with resultant heart failure; weight gain; swelling or edema; medication-induced neural toxicity; particulate matter embolism and blood vessel occlusion with resultant organ, and/or nervous system infarction; and/or aseptic necrosis of one or more joints. Finally, the patient was informed that Medicine is not an exact science; therefore, there is also the possibility of unforeseen or unpredictable risks and/or possible complications that may result in a catastrophic outcome. The patient indicated having understood very clearly. We have given the patient no guarantees and we have made no  promises. Enough time was given to the patient to ask questions, all of which were answered to the patient's satisfaction. [name] has indicated that she wanted to continue with the procedure. Attestation: I, the ordering provider, attest that I have discussed with the patient the benefits, risks, side-effects, alternatives, likelihood of achieving goals, and potential problems during recovery for the procedure that I have provided informed consent. Date  Time: 11/18/2017 11:48 AM  Pre-Procedure Preparation:  Monitoring: As per clinic protocol. Respiration, ETCO2, SpO2, BP, heart rate and rhythm monitor placed and checked for adequate function Safety Precautions: Patient was assessed for positional comfort and pressure points before starting the procedure. Time-out: I initiated and conducted the "Time-out" before starting the procedure, as per protocol. The patient was asked to participate by confirming the accuracy of the "Time Out" information. Verification of the correct person, site, and procedure were performed and confirmed by me, the nursing staff, and the patient. "Time-out" conducted as per Joint Commission's Universal Protocol (UP.01.01.01). Time: 1250  Description of Procedure:       Position: Prone Laterality: Bilateral. The procedure was performed in identical fashion on both sides. Levels:   L3, L4, L5,  Medial Branch Level(s) Area Prepped: Posterior Lumbosacral Region Prepping solution: ChloraPrep (2% chlorhexidine gluconate and 70% isopropyl alcohol) Safety Precautions: Aspiration looking for blood return was conducted prior to all injections. At no point did we inject any substances, as a needle was being advanced. Before injecting, the patient was told to immediately notify me if she was experiencing any new onset of "ringing in the ears, or metallic taste in the mouth". No attempts were made at seeking any paresthesias. Safe injection practices and needle disposal techniques  used. Medications properly checked for expiration dates. SDV (single dose vial) medications used. After the completion of the procedure, all disposable equipment used was discarded in the proper designated medical waste containers. Local Anesthesia: Protocol guidelines were followed. The patient was positioned over the fluoroscopy table. The area was prepped in the usual manner. The time-out was completed. The target area was identified using fluoroscopy. A 12-in long, straight, sterile hemostat was used with fluoroscopic guidance to locate the targets for each  level blocked. Once located, the skin was marked with an approved surgical skin marker. Once all sites were marked, the skin (epidermis, dermis, and hypodermis), as well as deeper tissues (fat, connective tissue and muscle) were infiltrated with a small amount of a short-acting local anesthetic, loaded on a 10cc syringe with a 25G, 1.5-in  Needle. An appropriate amount of time was allowed for local anesthetics to take effect before proceeding to the next step. Local Anesthetic: Lidocaine 1.0% The unused portion of the local anesthetic was discarded in the proper designated containers. Technical explanation of process:   L3 Medial Branch Nerve Block (MBB): The target area for the L3 medial branch is at the junction of the postero-lateral aspect of the superior articular process and the superior, posterior, and medial edge of the transverse process of L4. Under fluoroscopic guidance, a Quincke needle was inserted until contact was made with os over the superior postero-lateral aspect of the pedicular shadow (target area). After negative aspiration for blood, 1 mL of the nerve block solution was injected without difficulty or complication. The needle was removed intact. L4 Medial Branch Nerve Block (MBB): The target area for the L4 medial branch is at the junction of the postero-lateral aspect of the superior articular process and the superior, posterior,  and medial edge of the transverse process of L5. Under fluoroscopic guidance, a Quincke needle was inserted until contact was made with os over the superior postero-lateral aspect of the pedicular shadow (target area). After negative aspiration for blood, 110mL of the nerve block solution was injected without difficulty or complication. The needle was removed intact. L5 Medial Branch Nerve Block (MBB): The target area for the L5 medial branch is at the junction of the postero-lateral aspect of the superior articular process and the superior, posterior, and medial edge of the sacral ala. Under fluoroscopic guidance, a Quincke needle was inserted until contact was made with os over the superior postero-lateral aspect of the pedicular shadow (target area). After negative aspiration for blood,35mL of the nerve block solution was injected without difficulty or complication. The needle was removed intact.  Procedural Needles: 22-gauge, 3.5-inch, Quincke needles used for all levels. Nerve block solution: 10 cc solution made of 9 cc of 0.2% ropivacaine, 1 cc of Decadron (10 mg/cc). 1-1.5 cc injected at each level.  The unused portion of the solution was discarded in the proper designated containers.  Once the entire procedure was completed, the treated area was cleaned, making sure to leave some of the prepping solution back to take advantage of its long term bactericidal properties.   Illustration of the posterior view of the lumbar spine and the posterior neural structures. Laminae of L2 through S1 are labeled. DPRL5, dorsal primary ramus of L5; DPRS1, dorsal primary ramus of S1; DPR3, dorsal primary ramus of L3; FJ, facet (zygapophyseal) joint L3-L4; I, inferior articular process of L4; LB1, lateral branch of dorsal primary ramus of L1; IAB, inferior articular branches from L3 medial branch (supplies L4-L5 facet joint); IBP, intermediate branch plexus; MB3, medial branch of dorsal primary ramus of L3; NR3, third  lumbar nerve root; S, superior articular process of L5; SAB, superior articular branches from L4 (supplies L4-5 facet joint also); TP3, transverse process of L3.  Vitals:   11/18/17 1310 11/18/17 1320 11/18/17 1330 11/18/17 1339  BP: (!) 152/88 (!) 150/75 120/63 133/77  Pulse: 62  (!) 49   Resp: 16 16 16 16   Temp:      TempSrc:  SpO2: 96% 96% 97% 98%  Weight:      Height:        Start Time: 1250 hrs. End Time: 1307 hrs.  Imaging Guidance (Spinal):  Type of Imaging Technique: Fluoroscopy Guidance (Spinal) Indication(s): Assistance in needle guidance and placement for procedures requiring needle placement in or near specific anatomical locations not easily accessible without such assistance. Exposure Time: Please see nurses notes. Contrast: None used. Fluoroscopic Guidance: I was personally present during the use of fluoroscopy. "Tunnel Vision Technique" used to obtain the best possible view of the target area. Parallax error corrected before commencing the procedure. "Direction-depth-direction" technique used to introduce the needle under continuous pulsed fluoroscopy. Once target was reached, antero-posterior, oblique, and lateral fluoroscopic projection used confirm needle placement in all planes. Images permanently stored in EMR. Interpretation: No contrast injected. I personally interpreted the imaging intraoperatively. Adequate needle placement confirmed in multiple planes. Permanent images saved into the patient's record.  Antibiotic Prophylaxis:   Anti-infectives (From admission, onward)   None     Indication(s): None identified  Post-operative Assessment:  Post-procedure Vital Signs:  Pulse Rate: (!) 49 Temp: 98.2 F (36.8 C) Resp: 16 BP: 133/77 SpO2: 98 %  EBL: None  Complications: No immediate post-treatment complications observed by team, or reported by patient.  Note: The patient tolerated the entire procedure well. A repeat set of vitals were taken after  the procedure and the patient was kept under observation following institutional policy, for this type of procedure. Post-procedural neurological assessment was performed, showing return to baseline, prior to discharge. The patient was provided with post-procedure discharge instructions, including a section on how to identify potential problems. Should any problems arise concerning this procedure, the patient was given instructions to immediately contact us, at any time, without hesitation. In any case, we plan to contact the patient by telephone for a follow-up status report regarding this interventional procedure.  Comments:  No additional relevant information. 5 out of 5 strength bilateral lower extremity: Plantar flexion, dorsiflexion, knee flexion, knee extension.  Plan of Care   Imaging Orders     DG C-Arm 1-60 Min-No Report Procedure Orders    No procedure(s) ordered today    Medications ordered for procedure: Meds ordered this encounter  Medications  . lactated ringers infusion 1,000 mL  . fentaNYL (SUBLIMAZE) injection 25-100 mcg    Make sure Narcan is available in the pyxis when using this medication. In the event of respiratory depression (RR< 8/min): Titrate NARCAN (naloxone) in increments of 0.1 to 0.2 mg IV at 2-3 minute intervals, until desired degree of reversal.  . lidocaine (XYLOCAINE) 1 % (with pres) injection 10 mL  . ropivacaine (PF) 2 mg/mL (0.2%) (NAROPIN) injection 10 mL  . dexamethasone (DECADRON) injection 10 mg   Medications administered: We administered lactated ringers, fentaNYL, lidocaine, ropivacaine (PF) 2 mg/mL (0.2%), and dexamethasone.  See the medical record for exact dosing, route, and time of administration.  New Prescriptions   No medications on file   Disposition: Discharge home  Discharge Date & Time: 11/18/2017; 1339 hrs.   Physician-requested Follow-up: Return in about 1 month (around 12/16/2017) for Post Procedure Evaluation.  Future  Appointments  Date Time Provider [place]  12/24/2017 10:00 AM [name], MD [facility] None  12/30/2017 11:00 AM Dohmeier, Asencion Partridge, MD [facility] None   Primary Care Physician: [name], MD Location: [facility] Note by: [name], MD Date: 11/18/2017; Time: 1:50 PM  Disclaimer:  Medicine is not an exact  science. The only guarantee in medicine is that nothing is guaranteed. It is important to note that the decision to proceed with this intervention was based on the information collected from the patient. The Data and conclusions were drawn from the patient's questionnaire, the interview, and the physical examination. Because the information was provided in large part by the patient, it cannot be guaranteed that it has not been purposely or unconsciously manipulated. Every effort has been made to obtain as much relevant data as possible for this evaluation. It is important to note that the conclusions that lead to this procedure are derived in large part from the available data. Always take into account that the treatment will also be dependent on availability of resources and existing treatment guidelines, considered by other Pain Management Practitioners as being common knowledge and practice, at the time of the intervention. For Medico-Legal purposes, it is also important to point out that variation in procedural techniques and pharmacological choices are the acceptable norm. The indications, contraindications, technique, and results of the above procedure should only be interpreted and judged by a Board-Certified Interventional Pain Specialist with extensive familiarity and expertise in the same exact procedure and technique.

## 2017-11-19 ENCOUNTER — Telehealth: Payer: Self-pay | Admitting: *Deleted

## 2017-11-19 ENCOUNTER — Emergency Department (HOSPITAL_COMMUNITY): Payer: BC Managed Care – PPO

## 2017-11-19 ENCOUNTER — Other Ambulatory Visit: Payer: Self-pay

## 2017-11-19 ENCOUNTER — Encounter (HOSPITAL_COMMUNITY): Payer: Self-pay | Admitting: *Deleted

## 2017-11-19 ENCOUNTER — Telehealth: Payer: Self-pay | Admitting: Neurology

## 2017-11-19 ENCOUNTER — Observation Stay (HOSPITAL_COMMUNITY)
Admission: EM | Admit: 2017-11-19 | Discharge: 2017-11-20 | Disposition: A | Discharge status: Home / Self Care | Payer: BC Managed Care – PPO | Attending: Internal Medicine | Admitting: Internal Medicine

## 2017-11-19 DIAGNOSIS — R0789 Other chest pain: Principal | ICD-10-CM | POA: Insufficient documentation

## 2017-11-19 DIAGNOSIS — R079 Chest pain, unspecified: Secondary | ICD-10-CM | POA: Diagnosis not present

## 2017-11-19 DIAGNOSIS — R51 Headache: Secondary | ICD-10-CM | POA: Diagnosis present

## 2017-11-19 DIAGNOSIS — R519 Headache, unspecified: Secondary | ICD-10-CM

## 2017-11-19 DIAGNOSIS — Z79899 Other long term (current) drug therapy: Secondary | ICD-10-CM | POA: Diagnosis not present

## 2017-11-19 DIAGNOSIS — R9431 Abnormal electrocardiogram [ECG] [EKG]: Secondary | ICD-10-CM | POA: Diagnosis not present

## 2017-11-19 DIAGNOSIS — J45909 Unspecified asthma, uncomplicated: Secondary | ICD-10-CM | POA: Insufficient documentation

## 2017-11-19 LAB — URINALYSIS, ROUTINE W REFLEX MICROSCOPIC
Bacteria, UA: NONE SEEN
Bilirubin Urine: NEGATIVE
GLUCOSE, UA: NEGATIVE mg/dL
KETONES UR: NEGATIVE mg/dL
LEUKOCYTES UA: NEGATIVE
NITRITE: NEGATIVE
PH: 5 (ref 5.0–8.0)
Protein, ur: NEGATIVE mg/dL
Specific Gravity, Urine: >1.046 — ABNORMAL HIGH (ref 1.005–1.030)

## 2017-11-19 LAB — CBC
HEMATOCRIT: 41.4 % (ref 36.0–46.0)
HEMOGLOBIN: 14 g/dL (ref 12.0–15.0)
MCH: 27.8 pg (ref 26.0–34.0)
MCHC: 33.8 g/dL (ref 30.0–36.0)
MCV: 82.3 fL (ref 78.0–100.0)
Platelets: 226 10*3/uL (ref 150–400)
RBC: 5.03 MIL/uL (ref 3.87–5.11)
RDW: 13.9 % (ref 11.5–15.5)
WBC: 13.3 10*3/uL — ABNORMAL HIGH (ref 4.0–10.5)

## 2017-11-19 LAB — BASIC METABOLIC PANEL
ANION GAP: 9 (ref 5–15)
BUN: 12 mg/dL (ref 6–20)
CALCIUM: 9.2 mg/dL (ref 8.9–10.3)
CO2: 19 mmol/L — ABNORMAL LOW (ref 22–32)
Chloride: 111 mmol/L (ref 101–111)
Creatinine, Ser: 0.74 mg/dL (ref 0.44–1.00)
GFR calc Af Amer: >60 mL/min (ref >60)
Glucose, Bld: 133 mg/dL — ABNORMAL HIGH (ref 65–99)
POTASSIUM: 3.8 mmol/L (ref 3.5–5.1)
SODIUM: 139 mmol/L (ref 135–145)

## 2017-11-19 LAB — I-STAT TROPONIN, ED
TROPONIN I, POC: 0 ng/mL (ref 0.00–0.08)
Troponin i, poc: 0 ng/mL (ref 0.00–0.08)

## 2017-11-19 LAB — I-STAT BETA HCG BLOOD, ED (MC, WL, AP ONLY)

## 2017-11-19 LAB — D-DIMER, QUANTITATIVE (NOT AT ARMC): D-Dimer, Quant: 1.2 ug/mL-FEU — ABNORMAL HIGH (ref 0.00–0.50)

## 2017-11-19 LAB — TROPONIN I

## 2017-11-19 MED ORDER — METOCLOPRAMIDE HCL 5 MG/ML IJ SOLN
10.0000 mg | Freq: Once | INTRAMUSCULAR | Status: AC
  Administered 2017-11-19: 10 mg via INTRAVENOUS
  Filled 2017-11-19: qty 2

## 2017-11-19 MED ORDER — ALBUTEROL SULFATE (2.5 MG/3ML) 0.083% IN NEBU: 2.5000 mg | INHALATION_SOLUTION | Freq: Four times a day (QID) | RESPIRATORY_TRACT | Status: DC | PRN

## 2017-11-19 MED ORDER — HALOPERIDOL LACTATE 5 MG/ML IJ SOLN: 5.0000 mg | Freq: Every day | INTRAMUSCULAR | Status: DC | PRN

## 2017-11-19 MED ORDER — ENOXAPARIN SODIUM 40 MG/0.4ML ~~LOC~~ SOLN
40.0000 mg | SUBCUTANEOUS | Status: DC
  Administered 2017-11-19: 40 mg via SUBCUTANEOUS
  Filled 2017-11-19: qty 0.4

## 2017-11-19 MED ORDER — SODIUM CHLORIDE 0.9% FLUSH: 3.0000 mL | INTRAVENOUS | Status: DC | PRN

## 2017-11-19 MED ORDER — ACETAMINOPHEN 650 MG RE SUPP: 650.0000 mg | Freq: Four times a day (QID) | RECTAL | Status: DC | PRN

## 2017-11-19 MED ORDER — IOPAMIDOL (ISOVUE-370) INJECTION 76%
INTRAVENOUS | Status: AC
  Administered 2017-11-19: 100 mL
  Filled 2017-11-19: qty 100

## 2017-11-19 MED ORDER — SODIUM CHLORIDE 0.9 % IV SOLN: 250.0000 mL | INTRAVENOUS | Status: DC | PRN

## 2017-11-19 MED ORDER — FLUOXETINE HCL 20 MG PO CAPS
20.0000 mg | ORAL_CAPSULE | Freq: Every day | ORAL | Status: DC
  Administered 2017-11-19 – 2017-11-20 (×2): 20 mg via ORAL
  Filled 2017-11-19 (×3): qty 1

## 2017-11-19 MED ORDER — TIZANIDINE HCL 2 MG PO TABS
4.0000 mg | ORAL_TABLET | Freq: Three times a day (TID) | ORAL | Status: DC | PRN
  Filled 2017-11-19: qty 2

## 2017-11-19 MED ORDER — ESTRADIOL 1 MG PO TABS
1.0000 mg | ORAL_TABLET | Freq: Every day | ORAL | Status: DC
  Administered 2017-11-19 – 2017-11-20 (×2): 1 mg via ORAL
  Filled 2017-11-19 (×2): qty 1

## 2017-11-19 MED ORDER — SODIUM CHLORIDE 0.9 % IV BOLUS
500.0000 mL | Freq: Once | INTRAVENOUS | Status: AC
  Administered 2017-11-19: 500 mL via INTRAVENOUS

## 2017-11-19 MED ORDER — SODIUM CHLORIDE 0.9% FLUSH
3.0000 mL | Freq: Two times a day (BID) | INTRAVENOUS | Status: DC
  Administered 2017-11-20 (×2): 3 mL via INTRAVENOUS

## 2017-11-19 MED ORDER — TOPIRAMATE 100 MG PO TABS
100.0000 mg | ORAL_TABLET | Freq: Two times a day (BID) | ORAL | Status: DC
  Administered 2017-11-19 – 2017-11-20 (×2): 100 mg via ORAL
  Filled 2017-11-19: qty 4
  Filled 2017-11-19: qty 1

## 2017-11-19 MED ORDER — GABAPENTIN 300 MG PO CAPS
300.0000 mg | ORAL_CAPSULE | Freq: Every day | ORAL | Status: DC
  Administered 2017-11-19: 300 mg via ORAL
  Filled 2017-11-19: qty 1

## 2017-11-19 MED ORDER — CLONAZEPAM 0.5 MG PO TABS: 1.0000 mg | ORAL_TABLET | ORAL | Status: DC | PRN

## 2017-11-19 MED ORDER — ONDANSETRON HCL 4 MG PO TABS: 4.0000 mg | ORAL_TABLET | Freq: Three times a day (TID) | ORAL | Status: DC | PRN

## 2017-11-19 MED ORDER — LAMOTRIGINE 100 MG PO TABS
150.0000 mg | ORAL_TABLET | Freq: Two times a day (BID) | ORAL | Status: DC
  Administered 2017-11-19 – 2017-11-20 (×2): 150 mg via ORAL
  Filled 2017-11-19: qty 2
  Filled 2017-11-19 (×2): qty 1

## 2017-11-19 MED ORDER — TRAZODONE HCL 50 MG PO TABS: 50.0000 mg | ORAL_TABLET | Freq: Every evening | ORAL | Status: DC | PRN

## 2017-11-19 MED ORDER — KETOROLAC TROMETHAMINE 30 MG/ML IJ SOLN
30.0000 mg | Freq: Once | INTRAMUSCULAR | Status: AC
  Administered 2017-11-19: 30 mg via INTRAVENOUS
  Filled 2017-11-19: qty 1

## 2017-11-19 MED ORDER — METOPROLOL TARTRATE 25 MG PO TABS
25.0000 mg | ORAL_TABLET | Freq: Every day | ORAL | Status: DC
  Administered 2017-11-20: 25 mg via ORAL
  Filled 2017-11-19 (×2): qty 1

## 2017-11-19 MED ORDER — VITAMIN D (ERGOCALCIFEROL) 1.25 MG (50000 UNIT) PO CAPS
50000.0000 [IU] | ORAL_CAPSULE | ORAL | Status: DC
Start: 2017-11-19 — End: 2017-11-20
  Administered 2017-11-19: 50000 [IU] via ORAL
  Filled 2017-11-19: qty 1

## 2017-11-19 MED ORDER — ASPIRIN 325 MG PO TABS
325.0000 mg | ORAL_TABLET | Freq: Every day | ORAL | Status: DC
  Administered 2017-11-19 – 2017-11-20 (×2): 325 mg via ORAL
  Filled 2017-11-19 (×2): qty 1

## 2017-11-19 MED ORDER — PANTOPRAZOLE SODIUM 40 MG PO TBEC
40.0000 mg | DELAYED_RELEASE_TABLET | Freq: Every day | ORAL | Status: DC
  Administered 2017-11-19 – 2017-11-20 (×2): 40 mg via ORAL
  Filled 2017-11-19 (×2): qty 1

## 2017-11-19 MED ORDER — DICYCLOMINE HCL 20 MG PO TABS: 20.0000 mg | ORAL_TABLET | Freq: Four times a day (QID) | ORAL | Status: DC | PRN

## 2017-11-19 MED ORDER — SUMATRIPTAN SUCCINATE 50 MG PO TABS
50.0000 mg | ORAL_TABLET | ORAL | Status: DC | PRN
  Administered 2017-11-19: 50 mg via ORAL
  Filled 2017-11-19 (×2): qty 1

## 2017-11-19 MED ORDER — ACETAMINOPHEN 325 MG PO TABS
650.0000 mg | ORAL_TABLET | Freq: Four times a day (QID) | ORAL | Status: DC | PRN
  Administered 2017-11-20: 650 mg via ORAL
  Filled 2017-11-19: qty 2

## 2017-11-19 MED ORDER — FAMOTIDINE 20 MG PO TABS: 10.0000 mg | ORAL_TABLET | Freq: Every day | ORAL | Status: DC | PRN
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
## 2017-11-19 NOTE — Telephone Encounter (Signed)
Pt called she had bil facet joint and nerve block in the lower lumbar yesterday at noon. A headache started at 3:30 pm and she had a seizure around 10pm. Pt said she's had 6 seizures between 10pm and 6am this morning. Pt is at the ED now, she has been there since 8:10 this morning and has not been seen yet.  FYI

## 2017-11-19 NOTE — ED Triage Notes (Addendum)
To ED from home for eval of HA since having back procedure (pain injection) yesterday. States she has had 6 mini seizures since. Describes them as jerking that will wake her if she is sleeping. States she feels pressure in her chest. Ambulatory with cane. Appears in nad

## 2017-11-19 NOTE — ED Notes (Signed)
dela

## 2017-11-19 NOTE — Telephone Encounter (Signed)
Patient calling to inform us that she had a headache this morning and had several seizures. Is currently in the ED. Dr. Holley Raring notified. Patient reassurred that this is not related to the procedure she had yesterday.

## 2017-11-19 NOTE — Telephone Encounter (Signed)
LVM for patient to return call for any post-procedure concerns. -

## 2017-11-19 NOTE — Telephone Encounter (Signed)
There are antiepileptic medications on board :Topamax, Lamictal and Neurontin.   She uses these for headache control.  ED may need to order EEG today , make sure that she doesn't have a stiff neck or fever.   CD

## 2017-11-19 NOTE — ED Notes (Signed)
Pulse rate for patient currently 55-48; patient is asymptomatic; but called pharmacy to verify appropriateness of lopressor, pharmacy advised to ask doctor; doctor paged.

## 2017-11-19 NOTE — ED Notes (Signed)
Pt. Requested to leave pants on at this time.
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
## 2017-11-20 ENCOUNTER — Observation Stay (HOSPITAL_BASED_OUTPATIENT_CLINIC_OR_DEPARTMENT_OTHER): Payer: BC Managed Care – PPO

## 2017-11-20 ENCOUNTER — Other Ambulatory Visit: Payer: Self-pay

## 2017-11-20 DIAGNOSIS — R0789 Other chest pain: Secondary | ICD-10-CM

## 2017-11-20 DIAGNOSIS — R079 Chest pain, unspecified: Secondary | ICD-10-CM

## 2017-11-20 DIAGNOSIS — R9431 Abnormal electrocardiogram [ECG] [EKG]: Secondary | ICD-10-CM | POA: Diagnosis not present

## 2017-11-20 LAB — CBC
HCT: 38.7 % (ref 36.0–46.0)
Hemoglobin: 12.7 g/dL (ref 12.0–15.0)
MCH: 27.7 pg (ref 26.0–34.0)
MCHC: 32.8 g/dL (ref 30.0–36.0)
MCV: 84.3 fL (ref 78.0–100.0)
Platelets: 190 10*3/uL (ref 150–400)
RBC: 4.59 MIL/uL (ref 3.87–5.11)
RDW: 14.5 % (ref 11.5–15.5)
WBC: 10.6 10*3/uL — ABNORMAL HIGH (ref 4.0–10.5)

## 2017-11-20 LAB — COMPREHENSIVE METABOLIC PANEL
ALBUMIN: 3.4 g/dL — AB (ref 3.5–5.0)
ALK PHOS: 93 U/L (ref 38–126)
ALT: 15 U/L (ref 14–54)
ANION GAP: 9 (ref 5–15)
AST: 16 U/L (ref 15–41)
BUN: 16 mg/dL (ref 6–20)
CHLORIDE: 110 mmol/L (ref 101–111)
CO2: 21 mmol/L — AB (ref 22–32)
Calcium: 8.6 mg/dL — ABNORMAL LOW (ref 8.9–10.3)
Creatinine, Ser: 0.87 mg/dL (ref 0.44–1.00)
GFR calc Af Amer: >60 mL/min (ref >60)
GFR calc non Af Amer: >60 mL/min (ref >60)
GLUCOSE: 121 mg/dL — AB (ref 65–99)
Potassium: 3.6 mmol/L (ref 3.5–5.1)
SODIUM: 140 mmol/L (ref 135–145)
Total Bilirubin: 0.4 mg/dL (ref 0.3–1.2)
Total Protein: 6.9 g/dL (ref 6.5–8.1)

## 2017-11-20 LAB — ECHOCARDIOGRAM COMPLETE
Height: 62.5 in
WEIGHTICAEL: 4478.4 [oz_av]

## 2017-11-20 LAB — TROPONIN I
Troponin I: <0.03 ng/mL (ref <0.03)
Troponin I: <0.03 ng/mL (ref <0.03)

## 2017-11-20 LAB — MRSA PCR SCREENING: MRSA by PCR: NEGATIVE

## 2017-11-20 MED ORDER — KETOROLAC TROMETHAMINE 30 MG/ML IJ SOLN
30.0000 mg | Freq: Once | INTRAMUSCULAR | Status: AC
  Administered 2017-11-20: 30 mg via INTRAVENOUS

## 2017-11-20 MED ORDER — KETOROLAC TROMETHAMINE 30 MG/ML IJ SOLN
INTRAMUSCULAR | Status: AC
  Filled 2017-11-20: qty 1

## 2017-11-20 MED ORDER — ENOXAPARIN SODIUM 60 MG/0.6ML ~~LOC~~ SOLN: 60.0000 mg | SUBCUTANEOUS | Status: DC

## 2017-11-20 NOTE — Progress Notes (Signed)
Discharge instruction was given to pt and family. Belongings were sent home with pt.  Breton Berns, RN 

## 2017-11-20 NOTE — Progress Notes (Signed)
  Echocardiogram 2D Echocardiogram has been performed.  Ciani Rutten T Lakasha Mcfall 11/20/2017, 3:35 PM
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
## 2017-11-20 NOTE — ED Notes (Signed)
Floor coverage notified of pain level after PRN given.  New orders per NP Schorr.

## 2017-11-20 NOTE — Progress Notes (Signed)
PROGRESS NOTE    MARVELL STAVOLA  ZOX:[id number] DOB: [birth year] DOA: 11/19/2017 PCP: [name], MD  Brief Narrative:[name] [name]  is a 52 y.o. female, w morbid obeisity, Pafib, h/o cardiac 2016 with no CAD, TIA, seizure do apparently c/o chest pain "pressure" with radiation to the neck with slight dyspnea.  Pt had associated palpitations. This started night before last and has been constant since then H/o back injection previous day  Assessment & Plan:    Atypical  Chest pain -unclear etiology -no ACS -CTA negative for PE or acute Pulm  Process -no cAD on cath in 2016 -ECHO today  P.Afib -CHADs vasc score >3 -rate controlled in NSR now -h/o recurrent GI bleeds, no longer on anticoagulation -FU with primary cards Dr.Callwood  H/o CVA -continue ASA, see discussion on anticoagulation above   Morbid obesity  Chronic back pain -per PCP and pain medicine as outpatient  DVT prophylaxis: Code Status:  Family Communication: Disposition Plan:   Consultants:   Cards   Procedures:   Antimicrobials:    Subjective: -feels better, chest pain improved  Objective: Vitals:   11/20/17 0608 11/20/17 0643 11/20/17 0805 11/20/17 1212  BP: 125/67 131/76 (!) 117/56 118/64  Pulse: (!) 53 (!) 46 (!) 49 (!) 53  Resp: 20 17    Temp:  98.2 F (36.8 C) 98.1 F (36.7 C) 99.1 F (37.3 C)  TempSrc:  Oral Oral Oral  SpO2: 98% 99% 94% 98%  Weight:  127 kg (279 lb 14.4 oz)    Height:  5' 2.5" (1.588 m)      Intake/Output Summary (Last 24 hours) at 11/20/2017 1431 Last data filed at 11/19/2017 1731 Gross per 24 hour  Intake 500 ml  Output -  Net 500 ml   Filed Weights   11/19/17 0840 11/20/17 0643  Weight: 124.7 kg (275 lb) 127 kg (279 lb 14.4 oz)    Examination:  General exam: Appears calm and comfortable, obese no distress Respiratory system: Clear to auscultation. Respiratory effort normal. Cardiovascular system: S1 & S2 heard, RRR.  Gastrointestinal system:  Abdomen is nondistended, soft and nontender.Normal bowel sounds heard. Central nervous system: Alert and oriented. No focal neurological deficits. Extremities: Symmetric 5 x 5 power. Skin: No rashes, lesions or ulcers Psychiatry: Judgement and insight appear normal. Mood & affect appropriate.     Data Reviewed:   CBC: Recent Labs  Lab 11/19/17 0854 11/20/17 0148  WBC 13.3* 10.6*  HGB 14.0 12.7  HCT 41.4 38.7  MCV 82.3 84.3  PLT 226 811   Basic Metabolic Panel: Recent Labs  Lab 11/13/17 1449 11/19/17 0854 11/20/17 0148  NA 139 139 140  K 4.1 3.8 3.6  CL 106 111 110  CO2  --  19* 21*  GLUCOSE 107* 133* 121*  BUN 12 12 16   CREATININE 0.86 0.74 0.87  CALCIUM 9.0 9.2 8.6*   GFR: Estimated Creatinine Clearance: 99.7 mL/min (by C-G formula based on SCr of 0.87 mg/dL). Liver Function Tests: Recent Labs  Lab 11/13/17 1449 11/20/17 0148  AST 16 16  ALT  --  15  ALKPHOS 111 93  BILITOT 0.3 0.4  PROT 6.9 6.9  ALBUMIN 3.9 3.4*   No results for input(s): LIPASE, AMYLASE in the last 168 hours. No results for input(s): AMMONIA in the last 168 hours. Coagulation Profile: No results for input(s): INR, PROTIME in the last 168 hours. Cardiac Enzymes: Recent Labs  Lab 11/19/17 2006 11/20/17 0148 11/20/17 0649  TROPONINI <0.03 <0.03 <0.03  BNP (last 3 results) No results for input(s): PROBNP in the last 8760 hours. HbA1C: No results for input(s): HGBA1C in the last 72 hours. CBG: No results for input(s): GLUCAP in the last 168 hours. Lipid Profile: No results for input(s): CHOL, HDL, LDLCALC, TRIG, CHOLHDL, LDLDIRECT in the last 72 hours. Thyroid Function Tests: No results for input(s): TSH, T4TOTAL, FREET4, T3FREE, THYROIDAB in the last 72 hours. Anemia Panel: No results for input(s): VITAMINB12, FOLATE, FERRITIN, TIBC, IRON, RETICCTPCT in the last 72 hours. Urine analysis:    Component Value Date/Time   COLORURINE YELLOW 11/19/2017 2235   APPEARANCEUR CLEAR  11/19/2017 2235   APPEARANCEUR Clear 09/26/2014 2056   LABSPEC >1.046 (H) 11/19/2017 2235   LABSPEC 1.006 09/26/2014 2056   PHURINE 5.0 11/19/2017 2235   GLUCOSEU NEGATIVE 11/19/2017 2235   GLUCOSEU Negative 09/26/2014 2056   HGBUR SMALL (A) 11/19/2017 2235   BILIRUBINUR NEGATIVE 11/19/2017 2235   BILIRUBINUR Negative 09/26/2014 2056   KETONESUR NEGATIVE 11/19/2017 2235   PROTEINUR NEGATIVE 11/19/2017 2235   UROBILINOGEN 0.2 04/24/2015 0056   NITRITE NEGATIVE 11/19/2017 2235   LEUKOCYTESUR NEGATIVE 11/19/2017 2235   LEUKOCYTESUR Negative 09/26/2014 2056   Sepsis Labs: @LABRCNTIP (procalcitonin:4,lacticidven:4)  ) Recent Results (from the past 240 hour(s))  MRSA PCR Screening     Status: None   Collection Time: 11/20/17  6:46 AM  Result Value Ref Range Status   MRSA by PCR NEGATIVE NEGATIVE Final    Comment:        The GeneXpert MRSA Assay (FDA approved for NASAL specimens only), is one component of a comprehensive MRSA colonization surveillance program. It is not intended to diagnose MRSA infection nor to guide or monitor treatment for MRSA infections. Performed at [facility], [address] [place], [place] [address]          Radiology Studies: Dg Chest 2 View  Result Date: 11/19/2017 CLINICAL DATA:  Back injection yesterday with headaches EXAM: CHEST - 2 VIEW COMPARISON:  04/07/2017 FINDINGS: The heart size and mediastinal contours are within normal limits. Both lungs are clear. The visualized skeletal structures are unremarkable. IMPRESSION: No active cardiopulmonary disease. Electronically Signed   By: Inez Catalina M.D.   On: 11/19/2017 09:14   Ct Angio Chest Pe W And/or Wo Contrast  Result Date: 11/19/2017 CLINICAL DATA:  Chest pressure and shortness of breath. EXAM: CT ANGIOGRAPHY CHEST WITH CONTRAST TECHNIQUE: Multidetector CT imaging of the chest was performed using the standard protocol during bolus administration of intravenous contrast.  Multiplanar CT image reconstructions and MIPs were obtained to evaluate the vascular anatomy. CONTRAST:  175mL ISOVUE-370 IOPAMIDOL (ISOVUE-370) INJECTION 76% COMPARISON:  06/29/2016 FINDINGS: Cardiovascular: Pulmonary arterial opacification is adequate to the segmental level without evidence of emboli. There is no evidence of thoracic aortic dissection or aneurysm. The heart is mildly enlarged. There is no pericardial effusion. An aberrant right subclavian artery is noted, a normal variant. Mediastinum/Nodes: No enlarged axillary, mediastinal, or hilar lymph nodes. Unremarkable included thyroid and esophagus. Lungs/Pleura: No pleural effusion or pneumothorax. Minimal dependent atelectasis bilaterally. No mass. Upper Abdomen: Unremarkable. Musculoskeletal: No acute osseous abnormality or suspicious osseous lesion. Review of the MIP images confirms the above findings. IMPRESSION: No evidence of pulmonary emboli or other acute abnormality in the chest. Electronically Signed   By: Logan Bores M.D.   On: 11/19/2017 18:22        Scheduled Meds: . aspirin  325 mg Oral Daily  . enoxaparin (LOVENOX) injection  60 mg Subcutaneous Q24H  .  estradiol  1 mg Oral Daily  . FLUoxetine  20 mg Oral Daily  . gabapentin  300 mg Oral QHS  . lamoTRIgine  150 mg Oral BID  . metoprolol tartrate  25 mg Oral Daily  . pantoprazole  40 mg Oral Daily  . sodium chloride flush  3 mL Intravenous Q12H  . topiramate  100 mg Oral BID  . Vitamin D (Ergocalciferol)  50,000 Units Oral Q Tue   Continuous Infusions: . sodium chloride       LOS: 0 days    Time spent: 55min    Domenic Polite, MD [facility] Page via [email or web address], password [email or web address] After 7PM please contact night-coverage  11/20/2017, 2:31 PM

## 2017-11-21 ENCOUNTER — Encounter (HOSPITAL_COMMUNITY): Payer: Self-pay | Admitting: Psychiatry

## 2017-11-21 LAB — HIV ANTIBODY (ROUTINE TESTING W REFLEX): HIV Screen 4th Generation wRfx: NONREACTIVE

## 2017-11-22 ENCOUNTER — Ambulatory Visit (INDEPENDENT_AMBULATORY_CARE_PROVIDER_SITE_OTHER): Payer: Self-pay | Admitting: Licensed Clinical Social Worker

## 2017-11-22 ENCOUNTER — Encounter (HOSPITAL_COMMUNITY): Payer: Self-pay | Admitting: Licensed Clinical Social Worker

## 2017-11-22 ENCOUNTER — Telehealth: Payer: Self-pay | Admitting: *Deleted

## 2017-11-22 DIAGNOSIS — F4312 Post-traumatic stress disorder, chronic: Secondary | ICD-10-CM

## 2017-11-22 NOTE — Telephone Encounter (Addendum)
Pt Uncg form and fmla form faxed, copy mailed to pt.
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
## 2017-11-26 DIAGNOSIS — Z0289 Encounter for other administrative examinations: Secondary | ICD-10-CM
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
## 2017-12-18 ENCOUNTER — Ambulatory Visit: Payer: Self-pay | Admitting: Student in an Organized Health Care Education/Training Program

## 2017-12-24 ENCOUNTER — Other Ambulatory Visit: Payer: Self-pay

## 2017-12-24 ENCOUNTER — Ambulatory Visit
Payer: BC Managed Care – PPO | Attending: Student in an Organized Health Care Education/Training Program | Admitting: Student in an Organized Health Care Education/Training Program

## 2017-12-24 ENCOUNTER — Encounter: Payer: Self-pay | Admitting: Student in an Organized Health Care Education/Training Program

## 2017-12-24 VITALS — BP 139/86 | HR 67 | Temp 98.1°F | Resp 16 | Ht 62.0 in | Wt 271.0 lb

## 2017-12-24 DIAGNOSIS — Z833 Family history of diabetes mellitus: Secondary | ICD-10-CM | POA: Insufficient documentation

## 2017-12-24 DIAGNOSIS — Z8249 Family history of ischemic heart disease and other diseases of the circulatory system: Secondary | ICD-10-CM | POA: Insufficient documentation

## 2017-12-24 DIAGNOSIS — M47816 Spondylosis without myelopathy or radiculopathy, lumbar region: Secondary | ICD-10-CM | POA: Diagnosis not present

## 2017-12-24 DIAGNOSIS — R0789 Other chest pain: Secondary | ICD-10-CM | POA: Insufficient documentation

## 2017-12-24 DIAGNOSIS — Z9889 Other specified postprocedural states: Secondary | ICD-10-CM | POA: Diagnosis not present

## 2017-12-24 DIAGNOSIS — Z803 Family history of malignant neoplasm of breast: Secondary | ICD-10-CM | POA: Diagnosis not present

## 2017-12-24 DIAGNOSIS — Z881 Allergy status to other antibiotic agents status: Secondary | ICD-10-CM | POA: Insufficient documentation

## 2017-12-24 DIAGNOSIS — J45909 Unspecified asthma, uncomplicated: Secondary | ICD-10-CM | POA: Diagnosis not present

## 2017-12-24 DIAGNOSIS — Z818 Family history of other mental and behavioral disorders: Secondary | ICD-10-CM | POA: Insufficient documentation

## 2017-12-24 DIAGNOSIS — F419 Anxiety disorder, unspecified: Secondary | ICD-10-CM | POA: Diagnosis not present

## 2017-12-24 DIAGNOSIS — Z825 Family history of asthma and other chronic lower respiratory diseases: Secondary | ICD-10-CM | POA: Diagnosis not present

## 2017-12-24 DIAGNOSIS — E559 Vitamin D deficiency, unspecified: Secondary | ICD-10-CM | POA: Diagnosis not present

## 2017-12-24 DIAGNOSIS — K432 Incisional hernia without obstruction or gangrene: Secondary | ICD-10-CM | POA: Insufficient documentation

## 2017-12-24 DIAGNOSIS — Z79899 Other long term (current) drug therapy: Secondary | ICD-10-CM | POA: Insufficient documentation

## 2017-12-24 DIAGNOSIS — G8929 Other chronic pain: Secondary | ICD-10-CM | POA: Diagnosis not present

## 2017-12-24 DIAGNOSIS — I4891 Unspecified atrial fibrillation: Secondary | ICD-10-CM | POA: Insufficient documentation

## 2017-12-24 DIAGNOSIS — Z79891 Long term (current) use of opiate analgesic: Secondary | ICD-10-CM | POA: Diagnosis not present

## 2017-12-24 DIAGNOSIS — Z6841 Body Mass Index (BMI) 40.0 and over, adult: Secondary | ICD-10-CM | POA: Insufficient documentation

## 2017-12-24 DIAGNOSIS — M5137 Other intervertebral disc degeneration, lumbosacral region: Secondary | ICD-10-CM | POA: Diagnosis not present

## 2017-12-24 DIAGNOSIS — M549 Dorsalgia, unspecified: Secondary | ICD-10-CM

## 2017-12-24 DIAGNOSIS — Z880 Allergy status to penicillin: Secondary | ICD-10-CM | POA: Insufficient documentation

## 2017-12-24 DIAGNOSIS — M5117 Intervertebral disc disorders with radiculopathy, lumbosacral region: Secondary | ICD-10-CM | POA: Insufficient documentation

## 2017-12-24 DIAGNOSIS — Z79890 Hormone replacement therapy (postmenopausal): Secondary | ICD-10-CM | POA: Insufficient documentation

## 2017-12-24 DIAGNOSIS — R7303 Prediabetes: Secondary | ICD-10-CM | POA: Insufficient documentation

## 2017-12-24 DIAGNOSIS — G894 Chronic pain syndrome: Secondary | ICD-10-CM | POA: Insufficient documentation

## 2017-12-24 DIAGNOSIS — K219 Gastro-esophageal reflux disease without esophagitis: Secondary | ICD-10-CM | POA: Insufficient documentation

## 2017-12-24 DIAGNOSIS — G40909 Epilepsy, unspecified, not intractable, without status epilepticus: Secondary | ICD-10-CM | POA: Insufficient documentation

## 2017-12-24 DIAGNOSIS — Z8669 Personal history of other diseases of the nervous system and sense organs: Secondary | ICD-10-CM | POA: Diagnosis not present

## 2017-12-24 DIAGNOSIS — M542 Cervicalgia: Secondary | ICD-10-CM | POA: Diagnosis not present

## 2017-12-24 DIAGNOSIS — Z8673 Personal history of transient ischemic attack (TIA), and cerebral infarction without residual deficits: Secondary | ICD-10-CM | POA: Diagnosis not present

## 2017-12-24 DIAGNOSIS — Z90710 Acquired absence of both cervix and uterus: Secondary | ICD-10-CM | POA: Insufficient documentation

## 2017-12-24 MED ORDER — OXYCODONE HCL 5 MG PO TABS: 5.0000 mg | ORAL_TABLET | Freq: Every day | ORAL | 0 refills | Status: DC | PRN

## 2017-12-24 MED ORDER — TIZANIDINE HCL 4 MG PO TABS: 4.0000 mg | ORAL_TABLET | Freq: Three times a day (TID) | ORAL | 1 refills | Status: DC | PRN

## 2017-12-24 NOTE — Patient Instructions (Addendum)
1. Sign opioid contract 2. Repeat bilateral Lumbar facets____________________________________________________________________________________________  Preparing for Procedure with Sedation  Instructions: . Oral Intake: Do not eat or drink anything for at least 8 hours prior to your procedure. . Transportation: Public transportation is not allowed. Bring an adult driver. The driver must be physically present in our waiting room before any procedure can be started. Marland Kitchen Physical Assistance: Bring an adult physically capable of assisting you, in the event you need help. This adult should keep you company at home for at least 6 hours after the procedure. . Blood Pressure Medicine: Take your blood pressure medicine with a sip of water the morning of the procedure. . Blood thinners:  . Diabetics on insulin: Notify the staff so that you can be scheduled 1st case in the morning. If your diabetes requires high dose insulin, take only  of your normal insulin dose the morning of the procedure and notify the staff that you have done so. . Preventing infections: Shower with an antibacterial soap the morning of your procedure. . Build-up your immune system: Take 1000 mg of Vitamin C with every meal (3 times a day) the day prior to your procedure. Marland Kitchen Antibiotics: Inform the staff if you have a condition or reason that requires you to take antibiotics before dental procedures. . Pregnancy: If you are pregnant, call and cancel the procedure. . Sickness: If you have a cold, fever, or any active infections, call and cancel the procedure. . Arrival: You must be in the facility at least 30 minutes prior to your scheduled procedure. . Children: Do not bring children with you. . Dress appropriately: Bring dark clothing that you would not mind if they get stained. . Valuables: Do not bring any jewelry or valuables.  Procedure appointments are reserved for interventional treatments only. Marland Kitchen No Prescription Refills. . No  medication changes will be discussed during procedure appointments. . No disability issues will be discussed.  Remember:  Regular Business hours are:  Monday to Thursday 8:00 AM to 4:00 PM  Provider's Schedule: Milinda Pointer, MD:  Procedure days: Tuesday and Thursday 7:30 AM to 4:00 PM  [name], MD:  Procedure days: Monday and Wednesday 7:30 AM to 4:00 PM ____________________________________________________________________________________________

## 2017-12-24 NOTE — Progress Notes (Signed)
Safety precautions to be maintained throughout the outpatient stay will include: orient to surroundings, keep bed in low position, maintain call bell within reach at all times, provide assistance with transfer out of bed and ambulation.  

## 2017-12-24 NOTE — Progress Notes (Signed)
Patient's Name: [name] [name]  MRN: [id number]  Referring Provider: [name], MD  DOB: 23-Jul-1966  PCP: [name], MD  DOS: 12/24/2017  Note by: [name], MD  Service setting: Ambulatory outpatient  Specialty: Interventional Pain Management  Location: [facility] (AMB) Pain Management Facility    Patient type: Established   Primary Reason(s) for Visit: Encounter for post-procedure evaluation of chronic illness with mild to moderate exacerbation CC: Back Pain (lower)  HPI  [name] [name] is a 52 y.o. year old, female patient, who comes today for a post-procedure evaluation. She has Incisional hernia, without obstruction or gangrene; GERD (gastroesophageal reflux disease); Obesity, Class III, BMI 40-49.9 (morbid obesity) ([place]); Generalized convulsive epilepsy ([place]); History of recent fall; Paresthesia; CVA (cerebral infarction); Right sided weakness; Seizure disorder ([place]); Chronic headaches; Atrial fibrillation ([place]); Right arm weakness; Asthma, mild intermittent; TIA (transient ischemic attack); Morbid obesity ([place]); Intractable migraine with aura with status migrainosus; Chronic post-traumatic headache, not intractable; Migraine with aura and with status migrainosus, not intractable; Subarachnoid hemorrhage following injury, with loss of consciousness ([place]); TBI (traumatic brain injury) ([place]); Partial symptomatic epilepsy with complex partial seizures, not intractable, without status epilepticus ([place]); Headache; DDD (degenerative disc disease), lumbosacral; Lumbosacral radiculopathy; Surgical menopause on hormone replacement therapy; Incontinence in female; Status post TAH-BSO; Prediabetes; Vitamin D deficiency; Agitation; Pseudoseizure; Seizure-like activity ([place]); Seizures ([place]); Other parasomnia; Intractable migraine without aura and without status migrainosus; Nocturnal seizures ([place]); Anxiety; Asthma; Cerebral artery occlusion with cerebral infarction ([place]); Generalized muscle weakness; History of  CVA (cerebrovascular accident) without residual deficits; Other symptoms and signs involving the musculoskeletal system; Skin sensation disturbance; Migraine with aura; Migraines; Convulsions ([place]); Class 3 obesity in adult; Transient cerebral ischemia; Lumbar spondylosis; Chronic pain syndrome; Chronic upper back pain; Chest pain; Atypical chest pain; and T wave inversion in EKG on their problem list. Her primarily concern today is the Back Pain (lower)  Pain Assessment: Location: Lower Back Radiating: denies Onset: More than a month ago Duration: Chronic pain Quality: Sharp, Pounding, Aching, Dull Severity: 5 /10 (self-reported pain score)  Note: Reported level is compatible with observation.                         When using our objective Pain Scale, levels between 6 and 10/10 are said to belong in an emergency room, as it progressively worsens from a 6/10, described as severely limiting, requiring emergency care not usually available at an outpatient pain management facility. At a 6/10 level, communication becomes difficult and requires great effort. Assistance to reach the emergency department may be required. Facial flushing and profuse sweating along with potentially dangerous increases in heart rate and blood pressure will be evident. Effect on ADL:   Timing: Constant Modifying factors: nothing  [name] [name] comes in today for post-procedure evaluation after the treatment done on 11/18/2017.  Further details on both, my assessment(s), as well as the proposed treatment plan, please see below.  Post-Procedure Assessment  11/18/2017 Procedure: Bilateral L3, 4, 5 facet medial branch nerve block Pre-procedure pain score:  9/10 Post-procedure pain score: 0/10         Influential Factors: BMI: 49.57 kg/m Intra-procedural challenges: None observed.         Assessment challenges: None detected.              Reported side-effects: None.        Post-procedural adverse reactions or  complications: None reported         Sedation: Please  see nurses note. When no sedatives are used, the analgesic levels obtained are directly associated to the effectiveness of the local anesthetics. However, when sedation is provided, the level of analgesia obtained during the initial 1 hour following the intervention, is believed to be the result of a combination of factors. These factors may include, but are not limited to: 1. The effectiveness of the local anesthetics used. 2. The effects of the analgesic(s) and/or anxiolytic(s) used. 3. The degree of discomfort experienced by the patient at the time of the procedure. 4. The patients ability and reliability in recalling and recording the events. 5. The presence and influence of possible secondary gains and/or psychosocial factors. Reported result: Relief experienced during the 1st hour after the procedure: 50 % (Ultra-Short Term Relief)            Interpretative annotation: Clinically appropriate result. Analgesia during this period is likely to be Local Anesthetic and/or IV Sedative (Analgesic/Anxiolytic) related.          Effects of local anesthetic: The analgesic effects attained during this period are directly associated to the localized infiltration of local anesthetics and therefore cary significant diagnostic value as to the etiological location, or anatomical origin, of the pain. Expected duration of relief is directly dependent on the pharmacodynamics of the local anesthetic used. Long-acting (4-6 hours) anesthetics used.  Reported result: Relief during the next 4 to 6 hour after the procedure: 50 % (Short-Term Relief)            Interpretative annotation: Clinically appropriate result. Analgesia during this period is likely to be Local Anesthetic-related.          Long-term benefit: Defined as the period of time past the expected duration of local anesthetics (1 hour for short-acting and 4-6 hours for long-acting). With the possible  exception of prolonged sympathetic blockade from the local anesthetics, benefits during this period are typically attributed to, or associated with, other factors such as analgesic sensory neuropraxia, antiinflammatory effects, or beneficial biochemical changes provided by agents other than the local anesthetics.  Reported result: Extended relief following procedure: 25 % (Long-Term Relief)            Interpretative annotation: Clinically appropriate result. Good relief. No permanent benefit expected. Inflammation plays a part in the etiology to the pain.          Current benefits: Defined as reported results that persistent at this point in time.   Analgesia: 25-50 %            Function: [name] [name] reports improvement in function ROM: [name] [name] reports improvement in ROM Interpretative annotation: Recurrence of symptoms. No permanent benefit expected. Effective diagnostic intervention.          Interpretation: Results would suggest a successful diagnostic intervention. We'll proceed with diagnostic intervention #2, as soon as convenient          Plan:  Repeat treatment or therapy and compare extent and duration of benefits.                Laboratory Chemistry  Inflammation Markers (CRP: Acute Phase) (ESR: Chronic Phase) Lab Results  Component Value Date   LATICACIDVEN 0.54 09/05/2014                         Rheumatology Markers No results found for: RF, ANA, LABURIC, URICUR, LYMEIGGIGMAB, LYMEABIGMQN  Renal Function Markers Lab Results  Component Value Date   BUN 16 11/20/2017   CREATININE 0.87 11/20/2017   GFRAA >60 11/20/2017   GFRNONAA >60 11/20/2017                              Hepatic Function Markers Lab Results  Component Value Date   AST 16 11/20/2017   ALT 15 11/20/2017   ALBUMIN 3.4 (L) 11/20/2017   ALKPHOS 93 11/20/2017   AMYLASE 54 11/18/2012   LIPASE 11 11/23/2016                        Electrolytes Lab Results  Component Value Date    NA 140 11/20/2017   K 3.6 11/20/2017   CL 110 11/20/2017   CALCIUM 8.6 (L) 11/20/2017   MG 1.8 09/26/2014                        Neuropathy Markers Lab Results  Component Value Date   VITAMINB12 491 09/10/2016   FOLATE >20.0 09/10/2016   HGBA1C 5.5 10/09/2016   HIV Non Reactive 11/19/2017                        Bone Pathology Markers Lab Results  Component Value Date   VD25OH 23.5 (L) 10/09/2016   VD125OH2TOT 47 03/09/2015   [id number] 24 03/09/2015   HQ1975OI3 23 03/09/2015                         Coagulation Parameters Lab Results  Component Value Date   INR 1.04 02/05/2016   LABPROT 13.8 02/05/2016   APTT 35 02/05/2016   PLT 190 11/20/2017   DDIMER 1.20 (H) 11/19/2017                        Cardiovascular Markers Lab Results  Component Value Date   BNP 30.9 10/01/2014   CKTOTAL 101 05/29/2007   CKMB 1.2 05/29/2007   TROPONINI <0.03 11/20/2017   HGB 12.7 11/20/2017   HCT 38.7 11/20/2017                         CA Markers No results found for: CEA, CA125, LABCA2                      Note: Lab results reviewed.  Recent Diagnostic Imaging Results  ECHOCARDIOGRAM COMPLETE                            *[facility]*                         [address], [place] [address]                            [phone]  ------------------------------------------------------------------- Transthoracic Echocardiography  Patient:    [name] [name], [name] [name] MR #:       [id number] Study  Date: 11/20/2017 Gender:     F Age:        51 Height:     158.8 cm Weight:     127 kg BSA:        2.45 m^2 Pt. Status: Room:       6E01C   ADMITTING    Jani Gravel [id number]  HQPRFFMB     WGY, KZLDJ [id number]  Yevonne Aline [id number]  ATTENDING    Ephraim Hamburger  PERFORMING   [facility], Inpatient  SONOGRAPHER  Cardell Peach,  RDCS  cc:  ------------------------------------------------------------------- LV EF: 55% -   60%  ------------------------------------------------------------------- History:   PMH:  chest pain.  Atrial fibrillation.  Transient ischemic attack.  Risk factors:  Diabetes mellitus.  ------------------------------------------------------------------- Study Conclusions  - Left ventricle: The cavity size was normal. Wall thickness was   normal. Systolic function was normal. The estimated ejection   fraction was in the range of 55% to 60%. Wall motion was normal;   there were no regional wall motion abnormalities. Left   ventricular diastolic function parameters were normal. - Aortic valve: There was no stenosis. - Mitral valve: There was trivial regurgitation. - Right ventricle: The cavity size was normal. Systolic function   was normal. - Pulmonary arteries: No complete TR doppler jet so unable to   estimate PA systolic pressure. - Inferior vena cava: The vessel was normal in size. The   respirophasic diameter changes were in the normal range (>= 50%),   consistent with normal central venous pressure.  Impressions:  - Normal study.  ------------------------------------------------------------------- Study data:  Comparison was made to the study of 04/24/2015.  Study status:  Routine.  Procedure:  Transthoracic echocardiography. Image quality was adequate.  Study completion:  There were no complications.          Transthoracic echocardiography.  M-mode, complete 2D, spectral Doppler, and color Doppler.  Birthdate: Patient birthdate: 31-May-1966.  Age:  Patient is 52 yr old.  Sex: Gender: female.    BMI: 50.3 kg/m^2.  Blood pressure:     118/64 Patient status:  Inpatient.  Study date:  Study date: 11/20/2017. Study time: 03:07 PM.  Location:   Bedside.  -------------------------------------------------------------------  ------------------------------------------------------------------- Left ventricle:  The cavity size was normal. Wall thickness was normal. Systolic function was normal. The estimated ejection fraction was in the range of 55% to 60%. Wall motion was normal; there were no regional wall motion abnormalities. The transmitral flow pattern was normal. The deceleration time of the early transmitral flow velocity was normal. The pulmonary vein flow pattern was normal. The tissue Doppler parameters were normal. Left ventricular diastolic function parameters were normal.  ------------------------------------------------------------------- Aortic valve:   Trileaflet.  Doppler:   There was no stenosis. There was no regurgitation.    Peak velocity ratio of LVOT to aortic valve: 0.59. Indexed valve area (Vmax): 0.76 cm^2/m^2. Peak gradient (S): 10 mm Hg.  ------------------------------------------------------------------- Aorta:  Aortic root: The aortic root was normal in size. Ascending aorta: The ascending aorta was normal in size.  ------------------------------------------------------------------- Mitral valve:   Normal thickness leaflets .  Doppler:   There was no evidence for stenosis.   There was trivial regurgitation. Valve area by pressure half-time: 3.61 cm^2. Indexed valve area by pressure half-time: 1.47 cm^2/m^2.    Peak gradient (D): 3 mm Hg.   ------------------------------------------------------------------- Left atrium:  The atrium was normal in size.  ------------------------------------------------------------------- Right ventricle:  The cavity size was normal. Systolic function was normal.  -------------------------------------------------------------------  Pulmonic valve:    Structurally normal valve.   Cusp separation was normal.  Doppler:  Transvalvular velocity was within the  normal range. There was trivial regurgitation.  ------------------------------------------------------------------- Tricuspid valve:   Doppler:  There was no significant regurgitation.  ------------------------------------------------------------------- Pulmonary artery:   No complete TR doppler jet so unable to estimate PA systolic pressure.  ------------------------------------------------------------------- Right atrium:  The atrium was normal in size.  ------------------------------------------------------------------- Pericardium:  There was no pericardial effusion.  ------------------------------------------------------------------- Systemic veins: Inferior vena cava: The vessel was normal in size. The respirophasic diameter changes were in the normal range (>= 50%), consistent with normal central venous pressure.  ------------------------------------------------------------------- Measurements   Left ventricle                           Value          Reference  LV ID, ED, PLAX chordal                  43    mm       43 - 52  LV ID, ES, PLAX chordal                  27    mm       23 - 38  LV fx shortening, PLAX chordal           37    %        >=29  LV PW thickness, ED                      10    mm       ----------  IVS/LV PW ratio, ED                      1              <=1.3  Stroke volume, 2D                        62    ml       ----------  Stroke volume/bsa, 2D                    25    ml/m^2   ----------  LV e&', lateral                           13.4  cm/s     ----------  LV E/e&', lateral                         6.97           ----------  LV e&', medial                            9.9   cm/s     ----------  LV E/e&', medial                          9.43           ----------  LV e&', average                           11.65 cm/s     ----------  LV E/e&', average                         8.02           ----------    Ventricular septum                       Value           Reference  IVS thickness, ED                        10    mm       ----------    LVOT                                     Value          Reference  LVOT ID, S                               20    mm       ----------  LVOT area                                3.14  cm^2     ----------  LVOT peak velocity, S                    92    cm/s     ----------  LVOT mean velocity, S                    62.7  cm/s     ----------  LVOT VTI, S                              19.9  cm       ----------  LVOT peak gradient, S                    3     mm Hg    ----------    Aortic valve                             Value          Reference  Aortic valve peak velocity, S            156   cm/s     ----------  Aortic peak gradient, S                  10    mm Hg    ----------  Velocity ratio, peak, LVOT/AV            0.59           ----------  Aortic valve area/bsa, peak              0.76  cm^2/m^2 ----------  velocity    Aorta                                    Value  Reference  Aortic root ID, ED                       22    mm       ----------    Left atrium                              Value          Reference  LA ID, A-P, ES                           34    mm       ----------  LA ID/bsa, A-P                           1.39  cm/m^2   <=2.2  LA volume, S                             57.8  ml       ----------  LA volume/bsa, S                         23.6  ml/m^2   ----------  LA volume, ES, 1-p A4C                   48.4  ml       ----------  LA volume/bsa, ES, 1-p A4C               19.8  ml/m^2   ----------  LA volume, ES, 1-p A2C                   64.2  ml       ----------  LA volume/bsa, ES, 1-p A2C               26.2  ml/m^2   ----------    Mitral valve                             Value          Reference  Mitral E-wave peak velocity              93.4  cm/s     ----------  Mitral A-wave peak velocity              51.8  cm/s     ----------  Mitral deceleration time                 208   ms        150 - 230  Mitral pressure half-time                61    ms       ----------  Mitral peak gradient, D                  3     mm Hg    ----------  Mitral E/A ratio, peak                   1.8            ----------  Mitral valve area, PHT, DP  3.61  cm^2     ----------  Mitral valve area/bsa, PHT, DP           1.47  cm^2/m^2 ----------    Right atrium                             Value          Reference  RA ID, S-I, ES, A4C              (H)     49.5  mm       34 - 49  RA area, ES, A4C                         14.6  cm^2     8.3 - 19.5  RA volume, ES, A/L                       34.8  ml       ----------  RA volume/bsa, ES, A/L                   14.2  ml/m^2   ----------    Systemic veins                           Value          Reference  Estimated CVP                            8     mm Hg    ----------    Right ventricle                          Value          Reference  RV ID, minor axis, ED, A4C base          37    mm       ----------  TAPSE                                    26.7  mm       ----------  RV s&', lateral, S                        10.1  cm/s     ----------  Legend: (L)  and  (H)  mark values outside specified reference range.  ------------------------------------------------------------------- Prepared and Electronically Authenticated by  Loralie Champagne, M.D. 2019-03-27T16:52:43  Complexity Note: Imaging results reviewed. Results shared with [name] [name], using Layman's terms.                         Meds   Current Outpatient Medications:  .  albuterol (PROVENTIL HFA;VENTOLIN HFA) 108 (90 BASE) MCG/ACT inhaler, Inhale 2 puffs into the lungs every 6 (six) hours as needed for wheezing or shortness of breath. , Disp: , Rfl:  .  albuterol (PROVENTIL) (2.5 MG/3ML) 0.083% nebulizer solution, Take 2.5 mg by nebulization every 6 (six) hours as needed for wheezing or shortness of breath., Disp: , Rfl:  .  clonazePAM (KLONOPIN) 1 MG tablet, Take 1 tablet (1 mg  total) as needed  by mouth (at onset of a seizure)., Disp: 90 tablet, Rfl: 0 .  dicyclomine (BENTYL) 20 MG tablet, Take 1 tablet by mouth every 6 (six) hours as needed., Disp: , Rfl:  .  estradiol (ESTRACE) 1 MG tablet, TAKE 1 TABLET BY MOUTH EVERY DAY (Patient taking differently: TAKE 13m  TABLET BY MOUTH EVERY DAY), Disp: 90 tablet, Rfl: 2 .  FLUoxetine (PROZAC) 20 MG capsule, Take 1 capsule (20 mg total) by mouth daily., Disp: 90 capsule, Rfl: 1 .  Fremanezumab-vfrm (AJOVY) 225 MG/1.5ML SOSY, Inject 225 mg every 30 (thirty) days into the skin., Disp: 1 Syringe, Rfl: 11 .  gabapentin (NEURONTIN) 300 MG capsule, Take 1 capsule (300 mg total) at bedtime by mouth., Disp: 90 capsule, Rfl: 3 .  lamoTRIgine (LAMICTAL) 150 MG tablet, Take 1 tablet (150 mg total) 2 (two) times daily by mouth., Disp: 180 tablet, Rfl: 3 .  metoprolol tartrate (LOPRESSOR) 25 MG tablet, Take 1 tablet (25 mg total) by mouth 2 (two) times daily. (Patient taking differently: Take 25 mg by mouth daily. ), Disp: 60 tablet, Rfl: 0 .  omeprazole (PRILOSEC) 40 MG capsule, Take 40 mg by mouth daily., Disp: , Rfl:  .  ondansetron (ZOFRAN) 4 MG tablet, Take 1 tablet (4 mg total) by mouth every 8 (eight) hours as needed for nausea or vomiting., Disp: 10 tablet, Rfl: 0 .  ranitidine (ZANTAC) 300 MG tablet, Take 300 mg by mouth daily as needed for heartburn. , Disp: , Rfl:  .  SUMAtriptan (IMITREX) 50 MG tablet, Take 1 tablet (50 mg total) every 2 (two) hours as needed by mouth for migraine. May repeat in 2 hours if headache persists or recurs., Disp: 30 tablet, Rfl: 1 .  tiZANidine (ZANAFLEX) 4 MG capsule, Take 1 capsule (4 mg total) by mouth 3 (three) times daily as needed for muscle spasms., Disp: 90 capsule, Rfl: 0 .  topiramate (TOPAMAX) 100 MG tablet, Take 1 tablet (100 mg total) 2 (two) times daily by mouth., Disp: 180 tablet, Rfl: 3 .  traZODone (DESYREL) 50 MG tablet, Take 1 tablet (50 mg total) by mouth at bedtime as needed for  sleep., Disp: 90 tablet, Rfl: 1 .  Vitamin D, Ergocalciferol, (DRISDOL) 50000 units CAPS capsule, Take 1 capsule (50,000 Units total) by mouth every Tuesday., Disp: 30 capsule, Rfl: 0 .  oxyCODONE (OXY IR/ROXICODONE) 5 MG immediate release tablet, Take 1 tablet (5 mg total) by mouth daily as needed for severe pain., Disp: 14 tablet, Rfl: 0  Current Facility-Administered Medications:  .  Fremanezumab-vfrm SOSY 1.5 mL, 1.5 mL, Subcutaneous, Q30 days, Dohmeier, CAsencion Partridge MD  ROS  Constitutional: Denies any fever or chills Gastrointestinal: No reported hemesis, hematochezia, vomiting, or acute GI distress Musculoskeletal: Denies any acute onset joint swelling, redness, loss of ROM, or weakness Neurological: No reported episodes of acute onset apraxia, aphasia, dysarthria, agnosia, amnesia, paralysis, loss of coordination, or loss of consciousness  Allergies  [name] [name] is allergic to amoxicillin; penicillins; biaxin [clarithromycin]; erythromycin base; and zithromax [azithromycin].  PFSH  Drug: [name] [name] reports that she does not use drugs. Alcohol:  reports that she does not drink alcohol. Tobacco:  reports that she has never smoked. She has never used smokeless tobacco. Medical:  has a past medical history of Anginal pain ([place], Anxiety, Asthma, Atrial fibrillation ([place], Chest pain, Chronic back pain, Chronic bronchitis ([place], Complication of anesthesia, Daily headache, Epilepsy ([place], Fibroid, GERD (gastroesophageal reflux disease), Head injury (1975; 2015), Joint pain, Migraine, Obesity, Class III,  BMI 40-49.9 (morbid obesity) ([place]) (12/21/2012), Ovarian cyst, Pre-diabetes, Seizures ([place]), Shortness of breath dyspnea, TBI (traumatic brain injury) ([place]) (2015), TIA (transient ischemic attack) (04/2015?), and Vitamin D deficiency. Surgical: [name] [name]  has a past surgical history that includes Cesarean section (1989); Esophagogastroduodenoscopy (N/A, 11/21/2012); Colonoscopy (N/A, 11/21/2012);  Incisional hernia repair (N/A, 12/17/2012); Insertion of mesh (N/A, 12/17/2012); Cardiac catheterization (N/A, 01/21/2015); Appendectomy (1990s); Hernia repair; Dilation and curettage of uterus (1985); Ovarian cyst removal (2007; 2014); Abdominal hysterectomy (2005); Colonoscopy with propofol (N/A, 09/02/2017); and Esophagogastroduodenoscopy (egd) with propofol (N/A, 09/02/2017). Family: family history includes Breast cancer in her cousin; COPD in her father; Depression in her mother; Diabetes in her father; Emphysema in her father; Heart attack in her father; Heart disease in her father and mother; Hypertension in her father and mother.  Constitutional Exam  General appearance: Well nourished, well developed, and well hydrated. In no apparent acute distress Vitals:   12/24/17 1012  BP: 139/86  Pulse: 67  Resp: 16  Temp: 98.1 F (36.7 C)  TempSrc: Oral  SpO2: 94%  Weight: 271 lb (122.9 kg)  Height: 5' 2" (1.575 m)   BMI Assessment: Estimated body mass index is 49.57 kg/m as calculated from the following:   Height as of this encounter: 5' 2" (1.575 m).   Weight as of this encounter: 271 lb (122.9 kg).  BMI interpretation table: BMI level Category Range association with higher incidence of chronic pain  <18 kg/m2 Underweight   18.5-24.9 kg/m2 Ideal body weight   25-29.9 kg/m2 Overweight Increased incidence by 20%  30-34.9 kg/m2 Obese (Class I) Increased incidence by 68%  35-39.9 kg/m2 Severe obesity (Class II) Increased incidence by 136%  >40 kg/m2 Extreme obesity (Class III) Increased incidence by 254%   Patient's current BMI Ideal Body weight  Body mass index is 49.57 kg/m. Ideal body weight: 50.1 kg (110 lb 7.2 oz) Adjusted ideal body weight: 79.2 kg (174 lb 10.7 oz)   BMI Readings from Last 4 Encounters:  12/24/17 49.57 kg/m  11/20/17 50.38 kg/m  11/18/17 49.50 kg/m  11/13/17 49.14 kg/m   Wt Readings from Last 4 Encounters:  12/24/17 271 lb (122.9 kg)  11/20/17 279 lb 14.4  oz (127 kg)  11/18/17 275 lb (124.7 kg)  11/13/17 273 lb (123.8 kg)  Psych/Mental status: Alert, oriented x 3 (person, place, & time)       Eyes: PERLA Respiratory: No evidence of acute respiratory distress  Cervical Spine Area Exam  Skin & Axial Inspection: No masses, redness, edema, swelling, or associated skin lesions Alignment: Symmetrical Functional ROM: Unrestricted ROM      Stability: No instability detected Muscle Tone/Strength: Functionally intact. No obvious neuro-muscular anomalies detected. Sensory (Neurological): Unimpaired Palpation: No palpable anomalies              Upper Extremity (UE) Exam    Side: Right upper extremity  Side: Left upper extremity  Skin & Extremity Inspection: Skin color, temperature, and hair growth are WNL. No peripheral edema or cyanosis. No masses, redness, swelling, asymmetry, or associated skin lesions. No contractures.  Skin & Extremity Inspection: Skin color, temperature, and hair growth are WNL. No peripheral edema or cyanosis. No masses, redness, swelling, asymmetry, or associated skin lesions. No contractures.  Functional ROM: Unrestricted ROM          Functional ROM: Unrestricted ROM          Muscle Tone/Strength: Functionally intact. No obvious neuro-muscular anomalies detected.  Muscle Tone/Strength: Functionally intact. No obvious neuro-muscular anomalies  detected.  Sensory (Neurological): Unimpaired          Sensory (Neurological): Unimpaired          Palpation: No palpable anomalies              Palpation: No palpable anomalies              Specialized Test(s): Deferred         Specialized Test(s): Deferred          Thoracic Spine Area Exam  Skin & Axial Inspection: No masses, redness, or swelling Alignment: Symmetrical Functional ROM: Unrestricted ROM Stability: No instability detected Muscle Tone/Strength: Functionally intact. No obvious neuro-muscular anomalies detected. Sensory (Neurological): Unimpaired Muscle strength & Tone:  No palpable anomalies  Lumbar Spine Area Exam  Skin & Axial Inspection: No masses, redness, or swelling Alignment: Symmetrical Functional ROM: Decreased ROM       Stability: No instability detected Muscle Tone/Strength: Functionally intact. No obvious neuro-muscular anomalies detected. Sensory (Neurological): Unimpaired Palpation: No palpable anomalies       Provocative Tests: Lumbar Hyperextension and rotation test: Positive bilaterally for facet joint pain. Lumbar Lateral bending test: Positive due to pain. Patrick's Maneuver: evaluation deferred today                    Gait & Posture Assessment  Ambulation: Unassisted Gait: Relatively normal for age and body habitus Posture: WNL   Lower Extremity Exam    Side: Right lower extremity  Side: Left lower extremity  Skin & Extremity Inspection: Skin color, temperature, and hair growth are WNL. No peripheral edema or cyanosis. No masses, redness, swelling, asymmetry, or associated skin lesions. No contractures.  Skin & Extremity Inspection: Skin color, temperature, and hair growth are WNL. No peripheral edema or cyanosis. No masses, redness, swelling, asymmetry, or associated skin lesions. No contractures.  Functional ROM: Unrestricted ROM          Functional ROM: Unrestricted ROM          Muscle Tone/Strength: Functionally intact. No obvious neuro-muscular anomalies detected.  Muscle Tone/Strength: Functionally intact. No obvious neuro-muscular anomalies detected.  Sensory (Neurological): Unimpaired  Sensory (Neurological): Unimpaired  Palpation: No palpable anomalies  Palpation: No palpable anomalies   Assessment  Primary Diagnosis & Pertinent Problem List: The primary encounter diagnosis was Lumbar spondylosis. Diagnoses of Lumbar facet arthropathy, DDD (degenerative disc disease), lumbosacral, Chronic pain syndrome, and Chronic upper back pain were also pertinent to this visit.  Status Diagnosis  Responding Responding Responding  1. Lumbar spondylosis   2. Lumbar facet arthropathy   3. DDD (degenerative disc disease), lumbosacral   4. Chronic pain syndrome   5. Chronic upper back pain     General Recommendations: The pain condition that the patient suffers from is best treated with a multidisciplinary approach that involves an increase in physical activity to prevent de-conditioning and worsening of the pain cycle, as well as psychological counseling (formal and/or informal) to address the co-morbid psychological affects of pain. Treatment will often involve judicious use of pain medications and interventional procedures to decrease the pain, allowing the patient to participate in the physical activity that will ultimately produce long-lasting pain reductions. The goal of the multidisciplinary approach is to return the patient to a higher level of overall function and to restore their ability to perform activities of daily living.   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  Patient's back pain, leg pain and neck pain have gotten worse over the last couple of years.  She has had previous epidural steroid injections the last 1 of which was in July 2017 and was not effective.  Patient has been evaluated by neurosurgery in December 2018.  Lumbar and cervical MRIs were ordered.  Cervical MRI was largely unremarkable, lumbar MRI primarily showed facet arthropathy at L4-L5 and L5-S1.  Patient does have low back pain that is reproducible with axial loading and lateral rotation suggesting a facet mediated pain pattern. 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  Patient  follows up today status post bilateral L3, 4, 5 facet medial branch nerve blocks which provided her with significant pain relief and improve range of motion for approximately 4 to 5 days after her block with ongoing benefit that has reduced to approximately 25 to 30% since her block.  Patient is continue to endorse significant spasms in her posterior thoracic region which started after she bent down performing activities of daily living.  We discussed repeating bilateral lumbar facet blocks #2 at L3, 4, 5 lumbar spondylosis and facet arthropathy.  Patient continues tizanidine and occasionally takes that night however she states that it does make her sedated.  She is requesting something to take during the day for breakthrough pain.  We discussed low-dose oxycodone 5 mg that she can take only as needed for her severe muscle spasms that are refractory to tizanidine.  Patient instructed to continue her gabapentin 300 mg nightly, Topamax 100 mg twice daily.  Plan: -Repeat bilateral lumbar facet medial branch nerve blocks #2 at L3, 4, 5 with sedation.  Consider radiofrequency ablation thereafter -Prescription for oxycodone 5 mg daily as needed severe breakthrough pain, quantity 14 (PMP checked and appropriate, UDS up-to-date and appropriate) -Continue gabapentin 300 mg nightly, Topamax 100 mg twice daily   Plan of Care  Pharmacotherapy (Medications Ordered): Meds ordered this encounter  Medications  . oxyCODONE (OXY IR/ROXICODONE) 5 MG immediate release tablet    Sig: Take 1 tablet (5 mg total) by mouth daily as needed for severe pain.    Dispense:  14 tablet    Refill:  0    Do not place this medication, or any other prescription from our practice, on "Automatic Refill". Patient may have prescription filled one day early if pharmacy is closed on scheduled refill date. Do not fill until:  To last until:   Lab-work, procedure(s), and/or referral(s): Orders Placed This Encounter  Procedures  .  LUMBAR FACET(MEDIAL BRANCH NERVE BLOCK) MB[facility]    Provider-requested follow-up: Return in about 2 weeks (around 01/07/2018) for Procedure. Time Note: Greater than 50% of the 25 minute(s) of face-to-face time spent with [name] [name], was spent in counseling/coordination of care regarding: [name] [name] primary cause of pain, the treatment plan, treatment alternatives, the risks and possible complications of proposed treatment, going over the informed consent, the opioid analgesic risks and possible complications, the results, interpretation and significance of  her recent diagnostic interventional treatment(s), the appropriate use of her medications, realistic expectations, the medication agreement, the importance of providing [name] with accurate post-procedure information and the patient's responsibilities when it comes to controlled substances. Future Appointments  Date Time Provider [place]  12/30/2017 11:00  AM Dohmeier, Asencion Partridge, MD [facility] None  01/21/2018  8:30 AM Daron Offer, Richard Miu, MD [facility] None  01/27/2018  9:00 AM Juliane Poot None    Primary Care Physician: [name], MD Location: [facility] Note by: [name], M.D Date: 12/24/2017; Time: 10:38 AM  Patient Instructions  1. Sign opioid contract 2. Repeat bilateral Lumbar facets

## 2017-12-30 ENCOUNTER — Ambulatory Visit: Payer: BC Managed Care – PPO | Admitting: Neurology

## 2017-12-30 ENCOUNTER — Encounter: Payer: Self-pay | Admitting: Neurology

## 2017-12-30 VITALS — BP 133/79 | HR 51 | Ht 62.0 in | Wt 276.0 lb

## 2017-12-30 DIAGNOSIS — J4540 Moderate persistent asthma, uncomplicated: Secondary | ICD-10-CM | POA: Diagnosis not present

## 2017-12-30 DIAGNOSIS — G43109 Migraine with aura, not intractable, without status migrainosus: Secondary | ICD-10-CM

## 2017-12-30 DIAGNOSIS — Z6841 Body Mass Index (BMI) 40.0 and over, adult: Secondary | ICD-10-CM

## 2017-12-30 MED ORDER — CLONAZEPAM 1 MG PO TABS: ORAL_TABLET | ORAL | 0 refills | Status: DC

## 2017-12-30 MED ORDER — SUMATRIPTAN SUCCINATE 50 MG PO TABS: 50.0000 mg | ORAL_TABLET | ORAL | 1 refills | Status: DC | PRN

## 2017-12-30 NOTE — Progress Notes (Signed)
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 reviewed patient records, labs, notes, testing and imaging myself where available.  Lab Results  Component Value Date   WBC 10.6 (H) 11/20/2017   HGB 12.7 11/20/2017   HCT 38.7 11/20/2017   MCV 84.3 11/20/2017   PLT 190 11/20/2017      Component Value Date/Time   NA 140 11/20/2017 0148   NA 139 11/13/2017 1449   NA 140 12/01/2014 1109   K 3.6 11/20/2017 0148   K 3.8 12/01/2014 1109   CL 110 11/20/2017 0148   CL 111 12/01/2014 1109   CO2 21 (L) 11/20/2017 0148   CO2 23 12/01/2014 1109   GLUCOSE 121 (H) 11/20/2017 0148   GLUCOSE 99 12/01/2014 1109   BUN 16 11/20/2017 0148   BUN 12 11/13/2017 1449   BUN 17 12/01/2014 1109   CREATININE 0.87 11/20/2017 0148   CREATININE 0.83 12/01/2014 1109   CREATININE 0.82 06/22/2013 1152   CALCIUM 8.6 (L) 11/20/2017 0148   CALCIUM 8.7 (L) 12/01/2014 1109   PROT 6.9 11/20/2017 0148   PROT 6.9 11/13/2017 1449   PROT 7.7  09/26/2014 1837   ALBUMIN 3.4 (L) 11/20/2017 0148   ALBUMIN 3.9 11/13/2017 1449   ALBUMIN 3.5 09/26/2014 1837   AST 16 11/20/2017 0148   AST 17 09/26/2014 1837   ALT 15 11/20/2017 0148   ALT 24 09/26/2014 1837   ALKPHOS 93 11/20/2017 0148   ALKPHOS 116 09/26/2014 1837   BILITOT 0.4 11/20/2017 0148   BILITOT 0.3 11/13/2017 1449   BILITOT 0.2 09/26/2014 1837   GFRNONAA >60 11/20/2017 0148   GFRNONAA >60 12/01/2014 1109   GFRAA >60 11/20/2017 0148   GFRAA >60 12/01/2014 1109   Lab Results  Component Value Date   CHOL 130 10/09/2016   HDL 41 10/09/2016   LDLCALC 72 10/09/2016   TRIG 84 10/09/2016   CHOLHDL 3.2 10/09/2016   Lab Results  Component Value Date   HGBA1C 5.5 10/09/2016   Lab Results  Component Value Date   FUXNATFT73 220 09/10/2016   Lab Results  Component Value Date   TSH 1.970 10/09/2016      ASSESSMENT AND PLAN  52 y.o. year old female  has a past medical history of non cardiac Anginal pain ([place]); Anxiety; Asthma; Atrial fibrillation ([place]); Back pain; seizure versus  Epilepsy ([place]); Fibroid; GERD (gastroesophageal reflux disease); Head injury; J Migraine; Obesity, Class III, BMI 40-49.9 (morbid obesity) ([place]) (12/21/2012); Ovarian cyst; Prediabetes; Seizures ([place]); Shortness of breath dyspnea;  Surgical menopause and Vitamin D deficiency. here to follow-up for    1 Seizure- like activity,  in practice EEG without  epileptiform activity . EEG documented no change during a spell.  Her spells have been less frequent since her EMU stay in [place] was completed.  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 migranol has not worked. Due to a possible TIA.Marland Kitchen She has at least 8 migraines a month, and 5 headaches of other quality, changing her  social life and impairing her pr oductivity.  Received AJOVY in office through my RN, Gerline Legacy.   (depakote Iv gives temporary relief- which did not last- Depakote PO has let to elevated LFTs and weight gain. Changed to Lamictal at that time.  Trial of Imitrex at 50 mg was " only taking the edge off". )  3. Mild obstructive sleep apnea without significant hypoxemia, normal sleep EEG, best treatment option was decided to be weight loss.  The patient will make a new appointment with Dr. Leafy Ro after her second epilepsy monitoring unit stays completed and we will work together on reducing her body mass index.   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  She has been struggling to get documentation form this incident in [facility].      PLAN:  Imitrex 50 mg up to 2 in 24 hours. Not more frequent than 5 a week.  She has started Ajovy without significant success but I want her to keep a journal of headches, She clearly describes migraine with nausea and vision changes, photophobia.   I will keep Lamictal at 150 mg twice daily Continue Topamax 100 mg twice daily. She needs to continue hydrating aggessively.  Reviewed recent blood work, and ED evaluation by Dr Maudie Mercury reviewed.   EMU hospital records and discussed with patient. non epileptic and epileptic spells were discussed.   New appointment with Dr. Leafy Ro for MWM, she has a follow up appointment. She couldn't pay the consultation fee of 100 USD at the first appointment. She remains in back pain, chronic headache and polypharmacy. Should we try trigger points or nerve blocks? Dr Jaynee Eagles for second opinion.     Follow up in 3 months with NP  Larey Seat, MD  [facility] [address] [place], [place] [address] [phone]

## 2017-12-30 NOTE — Patient Instructions (Signed)

## 2018-01-01 ENCOUNTER — Other Ambulatory Visit
Admission: RE | Admit: 2018-01-01 | Discharge: 2018-01-01 | Disposition: A | Discharge status: Home / Self Care | Payer: BC Managed Care – PPO | Source: Ambulatory Visit | Attending: Student | Admitting: Student

## 2018-01-01 DIAGNOSIS — K2970 Gastritis, unspecified, without bleeding: Secondary | ICD-10-CM | POA: Diagnosis present

## 2018-01-03 LAB — H. PYLORI ANTIGEN, STOOL: H. Pylori Stool Ag, Eia: POSITIVE — AB

## 2018-01-06 ENCOUNTER — Encounter: Payer: Self-pay | Admitting: Student in an Organized Health Care Education/Training Program

## 2018-01-06 ENCOUNTER — Ambulatory Visit (HOSPITAL_BASED_OUTPATIENT_CLINIC_OR_DEPARTMENT_OTHER): Payer: BC Managed Care – PPO | Admitting: Student in an Organized Health Care Education/Training Program

## 2018-01-06 ENCOUNTER — Other Ambulatory Visit: Payer: Self-pay

## 2018-01-06 ENCOUNTER — Ambulatory Visit
Admission: RE | Admit: 2018-01-06 | Discharge: 2018-01-06 | Disposition: A | Discharge status: Home / Self Care | Payer: BC Managed Care – PPO | Source: Ambulatory Visit | Attending: Student in an Organized Health Care Education/Training Program | Admitting: Student in an Organized Health Care Education/Training Program

## 2018-01-06 VITALS — BP 137/73 | HR 59 | Temp 96.5°F | Resp 12 | Ht 62.5 in | Wt 270.0 lb

## 2018-01-06 DIAGNOSIS — M47816 Spondylosis without myelopathy or radiculopathy, lumbar region: Secondary | ICD-10-CM

## 2018-01-06 DIAGNOSIS — M545 Low back pain: Secondary | ICD-10-CM | POA: Diagnosis present

## 2018-01-06 DIAGNOSIS — M4686 Other specified inflammatory spondylopathies, lumbar region: Secondary | ICD-10-CM | POA: Diagnosis not present

## 2018-01-06 DIAGNOSIS — M546 Pain in thoracic spine: Secondary | ICD-10-CM | POA: Diagnosis present

## 2018-01-06 MED ORDER — FENTANYL CITRATE (PF) 100 MCG/2ML IJ SOLN
25.0000 ug | INTRAMUSCULAR | Status: DC | PRN
  Administered 2018-01-06: 100 ug via INTRAVENOUS

## 2018-01-06 MED ORDER — LIDOCAINE HCL (PF) 1 % IJ SOLN
INTRAMUSCULAR | Status: AC
  Filled 2018-01-06: qty 5

## 2018-01-06 MED ORDER — LACTATED RINGERS IV SOLN
1000.0000 mL | Freq: Once | INTRAVENOUS | Status: AC
  Administered 2018-01-06: 1000 mL via INTRAVENOUS

## 2018-01-06 MED ORDER — DEXAMETHASONE SODIUM PHOSPHATE 10 MG/ML IJ SOLN
10.0000 mg | Freq: Once | INTRAMUSCULAR | Status: AC
  Administered 2018-01-06: 10 mg

## 2018-01-06 MED ORDER — ROPIVACAINE HCL 2 MG/ML IJ SOLN
INTRAMUSCULAR | Status: AC
  Filled 2018-01-06: qty 10

## 2018-01-06 MED ORDER — DEXAMETHASONE SODIUM PHOSPHATE 10 MG/ML IJ SOLN
INTRAMUSCULAR | Status: AC
  Filled 2018-01-06: qty 1

## 2018-01-06 MED ORDER — FENTANYL CITRATE (PF) 100 MCG/2ML IJ SOLN
INTRAMUSCULAR | Status: AC
Start: 2018-01-06 — End: 2018-01-06
  Filled 2018-01-06: qty 2

## 2018-01-06 MED ORDER — ROPIVACAINE HCL 2 MG/ML IJ SOLN
10.0000 mL | Freq: Once | INTRAMUSCULAR | Status: AC
  Administered 2018-01-06: 10 mL

## 2018-01-06 MED ORDER — LIDOCAINE HCL 1 % IJ SOLN
10.0000 mL | Freq: Once | INTRAMUSCULAR | Status: AC
  Administered 2018-01-06: 5 mL
  Filled 2018-01-06: qty 10

## 2018-01-06 NOTE — Patient Instructions (Signed)

## 2018-01-06 NOTE — Progress Notes (Signed)
Safety precautions to be maintained throughout the outpatient stay will include: orient to surroundings, keep bed in low position, maintain call bell within reach at all times, provide assistance with transfer out of bed and ambulation.  

## 2018-01-06 NOTE — Progress Notes (Signed)
Patient's Name: [name] [name]  MRN: [id number]  Referring Provider: [name], MD  DOB: 1966/02/27  PCP: [name], MD  DOS: 01/06/2018  Note by: [name], MD  Service setting: Ambulatory outpatient  Specialty: Interventional Pain Management  Patient type: Established  Location: [facility] (AMB) Pain Management Facility  Visit type: Interventional Procedure   Primary Reason for Visit: Interventional Pain Management Treatment. CC: Back Pain (mid and lower)  Procedure:       Anesthesia, Analgesia, Anxiolysis:  Type: Lumbar Facet, Medial Branch Block(s) #2  Primary Purpose: Diagnostic Region: Posterolateral Lumbosacral Spine Level: L3, L4, L5,  Medial Branch Level(s). Injecting these levels blocks the  L4-5, and L5-S1 lumbar facet joints. Laterality: Bilateral  Type: Moderate (Conscious) Sedation combined with Local Anesthesia Indication(s): Analgesia and Anxiety Route: Intravenous (IV) IV Access: Secured Sedation: Meaningful verbal contact was maintained at all times during the procedure  Local Anesthetic: Lidocaine 1%   Indications: 1. Lumbar spondylosis   2. Lumbar facet arthropathy    Pain Score: Pre-procedure: 8 /10 Post-procedure: 0-No pain/10  Pre-op Assessment:  [name] [name] is a 52 y.o. (year old), female patient, seen today for interventional treatment. She  has a past surgical history that includes Cesarean section (1989); Esophagogastroduodenoscopy (N/A, 11/21/2012); Colonoscopy (N/A, 11/21/2012); Incisional hernia repair (N/A, 12/17/2012); Insertion of mesh (N/A, 12/17/2012); Cardiac catheterization (N/A, 01/21/2015); Appendectomy (1990s); Hernia repair; Dilation and curettage of uterus (1985); Ovarian cyst removal (2007; 2014); Abdominal hysterectomy (2005); Colonoscopy with propofol (N/A, 09/02/2017); and Esophagogastroduodenoscopy (egd) with propofol (N/A, 09/02/2017). [name] [name] has a current medication list which includes the following prescription(s): albuterol, albuterol,  cetirizine, clonazepam, dicyclomine, estradiol, fluoxetine, fremanezumab-vfrm, gabapentin, lamotrigine, metoprolol tartrate, omeprazole, ondansetron, oxycodone, ranitidine, sumatriptan, tizanidine, topiramate, trazodone, and vitamin d (ergocalciferol), and the following Facility-Administered Medications: fentanyl and fremanezumab-vfrm. Her primarily concern today is the Back Pain (mid and lower)  Initial Vital Signs:  Pulse Rate: (!) 59 Temp: 98.2 F (36.8 C) Resp: 16 BP: 126/81 SpO2: 100 %  BMI: Estimated body mass index is 48.6 kg/m as calculated from the following:   Height as of this encounter: 5' 2.5" (1.588 m).   Weight as of this encounter: 270 lb (122.5 kg).  Risk Assessment: Allergies: Reviewed. She is allergic to amoxicillin; penicillins; biaxin [clarithromycin]; erythromycin base; and zithromax [azithromycin].  Allergy Precautions: None required Coagulopathies: Reviewed. None identified.  Blood-thinner therapy: None at this time Active Infection(s): Reviewed. None identified. [name] [name] is afebrile  Site Confirmation: [name] [name] was asked to confirm the procedure and laterality before marking the site Procedure checklist: Completed Consent: Before the procedure and under the influence of no sedative(s), amnesic(s), or anxiolytics, the patient was informed of the treatment options, risks and possible complications. To fulfill our ethical and legal obligations, as recommended by the American Medical Association's Code of Ethics, I have informed the patient of my clinical impression; the nature and purpose of the treatment or procedure; the risks, benefits, and possible complications of the intervention; the alternatives, including doing nothing; the risk(s) and benefit(s) of the alternative treatment(s) or procedure(s); and the risk(s) and benefit(s) of doing nothing. The patient was provided information about the general risks and possible complications associated with the  procedure. These may include, but are not limited to: failure to achieve desired goals, infection, bleeding, organ or nerve damage, allergic reactions, paralysis, and death. In addition, the patient was informed of those risks and complications associated to Spine-related procedures, such as failure to decrease pain; infection (i.e.: Meningitis, epidural or intraspinal abscess);  bleeding (i.e.: epidural hematoma, subarachnoid hemorrhage, or any other type of intraspinal or peri-dural bleeding); organ or nerve damage (i.e.: Any type of peripheral nerve, nerve root, or spinal cord injury) with subsequent damage to sensory, motor, and/or autonomic systems, resulting in permanent pain, numbness, and/or weakness of one or several areas of the body; allergic reactions; (i.e.: anaphylactic reaction); and/or death. Furthermore, the patient was informed of those risks and complications associated with the medications. These include, but are not limited to: allergic reactions (i.e.: anaphylactic or anaphylactoid reaction(s)); adrenal axis suppression; blood sugar elevation that in diabetics may result in ketoacidosis or comma; water retention that in patients with history of congestive heart failure may result in shortness of breath, pulmonary edema, and decompensation with resultant heart failure; weight gain; swelling or edema; medication-induced neural toxicity; particulate matter embolism and blood vessel occlusion with resultant organ, and/or nervous system infarction; and/or aseptic necrosis of one or more joints. Finally, the patient was informed that Medicine is not an exact science; therefore, there is also the possibility of unforeseen or unpredictable risks and/or possible complications that may result in a catastrophic outcome. The patient indicated having understood very clearly. We have given the patient no guarantees and we have made no promises. Enough time was given to the patient to ask questions, all of  which were answered to the patient's satisfaction. [name] [name] has indicated that she wanted to continue with the procedure. Attestation: I, the ordering provider, attest that I have discussed with the patient the benefits, risks, side-effects, alternatives, likelihood of achieving goals, and potential problems during recovery for the procedure that I have provided informed consent. Date  Time:   Pre-Procedure Preparation:  Monitoring: As per clinic protocol. Respiration, ETCO2, SpO2, BP, heart rate and rhythm monitor placed and checked for adequate function Safety Precautions: Patient was assessed for positional comfort and pressure points before starting the procedure. Time-out: I initiated and conducted the "Time-out" before starting the procedure, as per protocol. The patient was asked to participate by confirming the accuracy of the "Time Out" information. Verification of the correct person, site, and procedure were performed and confirmed by me, the nursing staff, and the patient. "Time-out" conducted as per Joint Commission's Universal Protocol (UP.01.01.01). Time: 0930  Description of Procedure:       Position: Prone Laterality: Bilateral. The procedure was performed in identical fashion on both sides. Levels:   L3, L4, L5,  Medial Branch Level(s) Area Prepped: Posterior Lumbosacral Region Prepping solution: ChloraPrep (2% chlorhexidine gluconate and 70% isopropyl alcohol) Safety Precautions: Aspiration looking for blood return was conducted prior to all injections. At no point did we inject any substances, as a needle was being advanced. Before injecting, the patient was told to immediately notify me if she was experiencing any new onset of "ringing in the ears, or metallic taste in the mouth". No attempts were made at seeking any paresthesias. Safe injection practices and needle disposal techniques used. Medications properly checked for expiration dates. SDV (single dose vial) medications  used. After the completion of the procedure, all disposable equipment used was discarded in the proper designated medical waste containers. Local Anesthesia: Protocol guidelines were followed. The patient was positioned over the fluoroscopy table. The area was prepped in the usual manner. The time-out was completed. The target area was identified using fluoroscopy. A 12-in long, straight, sterile hemostat was used with fluoroscopic guidance to locate the targets for each level blocked. Once located, the skin was marked with an approved surgical skin marker.  Once all sites were marked, the skin (epidermis, dermis, and hypodermis), as well as deeper tissues (fat, connective tissue and muscle) were infiltrated with a small amount of a short-acting local anesthetic, loaded on a 10cc syringe with a 25G, 1.5-in  Needle. An appropriate amount of time was allowed for local anesthetics to take effect before proceeding to the next step. Local Anesthetic: Lidocaine 1.0% The unused portion of the local anesthetic was discarded in the proper designated containers. Technical explanation of process:   L3 Medial Branch Nerve Block (MBB): The target area for the L3 medial branch is at the junction of the postero-lateral aspect of the superior articular process and the superior, posterior, and medial edge of the transverse process of L4. Under fluoroscopic guidance, a Quincke needle was inserted until contact was made with os over the superior postero-lateral aspect of the pedicular shadow (target area). After negative aspiration for blood, 1 mL of the nerve block solution was injected without difficulty or complication. The needle was removed intact. L4 Medial Branch Nerve Block (MBB): The target area for the L4 medial branch is at the junction of the postero-lateral aspect of the superior articular process and the superior, posterior, and medial edge of the transverse process of L5. Under fluoroscopic guidance, a Quincke  needle was inserted until contact was made with os over the superior postero-lateral aspect of the pedicular shadow (target area). After negative aspiration for blood, 66mL of the nerve block solution was injected without difficulty or complication. The needle was removed intact. L5 Medial Branch Nerve Block (MBB): The target area for the L5 medial branch is at the junction of the postero-lateral aspect of the superior articular process and the superior, posterior, and medial edge of the sacral ala. Under fluoroscopic guidance, a Quincke needle was inserted until contact was made with os over the superior postero-lateral aspect of the pedicular shadow (target area). After negative aspiration for blood,54mL of the nerve block solution was injected without difficulty or complication. The needle was removed intact.  Procedural Needles: 22-gauge, 3.5-inch, Quincke needles used for all levels. Nerve block solution: 10 cc solution made of 9 cc of 0.2% ropivacaine, 1 cc of Decadron (10 mg/cc). 1-1.5 cc injected at each level.  The unused portion of the solution was discarded in the proper designated containers.  Once the entire procedure was completed, the treated area was cleaned, making sure to leave some of the prepping solution back to take advantage of its long term bactericidal properties.   Illustration of the posterior view of the lumbar spine and the posterior neural structures. Laminae of L2 through S1 are labeled. DPRL5, dorsal primary ramus of L5; DPRS1, dorsal primary ramus of S1; DPR3, dorsal primary ramus of L3; FJ, facet (zygapophyseal) joint L3-L4; I, inferior articular process of L4; LB1, lateral branch of dorsal primary ramus of L1; IAB, inferior articular branches from L3 medial branch (supplies L4-L5 facet joint); IBP, intermediate branch plexus; MB3, medial branch of dorsal primary ramus of L3; NR3, third lumbar nerve root; S, superior articular process of L5; SAB, superior articular branches  from L4 (supplies L4-5 facet joint also); TP3, transverse process of L3.  Vitals:   01/06/18 1005 01/06/18 1015 01/06/18 1025 01/06/18 1035  BP: 131/63 135/74 137/77 137/73  Pulse:      Resp: 12 14 17 12   Temp: (!) 97.2 F (36.2 C)   (!) 96.5 F (35.8 C)  TempSrc:      SpO2: 98% 98% 98% 98%  Weight:  Height:        Start Time: 0932 hrs. End Time: 0955 hrs.  Imaging Guidance (Spinal):  Type of Imaging Technique: Fluoroscopy Guidance (Spinal) Indication(s): Assistance in needle guidance and placement for procedures requiring needle placement in or near specific anatomical locations not easily accessible without such assistance. Exposure Time: Please see nurses notes. Contrast: None used. Fluoroscopic Guidance: I was personally present during the use of fluoroscopy. "Tunnel Vision Technique" used to obtain the best possible view of the target area. Parallax error corrected before commencing the procedure. "Direction-depth-direction" technique used to introduce the needle under continuous pulsed fluoroscopy. Once target was reached, antero-posterior, oblique, and lateral fluoroscopic projection used confirm needle placement in all planes. Images permanently stored in EMR. Interpretation: No contrast injected. I personally interpreted the imaging intraoperatively. Adequate needle placement confirmed in multiple planes. Permanent images saved into the patient's record.  Antibiotic Prophylaxis:   Anti-infectives (From admission, onward)   None     Indication(s): None identified  Post-operative Assessment:  Post-procedure Vital Signs:  Pulse Rate: (!) 59 Temp: (!) 96.5 F (35.8 C) Resp: 12 BP: 137/73 SpO2: 98 %  EBL: None  Complications: No immediate post-treatment complications observed by team, or reported by patient.  Note: The patient tolerated the entire procedure well. A repeat set of vitals were taken after the procedure and the patient was kept under observation  following institutional policy, for this type of procedure. Post-procedural neurological assessment was performed, showing return to baseline, prior to discharge. The patient was provided with post-procedure discharge instructions, including a section on how to identify potential problems. Should any problems arise concerning this procedure, the patient was given instructions to immediately contact us, at any time, without hesitation. In any case, we plan to contact the patient by telephone for a follow-up status report regarding this interventional procedure.  Comments:  No additional relevant information. 5 out of 5 strength bilateral lower extremity: Plantar flexion, dorsiflexion, knee flexion, knee extension.  Plan of Care    Imaging Orders     DG C-Arm 1-60 Min-No Report Procedure Orders    No procedure(s) ordered today    Medications ordered for procedure: Meds ordered this encounter  Medications  . lactated ringers infusion 1,000 mL  . fentaNYL (SUBLIMAZE) injection 25-100 mcg    Make sure Narcan is available in the pyxis when using this medication. In the event of respiratory depression (RR< 8/min): Titrate NARCAN (naloxone) in increments of 0.1 to 0.2 mg IV at 2-3 minute intervals, until desired degree of reversal.  . lidocaine (XYLOCAINE) 1 % (with pres) injection 10 mL  . ropivacaine (PF) 2 mg/mL (0.2%) (NAROPIN) injection 10 mL  . dexamethasone (DECADRON) injection 10 mg   Medications administered: We administered lactated ringers, fentaNYL, lidocaine, ropivacaine (PF) 2 mg/mL (0.2%), and dexamethasone.  See the medical record for exact dosing, route, and time of administration.  New Prescriptions   No medications on file   Disposition: Discharge home  Discharge Date & Time: 01/06/2018; 1035 hrs.   Physician-requested Follow-up: Return in about 1 month (around 02/03/2018) for Post Procedure Evaluation.  Future Appointments  Date Time Provider [place]    01/21/2018  8:30 AM Daron Offer, Richard Miu, MD [facility] None  01/27/2018  9:00 AM Archie Balboa, LCAS-A [facility] None  02/03/2018 12:00 PM [name], MD [facility] None  04/24/2018  9:30 AM Ward Givens, NP [facility] None   Primary Care Physician: [name], MD Location: [facility] Note by: [name],  MD Date: 01/06/2018; Time: 1:11 PM  Disclaimer:  Medicine is not an Chief Strategy Officer. The only guarantee in medicine is that nothing is guaranteed. It is important to note that the decision to proceed with this intervention was based on the information collected from the patient. The Data and conclusions were drawn from the patient's questionnaire, the interview, and the physical examination. Because the information was provided in large part by the patient, it cannot be guaranteed that it has not been purposely or unconsciously manipulated. Every effort has been made to obtain as much relevant data as possible for this evaluation. It is important to note that the conclusions that lead to this procedure are derived in large part from the available data. Always take into account that the treatment will also be dependent on availability of resources and existing treatment guidelines, considered by other Pain Management Practitioners as being common knowledge and practice, at the time of the intervention. For Medico-Legal purposes, it is also important to point out that variation in procedural techniques and pharmacological choices are the acceptable norm. The indications, contraindications, technique, and results of the above procedure should only be interpreted and judged by a Board-Certified Interventional Pain Specialist with extensive familiarity and expertise in the same exact procedure and technique.

## 2018-01-07 ENCOUNTER — Telehealth: Payer: Self-pay

## 2018-01-07 NOTE — Telephone Encounter (Signed)
Post procedure phone call.  Patient states she is hanging in there.  Instructed to call for any questions or concerns.

## 2018-01-21 ENCOUNTER — Encounter (HOSPITAL_COMMUNITY): Payer: Self-pay | Admitting: Psychiatry

## 2018-01-21 ENCOUNTER — Ambulatory Visit (INDEPENDENT_AMBULATORY_CARE_PROVIDER_SITE_OTHER): Payer: BC Managed Care – PPO | Admitting: Psychiatry

## 2018-01-21 DIAGNOSIS — F4312 Post-traumatic stress disorder, chronic: Secondary | ICD-10-CM

## 2018-01-21 DIAGNOSIS — Z818 Family history of other mental and behavioral disorders: Secondary | ICD-10-CM | POA: Diagnosis not present

## 2018-01-21 DIAGNOSIS — Z79899 Other long term (current) drug therapy: Secondary | ICD-10-CM

## 2018-01-21 DIAGNOSIS — G8929 Other chronic pain: Secondary | ICD-10-CM | POA: Diagnosis not present

## 2018-01-21 DIAGNOSIS — S069X1S Unspecified intracranial injury with loss of consciousness of 30 minutes or less, sequela: Secondary | ICD-10-CM

## 2018-01-21 MED ORDER — FLUOXETINE HCL 40 MG PO CAPS: 40.0000 mg | ORAL_CAPSULE | Freq: Every day | ORAL | 1 refills | Status: AC

## 2018-01-21 MED ORDER — TRAZODONE HCL 50 MG PO TABS: ORAL_TABLET | ORAL | 1 refills | Status: DC
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
## 2018-01-27 ENCOUNTER — Ambulatory Visit (INDEPENDENT_AMBULATORY_CARE_PROVIDER_SITE_OTHER): Payer: BC Managed Care – PPO | Admitting: Licensed Clinical Social Worker

## 2018-01-27 DIAGNOSIS — F4312 Post-traumatic stress disorder, chronic: Secondary | ICD-10-CM

## 2018-01-29 DIAGNOSIS — Z0289 Encounter for other administrative examinations: Secondary | ICD-10-CM

## 2018-02-03 ENCOUNTER — Encounter: Payer: Self-pay | Admitting: Student in an Organized Health Care Education/Training Program

## 2018-02-03 ENCOUNTER — Ambulatory Visit
Payer: BC Managed Care – PPO | Attending: Student in an Organized Health Care Education/Training Program | Admitting: Student in an Organized Health Care Education/Training Program

## 2018-02-03 VITALS — BP 128/68 | HR 50 | Temp 98.4°F | Resp 16 | Ht 62.0 in | Wt 276.0 lb

## 2018-02-03 DIAGNOSIS — G894 Chronic pain syndrome: Secondary | ICD-10-CM | POA: Diagnosis present

## 2018-02-03 DIAGNOSIS — Z6841 Body Mass Index (BMI) 40.0 and over, adult: Secondary | ICD-10-CM | POA: Diagnosis not present

## 2018-02-03 DIAGNOSIS — I4891 Unspecified atrial fibrillation: Secondary | ICD-10-CM | POA: Diagnosis not present

## 2018-02-03 DIAGNOSIS — Z8673 Personal history of transient ischemic attack (TIA), and cerebral infarction without residual deficits: Secondary | ICD-10-CM | POA: Insufficient documentation

## 2018-02-03 DIAGNOSIS — Z90710 Acquired absence of both cervix and uterus: Secondary | ICD-10-CM | POA: Diagnosis not present

## 2018-02-03 DIAGNOSIS — M47818 Spondylosis without myelopathy or radiculopathy, sacral and sacrococcygeal region: Secondary | ICD-10-CM | POA: Diagnosis not present

## 2018-02-03 DIAGNOSIS — Z880 Allergy status to penicillin: Secondary | ICD-10-CM | POA: Diagnosis not present

## 2018-02-03 DIAGNOSIS — M7062 Trochanteric bursitis, left hip: Secondary | ICD-10-CM | POA: Insufficient documentation

## 2018-02-03 DIAGNOSIS — Z881 Allergy status to other antibiotic agents status: Secondary | ICD-10-CM | POA: Diagnosis not present

## 2018-02-03 DIAGNOSIS — Z79899 Other long term (current) drug therapy: Secondary | ICD-10-CM | POA: Insufficient documentation

## 2018-02-03 DIAGNOSIS — K219 Gastro-esophageal reflux disease without esophagitis: Secondary | ICD-10-CM | POA: Diagnosis not present

## 2018-02-03 DIAGNOSIS — G8929 Other chronic pain: Secondary | ICD-10-CM

## 2018-02-03 DIAGNOSIS — E559 Vitamin D deficiency, unspecified: Secondary | ICD-10-CM | POA: Insufficient documentation

## 2018-02-03 DIAGNOSIS — M533 Sacrococcygeal disorders, not elsewhere classified: Secondary | ICD-10-CM

## 2018-02-03 MED ORDER — OXYCODONE HCL 5 MG PO TABS: 5.0000 mg | ORAL_TABLET | Freq: Every day | ORAL | 0 refills | Status: DC | PRN

## 2018-02-03 NOTE — Progress Notes (Signed)
Safety precautions to be maintained throughout the outpatient stay will include: orient to surroundings, keep bed in low position, maintain call bell within reach at all times, provide assistance with transfer out of bed and ambulation.  

## 2018-02-03 NOTE — Progress Notes (Signed)
Patient's Name: [name] [name]  MRN: [id number]  Referring Provider: [name], MD  DOB: 1965/11/06  PCP: [name] Pink, MD  DOS: 02/03/2018  Note by: [name] Santa, MD  Service setting: Ambulatory outpatient  Specialty: Interventional Pain Management  Location: [facility] (AMB) Pain Management Facility    Patient type: Established   Primary Reason(s) for Visit: Encounter for post-procedure evaluation of chronic illness with mild to moderate exacerbation CC: Back Pain (mid and lower lumbar)  HPI  [name] [name] is a 52 y.o. year old, female patient, who comes today for a post-procedure evaluation. She has Incisional hernia, without obstruction or gangrene; GERD (gastroesophageal reflux disease); Obesity, Class III, BMI 40-49.9 (morbid obesity) ([place]); Generalized convulsive epilepsy ([place]); History of recent fall; Paresthesia; CVA (cerebral infarction); Right sided weakness; Seizure disorder ([place]); Chronic headaches; Atrial fibrillation ([place]); Right arm weakness; Asthma, mild intermittent; TIA (transient ischemic attack); Morbid obesity ([place]); Intractable migraine with aura with status migrainosus; Chronic post-traumatic headache, not intractable; Migraine with aura and with status migrainosus, not intractable; Subarachnoid hemorrhage following injury, with loss of consciousness ([place]); TBI (traumatic brain injury) ([place]); Partial symptomatic epilepsy with complex partial seizures, not intractable, without status epilepticus ([place]); Headache; DDD (degenerative disc disease), lumbosacral; Lumbosacral radiculopathy; Surgical menopause on hormone replacement therapy; Incontinence in female; Status post TAH-BSO; Prediabetes; Vitamin D deficiency; Agitation; Pseudoseizure; Seizure-like activity ([place]); Seizures ([place]); Other parasomnia; Intractable migraine without aura and without status migrainosus; Nocturnal seizures ([place]); Anxiety; Asthma; Cerebral artery occlusion with cerebral infarction ([place]); Generalized muscle  weakness; History of CVA (cerebrovascular accident) without residual deficits; Other symptoms and signs involving the musculoskeletal system; Skin sensation disturbance; Migraine with aura; Migraines; Convulsions ([place]); Class 3 obesity in adult; Transient cerebral ischemia; Lumbar spondylosis; Chronic pain syndrome; Chronic upper back pain; Chest pain; Atypical chest pain; T wave inversion in EKG; Class 3 severe obesity due to excess calories with serious comorbidity and body mass index (BMI) of 45.0 to 49.9 in adult [facility]); Moderate persistent asthma; and Chronic migraine with aura on their problem list. Her primarily concern today is the Back Pain (mid and lower lumbar)  Pain Assessment: Location: Left, Right, Mid, Lower Back Radiating: sometimes pain/soreness moves down sides and fronts of both thighs, then feeling of numbness to feet including all toes.  no improvement following procedure.  Onset: More than a month ago Duration: Chronic pain Quality: Discomfort, Constant(at times excruciating) Severity: 7 /10 (subjective, self-reported pain score)  Note: Reported level is inconsistent with clinical observations.                         When using our objective Pain Scale, levels between 6 and 10/10 are said to belong in an emergency room, as it progressively worsens from a 6/10, described as severely limiting, requiring emergency care not usually available at an outpatient pain management facility. At a 6/10 level, communication becomes difficult and requires great effort. Assistance to reach the emergency department may be required. Facial flushing and profuse sweating along with potentially dangerous increases in heart rate and blood pressure will be evident. Effect on ADL: feels like muscles in back get into knots.  Timing: Constant Modifying factors: nothing  BP: 128/68  HR: (!) 50  [name] [name] comes in today for post-procedure evaluation after the treatment done on 01/07/2018.  Further  details on both, my assessment(s), as well as the proposed treatment plan, please see below.  Post-Procedure Assessment  01/06/2018 Procedure: Bilateral L3, L4, L5 facet medial branch nerve  block #2 Pre-procedure pain score:  8/10 Post-procedure pain score: 0/10         Influential Factors: BMI: 50.48 kg/m Intra-procedural challenges: None observed.         Assessment challenges: None detected.              Reported side-effects: None.        Post-procedural adverse reactions or complications: None reported         Sedation: Please see nurses note. When no sedatives are used, the analgesic levels obtained are directly associated to the effectiveness of the local anesthetics. However, when sedation is provided, the level of analgesia obtained during the initial 1 hour following the intervention, is believed to be the result of a combination of factors. These factors may include, but are not limited to: 1. The effectiveness of the local anesthetics used. 2. The effects of the analgesic(s) and/or anxiolytic(s) used. 3. The degree of discomfort experienced by the patient at the time of the procedure. 4. The patients ability and reliability in recalling and recording the events. 5. The presence and influence of possible secondary gains and/or psychosocial factors. Reported result: Relief experienced during the 1st hour after the procedure: 20 % (Ultra-Short Term Relief)            Interpretative annotation: Clinically appropriate result. Analgesia during this period is likely to be Local Anesthetic and/or IV Sedative (Analgesic/Anxiolytic) related.          Effects of local anesthetic: The analgesic effects attained during this period are directly associated to the localized infiltration of local anesthetics and therefore cary significant diagnostic value as to the etiological location, or anatomical origin, of the pain. Expected duration of relief is directly dependent on the pharmacodynamics of  the local anesthetic used. Long-acting (4-6 hours) anesthetics used.  Reported result: Relief during the next 4 to 6 hour after the procedure: 20 % (Short-Term Relief)            Interpretative annotation: Clinically appropriate result. Analgesia during this period is likely to be Local Anesthetic-related.          Long-term benefit: Defined as the period of time past the expected duration of local anesthetics (1 hour for short-acting and 4-6 hours for long-acting). With the possible exception of prolonged sympathetic blockade from the local anesthetics, benefits during this period are typically attributed to, or associated with, other factors such as analgesic sensory neuropraxia, antiinflammatory effects, or beneficial biochemical changes provided by agents other than the local anesthetics.  Reported result: Extended relief following procedure: 0 % (Long-Term Relief)            Interpretative annotation: Unexpected result. No benefit. Therapeutic failure. Pain appears to be refractory to this treatment modality.          Current benefits: Defined as reported results that persistent at this point in time.   Analgesia: 0 %            Function: No benefit ROM: No benefit Interpretative annotation: Recurrence of symptoms. Therapeutic failure. Results would argue against repeating therapy.          Interpretation: Results would suggest failure of therapy in achieving desired goal(s).                  Plan:  Please see "Plan of Care" for details.                Laboratory Chemistry  Inflammation Markers (CRP: Acute Phase) (ESR: Chronic Phase)  Lab Results  Component Value Date   LATICACIDVEN 0.54 09/05/2014                         Rheumatology Markers No results found for: RF, ANA, LABURIC, URICUR, LYMEIGGIGMAB, LYMEABIGMQN, HLAB27                      Renal Function Markers Lab Results  Component Value Date   BUN 16 11/20/2017   CREATININE 0.87 11/20/2017   BCR 14 11/13/2017   GFRAA  >60 11/20/2017   GFRNONAA >60 11/20/2017                             Hepatic Function Markers Lab Results  Component Value Date   AST 16 11/20/2017   ALT 15 11/20/2017   ALBUMIN 3.4 (L) 11/20/2017   ALKPHOS 93 11/20/2017   AMYLASE 54 11/18/2012   LIPASE 11 11/23/2016                        Electrolytes Lab Results  Component Value Date   NA 140 11/20/2017   K 3.6 11/20/2017   CL 110 11/20/2017   CALCIUM 8.6 (L) 11/20/2017   MG 1.8 09/26/2014                        Neuropathy Markers Lab Results  Component Value Date   VITAMINB12 491 09/10/2016   FOLATE >20.0 09/10/2016   HGBA1C 5.5 10/09/2016   HIV Non Reactive 11/19/2017                        Bone Pathology Markers Lab Results  Component Value Date   VD25OH 23.5 (L) 10/09/2016   VD125OH2TOT 47 03/09/2015   [id number] 24 03/09/2015   [id number] 23 03/09/2015                         Coagulation Parameters Lab Results  Component Value Date   INR 1.04 02/05/2016   LABPROT 13.8 02/05/2016   APTT 35 02/05/2016   PLT 190 11/20/2017   DDIMER 1.20 (H) 11/19/2017                        Cardiovascular Markers Lab Results  Component Value Date   BNP 30.9 10/01/2014   CKTOTAL 101 05/29/2007   CKMB 1.2 05/29/2007   TROPONINI <0.03 11/20/2017   HGB 12.7 11/20/2017   HCT 38.7 11/20/2017                         CA Markers No results found for: CEA, CA125, LABCA2                      Note: Lab results reviewed.  Recent Diagnostic Imaging Results  DG C-Arm 1-60 Min-No Report Fluoroscopy was utilized by the requesting physician.  No radiographic  interpretation.   Complexity Note: Imaging results reviewed. Results shared with Ms. Navis, using Layman's terms.                         Meds   Current Outpatient Medications:  .  albuterol (PROVENTIL HFA;VENTOLIN HFA) 108 (90 BASE) MCG/ACT inhaler, Inhale 2 puffs into the lungs every 6 (six) hours  as needed for wheezing or shortness of breath. , Disp: , Rfl:  .   albuterol (PROVENTIL) (2.5 MG/3ML) 0.083% nebulizer solution, Take 2.5 mg by nebulization every 6 (six) hours as needed for wheezing or shortness of breath., Disp: , Rfl:  .  cetirizine (ZYRTEC) 10 MG tablet, Take 10 mg by mouth daily as needed., Disp: , Rfl: 1 .  clonazePAM (KLONOPIN) 1 MG tablet, Take one at night prn , use after facette injections., Disp: 90 tablet, Rfl: 0 .  dicyclomine (BENTYL) 20 MG tablet, Take 1 tablet by mouth every 6 (six) hours as needed., Disp: , Rfl:  .  estradiol (ESTRACE) 1 MG tablet, TAKE 1 TABLET BY MOUTH EVERY DAY (Patient taking differently: TAKE 47m  TABLET BY MOUTH EVERY DAY), Disp: 90 tablet, Rfl: 2 .  FLUoxetine (PROZAC) 40 MG capsule, Take 1 capsule (40 mg total) by mouth daily., Disp: 90 capsule, Rfl: 1 .  Fremanezumab-vfrm (AJOVY) 225 MG/1.5ML SOSY, Inject 225 mg every 30 (thirty) days into the skin., Disp: 1 Syringe, Rfl: 11 .  gabapentin (NEURONTIN) 300 MG capsule, Take 1 capsule (300 mg total) at bedtime by mouth., Disp: 90 capsule, Rfl: 3 .  lamoTRIgine (LAMICTAL) 150 MG tablet, Take 1 tablet (150 mg total) 2 (two) times daily by mouth., Disp: 180 tablet, Rfl: 3 .  metoprolol tartrate (LOPRESSOR) 25 MG tablet, Take 1 tablet (25 mg total) by mouth 2 (two) times daily. (Patient taking differently: Take 25 mg by mouth daily. ), Disp: 60 tablet, Rfl: 0 .  omeprazole (PRILOSEC) 40 MG capsule, Take 40 mg by mouth daily., Disp: , Rfl:  .  ondansetron (ZOFRAN) 4 MG tablet, Take 1 tablet (4 mg total) by mouth every 8 (eight) hours as needed for nausea or vomiting., Disp: 10 tablet, Rfl: 0 .  oxyCODONE (OXY IR/ROXICODONE) 5 MG immediate release tablet, Take 1 tablet (5 mg total) by mouth daily as needed for severe pain., Disp: 30 tablet, Rfl: 0 .  ranitidine (ZANTAC) 300 MG tablet, Take 300 mg by mouth daily as needed for heartburn. , Disp: , Rfl:  .  SUMAtriptan (IMITREX) 50 MG tablet, Take 1 tablet (50 mg total) by mouth every 2 (two) hours as needed for  migraine. May repeat in 2 hours if headache persists or recurs., Disp: 30 tablet, Rfl: 1 .  tiZANidine (ZANAFLEX) 4 MG tablet, Take 1 tablet (4 mg total) by mouth every 8 (eight) hours as needed for muscle spasms., Disp: 90 tablet, Rfl: 1 .  topiramate (TOPAMAX) 100 MG tablet, Take 1 tablet (100 mg total) 2 (two) times daily by mouth., Disp: 180 tablet, Rfl: 3 .  traZODone (DESYREL) 50 MG tablet, Take 1-2 tablets at night for sleep, Disp: 180 tablet, Rfl: 1 .  Vitamin D, Ergocalciferol, (DRISDOL) 50000 units CAPS capsule, Take 1 capsule (50,000 Units total) by mouth every Tuesday., Disp: 30 capsule, Rfl: 0  Current Facility-Administered Medications:  .  Fremanezumab-vfrm SOSY 1.5 mL, 1.5 mL, Subcutaneous, Q30 days, Dohmeier, CAsencion Partridge MD  ROS  Constitutional: Denies any fever or chills Gastrointestinal: No reported hemesis, hematochezia, vomiting, or acute GI distress Musculoskeletal: Denies any acute onset joint swelling, redness, loss of ROM, or weakness Neurological: No reported episodes of acute onset apraxia, aphasia, dysarthria, agnosia, amnesia, paralysis, loss of coordination, or loss of consciousness  Allergies  Ms. Elks is allergic to amoxicillin; penicillins; biaxin [clarithromycin]; erythromycin base; and zithromax [azithromycin].  PFSH  Drug: Ms. HGrandmaison reports that she does not use drugs. Alcohol:  reports  that she does not drink alcohol. Tobacco:  reports that she has never smoked. She has never used smokeless tobacco. Medical:  has a past medical history of Anginal pain ([place]), Anxiety, Asthma, Atrial fibrillation ([place]), Chest pain, Chronic back pain, Chronic bronchitis ([place]), Complication of anesthesia, Daily headache, Epilepsy ([place]), Fibroid, GERD (gastroesophageal reflux disease), Head injury (1975; 2015), Joint pain, Migraine, Obesity, Class III, BMI 40-49.9 (morbid obesity) ([place]) (12/21/2012), Ovarian cyst, Pre-diabetes, Seizures ([place]), Shortness of breath dyspnea, TBI  (traumatic brain injury) ([place]) (2015), TIA (transient ischemic attack) (04/2015?), and Vitamin D deficiency. Surgical: Ms. Bonzo  has a past surgical history that includes Cesarean section (1989); Esophagogastroduodenoscopy (N/A, 11/21/2012); Colonoscopy (N/A, 11/21/2012); Incisional hernia repair (N/A, 12/17/2012); Insertion of mesh (N/A, 12/17/2012); Cardiac catheterization (N/A, 01/21/2015); Appendectomy (1990s); Hernia repair; Dilation and curettage of uterus (1985); Ovarian cyst removal (2007; 2014); Abdominal hysterectomy (2005); Colonoscopy with propofol (N/A, 09/02/2017); and Esophagogastroduodenoscopy (egd) with propofol (N/A, 09/02/2017). Family: family history includes Breast cancer in her cousin; COPD in her father; Depression in her mother; Diabetes in her father; Emphysema in her father; Heart attack in her father; Heart disease in her father and mother; Hypertension in her father and mother.  Constitutional Exam  General appearance: Well nourished, well developed, and well hydrated. In no apparent acute distress Vitals:   02/03/18 1203  BP: 128/68  Pulse: (!) 50  Resp: 16  Temp: 98.4 F (36.9 C)  TempSrc: Oral  SpO2: 99%  Weight: 276 lb (125.2 kg)  Height: '5\' 2"'$  (1.575 m)   BMI Assessment: Estimated body mass index is 50.48 kg/m as calculated from the following:   Height as of this encounter: '5\' 2"'$  (1.575 m).   Weight as of this encounter: 276 lb (125.2 kg).  BMI interpretation table: BMI level Category Range association with higher incidence of chronic pain  <18 kg/m2 Underweight   18.5-24.9 kg/m2 Ideal body weight   25-29.9 kg/m2 Overweight Increased incidence by 20%  30-34.9 kg/m2 Obese (Class I) Increased incidence by 68%  35-39.9 kg/m2 Severe obesity (Class II) Increased incidence by 136%  >40 kg/m2 Extreme obesity (Class III) Increased incidence by 254%   Patient's current BMI Ideal Body weight  Body mass index is 50.48 kg/m. Ideal body weight: 50.1 kg (110 lb 7.2  oz) Adjusted ideal body weight: 80.1 kg (176 lb 10.7 oz)   BMI Readings from Last 4 Encounters:  02/03/18 50.48 kg/m  01/06/18 48.60 kg/m  12/30/17 50.48 kg/m  12/24/17 49.57 kg/m   Wt Readings from Last 4 Encounters:  02/03/18 276 lb (125.2 kg)  01/06/18 270 lb (122.5 kg)  12/30/17 276 lb (125.2 kg)  12/24/17 271 lb (122.9 kg)  Psych/Mental status: Alert, oriented x 3 (person, place, & time)       Eyes: PERLA Respiratory: No evidence of acute respiratory distress  Cervical Spine Area Exam  Skin & Axial Inspection: No masses, redness, edema, swelling, or associated skin lesions Alignment: Symmetrical Functional ROM: Unrestricted ROM      Stability: No instability detected Muscle Tone/Strength: Functionally intact. No obvious neuro-muscular anomalies detected. Sensory (Neurological): Unimpaired Palpation: No palpable anomalies              Upper Extremity (UE) Exam    Side: Right upper extremity  Side: Left upper extremity  Skin & Extremity Inspection: Skin color, temperature, and hair growth are WNL. No peripheral edema or cyanosis. No masses, redness, swelling, asymmetry, or associated skin lesions. No contractures.  Skin & Extremity Inspection: Skin color, temperature, and  hair growth are WNL. No peripheral edema or cyanosis. No masses, redness, swelling, asymmetry, or associated skin lesions. No contractures.  Functional ROM: Unrestricted ROM          Functional ROM: Unrestricted ROM          Muscle Tone/Strength: Functionally intact. No obvious neuro-muscular anomalies detected.  Muscle Tone/Strength: Functionally intact. No obvious neuro-muscular anomalies detected.  Sensory (Neurological): Unimpaired          Sensory (Neurological): Unimpaired          Palpation: No palpable anomalies              Palpation: No palpable anomalies              Provocative Test(s):  Phalen's test: deferred Tinel's test: deferred Apley's scratch test (touch opposite shoulder):  Action 1  (Across chest): deferred Action 2 (Overhead): deferred Action 3 (LB reach): deferred   Provocative Test(s):  Phalen's test: deferred Tinel's test: deferred Apley's scratch test (touch opposite shoulder):  Action 1 (Across chest): deferred Action 2 (Overhead): deferred Action 3 (LB reach): deferred    Thoracic Spine Area Exam  Skin & Axial Inspection: No masses, redness, or swelling Alignment: Symmetrical Functional ROM: Unrestricted ROM Stability: No instability detected Muscle Tone/Strength: Functionally intact. No obvious neuro-muscular anomalies detected. Sensory (Neurological): Unimpaired Muscle strength & Tone: No palpable anomalies  Lumbar Spine Area Exam  Skin & Axial Inspection: No masses, redness, or swelling Alignment: Symmetrical Functional ROM: Unrestricted ROM       Stability: No instability detected Muscle Tone/Strength: Functionally intact. No obvious neuro-muscular anomalies detected. Sensory (Neurological): Musculoskeletal pain pattern Palpation: Complains of area being tender to palpation       Provocative Tests: Lumbar Hyperextension/rotation test: deferred today       Lumbar quadrant test (Kemp's test): deferred today       Lumbar Lateral bending test: deferred today       Patrick's Maneuver: deferred today                   FABER test: deferred today       Thigh-thrust test: Positive for right-sided S-I arthralgia S-I compression test: Positive for left-sided S-I arthralgia S-I distraction test: Positive for left-sided S-I arthralgia Tender to palpation overlying left greater trochanteric bursa Gait & Posture Assessment  Ambulation: Limited Gait: Antalgic gait (limping) Posture: Difficulty standing up straight, due to pain   Lower Extremity Exam    Side: Right lower extremity  Side: Left lower extremity  Stability: No instability observed          Stability: No instability observed          Skin & Extremity Inspection: Skin color, temperature, and  hair growth are WNL. No peripheral edema or cyanosis. No masses, redness, swelling, asymmetry, or associated skin lesions. No contractures.  Skin & Extremity Inspection: Skin color, temperature, and hair growth are WNL. No peripheral edema or cyanosis. No masses, redness, swelling, asymmetry, or associated skin lesions. No contractures.  Functional ROM: Unrestricted ROM                  Functional ROM: Unrestricted ROM                  Muscle Tone/Strength: Functionally intact. No obvious neuro-muscular anomalies detected.  Muscle Tone/Strength: Functionally intact. No obvious neuro-muscular anomalies detected.  Sensory (Neurological): Unimpaired  Sensory (Neurological): Unimpaired  Palpation: No palpable anomalies  Palpation: No palpable anomalies   Assessment  Primary  Diagnosis & Pertinent Problem List: The primary encounter diagnosis was Chronic left SI joint pain. Diagnoses of SI joint arthritis, Trochanteric bursitis of left hip, and Chronic pain syndrome were also pertinent to this visit.  Status Diagnosis  Having a Flare-up Having a Flare-up Having a Flare-up 1. Chronic left SI joint pain   2. SI joint arthritis   3. Trochanteric bursitis of left hip   4. Chronic pain syndrome     General Recommendations: The pain condition that the patient suffers from is best treated with a multidisciplinary approach that involves an increase in physical activity to prevent de-conditioning and worsening of the pain cycle, as well as psychological counseling (formal and/or informal) to address the co-morbid psychological affects of pain. Treatment will often involve judicious use of pain medications and interventional procedures to decrease the pain, allowing the patient to participate in the physical activity that will ultimately produce long-lasting pain reductions. The goal of the multidisciplinary approach is to return the patient to a higher level of overall function and to restore their ability to  perform activities of daily living.   52 year old female with a history of axial low back pain that radiates into her left hip and left groin region status post bilateral L3, L4, L5 facet medial branch nerve blocks #2 who presents for follow-up.  Patient does not endorse any significant pain relief or functional benefit after her previous facet medial branch nerve blocks.  She is still endorsing significant axial low back, buttock, groin pain.  She is also endorsing left lateral hip pain.  Given that the patient did not receive any significant benefit after her bilateral L3, L4, L5 facet medial branch nerve block #2, we will defer proceeding with lumbar radiofrequency ablation.  In evaluation of the patient's symptoms, patient's axial low back, buttock, groin pain could be stemming from her left SI joint as well as her left hip.  Patient does have tenderness upon palpation overlying her left greater trochanteric bursa which could be consistent with trochanteric bursitis.  Patient also has positive Patrick's as well as Faber's on the left suggesting SI joint pathology.  In regards to therapeutic plan, we discussed diagnostic left SI joint injection as well as left greater trochanteric bursa injection.  Risks and benefits of this procedure were discussed.  Patient would like to proceed.  We will also refill the patient's oxycodone as below.  Patient found benefit with this medication given that it helped with her pain and also helped with sleep.  Plan: -We will not proceed with lumbar radiofrequency ablation since second set of diagnostic lumbar facet medial branch nerve blocks were not effective -For possible left SI joint pathology/left greater trochanteric bursitis, we discussed left SI joint injection and left greater trochanteric bursa injection under fluoroscopy and moderate sedation. -Refill of oxycodone as below.  Time Note: Greater than 50% of the 25 minute(s) of face-to-face time spent with Ms.  Fleeman, was spent in counseling/coordination of care regarding: Ms. Gerard primary cause of pain, the treatment plan, treatment alternatives, the risks and possible complications of proposed treatment, the results, interpretation and significance of  her recent diagnostic interventional treatment(s), realistic expectations, the goals of pain management (increased in functionality), the medication agreement and the patient's responsibilities when it comes to controlled substances.  Plan of Care  Pharmacotherapy (Medications Ordered): Meds ordered this encounter  Medications  . oxyCODONE (OXY IR/ROXICODONE) 5 MG immediate release tablet    Sig: Take 1 tablet (5 mg total) by mouth daily  as needed for severe pain.    Dispense:  30 tablet    Refill:  0    Do not place this medication, or any other prescription from our practice, on "Automatic Refill". Patient may have prescription filled one day early if pharmacy is closed on scheduled refill date. Do not fill until:  To last until:   Lab-work, procedure(s), and/or referral(s): Orders Placed This Encounter  Procedures  . SACROILIAC JOINT INJECTION  . Large Joint Injection/Arthrocentesis      Provider-requested follow-up: Return in about 9 days (around 02/12/2018) for Procedure.  Future Appointments  Date Time Provider [place]  02/12/2018  9:00 AM [name] Santa, MD [facility] None  03/10/2018  9:00 AM Archie Balboa, LCAS-A [facility] None  04/22/2018  8:00 AM Daron Offer, Richard Miu, MD [facility] None  04/24/2018  9:30 AM Ward Givens, NP [facility] None    Primary Care Physician: [name] Pink, MD Location: [facility] Note by: [name] [name], M.D Date: 02/03/2018; Time: 2:35 PM  Patient Instructions  ____________________________________________________________________________________________  General Risks and Possible Complications  Patient Responsibilities: It is important that you read this as it  is part of your informed consent. It is our duty to inform you of the risks and possible complications associated with treatments offered to you. It is your responsibility as a patient to read this and to ask questions about anything that is not clear or that you believe was not covered in this document.  Patient's Rights: You have the right to refuse treatment. You also have the right to change your mind, even after initially having agreed to have the treatment done. However, under this last option, if you wait until the last second to change your mind, you may be charged for the materials used up to that point.  Introduction: Medicine is not an Chief Strategy Officer. Everything in Medicine, including the lack of treatment(s), carries the potential for danger, harm, or loss (which is by definition: Risk). In Medicine, a complication is a secondary problem, condition, or disease that can aggravate an already existing one. All treatments carry the risk of possible complications. The fact that a side effects or complications occurs, does not imply that the treatment was conducted incorrectly. It must be clearly understood that these can happen even when everything is done following the highest safety standards.  No treatment: You can choose not to proceed with the proposed treatment alternative. The "PRO(s)" would include: avoiding the risk of complications associated with the therapy. The "CON(s)" would include: not getting any of the treatment benefits. These benefits fall under one of three categories: diagnostic; therapeutic; and/or palliative. Diagnostic benefits include: getting information which can ultimately lead to improvement of the disease or symptom(s). Therapeutic benefits are those associated with the successful treatment of the disease. Finally, palliative benefits are those related to the decrease of the primary symptoms, without necessarily curing the condition (example: decreasing the pain from a  flare-up of a chronic condition, such as incurable terminal cancer).  General Risks and Complications: These are associated to most interventional treatments. They can occur alone, or in combination. They fall under one of the following six (6) categories: no benefit or worsening of symptoms; bleeding; infection; nerve damage; allergic reactions; and/or death. 1. No benefits or worsening of symptoms: In Medicine there are no guarantees, only probabilities. No healthcare provider can ever guarantee that a medical treatment will work, they can only state the probability that it may. Furthermore, there is always the possibility  that the condition may worsen, either directly, or indirectly, as a consequence of the treatment. 2. Bleeding: This is more common if the patient is taking a blood thinner, either prescription or over the counter (example: Goody Powders, Fish oil, Aspirin, Garlic, etc.), or if suffering a condition associated with impaired coagulation (example: Hemophilia, cirrhosis of the liver, low platelet counts, etc.). However, even if you do not have one on these, it can still happen. If you have any of these conditions, or take one of these drugs, make sure to notify your treating physician. 3. Infection: This is more common in patients with a compromised immune system, either due to disease (example: diabetes, cancer, human immunodeficiency virus [HIV], etc.), or due to medications or treatments (example: therapies used to treat cancer and rheumatological diseases). However, even if you do not have one on these, it can still happen. If you have any of these conditions, or take one of these drugs, make sure to notify your treating physician. 4. Nerve Damage: This is more common when the treatment is an invasive one, but it can also happen with the use of medications, such as those used in the treatment of cancer. The damage can occur to small secondary nerves, or to large primary ones, such as those  in the spinal cord and brain. This damage may be temporary or permanent and it may lead to impairments that can range from temporary numbness to permanent paralysis and/or brain death. 5. Allergic Reactions: Any time a substance or material comes in contact with our body, there is the possibility of an allergic reaction. These can range from a mild skin rash (contact dermatitis) to a severe systemic reaction (anaphylactic reaction), which can result in death. 6. Death: In general, any medical intervention can result in death, most of the time due to an unforeseen complication. ____________________________________________________________________________________________  Sacroiliac (SI) Joint Injection Patient Information  Description: The sacroiliac joint connects the scrum (very low back and tailbone) to the ilium (a pelvic bone which also forms half of the hip joint).  Normally this joint experiences very little motion.  When this joint becomes inflamed or unstable low back and or hip and pelvis pain may result.  Injection of this joint with local anesthetics (numbing medicines) and steroids can provide diagnostic information and reduce pain.  This injection is performed with the aid of x-ray guidance into the tailbone area while you are lying on your stomach.   You may experience an electrical sensation down the leg while this is being done.  You may also experience numbness.  We also may ask if we are reproducing your normal pain during the injection.  Conditions which may be treated SI injection:   Low back, buttock, hip or leg pain  Preparation for the Injection:  1. Do not eat any solid food or dairy products within 8 hours of your appointment.  2. You may drink clear liquids up to 3 hours before appointment.  Clear liquids include water, black coffee, juice or soda.  No milk or cream please. 3. You may take your regular medications, including pain medications with a sip of water before  your appointment.  Diabetics should hold regular insulin (if take separately) and take 1/2 normal NPH dose the morning of the procedure.  Carry some sugar containing items with you to your appointment. 4. A driver must accompany you and be prepared to drive you home after your procedure. 5. Bring all of your current medications with you. 6. An  IV may be inserted and sedation may be given at the discretion of the physician. 7. A blood pressure cuff, EKG and other monitors will often be applied during the procedure.  Some patients may need to have extra oxygen administered for a short period.  8. You will be asked to provide medical information, including your allergies, prior to the procedure.  We must know immediately if you are taking blood thinners (like Coumadin/Warfarin) or if you are allergic to IV iodine contrast (dye).  We must know if you could possible be pregnant.  Possible side effects:   Bleeding from needle site  Infection (rare, may require surgery)  Nerve injury (rare)  Numbness & tingling (temporary)  A brief convulsion or seizure  Light-headedness (temporary)  Pain at injection site (several days)  Decreased blood pressure (temporary)  Weakness in the leg (temporary)   Call if you experience:   New onset weakness or numbness of an extremity below the injection site that last more than 8 hours.  Hives or difficulty breathing ( go to the emergency room)  Inflammation or drainage at the injection site  Any new symptoms which are concerning to you  Please note:  Although the local anesthetic injected can often make your back/ hip/ buttock/ leg feel good for several hours after the injections, the pain will likely return.  It takes 3-7 days for steroids to work in the sacroiliac area.  You may not notice any pain relief for at least that one week.  If effective, we will often do a series of three injections spaced 3-6 weeks apart to maximally decrease your  pain.  After the initial series, we generally will wait some months before a repeat injection of the same type.  If you have any questions, please call [phone] [facility]

## 2018-02-03 NOTE — Patient Instructions (Addendum)
____________________________________________________________________________________________  General Risks and Possible Complications  Patient Responsibilities: It is important that you read this as it is part of your informed consent. It is our duty to inform you of the risks and possible complications associated with treatments offered to you. It is your responsibility as a patient to read this and to ask questions about anything that is not clear or that you believe was not covered in this document.  Patient's Rights: You have the right to refuse treatment. You also have the right to change your mind, even after initially having agreed to have the treatment done. However, under this last option, if you wait until the last second to change your mind, you may be charged for the materials used up to that point.  Introduction: Medicine is not an exact science. Everything in Medicine, including the lack of treatment(s), carries the potential for danger, harm, or loss (which is by definition: Risk). In Medicine, a complication is a secondary problem, condition, or disease that can aggravate an already existing one. All treatments carry the risk of possible complications. The fact that a side effects or complications occurs, does not imply that the treatment was conducted incorrectly. It must be clearly understood that these can happen even when everything is done following the highest safety standards.  No treatment: You can choose not to proceed with the proposed treatment alternative. The "PRO(s)" would include: avoiding the risk of complications associated with the therapy. The "CON(s)" would include: not getting any of the treatment benefits. These benefits fall under one of three categories: diagnostic; therapeutic; and/or palliative. Diagnostic benefits include: getting information which can ultimately lead to improvement of the disease or symptom(s). Therapeutic benefits are those associated with the  successful treatment of the disease. Finally, palliative benefits are those related to the decrease of the primary symptoms, without necessarily curing the condition (example: decreasing the pain from a flare-up of a chronic condition, such as incurable terminal cancer).  General Risks and Complications: These are associated to most interventional treatments. They can occur alone, or in combination. They fall under one of the following six (6) categories: no benefit or worsening of symptoms; bleeding; infection; nerve damage; allergic reactions; and/or death. 1. No benefits or worsening of symptoms: In Medicine there are no guarantees, only probabilities. No healthcare provider can ever guarantee that a medical treatment will work, they can only state the probability that it may. Furthermore, there is always the possibility that the condition may worsen, either directly, or indirectly, as a consequence of the treatment. 2. Bleeding: This is more common if the patient is taking a blood thinner, either prescription or over the counter (example: Goody Powders, Fish oil, Aspirin, Garlic, etc.), or if suffering a condition associated with impaired coagulation (example: Hemophilia, cirrhosis of the liver, low platelet counts, etc.). However, even if you do not have one on these, it can still happen. If you have any of these conditions, or take one of these drugs, make sure to notify your treating physician. 3. Infection: This is more common in patients with a compromised immune system, either due to disease (example: diabetes, cancer, human immunodeficiency virus [HIV], etc.), or due to medications or treatments (example: therapies used to treat cancer and rheumatological diseases). However, even if you do not have one on these, it can still happen. If you have any of these conditions, or take one of these drugs, make sure to notify your treating physician. 4. Nerve Damage: This is more common when the   treatment is  an invasive one, but it can also happen with the use of medications, such as those used in the treatment of cancer. The damage can occur to small secondary nerves, or to large primary ones, such as those in the spinal cord and brain. This damage may be temporary or permanent and it may lead to impairments that can range from temporary numbness to permanent paralysis and/or brain death. 5. Allergic Reactions: Any time a substance or material comes in contact with our body, there is the possibility of an allergic reaction. These can range from a mild skin rash (contact dermatitis) to a severe systemic reaction (anaphylactic reaction), which can result in death. 6. Death: In general, any medical intervention can result in death, most of the time due to an unforeseen complication. ____________________________________________________________________________________________  Sacroiliac (SI) Joint Injection Patient Information  Description: The sacroiliac joint connects the scrum (very low back and tailbone) to the ilium (a pelvic bone which also forms half of the hip joint).  Normally this joint experiences very little motion.  When this joint becomes inflamed or unstable low back and or hip and pelvis pain may result.  Injection of this joint with local anesthetics (numbing medicines) and steroids can provide diagnostic information and reduce pain.  This injection is performed with the aid of x-ray guidance into the tailbone area while you are lying on your stomach.   You may experience an electrical sensation down the leg while this is being done.  You may also experience numbness.  We also may ask if we are reproducing your normal pain during the injection.  Conditions which may be treated SI injection:   Low back, buttock, hip or leg pain  Preparation for the Injection:  1. Do not eat any solid food or dairy products within 8 hours of your appointment.  2. You may drink clear liquids up to 3 hours  before appointment.  Clear liquids include water, black coffee, juice or soda.  No milk or cream please. 3. You may take your regular medications, including pain medications with a sip of water before your appointment.  Diabetics should hold regular insulin (if take separately) and take 1/2 normal NPH dose the morning of the procedure.  Carry some sugar containing items with you to your appointment. 4. A driver must accompany you and be prepared to drive you home after your procedure. 5. Bring all of your current medications with you. 6. An IV may be inserted and sedation may be given at the discretion of the physician. 7. A blood pressure cuff, EKG and other monitors will often be applied during the procedure.  Some patients may need to have extra oxygen administered for a short period.  8. You will be asked to provide medical information, including your allergies, prior to the procedure.  We must know immediately if you are taking blood thinners (like Coumadin/Warfarin) or if you are allergic to IV iodine contrast (dye).  We must know if you could possible be pregnant.  Possible side effects:   Bleeding from needle site  Infection (rare, may require surgery)  Nerve injury (rare)  Numbness & tingling (temporary)  A brief convulsion or seizure  Light-headedness (temporary)  Pain at injection site (several days)  Decreased blood pressure (temporary)  Weakness in the leg (temporary)   Call if you experience:   New onset weakness or numbness of an extremity below the injection site that last more than 8 hours.  Hives or difficulty breathing (   go to the emergency room)  Inflammation or drainage at the injection site  Any new symptoms which are concerning to you  Please note:  Although the local anesthetic injected can often make your back/ hip/ buttock/ leg feel good for several hours after the injections, the pain will likely return.  It takes 3-7 days for steroids to work in  the sacroiliac area.  You may not notice any pain relief for at least that one week.  If effective, we will often do a series of three injections spaced 3-6 weeks apart to maximally decrease your pain.  After the initial series, we generally will wait some months before a repeat injection of the same type.  If you have any questions, please call [phone] [facility]   

## 2018-02-04 ENCOUNTER — Encounter (HOSPITAL_COMMUNITY): Payer: Self-pay | Admitting: Licensed Clinical Social Worker

## 2018-02-04 NOTE — Progress Notes (Signed)
   THERAPIST PROGRESS NOTE  Session Time: 9-10  Participation Level: Active  Behavioral Response: Neat and Well GroomedAlertEuthymic  Type of Therapy: Individual Therapy  Treatment Goals addressed: Diagnosis: PTSD  Interventions: CBT and Supportive  Summary: [name] [name] is a 52 y.o. female who presents with trauma-related sxs of depression, chronic pain, and difficulties staying on task. She reports continued "sustained 10-level pain" near constantly. Pt states she had a brain scan done recently which revealed that she has "constant low-level seizure activity". Pt states that her continued stress also contributes to her pain and seizures. Counselor and pt discussed coping skills for managing pain, reaching out to others for support when depressed, and increasing her communication assertiveness. Counselor discussed how pt's life would be different w/o pain/injury. She states she would be working. 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   Suicidal/Homicidal: Nowithout intent/plan  Therapist Response: Counselor used open questions, CBT negative thought challenging, replacement thought techniques, supportive reframing, encouragement, validation and reflection to increase pt disclosure.  Plan: Return again in 4 weeks.  Diagnosis:    ICD-10-CM   1. Chronic post-traumatic stress disorder (PTSD) F43.12        Archie Balboa, LCAS-A 02/04/2018

## 2018-02-08 ENCOUNTER — Telehealth: Payer: Self-pay | Admitting: Neurology

## 2018-02-08 NOTE — Telephone Encounter (Signed)
Sh called Saturday that her headache was worse and she thought she may have a seizure.    Chart was reviewed and she is noted to already be on multiple medications.    I advised her that I would forward a note to you.   I advised her that she could go to the ED if she felt she needed to be seen right away or if she worsened.

## 2018-02-10 ENCOUNTER — Other Ambulatory Visit (INDEPENDENT_AMBULATORY_CARE_PROVIDER_SITE_OTHER): Payer: Self-pay

## 2018-02-10 ENCOUNTER — Other Ambulatory Visit: Payer: Self-pay | Admitting: Neurology

## 2018-02-10 ENCOUNTER — Encounter: Payer: Self-pay | Admitting: Adult Health

## 2018-02-10 DIAGNOSIS — Z0289 Encounter for other administrative examinations: Secondary | ICD-10-CM

## 2018-02-10 DIAGNOSIS — R569 Unspecified convulsions: Secondary | ICD-10-CM

## 2018-02-10 DIAGNOSIS — G40909 Epilepsy, unspecified, not intractable, without status epilepticus: Secondary | ICD-10-CM

## 2018-02-10 NOTE — Telephone Encounter (Signed)
Pt called today, she "blacked out" twice yesterday. Her husband told to her she's been having seizures all last week during the night. Please call to advise at [phone]

## 2018-02-10 NOTE — Telephone Encounter (Signed)
Called the patient and made her aware that I reviewed her messages with Dr Brett Fairy. Dr Dohmeier has recommended the pt come in for a lamictal level to be drawn today. Pt verbalized understanding. Was able to set the pt up with a f/u with Ward Givens, NP to follow up with medication adjustments pending the lamictal level. Pt verbalized understanding. Pt is scheduled 2 pm on 02/11/18 with a 1:30 check in

## 2018-02-11 ENCOUNTER — Encounter: Payer: Self-pay | Admitting: Adult Health

## 2018-02-11 ENCOUNTER — Ambulatory Visit: Payer: BC Managed Care – PPO | Admitting: Adult Health

## 2018-02-11 ENCOUNTER — Telehealth: Payer: Self-pay | Admitting: Adult Health

## 2018-02-11 VITALS — BP 101/62 | HR 72 | Ht 62.0 in | Wt 280.4 lb

## 2018-02-11 DIAGNOSIS — R569 Unspecified convulsions: Secondary | ICD-10-CM | POA: Diagnosis not present

## 2018-02-11 DIAGNOSIS — G43011 Migraine without aura, intractable, with status migrainosus: Secondary | ICD-10-CM | POA: Diagnosis not present

## 2018-02-11 MED ORDER — LACOSAMIDE 50 MG PO TABS: 50.0000 mg | ORAL_TABLET | Freq: Two times a day (BID) | ORAL | 5 refills | Status: DC

## 2018-02-11 NOTE — Addendum Note (Signed)
Addended by: Trudie Buckler on: 02/11/2018 02:39 PM   Modules accepted: Orders
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
## 2018-02-11 NOTE — Patient Instructions (Addendum)
Your Plan:  Continue Gabapentin and Topamax and Ajovy for migraines Start Vimpat 50 mg twice a day for seizures  To prevent or relieve headaches, try the following: Cool Compress. Lie down and place a cool compress on your head.  Avoid headache triggers. If certain foods or odors seem to have triggered your migraines in the past, avoid them. A headache diary might help you identify triggers.  Include physical activity in your daily routine. Try a daily walk or other moderate aerobic exercise.  Manage stress. Find healthy ways to cope with the stressors, such as delegating tasks on your to-do list.  Practice relaxation techniques. Try deep breathing, yoga, massage and visualization.  Eat regularly. Eating regularly scheduled meals and maintaining a healthy diet might help prevent headaches. Also, drink plenty of fluids.  Follow a regular sleep schedule. Sleep deprivation might contribute to headaches Consider biofeedback. With this mind-body technique, you learn to control certain bodily functions - such as muscle tension, heart rate and blood pressure - to prevent headaches or reduce headache pain.    Proceed to emergency room if you experience new or worsening symptoms or symptoms do not resolve, if you have new neurologic symptoms or if headache is severe, or for any concerning symptom.   Provided education and documentation from American headache Society toolbox including articles on: chronic migraine medication overuse headache, chronic migraines, prevention of migraines, behavioral and other nonpharmacologic treatments for headache.  Thank you for coming to see [name] at [facility]. I hope we have been able to provide you high quality care today.  You may receive a patient satisfaction survey over the next few weeks. We would appreciate your feedback and comments so that we may continue to improve ourselves and the health of our patients.  Lacosamide tablets What is this  medicine? LACOSAMIDE (la KOE sa mide) is used to control seizures caused by certain types of epilepsy. This medicine may be used for other purposes; ask your health care provider or pharmacist if you have questions. COMMON BRAND NAME(S): Vimpat What should I tell my health care provider before I take this medicine? They need to know if you have any of these conditions: -dehydration -heart disease, including heart failure -history of a drug or alcohol abuse problem -kidney disease -liver disease -suicidal thoughts, plans, or attempt; a previous suicide attempt by you or a family member -an unusual or allergic reaction to lacosamide, other medicines, foods, dyes, or preservatives -pregnant or trying to get pregnant -breast-feeding How should I use this medicine? Take this medicine by mouth with a glass of water. You can take it with or without food. Follow the directions on the prescription label. Take your doses at regular intervals. Do not take your medicine more often than directed. Do not stop taking except on the advice of your doctor or health care professional. A special MedGuide will be given to you by the pharmacist with each prescription and refill. Be sure to read this information carefully each time. Talk to your pediatrician regarding the use of this medicine in children. While this drug may be prescribed for children as young as 69 years of age for selected conditions, precautions do apply. Overdosage: If you think you have taken too much of this medicine contact a poison control center or emergency room at once. NOTE: This medicine is only for you. Do not share this medicine with others. What if I miss a dose? If you miss a dose, take it as soon as  you can. If it is almost time for your next dose, take only that dose. Do not take double or extra doses. What may interact with this medicine? -atazanavir -beta-blockers like metoprolol and propranolol -calcium channel blockers like  diltiazem and verapamil -digoxin -dronedarone -lopinavir/ritonavir This list may not describe all possible interactions. Give your health care provider a list of all the medicines, herbs, non-prescription drugs, or dietary supplements you use. Also tell them if you smoke, drink alcohol, or use illegal drugs. Some items may interact with your medicine. What should I watch for while using this medicine? Visit your doctor or health care professional for regular checks on your progress. This medicine needs careful monitoring. Wear a medical ID bracelet or chain, and carry a card that describes your disease and details of your medicine and dosage times. You may get drowsy or dizzy. Do not drive, use machinery, or do anything that needs mental alertness until you know how this medicine affects you. Do not stand or sit up quickly, especially if you are an older patient. This reduces the risk of dizzy or fainting spells. Alcohol may interfere with the effect of this medicine. Avoid alcoholic drinks. The use of this medicine may increase the chance of suicidal thoughts or actions. Pay special attention to how you are responding while on this medicine. Any worsening of mood, or thoughts of suicide or dying should be reported to your health care professional right away. Women who become pregnant while using this medicine may enroll in the [place] Pregnancy Registry by calling [phone]. This registry collects information about the safety of antiepileptic drug use during pregnancy. What side effects may I notice from receiving this medicine? Side effects that you should report to your doctor or health care professional as soon as possible: -allergic reactions like skin rash, itching or hives, swelling of the face, lips, or tongue -confusion -feeling faint or lightheaded, falls -fever -irregular heart beat -loss of memory -suicidal thoughts or other mood changes -unusually weak  or tired -yellowing of the eyes, skin Side effects that usually do not require medical attention (report to your doctor or health care professional if they continue or are bothersome): -constipation -diarrhea -drowsiness -dry mouth -headache -nausea This list may not describe all possible side effects. Call your doctor for medical advice about side effects. You may report side effects to FDA at 1-800-FDA-1088. Where should I keep my medicine? Keep out of the reach of children. This medicine can be abused. Keep your medicine in a safe place to protect it from theft. Do not share this medicine with anyone. Selling or giving away this medicine is dangerous and against the law. This medicine may cause accidental overdose and death if it taken by other adults, children, or pets. Mix any unused medicine with a substance like cat litter or coffee grounds. Then throw the medicine away in a sealed container like a sealed bag or a coffee can with a lid. Do not use the medicine after the expiration date. Store at room temperature between 15 and 30 degrees C (59 and 86 degrees F). NOTE: This sheet is a summary. It may not cover all possible information. If you have questions about this medicine, talk to your doctor, pharmacist, or health care provider.  2018 Elsevier/Gold Standard (2016-07-05 08:32:52)

## 2018-02-12 ENCOUNTER — Ambulatory Visit: Payer: Self-pay | Admitting: Student in an Organized Health Care Education/Training Program

## 2018-02-12 DIAGNOSIS — Z0289 Encounter for other administrative examinations: Secondary | ICD-10-CM

## 2018-02-12 LAB — LAMOTRIGINE LEVEL: Lamotrigine Lvl: 2.4 ug/mL (ref 2.0–20.0)

## 2018-02-13 NOTE — Telephone Encounter (Signed)
[name] [name] will have a sooner apt in July.

## 2018-02-13 NOTE — Telephone Encounter (Signed)
[name] [name] from [facility] has called to inform the appointment was not properly scheduled so it must be r/s and that will not be until October.  Please call her at [phone]

## 2018-02-19 ENCOUNTER — Telehealth: Payer: Self-pay | Admitting: *Deleted

## 2018-02-19 NOTE — Telephone Encounter (Signed)
Pt premier form faxed on 02/17/18 I mailed a copy to the pt on 02/17/18

## 2018-03-10 ENCOUNTER — Ambulatory Visit (INDEPENDENT_AMBULATORY_CARE_PROVIDER_SITE_OTHER): Payer: BC Managed Care – PPO | Admitting: Licensed Clinical Social Worker

## 2018-03-10 DIAGNOSIS — F4312 Post-traumatic stress disorder, chronic: Secondary | ICD-10-CM

## 2018-03-11 ENCOUNTER — Encounter (HOSPITAL_COMMUNITY): Payer: Self-pay | Admitting: Licensed Clinical Social Worker
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
## 2018-03-12 ENCOUNTER — Ambulatory Visit (HOSPITAL_BASED_OUTPATIENT_CLINIC_OR_DEPARTMENT_OTHER): Payer: BC Managed Care – PPO | Admitting: Student in an Organized Health Care Education/Training Program

## 2018-03-12 ENCOUNTER — Other Ambulatory Visit: Payer: Self-pay

## 2018-03-12 ENCOUNTER — Encounter: Payer: Self-pay | Admitting: Student in an Organized Health Care Education/Training Program

## 2018-03-12 ENCOUNTER — Ambulatory Visit
Admission: RE | Admit: 2018-03-12 | Discharge: 2018-03-12 | Disposition: A | Discharge status: Home / Self Care | Payer: BC Managed Care – PPO | Source: Ambulatory Visit | Attending: Student in an Organized Health Care Education/Training Program | Admitting: Student in an Organized Health Care Education/Training Program

## 2018-03-12 VITALS — BP 135/74 | HR 56 | Temp 98.4°F | Resp 20 | Ht 62.5 in | Wt 274.0 lb

## 2018-03-12 DIAGNOSIS — Z881 Allergy status to other antibiotic agents status: Secondary | ICD-10-CM | POA: Diagnosis not present

## 2018-03-12 DIAGNOSIS — G8929 Other chronic pain: Secondary | ICD-10-CM | POA: Insufficient documentation

## 2018-03-12 DIAGNOSIS — M545 Low back pain: Secondary | ICD-10-CM | POA: Insufficient documentation

## 2018-03-12 DIAGNOSIS — Z880 Allergy status to penicillin: Secondary | ICD-10-CM | POA: Insufficient documentation

## 2018-03-12 DIAGNOSIS — M533 Sacrococcygeal disorders, not elsewhere classified: Secondary | ICD-10-CM | POA: Diagnosis not present

## 2018-03-12 DIAGNOSIS — M7062 Trochanteric bursitis, left hip: Secondary | ICD-10-CM | POA: Diagnosis not present

## 2018-03-12 MED ORDER — OXYCODONE HCL 5 MG PO TABS: 5.0000 mg | ORAL_TABLET | Freq: Every day | ORAL | 0 refills | Status: DC | PRN

## 2018-03-12 MED ORDER — DEXAMETHASONE SODIUM PHOSPHATE 10 MG/ML IJ SOLN
10.0000 mg | Freq: Once | INTRAMUSCULAR | Status: AC
  Administered 2018-03-12: 10 mg

## 2018-03-12 MED ORDER — FENTANYL CITRATE (PF) 100 MCG/2ML IJ SOLN: 25.0000 ug | INTRAMUSCULAR | Status: DC | PRN

## 2018-03-12 MED ORDER — DEXAMETHASONE SODIUM PHOSPHATE 10 MG/ML IJ SOLN
INTRAMUSCULAR | Status: AC
  Filled 2018-03-12: qty 2

## 2018-03-12 MED ORDER — FENTANYL CITRATE (PF) 100 MCG/2ML IJ SOLN
INTRAMUSCULAR | Status: AC
  Filled 2018-03-12: qty 2

## 2018-03-12 MED ORDER — ROPIVACAINE HCL 2 MG/ML IJ SOLN: 10.0000 mL | Freq: Once | INTRAMUSCULAR | Status: DC

## 2018-03-12 MED ORDER — LIDOCAINE HCL (PF) 1 % IJ SOLN
INTRAMUSCULAR | Status: AC
  Filled 2018-03-12: qty 5

## 2018-03-12 MED ORDER — ROPIVACAINE HCL 2 MG/ML IJ SOLN
INTRAMUSCULAR | Status: AC
  Filled 2018-03-12: qty 20

## 2018-03-12 MED ORDER — TIZANIDINE HCL 4 MG PO TABS: 4.0000 mg | ORAL_TABLET | Freq: Three times a day (TID) | ORAL | 2 refills | Status: DC | PRN

## 2018-03-12 MED ORDER — LIDOCAINE HCL 1 % IJ SOLN
10.0000 mL | Freq: Once | INTRAMUSCULAR | Status: AC
  Administered 2018-03-12: 10 mL

## 2018-03-12 MED ORDER — LACTATED RINGERS IV SOLN
1000.0000 mL | Freq: Once | INTRAVENOUS | Status: AC
  Administered 2018-03-12: 1000 mL via INTRAVENOUS

## 2018-03-12 NOTE — Patient Instructions (Addendum)
You have been given a script for oxycodone today.      Post-procedure Information What to expect: Most procedures involve the use of a local anesthetic (numbing medicine), and a steroid (anti-inflammatory medicine).  The local anesthetics may cause temporary numbness and weakness of the legs or arms, depending on the location of the block. This numbness/weakness may last 4-6 hours, depending on the local anesthetic used. In rare instances, it can last up to 24 hours. While numb, you must be very careful not to injure the extremity.  After any procedure, you could expect the pain to get better within 15-20 minutes. This relief is temporary and may last 4-6 hours. Once the local anesthetics wears off, you could experience discomfort, possibly more than usual, for up to 10 (ten) days. In the case of radiofrequencies, it may last up to 6 weeks. Surgeries may take up to 8 weeks for the healing process. The discomfort is due to the irritation caused by needles going through skin and muscle. To minimize the discomfort, we recommend using ice the first day, and heat from then on. The ice should be applied for 15 minutes on, and 15 minutes off. Keep repeating this cycle until bedtime. Avoid applying the ice directly to the skin, to prevent frostbite. Heat should be used daily, until the pain improves (4-10 days). Be careful not to burn yourself.  Occasionally you may experience muscle spasms or cramps. These occur as a consequence of the irritation caused by the needle sticks to the muscle and the blood that will inevitably be lost into the surrounding muscle tissue. Blood tends to be very irritating to tissues, which tend to react by going into spasm. These spasms may start the same day of your procedure, but they may also take days to develop. This late onset type of spasm or cramp is usually caused by electrolyte imbalances triggered by the steroids, at the level of the kidney. Cramps and spasms tend to  respond well to muscle relaxants, multivitamins (some are triggered by the procedure, but may have their origins in vitamin deficiencies), and "Gatorade", or any sports drinks that can replenish any electrolyte imbalances. (If you are a diabetic, ask your pharmacist to get you a sugar-free brand.) Warm showers or baths may also be helpful. Stretching exercises are highly recommended. General Instructions:  Be alert for signs of possible infection: redness, swelling, heat, red streaks, elevated temperature, and/or fever. These typically appear 4 to 6 days after the procedure. Immediately notify your doctor if you experience unusual bleeding, difficulty breathing, or loss of bowel or bladder control. If you experience increased pain, do not increase your pain medicine intake, unless instructed by your pain physician. Post-Procedure Care:  Be careful in moving about. Muscle spasms in the area of the injection may occur. Applying ice or heat to the area is often helpful. The incidence of spinal headaches after epidural injections ranges between 1.4% and 6%. If you develop a headache that does not seem to respond to conservative therapy, please let your physician know. This can be treated with an epidural blood patch.   Post-procedure numbness or redness is to be expected, however it should average 4 to 6 hours. If numbness and weakness of your extremities begins to develop 4 to 6 hours after your procedure, and is felt to be progressing and worsening, immediately contact your physician.   Diet:  If you experience nausea, do not eat until this sensation goes away. If you had a "  Stellate Ganglion Block" for upper extremity "Reflex Sympathetic Dystrophy", do not eat or drink until your hoarseness goes away. In any case, always start with liquids first and if you tolerate them well, then slowly progress to more solid foods. Activity:  For the first 4 to 6 hours after the procedure, use caution in moving about as  you may experience numbness and/or weakness. Use caution in cooking, using household electrical appliances, and climbing steps. If you need to reach your Doctor call our office: [phone]) [phone] Monday-Thursday 8:00 am - 4:00 PM    Fridays: Closed     In case of an emergency: In case of emergency, call 911 or go to the nearest emergency room and have the physician there call us.  Interpretation of Procedure Every nerve block has two components: a diagnostic component, and a treatment component. Unrealistic expectations are the most common causes of "perceived failure".  In a perfect world, a single nerve block should be able to completely and permanently eliminate the pain. Sadly, the world is not perfect.  Most pain management nerve blocks are performed using local anesthetics and steroids. Steroids are responsible for any long-term benefit that you may experience. Their purpose is to decrease any chronic swelling that may exist in the area. Steroids begin to work immediately after being injected. However, most patients will not experience any benefits until 5 to 10 days after the injection, when the swelling has come down to the point where they can tell a difference. Steroids will only help if there is swelling to be treated. As such, they can assist with the diagnosis. If effective, they suggest an inflammatory component to the pain, and if ineffective, they rule out inflammation as the main cause or component of the problem. If the problem is one of mechanical compression, you will get no benefit from those steroids.   In the case of local anesthetics, they have a crucial role in the diagnosis of your condition. Most will begin to work within15 to 20 minutes after injection. The duration will depend on the type used (short- vs. Long-acting). It is of outmost importance that patients keep tract of their pain, after the procedure. To assist with this matter, a "Post-procedure Pain Diary" is provided.  Make sure to complete it and to bring it back to your follow-up appointment.  As long as the patient keeps accurate, detailed records of their symptoms after every procedure, and returns to have those interpreted, every procedure will provide [name] with invaluable information. Even a block that does not provide the patient with any relief, will always provide [name] with information about the mechanism and the origin of the pain. The only time a nerve block can be considered a waste of time is when patients do not keep track of the results, or do not keep their post-procedure appointment.  Reporting the results back to your physician The Pain Score  Pain is a subjective complaint. It cannot be seen, touched, or measured. We depend entirely on the patient's report of the pain in order to assess your condition and treatment. To evaluate the pain, we use a pain scale, where "0" means "No Pain", and a "10" is "the worst possible pain that you can even imagine" (i.e. something like been eaten alive by a shark or being torn apart by a lion).   You will frequently be asked to rate your pain. Please be as accurate, remember that medical decisions will be based on your responses. Please do not  rate your pain above a 10. Doing so is actually interpreted as "symptom magnification" (exaggeration), as well as lack of understanding with regards to the scale. To put this into perspective, when you tell us that your pain is at a 10 (ten), what you are saying is that there is nothing we can do to make this pain any worse. (Carefully think about that.)

## 2018-03-12 NOTE — Progress Notes (Signed)
Patient's Name: [name] [name]  MRN: [id number]  Referring Provider: [name], MD  DOB: 31-Dec-1965  PCP: [name], MD  DOS: 03/12/2018  Note by: [name], MD  Service setting: Ambulatory outpatient  Specialty: Interventional Pain Management  Patient type: Established  Location: [facility] (AMB) Pain Management Facility  Visit type: Interventional Procedure   Primary Reason for Visit: Interventional Pain Management Treatment. CC: Back Pain (lower) and Hip Pain (both, left is worse)  Procedure #1:  Anesthesia, Analgesia, Anxiolysis:  Type: Diagnostic Sacroiliac Joint Steroid Injection          Region: Inferior Lumbosacral Region Level: PIIS (Posterior Inferior Iliac Spine) Laterality: Left-Sided  Type: Moderate (Conscious) Sedation combined with Local Anesthesia Indication(s): Analgesia and Anxiety Route: Intravenous (IV) IV Access: Secured Sedation: Meaningful verbal contact was maintained at all times during the procedure  Local Anesthetic: Lidocaine 1%   Indications: 1. Chronic left SI joint pain   2. Trochanteric bursitis of left hip    Pain Score: Pre-procedure: 8 /10 Post-procedure: 0-No pain/10  Pre-op Assessment:  [name] [name] is a 52 y.o. (year old), female patient, seen today for interventional treatment. She  has a past surgical history that includes Cesarean section (1989); Esophagogastroduodenoscopy (N/A, 11/21/2012); Colonoscopy (N/A, 11/21/2012); Incisional hernia repair (N/A, 12/17/2012); Insertion of mesh (N/A, 12/17/2012); Cardiac catheterization (N/A, 01/21/2015); Appendectomy (1990s); Hernia repair; Dilation and curettage of uterus (1985); Ovarian cyst removal (2007; 2014); Abdominal hysterectomy (2005); Colonoscopy with propofol (N/A, 09/02/2017); and Esophagogastroduodenoscopy (egd) with propofol (N/A, 09/02/2017). [name] [name] has a current medication list which includes the following prescription(s): albuterol, albuterol, cetirizine, clonazepam, dicyclomine, estradiol,  fluoxetine, fremanezumab-vfrm, gabapentin, lacosamide, lamotrigine, metoprolol tartrate, omeprazole, ondansetron, oxycodone, ranitidine, sumatriptan, tizanidine, topiramate, trazodone, and vitamin d (ergocalciferol), and the following Facility-Administered Medications: fentanyl, fremanezumab-vfrm, and ropivacaine (pf) 2 mg/ml (0.2%). Her primarily concern today is the Back Pain (lower) and Hip Pain (both, left is worse)  Initial Vital Signs:  Pulse/HCG Rate: 76ECG Heart Rate: (!) 56 Temp: 98.6 F (37 C) Resp: 16 BP: 136/84 SpO2: 97 %  BMI: Estimated body mass index is 49.32 kg/m as calculated from the following:   Height as of this encounter: 5' 2.5" (1.588 m).   Weight as of this encounter: 274 lb (124.3 kg).  Risk Assessment: Allergies: Reviewed. She is allergic to amoxicillin; penicillins; biaxin [clarithromycin]; erythromycin base; and zithromax [azithromycin].  Allergy Precautions: None required Coagulopathies: Reviewed. None identified.  Blood-thinner therapy: None at this time Active Infection(s): Reviewed. None identified. [name] [name] is afebrile  Site Confirmation: [name] [name] was asked to confirm the procedure and laterality before marking the site Procedure checklist: Completed Consent: Before the procedure and under the influence of no sedative(s), amnesic(s), or anxiolytics, the patient was informed of the treatment options, risks and possible complications. To fulfill our ethical and legal obligations, as recommended by the American Medical Association's Code of Ethics, I have informed the patient of my clinical impression; the nature and purpose of the treatment or procedure; the risks, benefits, and possible complications of the intervention; the alternatives, including doing nothing; the risk(s) and benefit(s) of the alternative treatment(s) or procedure(s); and the risk(s) and benefit(s) of doing nothing. The patient was provided information about the general risks and  possible complications associated with the procedure. These may include, but are not limited to: failure to achieve desired goals, infection, bleeding, organ or nerve damage, allergic reactions, paralysis, and death. In addition, the patient was informed of those risks and complications associated to the procedure, such as failure  to decrease pain; infection; bleeding; organ or nerve damage with subsequent damage to sensory, motor, and/or autonomic systems, resulting in permanent pain, numbness, and/or weakness of one or several areas of the body; allergic reactions; (i.e.: anaphylactic reaction); and/or death. Furthermore, the patient was informed of those risks and complications associated with the medications. These include, but are not limited to: allergic reactions (i.e.: anaphylactic or anaphylactoid reaction(s)); adrenal axis suppression; blood sugar elevation that in diabetics may result in ketoacidosis or comma; water retention that in patients with history of congestive heart failure may result in shortness of breath, pulmonary edema, and decompensation with resultant heart failure; weight gain; swelling or edema; medication-induced neural toxicity; particulate matter embolism and blood vessel occlusion with resultant organ, and/or nervous system infarction; and/or aseptic necrosis of one or more joints. Finally, the patient was informed that Medicine is not an exact science; therefore, there is also the possibility of unforeseen or unpredictable risks and/or possible complications that may result in a catastrophic outcome. The patient indicated having understood very clearly. We have given the patient no guarantees and we have made no promises. Enough time was given to the patient to ask questions, all of which were answered to the patient's satisfaction. [name] [name] has indicated that she wanted to continue with the procedure. Attestation: I, the ordering provider, attest that I have discussed with  the patient the benefits, risks, side-effects, alternatives, likelihood of achieving goals, and potential problems during recovery for the procedure that I have provided informed consent. Date  Time: 03/12/2018  9:26 AM  Pre-Procedure Preparation:  Monitoring: As per clinic protocol. Respiration, ETCO2, SpO2, BP, heart rate and rhythm monitor placed and checked for adequate function Safety Precautions: Patient was assessed for positional comfort and pressure points before starting the procedure. Time-out: I initiated and conducted the "Time-out" before starting the procedure, as per protocol. The patient was asked to participate by confirming the accuracy of the "Time Out" information. Verification of the correct person, site, and procedure were performed and confirmed by me, the nursing staff, and the patient. "Time-out" conducted as per Joint Commission's Universal Protocol (UP.01.01.01). Time:    Description of Procedure:          Position: Prone Target Area: Inferior, posterior, aspect of the sacroiliac fissure Approach: Posterior, paraspinal, ipsilateral approach. Area Prepped: Entire Lower Lumbosacral Region Prepping solution: ChloraPrep (2% chlorhexidine gluconate and 70% isopropyl alcohol) Safety Precautions: Aspiration looking for blood return was conducted prior to all injections. At no point did we inject any substances, as a needle was being advanced. No attempts were made at seeking any paresthesias. Safe injection practices and needle disposal techniques used. Medications properly checked for expiration dates. SDV (single dose vial) medications used. Description of the Procedure: Protocol guidelines were followed. The patient was placed in position over the procedure table. The target area was identified and the area prepped in the usual manner. Skin & deeper tissues infiltrated with local anesthetic. Appropriate amount of time allowed to pass for local anesthetics to take effect. The  procedure needle was advanced under fluoroscopic guidance into the sacroiliac joint until a firm endpoint was obtained. Proper needle placement secured. Negative aspiration confirmed. Solution injected in intermittent fashion, asking for systemic symptoms every 0.5cc of injectate. The needles were then removed and the area cleansed, making sure to leave some of the prepping solution back to take advantage of its long term bactericidal properties. Vitals:   03/12/18 1020 03/12/18 1030 03/12/18 1041 03/12/18 1049  BP: (!) 160/95 Marland Kitchen)  152/69 122/60 135/74  Pulse: 90 (!) 56    Resp: 15 18 19 20   Temp:  98.4 F (36.9 C)    TempSrc:      SpO2: 98% 99% 99% 100%  Weight:      Height:        Start Time:   hrs. End Time:   hrs. Materials:  Needle(s) Type: Regular needle Gauge: 22G Length: 3.5-in Medication(s): Please see orders for medications and dosing details. 5 cc solution made of 4 cc of 0.2% ropivacaine, 1 cc of Decadron 10 mg/cc.  2.5 cc injected intra-articular, 2.5 cc injected periarticular Imaging Guidance (Non-Spinal):          Type of Imaging Technique: Fluoroscopy Guidance (Non-Spinal) Indication(s): Assistance in needle guidance and placement for procedures requiring needle placement in or near specific anatomical locations not easily accessible without such assistance. Exposure Time: Please see nurses notes. Contrast: None used. Fluoroscopic Guidance: I was personally present during the use of fluoroscopy. "Tunnel Vision Technique" used to obtain the best possible view of the target area. Parallax error corrected before commencing the procedure. "Direction-depth-direction" technique used to introduce the needle under continuous pulsed fluoroscopy. Once target was reached, antero-posterior, oblique, and lateral fluoroscopic projection used confirm needle placement in all planes. Images permanently stored in EMR. Interpretation: No contrast injected.  Antibiotic Prophylaxis:    Anti-infectives (From admission, onward)   None     Indication(s): None identified  Post-operative Assessment:  Post-procedure Vital Signs:  Pulse/HCG Rate: (!) 5672 Temp: 98.4 F (36.9 C) Resp: 20 BP: 135/74 SpO2: 100 %  EBL: None  Complications: No immediate post-treatment complications observed by team, or reported by patient.  Note: The patient tolerated the entire procedure well. A repeat set of vitals were taken after the procedure and the patient was kept under observation following institutional policy, for this type of procedure. Post-procedural neurological assessment was performed, showing return to baseline, prior to discharge. The patient was provided with post-procedure discharge instructions, including a section on how to identify potential problems. Should any problems arise concerning this procedure, the patient was given instructions to immediately contact us, at any time, without hesitation. In any case, we plan to contact the patient by telephone for a follow-up status report regarding this interventional procedure.  Comments:  No additional relevant information.  Plan of Care  Refill tizanidine and oxycodone as below.  PMP checked and appropriate.  Follow-up in 4 to 5 weeks for postprocedural evaluation  Imaging Orders     DG C-Arm 1-60 Min-No Report Procedure Orders    No procedure(s) ordered today    Medications ordered for procedure: Meds ordered this encounter  Medications  . lactated ringers infusion 1,000 mL  . fentaNYL (SUBLIMAZE) injection 25-100 mcg    Make sure Narcan is available in the pyxis when using this medication. In the event of respiratory depression (RR< 8/min): Titrate NARCAN (naloxone) in increments of 0.1 to 0.2 mg IV at 2-3 minute intervals, until desired degree of reversal.  . lidocaine (XYLOCAINE) 1 % (with pres) injection 10 mL  . ropivacaine (PF) 2 mg/mL (0.2%) (NAROPIN) injection 10 mL  . dexamethasone (DECADRON) injection  10 mg  . dexamethasone (DECADRON) injection 10 mg  . oxyCODONE (OXY IR/ROXICODONE) 5 MG immediate release tablet    Sig: Take 1 tablet (5 mg total) by mouth daily as needed for severe pain.    Dispense:  30 tablet    Refill:  0    Do not place this  medication, or any other prescription from our practice, on "Automatic Refill". Patient may have prescription filled one day early if pharmacy is closed on scheduled refill date. Do not fill until:  To last until:  . tiZANidine (ZANAFLEX) 4 MG tablet    Sig: Take 1 tablet (4 mg total) by mouth every 8 (eight) hours as needed for muscle spasms.    Dispense:  90 tablet    Refill:  2   Medications administered: We administered lactated ringers, lidocaine, dexamethasone, and dexamethasone.  See the medical record for exact dosing, route, and time of administration.  New Prescriptions   No medications on file   Disposition: Discharge home  Discharge Date & Time: 03/12/2018; 1050 hrs.   Physician-requested Follow-up: Return in about 1 month (around 04/09/2018) for Post Procedure Evaluation, Medication Management.  Future Appointments  Date Time Provider [place]  04/08/2018  9:45 AM [name], MD [facility] None  04/15/2018  3:00 PM Archie Balboa, LCAS-A [facility] None  04/22/2018  8:00 AM Daron Offer, Richard Miu, MD [facility] None  04/24/2018  9:30 AM Ward Givens, NP [facility] None   Primary Care Physician: [name], MD Location: [facility] Out[facility] Note by: [name], MD Date: 03/12/2018; Time: 12:51 PM  Disclaimer:  Medicine is not an exact science. The only guarantee in medicine is that nothing is guaranteed. It is important to note that the decision to proceed with this intervention was based on the information collected from the patient. The Data and conclusions were drawn from the patient's questionnaire, the interview, and the physical examination. Because the information was provided in large part  by the patient, it cannot be guaranteed that it has not been purposely or unconsciously manipulated. Every effort has been made to obtain as much relevant data as possible for this evaluation. It is important to note that the conclusions that lead to this procedure are derived in large part from the available data. Always take into account that the treatment will also be dependent on availability of resources and existing treatment guidelines, considered by other Pain Management Practitioners as being common knowledge and practice, at the time of the intervention. For Medico-Legal purposes, it is also important to point out that variation in procedural techniques and pharmacological choices are the acceptable norm. The indications, contraindications, technique, and results of the above procedure should only be interpreted and judged by a Board-Certified Interventional Pain Specialist with extensive familiarity and expertise in the same exact procedure and technique.

## 2018-03-12 NOTE — Progress Notes (Signed)
Safety precautions to be maintained throughout the outpatient stay will include: orient to surroundings, keep bed in low position, maintain call bell within reach at all times, provide assistance with transfer out of bed and ambulation.  

## 2018-03-12 NOTE — Progress Notes (Signed)
Patient's Name: [name] [name]  MRN: [id number]  Referring Provider: [name], MD  DOB: Oct 23, 1965  PCP: [name], MD  DOS: 03/12/2018  Note by: [name], MD  Service setting: Ambulatory outpatient  Specialty: Interventional Pain Management  Patient type: Established  Location: [facility] (AMB) Pain Management Facility  Visit type: Interventional Procedure   Primary Reason for Visit: Interventional Pain Management Treatment. CC: Back Pain (lower) and Hip Pain (both, left is worse)  Procedure #2:  Anesthesia, Analgesia, Anxiolysis:  Type: Gluteofemoral Bursa Injection #1  Primary Purpose: Diagnostic Region: Upper (proximal) Femoral Region Level: Hip Joint Target Area: Superior aspect of the hip joint cavity, going thru the superior portion of the capsular ligament. Approach: Posterolateral approach Laterality: Left Position: Prone  Type: Local Anesthesia and IV Sedation Indication(s): Analgesia         Local Anesthetic: Lidocaine 1% Route: Infiltration ([place]/IM) IV Access: Secured Sedation: Meaningful verbal contact was maintained at all times during the procedure    Indications: 1. Chronic left SI joint pain   2. Trochanteric bursitis of left hip    Pain Score: Pre-procedure: 8 /10 Post-procedure: 0-No pain/10  Pre-op Assessment:  [name] [name] is a 52 y.o. (year old), female patient, seen today for interventional treatment. She  has a past surgical history that includes Cesarean section (1989); Esophagogastroduodenoscopy (N/A, 11/21/2012); Colonoscopy (N/A, 11/21/2012); Incisional hernia repair (N/A, 12/17/2012); Insertion of mesh (N/A, 12/17/2012); Cardiac catheterization (N/A, 01/21/2015); Appendectomy (1990s); Hernia repair; Dilation and curettage of uterus (1985); Ovarian cyst removal (2007; 2014); Abdominal hysterectomy (2005); Colonoscopy with propofol (N/A, 09/02/2017); and Esophagogastroduodenoscopy (egd) with propofol (N/A, 09/02/2017). [name] [name] has a current medication list  which includes the following prescription(s): albuterol, albuterol, cetirizine, clonazepam, dicyclomine, estradiol, fluoxetine, fremanezumab-vfrm, gabapentin, lacosamide, lamotrigine, metoprolol tartrate, omeprazole, ondansetron, oxycodone, ranitidine, sumatriptan, tizanidine, topiramate, trazodone, and vitamin d (ergocalciferol), and the following Facility-Administered Medications: fentanyl, fremanezumab-vfrm, and ropivacaine (pf) 2 mg/ml (0.2%). Her primarily concern today is the Back Pain (lower) and Hip Pain (both, left is worse)  Initial Vital Signs:  Pulse/HCG Rate: 76ECG Heart Rate: (!) 56 Temp: 98.6 F (37 C) Resp: 16 BP: 136/84 SpO2: 97 %  BMI: Estimated body mass index is 49.32 kg/m as calculated from the following:   Height as of this encounter: 5' 2.5" (1.588 m).   Weight as of this encounter: 274 lb (124.3 kg).  Risk Assessment: Allergies: Reviewed. She is allergic to amoxicillin; penicillins; biaxin [clarithromycin]; erythromycin base; and zithromax [azithromycin].  Allergy Precautions: None required Coagulopathies: Reviewed. None identified.  Blood-thinner therapy: None at this time Active Infection(s): Reviewed. None identified. [name] [name] is afebrile  Site Confirmation: [name] [name] was asked to confirm the procedure and laterality before marking the site Procedure checklist: Completed Consent: Before the procedure and under the influence of no sedative(s), amnesic(s), or anxiolytics, the patient was informed of the treatment options, risks and possible complications. To fulfill our ethical and legal obligations, as recommended by the American Medical Association's Code of Ethics, I have informed the patient of my clinical impression; the nature and purpose of the treatment or procedure; the risks, benefits, and possible complications of the intervention; the alternatives, including doing nothing; the risk(s) and benefit(s) of the alternative treatment(s) or procedure(s); and  the risk(s) and benefit(s) of doing nothing. The patient was provided information about the general risks and possible complications associated with the procedure. These may include, but are not limited to: failure to achieve desired goals, infection, bleeding, organ or nerve damage, allergic reactions, paralysis, and  death. In addition, the patient was informed of those risks and complications associated to the procedure, such as failure to decrease pain; infection; bleeding; organ or nerve damage with subsequent damage to sensory, motor, and/or autonomic systems, resulting in permanent pain, numbness, and/or weakness of one or several areas of the body; allergic reactions; (i.e.: anaphylactic reaction); and/or death. Furthermore, the patient was informed of those risks and complications associated with the medications. These include, but are not limited to: allergic reactions (i.e.: anaphylactic or anaphylactoid reaction(s)); adrenal axis suppression; blood sugar elevation that in diabetics may result in ketoacidosis or comma; water retention that in patients with history of congestive heart failure may result in shortness of breath, pulmonary edema, and decompensation with resultant heart failure; weight gain; swelling or edema; medication-induced neural toxicity; particulate matter embolism and blood vessel occlusion with resultant organ, and/or nervous system infarction; and/or aseptic necrosis of one or more joints. Finally, the patient was informed that Medicine is not an exact science; therefore, there is also the possibility of unforeseen or unpredictable risks and/or possible complications that may result in a catastrophic outcome. The patient indicated having understood very clearly. We have given the patient no guarantees and we have made no promises. Enough time was given to the patient to ask questions, all of which were answered to the patient's satisfaction. [name] has indicated that she  wanted to continue with the procedure. Attestation: I, the ordering provider, attest that I have discussed with the patient the benefits, risks, side-effects, alternatives, likelihood of achieving goals, and potential problems during recovery for the procedure that I have provided informed consent. Date  Time: 03/12/2018  9:20 AM  Pre-Procedure Preparation:  Monitoring: As per clinic protocol. Respiration, ETCO2, SpO2, BP, heart rate and rhythm monitor placed and checked for adequate function Safety Precautions: Patient was assessed for positional comfort and pressure points before starting the procedure. Time-out: I initiated and conducted the "Time-out" before starting the procedure, as per protocol. The patient was asked to participate by confirming the accuracy of the "Time Out" information. Verification of the correct person, site, and procedure were performed and confirmed by me, the nursing staff, and the patient. "Time-out" conducted as per Joint Commission's Universal Protocol (UP.01.01.01). Time:    Description of Procedure:          Area Prepped: Entire Posterolateral hip area. Prepping solution: ChloraPrep (2% chlorhexidine gluconate and 70% isopropyl alcohol) Safety Precautions: Aspiration looking for blood return was conducted prior to all injections. At no point did we inject any substances, as a needle was being advanced. No attempts were made at seeking any paresthesias. Safe injection practices and needle disposal techniques used. Medications properly checked for expiration dates. SDV (single dose vial) medications used. Description of the Procedure: Protocol guidelines were followed. The patient was placed in position over the procedure table. The target area was identified and the area prepped in the usual manner. Skin & deeper tissues infiltrated with local anesthetic. Appropriate amount of time allowed to pass for local anesthetics to take effect. The procedure needles were then  advanced to the target area. Proper needle placement secured. Negative aspiration confirmed. Solution injected in intermittent fashion, asking for systemic symptoms every 0.5cc of injectate. The needles were then removed and the area cleansed, making sure to leave some of the prepping solution back to take advantage of its long term bactericidal properties. Vitals:   03/12/18 1020 03/12/18 1030 03/12/18 1041 03/12/18 1049  BP: (!) 160/95 (!) 152/69 122/60 135/74  Pulse: 90 (!) 56    Resp: 15 18 19 20   Temp:  98.4 F (36.9 C)    TempSrc:      SpO2: 98% 99% 99% 100%  Weight:      Height:        Start Time:   hrs. End Time:   hrs.  Materials:  Needle(s) Type: Regular needle Gauge: 22G Length: 3.5-in Medication(s): Please see orders for medications and dosing details. 5 cc solution made of 4 cc of 0.2% ropivacaine, 1 cc of Decadron 10 mg/cc. Imaging Guidance (Non-Spinal):          Type of Imaging Technique: Fluoroscopy Guidance (Non-Spinal) Indication(s): Assistance in needle guidance and placement for procedures requiring needle placement in or near specific anatomical locations not easily accessible without such assistance. Exposure Time: Please see nurses notes. Contrast: None used. Fluoroscopic Guidance: I was personally present during the use of fluoroscopy. "Tunnel Vision Technique" used to obtain the best possible view of the target area. Parallax error corrected before commencing the procedure. "Direction-depth-direction" technique used to introduce the needle under continuous pulsed fluoroscopy. Once target was reached, antero-posterior, oblique, and lateral fluoroscopic projection used confirm needle placement in all planes. Images permanently stored in EMR. Interpretation: No contrast injected.  Antibiotic Prophylaxis:   Anti-infectives (From admission, onward)   None     Indication(s): None identified  Post-operative Assessment:  Post-procedure Vital Signs:  Pulse/HCG  Rate: (!) 5672 Temp: 98.4 F (36.9 C) Resp: 20 BP: 135/74 SpO2: 100 %  EBL: None  Complications: No immediate post-treatment complications observed by team, or reported by patient.  Note: The patient tolerated the entire procedure well. A repeat set of vitals were taken after the procedure and the patient was kept under observation following institutional policy, for this type of procedure. Post-procedural neurological assessment was performed, showing return to baseline, prior to discharge. The patient was provided with post-procedure discharge instructions, including a section on how to identify potential problems. Should any problems arise concerning this procedure, the patient was given instructions to immediately contact us, at any time, without hesitation. In any case, we plan to contact the patient by telephone for a follow-up status report regarding this interventional procedure.  Comments:  No additional relevant information.  Plan of Care   Imaging Orders     DG C-Arm 1-60 Min-No Report Procedure Orders    No procedure(s) ordered today    Medications ordered for procedure: Meds ordered this encounter  Medications  . lactated ringers infusion 1,000 mL  . fentaNYL (SUBLIMAZE) injection 25-100 mcg    Make sure Narcan is available in the pyxis when using this medication. In the event of respiratory depression (RR< 8/min): Titrate NARCAN (naloxone) in increments of 0.1 to 0.2 mg IV at 2-3 minute intervals, until desired degree of reversal.  . lidocaine (XYLOCAINE) 1 % (with pres) injection 10 mL  . ropivacaine (PF) 2 mg/mL (0.2%) (NAROPIN) injection 10 mL  . dexamethasone (DECADRON) injection 10 mg  . dexamethasone (DECADRON) injection 10 mg  . oxyCODONE (OXY IR/ROXICODONE) 5 MG immediate release tablet    Sig: Take 1 tablet (5 mg total) by mouth daily as needed for severe pain.    Dispense:  30 tablet    Refill:  0    Do not place this medication, or any other prescription  from our practice, on "Automatic Refill". Patient may have prescription filled one day early if pharmacy is closed on scheduled refill date. Do not fill until:  To  last until:  . tiZANidine (ZANAFLEX) 4 MG tablet    Sig: Take 1 tablet (4 mg total) by mouth every 8 (eight) hours as needed for muscle spasms.    Dispense:  90 tablet    Refill:  2   Medications administered: We administered lactated ringers, lidocaine, dexamethasone, and dexamethasone.  See the medical record for exact dosing, route, and time of administration.  New Prescriptions   No medications on file   Disposition: Discharge home  Discharge Date & Time: 03/12/2018; 1050 hrs.   Physician-requested Follow-up: Return in about 1 month (around 04/09/2018) for Post Procedure Evaluation, Medication Management.  Future Appointments  Date Time Provider [place]  04/08/2018  9:45 AM [name], MD [facility] None  04/15/2018  3:00 PM Archie Balboa, LCAS-A [facility] None  04/22/2018  8:00 AM Daron Offer, Richard Miu, MD [facility] None  04/24/2018  9:30 AM Ward Givens, NP [facility] None   Primary Care Physician: [name], MD Location: [facility] Note by: [name], MD Date: 03/12/2018; Time: 12:52 PM  Disclaimer:  Medicine is not an exact science. The only guarantee in medicine is that nothing is guaranteed. It is important to note that the decision to proceed with this intervention was based on the information collected from the patient. The Data and conclusions were drawn from the patient's questionnaire, the interview, and the physical examination. Because the information was provided in large part by the patient, it cannot be guaranteed that it has not been purposely or unconsciously manipulated. Every effort has been made to obtain as much relevant data as possible for this evaluation. It is important to note that the conclusions that lead to this procedure are derived in large part  from the available data. Always take into account that the treatment will also be dependent on availability of resources and existing treatment guidelines, considered by other Pain Management Practitioners as being common knowledge and practice, at the time of the intervention. For Medico-Legal purposes, it is also important to point out that variation in procedural techniques and pharmacological choices are the acceptable norm. The indications, contraindications, technique, and results of the above procedure should only be interpreted and judged by a Board-Certified Interventional Pain Specialist with extensive familiarity and expertise in the same exact procedure and technique.

## 2018-03-13 ENCOUNTER — Telehealth: Payer: Self-pay | Admitting: *Deleted

## 2018-03-13 NOTE — Telephone Encounter (Signed)
Having numbness and tingling in left leg. Dr. Holley Raring notified, instructed that this should disspate. Patient notified.

## 2018-03-22 ENCOUNTER — Other Ambulatory Visit: Payer: Self-pay | Admitting: Neurology

## 2018-04-08 ENCOUNTER — Other Ambulatory Visit: Payer: Self-pay

## 2018-04-08 ENCOUNTER — Encounter: Payer: Self-pay | Admitting: Student in an Organized Health Care Education/Training Program

## 2018-04-08 ENCOUNTER — Ambulatory Visit
Payer: BC Managed Care – PPO | Attending: Student in an Organized Health Care Education/Training Program | Admitting: Student in an Organized Health Care Education/Training Program

## 2018-04-08 VITALS — BP 111/79 | HR 72 | Temp 98.9°F | Resp 18 | Ht 62.5 in | Wt 265.0 lb

## 2018-04-08 DIAGNOSIS — Z79899 Other long term (current) drug therapy: Secondary | ICD-10-CM | POA: Insufficient documentation

## 2018-04-08 DIAGNOSIS — Z6841 Body Mass Index (BMI) 40.0 and over, adult: Secondary | ICD-10-CM | POA: Insufficient documentation

## 2018-04-08 DIAGNOSIS — I4891 Unspecified atrial fibrillation: Secondary | ICD-10-CM | POA: Insufficient documentation

## 2018-04-08 DIAGNOSIS — G8929 Other chronic pain: Secondary | ICD-10-CM

## 2018-04-08 DIAGNOSIS — R7303 Prediabetes: Secondary | ICD-10-CM | POA: Diagnosis not present

## 2018-04-08 DIAGNOSIS — G894 Chronic pain syndrome: Secondary | ICD-10-CM | POA: Diagnosis not present

## 2018-04-08 DIAGNOSIS — Z5181 Encounter for therapeutic drug level monitoring: Secondary | ICD-10-CM | POA: Diagnosis present

## 2018-04-08 DIAGNOSIS — K219 Gastro-esophageal reflux disease without esophagitis: Secondary | ICD-10-CM | POA: Insufficient documentation

## 2018-04-08 DIAGNOSIS — M7062 Trochanteric bursitis, left hip: Secondary | ICD-10-CM | POA: Insufficient documentation

## 2018-04-08 DIAGNOSIS — G40409 Other generalized epilepsy and epileptic syndromes, not intractable, without status epilepticus: Secondary | ICD-10-CM | POA: Insufficient documentation

## 2018-04-08 DIAGNOSIS — M47816 Spondylosis without myelopathy or radiculopathy, lumbar region: Secondary | ICD-10-CM | POA: Insufficient documentation

## 2018-04-08 DIAGNOSIS — M47818 Spondylosis without myelopathy or radiculopathy, sacral and sacrococcygeal region: Secondary | ICD-10-CM

## 2018-04-08 DIAGNOSIS — M5388 Other specified dorsopathies, sacral and sacrococcygeal region: Secondary | ICD-10-CM | POA: Insufficient documentation

## 2018-04-08 DIAGNOSIS — K432 Incisional hernia without obstruction or gangrene: Secondary | ICD-10-CM | POA: Insufficient documentation

## 2018-04-08 DIAGNOSIS — M533 Sacrococcygeal disorders, not elsewhere classified: Secondary | ICD-10-CM | POA: Diagnosis not present

## 2018-04-08 DIAGNOSIS — J4540 Moderate persistent asthma, uncomplicated: Secondary | ICD-10-CM | POA: Diagnosis not present

## 2018-04-08 DIAGNOSIS — Z8673 Personal history of transient ischemic attack (TIA), and cerebral infarction without residual deficits: Secondary | ICD-10-CM | POA: Diagnosis not present

## 2018-04-08 MED ORDER — OXYCODONE HCL 5 MG PO TABS: 5.0000 mg | ORAL_TABLET | Freq: Every day | ORAL | 0 refills | Status: DC | PRN

## 2018-04-08 MED ORDER — TIZANIDINE HCL 4 MG PO TABS: 4.0000 mg | ORAL_TABLET | Freq: Three times a day (TID) | ORAL | 2 refills | Status: DC | PRN

## 2018-04-08 MED ORDER — OXYCODONE HCL 5 MG PO TABS: 5.0000 mg | ORAL_TABLET | Freq: Every day | ORAL | 0 refills | Status: AC | PRN

## 2018-04-08 NOTE — Patient Instructions (Addendum)
Oxycodone 5 mg x 3 months to begin filling on 04/08/18 to last until 07/07/18 escribed to pharmacy.   Zanaflex 4 mg escribed to pharmacy   ____________________________________________________________________________________________  General Risks and Possible Complications  Patient Responsibilities: It is important that you read this as it is part of your informed consent. It is our duty to inform you of the risks and possible complications associated with treatments offered to you. It is your responsibility as a patient to read this and to ask questions about anything that is not clear or that you believe was not covered in this document.  Patient's Rights: You have the right to refuse treatment. You also have the right to change your mind, even after initially having agreed to have the treatment done. However, under this last option, if you wait until the last second to change your mind, you may be charged for the materials used up to that point.  Introduction: Medicine is not an Chief Strategy Officer. Everything in Medicine, including the lack of treatment(s), carries the potential for danger, harm, or loss (which is by definition: Risk). In Medicine, a complication is a secondary problem, condition, or disease that can aggravate an already existing one. All treatments carry the risk of possible complications. The fact that a side effects or complications occurs, does not imply that the treatment was conducted incorrectly. It must be clearly understood that these can happen even when everything is done following the highest safety standards.  No treatment: You can choose not to proceed with the proposed treatment alternative. The "PRO(s)" would include: avoiding the risk of complications associated with the therapy. The "CON(s)" would include: not getting any of the treatment benefits. These benefits fall under one of three categories: diagnostic; therapeutic; and/or palliative. Diagnostic benefits include:  getting information which can ultimately lead to improvement of the disease or symptom(s). Therapeutic benefits are those associated with the successful treatment of the disease. Finally, palliative benefits are those related to the decrease of the primary symptoms, without necessarily curing the condition (example: decreasing the pain from a flare-up of a chronic condition, such as incurable terminal cancer).  General Risks and Complications: These are associated to most interventional treatments. They can occur alone, or in combination. They fall under one of the following six (6) categories: no benefit or worsening of symptoms; bleeding; infection; nerve damage; allergic reactions; and/or death. 1. No benefits or worsening of symptoms: In Medicine there are no guarantees, only probabilities. No healthcare provider can ever guarantee that a medical treatment will work, they can only state the probability that it may. Furthermore, there is always the possibility that the condition may worsen, either directly, or indirectly, as a consequence of the treatment. 2. Bleeding: This is more common if the patient is taking a blood thinner, either prescription or over the counter (example: Goody Powders, Fish oil, Aspirin, Garlic, etc.), or if suffering a condition associated with impaired coagulation (example: Hemophilia, cirrhosis of the liver, low platelet counts, etc.). However, even if you do not have one on these, it can still happen. If you have any of these conditions, or take one of these drugs, make sure to notify your treating physician. 3. Infection: This is more common in patients with a compromised immune system, either due to disease (example: diabetes, cancer, human immunodeficiency virus [HIV], etc.), or due to medications or treatments (example: therapies used to treat cancer and rheumatological diseases). However, even if you do not have one on these, it can still happen. If  you have any of these  conditions, or take one of these drugs, make sure to notify your treating physician. 4. Nerve Damage: This is more common when the treatment is an invasive one, but it can also happen with the use of medications, such as those used in the treatment of cancer. The damage can occur to small secondary nerves, or to large primary ones, such as those in the spinal cord and brain. This damage may be temporary or permanent and it may lead to impairments that can range from temporary numbness to permanent paralysis and/or brain death. 5. Allergic Reactions: Any time a substance or material comes in contact with our body, there is the possibility of an allergic reaction. These can range from a mild skin rash (contact dermatitis) to a severe systemic reaction (anaphylactic reaction), which can result in death. 6. Death: In general, any medical intervention can result in death, most of the time due to an unforeseen complication. ____________________________________________________________________________________________  Sacroiliac (SI) Joint Injection Patient Information  Description: The sacroiliac joint connects the scrum (very low back and tailbone) to the ilium (a pelvic bone which also forms half of the hip joint).  Normally this joint experiences very little motion.  When this joint becomes inflamed or unstable low back and or hip and pelvis pain may result.  Injection of this joint with local anesthetics (numbing medicines) and steroids can provide diagnostic information and reduce pain.  This injection is performed with the aid of x-ray guidance into the tailbone area while you are lying on your stomach.   You may experience an electrical sensation down the leg while this is being done.  You may also experience numbness.  We also may ask if we are reproducing your normal pain during the injection.  Conditions which may be treated SI injection:   Low back, buttock, hip or leg pain  Preparation for the  Injection:  1. Do not eat any solid food or dairy products within 8 hours of your appointment.  2. You may drink clear liquids up to 3 hours before appointment.  Clear liquids include water, black coffee, juice or soda.  No milk or cream please. 3. You may take your regular medications, including pain medications with a sip of water before your appointment.  Diabetics should hold regular insulin (if take separately) and take 1/2 normal NPH dose the morning of the procedure.  Carry some sugar containing items with you to your appointment. 4. A driver must accompany you and be prepared to drive you home after your procedure. 5. Bring all of your current medications with you. 6. An IV may be inserted and sedation may be given at the discretion of the physician. 7. A blood pressure cuff, EKG and other monitors will often be applied during the procedure.  Some patients may need to have extra oxygen administered for a short period.  8. You will be asked to provide medical information, including your allergies, prior to the procedure.  We must know immediately if you are taking blood thinners (like Coumadin/Warfarin) or if you are allergic to IV iodine contrast (dye).  We must know if you could possible be pregnant.  Possible side effects:   Bleeding from needle site  Infection (rare, may require surgery)  Nerve injury (rare)  Numbness & tingling (temporary)  A brief convulsion or seizure  Light-headedness (temporary)  Pain at injection site (several days)  Decreased blood pressure (temporary)  Weakness in the leg (temporary)   Call if  you experience:   New onset weakness or numbness of an extremity below the injection site that last more than 8 hours.  Hives or difficulty breathing ( go to the emergency room)  Inflammation or drainage at the injection site  Any new symptoms which are concerning to you  Please note:  Although the local anesthetic injected can often make your  back/ hip/ buttock/ leg feel good for several hours after the injections, the pain will likely return.  It takes 3-7 days for steroids to work in the sacroiliac area.  You may not notice any pain relief for at least that one week.  If effective, we will often do a series of three injections spaced 3-6 weeks apart to maximally decrease your pain.  After the initial series, we generally will wait some months before a repeat injection of the same type.  If you have any questions, please call [phone] [facility]

## 2018-04-08 NOTE — Progress Notes (Signed)
Safety precautions to be maintained throughout the outpatient stay will include: orient to surroundings, keep bed in low position, maintain call bell within reach at all times, provide assistance with transfer out of bed and ambulation.  

## 2018-04-08 NOTE — Progress Notes (Signed)
Patient's Name: [name] [name]  MRN: [id number]  Referring Provider: [name], MD  DOB: [birth year]  PCP: [name] Pink, MD  DOS: 04/08/2018  Note by: [name] Santa, MD  Service setting: Ambulatory outpatient  Specialty: Interventional Pain Management  Location: [facility] (AMB) Pain Management Facility    Patient type: Established   Primary Reason(s) for Visit: Encounter for prescription drug management & post-procedure evaluation of chronic illness with mild to moderate exacerbation(Level of risk: moderate) CC: Hip Pain (left) and Leg Pain (left and below the knee through to toes-numbing at times)  HPI  [name] [name] is a 52 y.o. year old, female patient, who comes today for a post-procedure evaluation and medication management. She has Incisional hernia, without obstruction or gangrene; GERD (gastroesophageal reflux disease); Obesity, Class III, BMI 40-49.9 (morbid obesity) ([place]); Generalized convulsive epilepsy ([place]); History of recent fall; Paresthesia; CVA (cerebral infarction); Right sided weakness; Seizure disorder ([place]); Chronic headaches; Atrial fibrillation ([place]); Right arm weakness; Asthma, mild intermittent; TIA (transient ischemic attack); Morbid obesity ([place]); Intractable migraine with aura with status migrainosus; Chronic post-traumatic headache, not intractable; Migraine with aura and with status migrainosus, not intractable; Subarachnoid hemorrhage following injury, with loss of consciousness ([place]); TBI (traumatic brain injury) ([place]); Partial symptomatic epilepsy with complex partial seizures, not intractable, without status epilepticus ([place]); Headache; DDD (degenerative disc disease), lumbosacral; Lumbosacral radiculopathy; Surgical menopause on hormone replacement therapy; Incontinence in female; Status post TAH-BSO; Prediabetes; Vitamin D deficiency; Agitation; Pseudoseizure; Seizure-like activity ([place]); Seizures ([place]); Other parasomnia; Intractable migraine without aura and without status  migrainosus; Nocturnal seizures ([place]); Anxiety; Asthma; Cerebral artery occlusion with cerebral infarction ([place]); Generalized muscle weakness; History of CVA (cerebrovascular accident) without residual deficits; Other symptoms and signs involving the musculoskeletal system; Skin sensation disturbance; Migraine with aura; Migraines; Convulsions ([place]); Class 3 obesity in adult; Transient cerebral ischemia; Lumbar spondylosis; Chronic pain syndrome; Chronic upper back pain; Chest pain; Atypical chest pain; T wave inversion in EKG; Class 3 severe obesity due to excess calories with serious comorbidity and body mass index (BMI) of 45.0 to 49.9 in adult [facility]); Moderate persistent asthma; and Chronic migraine with aura on their problem list. Her primarily concern today is the Hip Pain (left) and Leg Pain (left and below the knee through to toes-numbing at times)  Pain Assessment: Location: Left Hip Radiating: left leg Onset: More than a month ago Duration: Chronic pain Quality: Aching, Dull Severity: 7 /10 (subjective, self-reported pain score)  Note: Reported level is compatible with observation.                         When using our objective Pain Scale, levels between 6 and 10/10 are said to belong in an emergency room, as it progressively worsens from a 6/10, described as severely limiting, requiring emergency care not usually available at an outpatient pain management facility. At a 6/10 level, communication becomes difficult and requires great effort. Assistance to reach the emergency department may be required. Facial flushing and profuse sweating along with potentially dangerous increases in heart rate and blood pressure will be evident. Effect on ADL:   Timing: Intermittent Modifying factors: procedues, medications, positioning, ice heat, rest BP: 111/79  HR: 72  [name] [name] was last seen on 03/13/2018 for a procedure. During today's appointment we reviewed [name] [name]'s post-procedure results, as  well as her outpatient medication regimen.  Further details on both, my assessment(s), as well as the proposed treatment plan, please see below.  Controlled Substance Pharmacotherapy Assessment  REMS (Risk Evaluation and Mitigation Strategy)  Analgesic: Oxycodone 5 mg daily prn MME/day: 7.3m/day.  [name] Rochester RN  04/08/2018  9:42 AM  Sign at close encounter Safety precautions to be maintained throughout the outpatient stay will include: orient to surroundings, keep bed in low position, maintain call bell within reach at all times, provide assistance with transfer out of bed and ambulation.    Pharmacokinetics: Liberation and absorption (onset of action): WNL Distribution (time to peak effect): WNL Metabolism and excretion (duration of action): WNL         Pharmacodynamics: Desired effects: Analgesia: [name] >50% benefit. Functional ability: Patient reports that medication allows her to accomplish basic ADLs Clinically meaningful improvement in function (CMIF): Sustained CMIF goals met Perceived effectiveness: Described as relatively effective, allowing for increase in activities of daily living (ADL) Undesirable effects: Side-effects or Adverse reactions: None reported Monitoring: [place] PMP: Online review of the past 186-montheriod conducted. Compliant with practice rules and regulations Last UDS on record: Summary  Date Value Ref Range Status  09/10/2017 FINAL  Final    Comment:    ==================================================================== TOXAS[facility]RE COMP DRUG ANALYSIS,UR ==================================================================== Test                             Result       Flag       Units Drug Present and Declared for Prescription Verification   Topiramate                     PRESENT      EXPECTED   Lamotrigine                    PRESENT      EXPECTED Drug Absent but Declared for Prescription Verification   Clonazepam                      Not Detected UNEXPECTED ng/mg creat   Gabapentin                     Not Detected UNEXPECTED   Tizanidine                     Not Detected UNEXPECTED    Tizanidine, as indicated in the declared medication list, is not    always detected even when used as directed.   Fluoxetine                     Not Detected UNEXPECTED   Trazodone                      Not Detected UNEXPECTED   Metoprolol                     Not Detected UNEXPECTED ==================================================================== Test                      Result    Flag   Units      Ref Range   Creatinine              161              mg/dL      >=20 ==================================================================== Declared Medications:  The flagging and interpretation on this report are based on the  following declared medications.  Unexpected results may arise from  inaccuracies  in the declared medications.  **Note: The testing scope of this panel includes these medications:  Clonazepam  Fluoxetine  Gabapentin  Lamotrigine  Metoprolol  Topiramate  Trazodone  **Note: The testing scope of this panel does not include small to  moderate amounts of these reported medications:  Tizanidine  **Note: The testing scope of this panel does not include following  reported medications:  Albuterol  Estradiol  Fremanezumab  Ondansetron  Ranitidine  Sumatriptan  Undefined Miscellaneous Drug  Vitamin D2 (Ergocalciferol) ==================================================================== For clinical consultation, please call [phone]. ====================================================================    UDS interpretation: Compliant          Medication Assessment Form: Reviewed. Patient indicates being compliant with therapy Treatment compliance: Compliant Risk Assessment Profile: Aberrant behavior: See prior evaluations. None observed or detected today Comorbid factors increasing risk of overdose: See  prior notes. No additional risks detected today Risk of substance use disorder (SUD): Low  ORT Scoring interpretation table:  Score <3 = Low Risk for SUD  Score between 4-7 = Moderate Risk for SUD  Score >8 = High Risk for Opioid Abuse   Risk Mitigation Strategies:  Patient Counseling: Covered Patient-Prescriber Agreement (PPA): Present and active  Notification to other healthcare providers: Done  Pharmacologic Plan: No change in therapy, at this time.             Post-Procedure Assessment  03/12/2018 Procedure: L SI J and L GTB bursa injection  Influential Factors: BMI: 47.70 kg/m Intra-procedural challenges: None observed.         Assessment challenges: None detected.              Reported side-effects: None.        Post-procedural adverse reactions or complications: None reported         Sedation: Please see nurses note. When no sedatives are used, the analgesic levels obtained are directly associated to the effectiveness of the local anesthetics. However, when sedation is provided, the level of analgesia obtained during the initial 1 hour following the intervention, is believed to be the result of a combination of factors. These factors may include, but are not limited to: 1. The effectiveness of the local anesthetics used. 2. The effects of the analgesic(s) and/or anxiolytic(s) used. 3. The degree of discomfort experienced by the patient at the time of the procedure. 4. The patients ability and reliability in recalling and recording the events. 5. The presence and influence of possible secondary gains and/or psychosocial factors. Reported result: Relief experienced during the 1st hour after the procedure: 100 % (Ultra-Short Term Relief)            Interpretative annotation: Clinically appropriate result. Analgesia during this period is likely to be Local Anesthetic and/or IV Sedative (Analgesic/Anxiolytic) related.          Effects of local anesthetic: The analgesic effects  attained during this period are directly associated to the localized infiltration of local anesthetics and therefore cary significant diagnostic value as to the etiological location, or anatomical origin, of the pain. Expected duration of relief is directly dependent on the pharmacodynamics of the local anesthetic used. Long-acting (4-6 hours) anesthetics used.  Reported result: Relief during the next 4 to 6 hour after the procedure: 100 % (Short-Term Relief)            Interpretative annotation: Clinically appropriate result. Analgesia during this period is likely to be Local Anesthetic-related.          Long-term benefit: Defined as the period of time  past the expected duration of local anesthetics (1 hour for short-acting and 4-6 hours for long-acting). With the possible exception of prolonged sympathetic blockade from the local anesthetics, benefits during this period are typically attributed to, or associated with, other factors such as analgesic sensory neuropraxia, antiinflammatory effects, or beneficial biochemical changes provided by agents other than the local anesthetics.  Reported result: Extended relief following procedure: 70 % (Long-Term Relief)            Interpretative annotation: Clinically possible results. Good relief. No permanent benefit expected. Inflammation plays a part in the etiology to the pain.          Current benefits: Defined as reported results that persistent at this point in time.   Analgesia: 50-75 %            Function: Somewhat improved ROM: Somewhat improved Interpretative annotation: Recurrence of symptoms. No permanent benefit expected. Effective diagnostic intervention.          Interpretation: Results would suggest a successful diagnostic intervention.                  Plan:  Repeat treatment or therapy and compare extent and duration of benefits.                Laboratory Chemistry  Inflammation Markers (CRP: Acute Phase) (ESR: Chronic Phase) Lab  Results  Component Value Date   LATICACIDVEN 0.54 09/05/2014                         Rheumatology Markers No results found for: RF, ANA, LABURIC, URICUR, LYMEIGGIGMAB, LYMEABIGMQN, HLAB27                      Renal Function Markers Lab Results  Component Value Date   BUN 16 11/20/2017   CREATININE 0.87 11/20/2017   BCR 14 11/13/2017   GFRAA >60 11/20/2017   GFRNONAA >60 11/20/2017                             Hepatic Function Markers Lab Results  Component Value Date   AST 16 11/20/2017   ALT 15 11/20/2017   ALBUMIN 3.4 (L) 11/20/2017   ALKPHOS 93 11/20/2017   AMYLASE 54 11/18/2012   LIPASE 11 11/23/2016                        Electrolytes Lab Results  Component Value Date   NA 140 11/20/2017   K 3.6 11/20/2017   CL 110 11/20/2017   CALCIUM 8.6 (L) 11/20/2017   MG 1.8 09/26/2014                        Neuropathy Markers Lab Results  Component Value Date   VITAMINB12 491 09/10/2016   FOLATE >20.0 09/10/2016   HGBA1C 5.5 10/09/2016   HIV Non Reactive 11/19/2017                        Bone Pathology Markers Lab Results  Component Value Date   VD25OH 23.5 (L) 10/09/2016   VD125OH2TOT 47 03/09/2015   [id number] 24 03/09/2015   [id number] 23 03/09/2015                         Coagulation Parameters Lab Results  Component Value Date   INR  1.04 02/05/2016   LABPROT 13.8 02/05/2016   APTT 35 02/05/2016   PLT 190 11/20/2017   DDIMER 1.20 (H) 11/19/2017                        Cardiovascular Markers Lab Results  Component Value Date   BNP 30.9 10/01/2014   CKTOTAL 101 05/29/2007   CKMB 1.2 05/29/2007   TROPONINI <0.03 11/20/2017   HGB 12.7 11/20/2017   HCT 38.7 11/20/2017                         CA Markers No results found for: CEA, CA125, LABCA2                      Note: Lab results reviewed.  Recent Diagnostic Imaging Results  DG C-Arm 1-60 Min-No Report Fluoroscopy was utilized by the requesting physician.  No radiographic  interpretation.    Complexity Note: Imaging results reviewed. Results shared with Ms. Broadwater, using Layman's terms.                         Meds   Current Outpatient Medications:  .  albuterol (PROVENTIL HFA;VENTOLIN HFA) 108 (90 BASE) MCG/ACT inhaler, Inhale 2 puffs into the lungs every 6 (six) hours as needed for wheezing or shortness of breath. , Disp: , Rfl:  .  albuterol (PROVENTIL) (2.5 MG/3ML) 0.083% nebulizer solution, Take 2.5 mg by nebulization every 6 (six) hours as needed for wheezing or shortness of breath., Disp: , Rfl:  .  cetirizine (ZYRTEC) 10 MG tablet, Take 10 mg by mouth daily as needed., Disp: , Rfl: 1 .  clonazePAM (KLONOPIN) 1 MG tablet, Take one at night prn , use after facette injections., Disp: 90 tablet, Rfl: 0 .  dicyclomine (BENTYL) 20 MG tablet, Take 1 tablet by mouth every 6 (six) hours as needed., Disp: , Rfl:  .  FLUoxetine (PROZAC) 40 MG capsule, Take 1 capsule (40 mg total) by mouth daily., Disp: 90 capsule, Rfl: 1 .  Fremanezumab-vfrm (AJOVY) 225 MG/1.5ML SOSY, Inject 225 mg every 30 (thirty) days into the skin., Disp: 1 Syringe, Rfl: 11 .  gabapentin (NEURONTIN) 300 MG capsule, TAKE 1 CAPSULE (300 MG TOTAL) AT BEDTIME BY MOUTH., Disp: 90 capsule, Rfl: 3 .  lacosamide (VIMPAT) 50 MG TABS tablet, Take 1 tablet (50 mg total) by mouth 2 (two) times daily., Disp: 60 tablet, Rfl: 5 .  lamoTRIgine (LAMICTAL) 150 MG tablet, Take 1 tablet (150 mg total) 2 (two) times daily by mouth., Disp: 180 tablet, Rfl: 3 .  omeprazole (PRILOSEC) 40 MG capsule, Take 40 mg by mouth daily., Disp: , Rfl:  .  ondansetron (ZOFRAN) 4 MG tablet, Take 1 tablet (4 mg total) by mouth every 8 (eight) hours as needed for nausea or vomiting., Disp: 10 tablet, Rfl: 0 .  oxyCODONE (OXY IR/ROXICODONE) 5 MG immediate release tablet, Take 1 tablet (5 mg total) by mouth daily as needed for severe pain., Disp: 30 tablet, Rfl: 0 .  ranitidine (ZANTAC) 300 MG tablet, Take 300 mg by mouth daily as needed for heartburn. ,  Disp: , Rfl:  .  SUMAtriptan (IMITREX) 50 MG tablet, Take 1 tablet (50 mg total) by mouth every 2 (two) hours as needed for migraine. May repeat in 2 hours if headache persists or recurs., Disp: 30 tablet, Rfl: 1 .  tiZANidine (ZANAFLEX) 4 MG tablet, Take 1 tablet (  4 mg total) by mouth every 8 (eight) hours as needed for muscle spasms., Disp: 90 tablet, Rfl: 2 .  topiramate (TOPAMAX) 100 MG tablet, Take 1 tablet (100 mg total) 2 (two) times daily by mouth., Disp: 180 tablet, Rfl: 3 .  traZODone (DESYREL) 50 MG tablet, Take 1-2 tablets at night for sleep, Disp: 180 tablet, Rfl: 1 .  Vitamin D, Ergocalciferol, (DRISDOL) 50000 units CAPS capsule, Take 1 capsule (50,000 Units total) by mouth every Tuesday., Disp: 30 capsule, Rfl: 0 .  [START ON 05/08/2018] oxyCODONE (OXY IR/ROXICODONE) 5 MG immediate release tablet, Take 1 tablet (5 mg total) by mouth daily as needed for severe pain., Disp: 30 tablet, Rfl: 0 .  [START ON 06/07/2018] oxyCODONE (OXY IR/ROXICODONE) 5 MG immediate release tablet, Take 1 tablet (5 mg total) by mouth daily as needed for severe pain., Disp: 30 tablet, Rfl: 0  Current Facility-Administered Medications:  .  Fremanezumab-vfrm SOSY 1.5 mL, 1.5 mL, Subcutaneous, Q30 days, Dohmeier, Asencion Partridge, MD  ROS  Constitutional: Denies any fever or chills Gastrointestinal: No reported hemesis, hematochezia, vomiting, or acute GI distress Musculoskeletal: Denies any acute onset joint swelling, redness, loss of ROM, or weakness Neurological: No reported episodes of acute onset apraxia, aphasia, dysarthria, agnosia, amnesia, paralysis, loss of coordination, or loss of consciousness  Allergies  Ms. Zappone is allergic to amoxicillin; penicillins; biaxin [clarithromycin]; erythromycin base; and zithromax [azithromycin].  PFSH  Drug: Ms. Dhingra  reports that she does not use drugs. Alcohol:  reports that she does not drink alcohol. Tobacco:  reports that she has never smoked. She has never used  smokeless tobacco. Medical:  has a past medical history of Anginal pain ([place]), Anxiety, Asthma, Atrial fibrillation ([place]), Chest pain, Chronic back pain, Chronic bronchitis ([place]), Complication of anesthesia, Daily headache, Epilepsy ([place]), Fibroid, GERD (gastroesophageal reflux disease), Head injury (1975; 2015), Joint pain, Migraine, Obesity, Class III, BMI 40-49.9 (morbid obesity) ([place]) (12/21/2012), Ovarian cyst, Pre-diabetes, Seizures ([place]), Shortness of breath dyspnea, TBI (traumatic brain injury) ([place]) (2015), TIA (transient ischemic attack) (04/2015?), and Vitamin D deficiency. Surgical: Ms. Kissel  has a past surgical history that includes Cesarean section (1989); Esophagogastroduodenoscopy (N/A, 11/21/2012); Colonoscopy (N/A, 11/21/2012); Incisional hernia repair (N/A, 12/17/2012); Insertion of mesh (N/A, 12/17/2012); Cardiac catheterization (N/A, 01/21/2015); Appendectomy (1990s); Hernia repair; Dilation and curettage of uterus (1985); Ovarian cyst removal (2007; 2014); Abdominal hysterectomy (2005); Colonoscopy with propofol (N/A, 09/02/2017); and Esophagogastroduodenoscopy (egd) with propofol (N/A, 09/02/2017). Family: family history includes Breast cancer in her cousin; COPD in her father; Depression in her mother; Diabetes in her father; Emphysema in her father; Heart attack in her father; Heart disease in her father and mother; Hypertension in her father and mother.  Constitutional Exam  General appearance: Well nourished, well developed, and well hydrated. In no apparent acute distress Vitals:   04/08/18 0942  BP: 111/79  Pulse: 72  Resp: 18  Temp: 98.9 F (37.2 C)  TempSrc: Oral  SpO2: 99%  Weight: 265 lb (120.2 kg)  Height: 5' 2.5" (1.588 m)   BMI Assessment: Estimated body mass index is 47.7 kg/m as calculated from the following:   Height as of this encounter: 5' 2.5" (1.588 m).   Weight as of this encounter: 265 lb (120.2 kg).  BMI interpretation table: BMI level Category Range  association with higher incidence of chronic pain  <18 kg/m2 Underweight   18.5-24.9 kg/m2 Ideal body weight   25-29.9 kg/m2 Overweight Increased incidence by 20%  30-34.9 kg/m2 Obese (Class I) Increased incidence by 68%  35-39.9 kg/m2 Severe obesity (Class II) Increased incidence by 136%  >40 kg/m2 Extreme obesity (Class III) Increased incidence by 254%   Patient's current BMI Ideal Body weight  Body mass index is 47.7 kg/m. Ideal body weight: 51.3 kg (112 lb 15.8 oz) Adjusted ideal body weight: 78.8 kg (173 lb 12.7 oz)   BMI Readings from Last 4 Encounters:  04/08/18 47.70 kg/m  03/12/18 49.32 kg/m  02/11/18 51.29 kg/m  02/03/18 50.48 kg/m   Wt Readings from Last 4 Encounters:  04/08/18 265 lb (120.2 kg)  03/12/18 274 lb (124.3 kg)  02/11/18 280 lb 6.4 oz (127.2 kg)  02/03/18 276 lb (125.2 kg)  Psych/Mental status: Alert, oriented x 3 (person, place, & time)       Eyes: PERLA Respiratory: No evidence of acute respiratory distress  Cervical Spine Area Exam  Skin & Axial Inspection: No masses, redness, edema, swelling, or associated skin lesions Alignment: Symmetrical Functional ROM: Unrestricted ROM      Stability: No instability detected Muscle Tone/Strength: Functionally intact. No obvious neuro-muscular anomalies detected. Sensory (Neurological): Unimpaired Palpation: No palpable anomalies              Upper Extremity (UE) Exam    Side: Right upper extremity  Side: Left upper extremity  Skin & Extremity Inspection: Skin color, temperature, and hair growth are WNL. No peripheral edema or cyanosis. No masses, redness, swelling, asymmetry, or associated skin lesions. No contractures.  Skin & Extremity Inspection: Skin color, temperature, and hair growth are WNL. No peripheral edema or cyanosis. No masses, redness, swelling, asymmetry, or associated skin lesions. No contractures.  Functional ROM: Unrestricted ROM          Functional ROM: Unrestricted ROM          Muscle  Tone/Strength: Functionally intact. No obvious neuro-muscular anomalies detected.  Muscle Tone/Strength: Functionally intact. No obvious neuro-muscular anomalies detected.  Sensory (Neurological): Unimpaired          Sensory (Neurological): Unimpaired          Palpation: No palpable anomalies              Palpation: No palpable anomalies              Provocative Test(s):  Phalen's test: deferred Tinel's test: deferred Apley's scratch test (touch opposite shoulder):  Action 1 (Across chest): deferred Action 2 (Overhead): deferred Action 3 (LB reach): deferred   Provocative Test(s):  Phalen's test: deferred Tinel's test: deferred Apley's scratch test (touch opposite shoulder):  Action 1 (Across chest): deferred Action 2 (Overhead): deferred Action 3 (LB reach): deferred    Thoracic Spine Area Exam  Skin & Axial Inspection: No masses, redness, or swelling Alignment: Symmetrical Functional ROM: Unrestricted ROM Stability: No instability detected Muscle Tone/Strength: Functionally intact. No obvious neuro-muscular anomalies detected. Sensory (Neurological): Unimpaired Muscle strength & Tone: No palpable anomalies Lumbar Spine Area Exam  Skin & Axial Inspection: No masses, redness, or swelling Alignment: Symmetrical Functional ROM: Unrestricted ROM       Stability: No instability detected Muscle Tone/Strength: Functionally intact. No obvious neuro-muscular anomalies detected. Sensory (Neurological): Musculoskeletal pain pattern Palpation: Complains of area being tender to palpation       Provocative Tests: Lumbar Hyperextension/rotation test: deferred today       Lumbar quadrant test (Kemp's test): deferred today       Lumbar Lateral bending test: deferred today       Patrick's Maneuver: deferred today  FABER test: deferred today       Thigh-thrust test: Positive for right-sided S-I arthralgia S-I compression test: Positive for left-sided S-I arthralgia S-I  distraction test: Positive for left-sided S-I arthralgia Tender to palpation overlying left greater trochanteric bursa Gait & Posture Assessment  Ambulation: Limited Gait: Antalgic gait (limping) Posture: Difficulty standing up straight, due to pain   Lower Extremity Exam    Side: Right lower extremity  Side: Left lower extremity  Stability: No instability observed          Stability: No instability observed          Skin & Extremity Inspection: Skin color, temperature, and hair growth are WNL. No peripheral edema or cyanosis. No masses, redness, swelling, asymmetry, or associated skin lesions. No contractures.  Skin & Extremity Inspection: Skin color, temperature, and hair growth are WNL. No peripheral edema or cyanosis. No masses, redness, swelling, asymmetry, or associated skin lesions. No contractures.  Functional ROM: Unrestricted ROM                  Functional ROM: Unrestricted ROM                  Muscle Tone/Strength: Functionally intact. No obvious neuro-muscular anomalies detected.  Muscle Tone/Strength: Functionally intact. No obvious neuro-muscular anomalies detected.  Sensory (Neurological): Unimpaired  Sensory (Neurological): Unimpaired  Palpation: No palpable anomalies  Palpation: No palpable anomalies    Assessment  Primary Diagnosis & Pertinent Problem List: The primary encounter diagnosis was Chronic left SI joint pain. Diagnoses of Trochanteric bursitis of left hip, SI joint arthritis, and Chronic pain syndrome were also pertinent to this visit.  Status Diagnosis  Responding Responding Responding 1. Chronic left SI joint pain   2. Trochanteric bursitis of left hip   3. SI joint arthritis   4. Chronic pain syndrome     General Recommendations: The pain condition that the patient suffers from is best treated with a multidisciplinary approach that involves an increase in physical activity to prevent de-conditioning and worsening of the pain cycle, as well as  psychological counseling (formal and/or informal) to address the co-morbid psychological affects of pain. Treatment will often involve judicious use of pain medications and interventional procedures to decrease the pain, allowing the patient to participate in the physical activity that will ultimately produce long-lasting pain reductions. The goal of the multidisciplinary approach is to return the patient to a higher level of overall function and to restore their ability to perform activities of daily living.   52 year old female with history of axial low back pain secondary to left SI joint arthritis and left greater trochanteric bursitis who presents for follow-up status post left SI joint injection and left greater trochanteric bursa injection on 03/12/2018.  Patient endorses moderate to significant benefit from these injections in regard to her axial low back hip and buttock pain.  Patient states that this injection has been the most helpful injection for her from all the injections that she has had in the past.  She notices a significant difference in her ability to walk and perform activities of daily living.  We will plan on repeating left SI joint injection as well as left greater trochanteric bursa injection under fluoroscopy with sedation.  We will also refill the patient's oxycodone and tizanidine as below.  Interventional history: -Status post 2 sets of diagnostic bilateral L3, L4, L5 facet medial branch nerve blocks which were not effective, defer lumbar RFA -Status post left SI joint injection and  left greater trochanteric bursa injection on 03/12/2018 which provided significant benefit in regards to left hip and buttock pain  Plan of Care  Pharmacotherapy (Medications Ordered): Meds ordered this encounter  Medications  . tiZANidine (ZANAFLEX) 4 MG tablet    Sig: Take 1 tablet (4 mg total) by mouth every 8 (eight) hours as needed for muscle spasms.    Dispense:  90 tablet    Refill:  2   . oxyCODONE (OXY IR/ROXICODONE) 5 MG immediate release tablet    Sig: Take 1 tablet (5 mg total) by mouth daily as needed for severe pain.    Dispense:  30 tablet    Refill:  0  . oxyCODONE (OXY IR/ROXICODONE) 5 MG immediate release tablet    Sig: Take 1 tablet (5 mg total) by mouth daily as needed for severe pain.    Dispense:  30 tablet    Refill:  0  . oxyCODONE (OXY IR/ROXICODONE) 5 MG immediate release tablet    Sig: Take 1 tablet (5 mg total) by mouth daily as needed for severe pain.    Dispense:  30 tablet    Refill:  0   Lab-work, procedure(s), and/or referral(s): Orders Placed This Encounter  Procedures  . SACROILIAC JOINT INJECTION  . Large Joint Injection/Arthrocentesis    Time Note: Greater than 50% of the 25 minute(s) of face-to-face time spent with Ms. Rudd, was spent in counseling/coordination of care regarding: Ms. Hakala primary cause of pain, the treatment plan, treatment alternatives, the risks and possible complications of proposed treatment, the results, interpretation and significance of  her recent diagnostic interventional treatment(s), realistic expectations, the goals of pain management (increased in functionality), the medication agreement and the patient's responsibilities when it comes to controlled substances.  Provider-requested follow-up: Return in about 2 weeks (around 04/22/2018) for Procedure.  Future Appointments  Date Time Provider [place]  04/15/2018  3:00 PM Archie Balboa, LCAS-A [facility] None  04/22/2018  8:00 AM Daron Offer, Richard Miu, MD [facility] None  04/24/2018  9:30 AM Ward Givens, NP [facility] None  05/05/2018  9:00 AM [name] Santa, MD [facility] None  07/08/2018  9:30 AM [name] Santa, MD [facility] None    Primary Care Physician: [name] Pink, MD Location: [facility] Note by: [name] [name], M.D Date: 04/08/2018; Time: 11:54 AM  Patient Instructions  Oxycodone 5 mg x 3 months to begin filling  on 04/08/18 to last until 07/07/18 escribed to pharmacy.   Zanaflex 4 mg escribed to pharmacy   ____________________________________________________________________________________________  General Risks and Possible Complications  Patient Responsibilities: It is important that you read this as it is part of your informed consent. It is our duty to inform you of the risks and possible complications associated with treatments offered to you. It is your responsibility as a patient to read this and to ask questions about anything that is not clear or that you believe was not covered in this document.  Patient's Rights: You have the right to refuse treatment. You also have the right to change your mind, even after initially having agreed to have the treatment done. However, under this last option, if you wait until the last second to change your mind, you may be charged for the materials used up to that point.  Introduction: Medicine is not an Chief Strategy Officer. Everything in Medicine, including the lack of treatment(s), carries the potential for danger, harm, or loss (which is by definition: Risk). In Medicine, a complication is a secondary problem, condition,  or disease that can aggravate an already existing one. All treatments carry the risk of possible complications. The fact that a side effects or complications occurs, does not imply that the treatment was conducted incorrectly. It must be clearly understood that these can happen even when everything is done following the highest safety standards.  No treatment: You can choose not to proceed with the proposed treatment alternative. The "PRO(s)" would include: avoiding the risk of complications associated with the therapy. The "CON(s)" would include: not getting any of the treatment benefits. These benefits fall under one of three categories: diagnostic; therapeutic; and/or palliative. Diagnostic benefits include: getting information which can ultimately  lead to improvement of the disease or symptom(s). Therapeutic benefits are those associated with the successful treatment of the disease. Finally, palliative benefits are those related to the decrease of the primary symptoms, without necessarily curing the condition (example: decreasing the pain from a flare-up of a chronic condition, such as incurable terminal cancer).  General Risks and Complications: These are associated to most interventional treatments. They can occur alone, or in combination. They fall under one of the following six (6) categories: no benefit or worsening of symptoms; bleeding; infection; nerve damage; allergic reactions; and/or death. 1. No benefits or worsening of symptoms: In Medicine there are no guarantees, only probabilities. No healthcare provider can ever guarantee that a medical treatment will work, they can only state the probability that it may. Furthermore, there is always the possibility that the condition may worsen, either directly, or indirectly, as a consequence of the treatment. 2. Bleeding: This is more common if the patient is taking a blood thinner, either prescription or over the counter (example: Goody Powders, Fish oil, Aspirin, Garlic, etc.), or if suffering a condition associated with impaired coagulation (example: Hemophilia, cirrhosis of the liver, low platelet counts, etc.). However, even if you do not have one on these, it can still happen. If you have any of these conditions, or take one of these drugs, make sure to notify your treating physician. 3. Infection: This is more common in patients with a compromised immune system, either due to disease (example: diabetes, cancer, human immunodeficiency virus [HIV], etc.), or due to medications or treatments (example: therapies used to treat cancer and rheumatological diseases). However, even if you do not have one on these, it can still happen. If you have any of these conditions, or take one of these drugs, make  sure to notify your treating physician. 4. Nerve Damage: This is more common when the treatment is an invasive one, but it can also happen with the use of medications, such as those used in the treatment of cancer. The damage can occur to small secondary nerves, or to large primary ones, such as those in the spinal cord and brain. This damage may be temporary or permanent and it may lead to impairments that can range from temporary numbness to permanent paralysis and/or brain death. 5. Allergic Reactions: Any time a substance or material comes in contact with our body, there is the possibility of an allergic reaction. These can range from a mild skin rash (contact dermatitis) to a severe systemic reaction (anaphylactic reaction), which can result in death. 6. Death: In general, any medical intervention can result in death, most of the time due to an unforeseen complication. ____________________________________________________________________________________________  Sacroiliac (SI) Joint Injection Patient Information  Description: The sacroiliac joint connects the scrum (very low back and tailbone) to the ilium (a pelvic bone which also forms half of  the hip joint).  Normally this joint experiences very little motion.  When this joint becomes inflamed or unstable low back and or hip and pelvis pain may result.  Injection of this joint with local anesthetics (numbing medicines) and steroids can provide diagnostic information and reduce pain.  This injection is performed with the aid of x-ray guidance into the tailbone area while you are lying on your stomach.   You may experience an electrical sensation down the leg while this is being done.  You may also experience numbness.  We also may ask if we are reproducing your normal pain during the injection.  Conditions which may be treated SI injection:   Low back, buttock, hip or leg pain  Preparation for the Injection:  1. Do not eat any solid food or  dairy products within 8 hours of your appointment.  2. You may drink clear liquids up to 3 hours before appointment.  Clear liquids include water, black coffee, juice or soda.  No milk or cream please. 3. You may take your regular medications, including pain medications with a sip of water before your appointment.  Diabetics should hold regular insulin (if take separately) and take 1/2 normal NPH dose the morning of the procedure.  Carry some sugar containing items with you to your appointment. 4. A driver must accompany you and be prepared to drive you home after your procedure. 5. Bring all of your current medications with you. 6. An IV may be inserted and sedation may be given at the discretion of the physician. 7. A blood pressure cuff, EKG and other monitors will often be applied during the procedure.  Some patients may need to have extra oxygen administered for a short period.  8. You will be asked to provide medical information, including your allergies, prior to the procedure.  We must know immediately if you are taking blood thinners (like Coumadin/Warfarin) or if you are allergic to IV iodine contrast (dye).  We must know if you could possible be pregnant.  Possible side effects:   Bleeding from needle site  Infection (rare, may require surgery)  Nerve injury (rare)  Numbness & tingling (temporary)  A brief convulsion or seizure  Light-headedness (temporary)  Pain at injection site (several days)  Decreased blood pressure (temporary)  Weakness in the leg (temporary)   Call if you experience:   New onset weakness or numbness of an extremity below the injection site that last more than 8 hours.  Hives or difficulty breathing ( go to the emergency room)  Inflammation or drainage at the injection site  Any new symptoms which are concerning to you  Please note:  Although the local anesthetic injected can often make your back/ hip/ buttock/ leg feel good for several  hours after the injections, the pain will likely return.  It takes 3-7 days for steroids to work in the sacroiliac area.  You may not notice any pain relief for at least that one week.  If effective, we will often do a series of three injections spaced 3-6 weeks apart to maximally decrease your pain.  After the initial series, we generally will wait some months before a repeat injection of the same type.  If you have any questions, please call [phone] [facility]

## 2018-04-15 ENCOUNTER — Ambulatory Visit (HOSPITAL_COMMUNITY): Payer: Self-pay | Admitting: Licensed Clinical Social Worker

## 2018-04-22 ENCOUNTER — Ambulatory Visit (HOSPITAL_COMMUNITY): Payer: BC Managed Care – PPO | Admitting: Psychiatry

## 2018-04-22 ENCOUNTER — Encounter (HOSPITAL_COMMUNITY): Payer: Self-pay | Admitting: Psychiatry

## 2018-04-22 DIAGNOSIS — F4312 Post-traumatic stress disorder, chronic: Secondary | ICD-10-CM

## 2018-04-22 DIAGNOSIS — S069X1S Unspecified intracranial injury with loss of consciousness of 30 minutes or less, sequela: Secondary | ICD-10-CM

## 2018-04-22 DIAGNOSIS — Z818 Family history of other mental and behavioral disorders: Secondary | ICD-10-CM | POA: Diagnosis not present

## 2018-04-22 MED ORDER — TRAZODONE HCL 100 MG PO TABS: 100.0000 mg | ORAL_TABLET | Freq: Every day | ORAL | 0 refills | Status: DC
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
## 2018-04-22 NOTE — Patient Instructions (Signed)
[email or web address] [address] [place], [place] [address] Phone - [phone] Fax - [phone]

## 2018-04-24 ENCOUNTER — Ambulatory Visit: Payer: BC Managed Care – PPO | Admitting: Adult Health

## 2018-05-05 ENCOUNTER — Ambulatory Visit
Admission: RE | Admit: 2018-05-05 | Discharge: 2018-05-05 | Disposition: A | Discharge status: Home / Self Care | Payer: BC Managed Care – PPO | Source: Ambulatory Visit | Attending: Student in an Organized Health Care Education/Training Program | Admitting: Student in an Organized Health Care Education/Training Program

## 2018-05-05 ENCOUNTER — Ambulatory Visit: Admission: RE | Admit: 2018-05-05 | Payer: BC Managed Care – PPO | Source: Ambulatory Visit

## 2018-05-05 ENCOUNTER — Other Ambulatory Visit: Payer: Self-pay

## 2018-05-05 ENCOUNTER — Other Ambulatory Visit: Payer: Self-pay | Admitting: Student in an Organized Health Care Education/Training Program

## 2018-05-05 ENCOUNTER — Encounter: Payer: Self-pay | Admitting: Student in an Organized Health Care Education/Training Program

## 2018-05-05 ENCOUNTER — Ambulatory Visit (HOSPITAL_BASED_OUTPATIENT_CLINIC_OR_DEPARTMENT_OTHER): Payer: BC Managed Care – PPO | Admitting: Student in an Organized Health Care Education/Training Program

## 2018-05-05 VITALS — BP 152/70 | HR 52 | Temp 97.7°F | Resp 18 | Ht 62.0 in | Wt 272.0 lb

## 2018-05-05 DIAGNOSIS — Z9889 Other specified postprocedural states: Secondary | ICD-10-CM | POA: Diagnosis not present

## 2018-05-05 DIAGNOSIS — M533 Sacrococcygeal disorders, not elsewhere classified: Secondary | ICD-10-CM | POA: Insufficient documentation

## 2018-05-05 DIAGNOSIS — M461 Sacroiliitis, not elsewhere classified: Secondary | ICD-10-CM

## 2018-05-05 DIAGNOSIS — G894 Chronic pain syndrome: Secondary | ICD-10-CM | POA: Insufficient documentation

## 2018-05-05 DIAGNOSIS — Z881 Allergy status to other antibiotic agents status: Secondary | ICD-10-CM | POA: Insufficient documentation

## 2018-05-05 DIAGNOSIS — M7062 Trochanteric bursitis, left hip: Secondary | ICD-10-CM | POA: Diagnosis not present

## 2018-05-05 DIAGNOSIS — Z90710 Acquired absence of both cervix and uterus: Secondary | ICD-10-CM | POA: Insufficient documentation

## 2018-05-05 DIAGNOSIS — Z880 Allergy status to penicillin: Secondary | ICD-10-CM | POA: Diagnosis not present

## 2018-05-05 DIAGNOSIS — R52 Pain, unspecified: Secondary | ICD-10-CM

## 2018-05-05 DIAGNOSIS — G8929 Other chronic pain: Secondary | ICD-10-CM

## 2018-05-05 DIAGNOSIS — M47818 Spondylosis without myelopathy or radiculopathy, sacral and sacrococcygeal region: Secondary | ICD-10-CM

## 2018-05-05 DIAGNOSIS — M545 Low back pain: Secondary | ICD-10-CM | POA: Diagnosis present

## 2018-05-05 MED ORDER — LIDOCAINE HCL 2 % IJ SOLN
INTRAMUSCULAR | Status: AC
  Filled 2018-05-05: qty 20

## 2018-05-05 MED ORDER — LACTATED RINGERS IV SOLN
1000.0000 mL | Freq: Once | INTRAVENOUS | Status: AC
  Administered 2018-05-05: 1000 mL via INTRAVENOUS

## 2018-05-05 MED ORDER — LIDOCAINE HCL (PF) 1 % IJ SOLN: 10.0000 mL | Freq: Once | INTRAMUSCULAR | Status: DC

## 2018-05-05 MED ORDER — FENTANYL CITRATE (PF) 100 MCG/2ML IJ SOLN
25.0000 ug | INTRAMUSCULAR | Status: DC | PRN
  Administered 2018-05-05: 50 ug via INTRAVENOUS

## 2018-05-05 MED ORDER — ROPIVACAINE HCL 2 MG/ML IJ SOLN
10.0000 mL | Freq: Once | INTRAMUSCULAR | Status: AC
  Administered 2018-05-05: 10 mL

## 2018-05-05 MED ORDER — FENTANYL CITRATE (PF) 100 MCG/2ML IJ SOLN
INTRAMUSCULAR | Status: AC
  Filled 2018-05-05: qty 2

## 2018-05-05 MED ORDER — ROPIVACAINE HCL 2 MG/ML IJ SOLN
INTRAMUSCULAR | Status: AC
  Filled 2018-05-05: qty 10

## 2018-05-05 MED ORDER — DEXAMETHASONE SODIUM PHOSPHATE 10 MG/ML IJ SOLN
10.0000 mg | Freq: Once | INTRAMUSCULAR | Status: AC
  Administered 2018-05-05: 10 mg

## 2018-05-05 MED ORDER — DEXAMETHASONE SODIUM PHOSPHATE 10 MG/ML IJ SOLN
INTRAMUSCULAR | Status: AC
  Filled 2018-05-05: qty 1

## 2018-05-05 NOTE — Progress Notes (Signed)
Safety precautions to be maintained throughout the outpatient stay will include: orient to surroundings, keep bed in low position, maintain call bell within reach at all times, provide assistance with transfer out of bed and ambulation.  

## 2018-05-05 NOTE — Patient Instructions (Signed)

## 2018-05-05 NOTE — Progress Notes (Signed)
Patient's Name: [name] [name]  MRN: [id number]  Referring Provider: [name], MD  DOB: [birth year]  PCP: [name], MD  DOS: 05/05/2018  Note by: [name], MD  Service setting: Ambulatory outpatient  Specialty: Interventional Pain Management  Patient type: Established  Location: [facility] (AMB) Pain Management Facility  Visit type: Interventional Procedure   Primary Reason for Visit: Interventional Pain Management Treatment. CC: Back Pain (lower)  Procedure #1:  Anesthesia, Analgesia, Anxiolysis:  Type: Therapeutic Sacroiliac Joint Steroid Injection #2  Region: Inferior Lumbosacral Region Level: PIIS (Posterior Inferior Iliac Spine) Laterality: Left-Sided  Type: Moderate (Conscious) Sedation combined with Local Anesthesia Indication(s): Analgesia and Anxiety Route: Intravenous (IV) IV Access: Secured Sedation: Meaningful verbal contact was maintained at all times during the procedure  Local Anesthetic: Lidocaine 1%   Indications: 1. Trochanteric bursitis of left hip   2. Chronic left SI joint pain   3. SI joint arthritis   4. Chronic pain syndrome    Pain Score: Pre-procedure: 6 /10 Post-procedure: 0-No pain/10  Pre-op Assessment:  [name] [name] is a 52 y.o. (year old), female patient, seen today for interventional treatment. She  has a past surgical history that includes Cesarean section (1989); Esophagogastroduodenoscopy (N/A, 11/21/2012); Colonoscopy (N/A, 11/21/2012); Incisional hernia repair (N/A, 12/17/2012); Insertion of mesh (N/A, 12/17/2012); Cardiac catheterization (N/A, 01/21/2015); Appendectomy (1990s); Hernia repair; Dilation and curettage of uterus (1985); Ovarian cyst removal (2007; 2014); Abdominal hysterectomy (2005); Colonoscopy with propofol (N/A, 09/02/2017); and Esophagogastroduodenoscopy (egd) with propofol (N/A, 09/02/2017). [name] [name] has a current medication list which includes the following prescription(s): albuterol, albuterol, cetirizine, clonazepam, dicyclomine,  fluoxetine, fremanezumab-vfrm, gabapentin, lamotrigine, omeprazole, ondansetron, oxycodone, ranitidine, sumatriptan, tizanidine, topiramate, trazodone, vitamin d (ergocalciferol), lacosamide, and oxycodone, and the following Facility-Administered Medications: fentanyl, fremanezumab-vfrm, and lidocaine (pf). Her primarily concern today is the Back Pain (lower)  Initial Vital Signs:  Pulse/HCG Rate: 60ECG Heart Rate: 63 Temp: 98.6 F (37 C) Resp: 16 BP: 128/84 SpO2: 99 %  BMI: Estimated body mass index is 49.75 kg/m as calculated from the following:   Height as of this encounter: 5\' 2"  (1.575 m).   Weight as of this encounter: 272 lb (123.4 kg).  Risk Assessment: Allergies: Reviewed. She is allergic to amoxicillin; penicillins; biaxin [clarithromycin]; erythromycin base; and zithromax [azithromycin].  Allergy Precautions: None required Coagulopathies: Reviewed. None identified.  Blood-thinner therapy: None at this time Active Infection(s): Reviewed. None identified. [name] [name] is afebrile  Site Confirmation: [name] [name] was asked to confirm the procedure and laterality before marking the site Procedure checklist: Completed Consent: Before the procedure and under the influence of no sedative(s), amnesic(s), or anxiolytics, the patient was informed of the treatment options, risks and possible complications. To fulfill our ethical and legal obligations, as recommended by the American Medical Association's Code of Ethics, I have informed the patient of my clinical impression; the nature and purpose of the treatment or procedure; the risks, benefits, and possible complications of the intervention; the alternatives, including doing nothing; the risk(s) and benefit(s) of the alternative treatment(s) or procedure(s); and the risk(s) and benefit(s) of doing nothing. The patient was provided information about the general risks and possible complications associated with the procedure. These may include,  but are not limited to: failure to achieve desired goals, infection, bleeding, organ or nerve damage, allergic reactions, paralysis, and death. In addition, the patient was informed of those risks and complications associated to the procedure, such as failure to decrease pain; infection; bleeding; organ or nerve damage with subsequent damage to sensory, motor,  and/or autonomic systems, resulting in permanent pain, numbness, and/or weakness of one or several areas of the body; allergic reactions; (i.e.: anaphylactic reaction); and/or death. Furthermore, the patient was informed of those risks and complications associated with the medications. These include, but are not limited to: allergic reactions (i.e.: anaphylactic or anaphylactoid reaction(s)); adrenal axis suppression; blood sugar elevation that in diabetics may result in ketoacidosis or comma; water retention that in patients with history of congestive heart failure may result in shortness of breath, pulmonary edema, and decompensation with resultant heart failure; weight gain; swelling or edema; medication-induced neural toxicity; particulate matter embolism and blood vessel occlusion with resultant organ, and/or nervous system infarction; and/or aseptic necrosis of one or more joints. Finally, the patient was informed that Medicine is not an exact science; therefore, there is also the possibility of unforeseen or unpredictable risks and/or possible complications that may result in a catastrophic outcome. The patient indicated having understood very clearly. We have given the patient no guarantees and we have made no promises. Enough time was given to the patient to ask questions, all of which were answered to the patient's satisfaction. [name] [name] has indicated that she wanted to continue with the procedure. Attestation: I, the ordering provider, attest that I have discussed with the patient the benefits, risks, side-effects, alternatives, likelihood of  achieving goals, and potential problems during recovery for the procedure that I have provided informed consent. Date  Time: 05/05/2018  8:53 AM  Pre-Procedure Preparation:  Monitoring: As per clinic protocol. Respiration, ETCO2, SpO2, BP, heart rate and rhythm monitor placed and checked for adequate function Safety Precautions: Patient was assessed for positional comfort and pressure points before starting the procedure. Time-out: I initiated and conducted the "Time-out" before starting the procedure, as per protocol. The patient was asked to participate by confirming the accuracy of the "Time Out" information. Verification of the correct person, site, and procedure were performed and confirmed by me, the nursing staff, and the patient. "Time-out" conducted as per Joint Commission's Universal Protocol (UP.01.01.01). Time: 6301  Description of Procedure:          Position: Prone Target Area: Inferior, posterior, aspect of the sacroiliac fissure Approach: Posterior, paraspinal, ipsilateral approach. Area Prepped: Entire Lower Lumbosacral Region Prepping solution: ChloraPrep (2% chlorhexidine gluconate and 70% isopropyl alcohol) Safety Precautions: Aspiration looking for blood return was conducted prior to all injections. At no point did we inject any substances, as a needle was being advanced. No attempts were made at seeking any paresthesias. Safe injection practices and needle disposal techniques used. Medications properly checked for expiration dates. SDV (single dose vial) medications used. Description of the Procedure: Protocol guidelines were followed. The patient was placed in position over the procedure table. The target area was identified and the area prepped in the usual manner. Skin & deeper tissues infiltrated with local anesthetic. Appropriate amount of time allowed to pass for local anesthetics to take effect. The procedure needle was advanced under fluoroscopic guidance into the  sacroiliac joint until a firm endpoint was obtained. Proper needle placement secured. Negative aspiration confirmed. Solution injected in intermittent fashion, asking for systemic symptoms every 0.5cc of injectate. The needles were then removed and the area cleansed, making sure to leave some of the prepping solution back to take advantage of its long term bactericidal properties. Vitals:   05/05/18 0958 05/05/18 1008 05/05/18 1018 05/05/18 1028  BP: 135/61 (!) 158/72 (!) 142/64 (!) 152/70  Pulse:  (!) 50 (!) 51 (!) 52  Resp:  18 16 16 18   Temp:  97.7 F (36.5 C)    TempSrc:  Temporal    SpO2: 96% 97% 97% 97%  Weight:      Height:        Start Time: 0944 hrs. End Time: 0955 hrs. Materials:  Needle(s) Type: Regular needle Gauge: 22G Length: 3.5-in Medication(s): Please see orders for medications and dosing details. 5 cc solution made of 4 cc of 0.2% ropivacaine, 1 cc of Decadron 10 mg/cc.  2.5 cc injected intra-articular, 2.5 cc injected periarticular Imaging Guidance (Non-Spinal):          Type of Imaging Technique: Fluoroscopy Guidance (Non-Spinal) Indication(s): Assistance in needle guidance and placement for procedures requiring needle placement in or near specific anatomical locations not easily accessible without such assistance. Exposure Time: Please see nurses notes. Contrast: None used. Fluoroscopic Guidance: I was personally present during the use of fluoroscopy. "Tunnel Vision Technique" used to obtain the best possible view of the target area. Parallax error corrected before commencing the procedure. "Direction-depth-direction" technique used to introduce the needle under continuous pulsed fluoroscopy. Once target was reached, antero-posterior, oblique, and lateral fluoroscopic projection used confirm needle placement in all planes. Images permanently stored in EMR. Interpretation: No contrast injected.  Antibiotic Prophylaxis:   Anti-infectives (From admission, onward)    None     Indication(s): None identified  Post-operative Assessment:  Post-procedure Vital Signs:  Pulse/HCG Rate: (!) 5260 Temp: 97.7 F (36.5 C) Resp: 18 BP: (!) 152/70 SpO2: 97 %  EBL: None  Complications: No immediate post-treatment complications observed by team, or reported by patient.  Note: The patient tolerated the entire procedure well. A repeat set of vitals were taken after the procedure and the patient was kept under observation following institutional policy, for this type of procedure. Post-procedural neurological assessment was performed, showing return to baseline, prior to discharge. The patient was provided with post-procedure discharge instructions, including a section on how to identify potential problems. Should any problems arise concerning this procedure, the patient was given instructions to immediately contact us, at any time, without hesitation. In any case, we plan to contact the patient by telephone for a follow-up status report regarding this interventional procedure.  Comments:  No additional relevant information.  Plan of Care   Imaging Orders  No imaging studies ordered today   Procedure Orders    No procedure(s) ordered today    Medications ordered for procedure: Meds ordered this encounter  Medications  . lactated ringers infusion 1,000 mL  . fentaNYL (SUBLIMAZE) injection 25-100 mcg    Make sure Narcan is available in the pyxis when using this medication. In the event of respiratory depression (RR< 8/min): Titrate NARCAN (naloxone) in increments of 0.1 to 0.2 mg IV at 2-3 minute intervals, until desired degree of reversal.  . lidocaine (PF) (XYLOCAINE) 1 % injection 10 mL  . ropivacaine (PF) 2 mg/mL (0.2%) (NAROPIN) injection 10 mL  . dexamethasone (DECADRON) injection 10 mg   Medications administered: We administered lactated ringers, fentaNYL, ropivacaine (PF) 2 mg/mL (0.2%), and dexamethasone.  See the medical record for exact dosing,  route, and time of administration.  New Prescriptions   No medications on file   Disposition: Discharge home  Discharge Date & Time: 05/05/2018; 1028 hrs.   Physician-requested Follow-up: Return in about 4 weeks (around 06/02/2018) for Post Procedure Evaluation.  Future Appointments  Date Time Provider [place]  06/03/2018 10:00 AM [name], MD [facility] None  07/08/2018  9:30 AM [name],  MD [facility] None  07/29/2018  7:30 AM Ward Givens, NP [facility] None   Primary Care Physician: [name], MD Location: [facility] Note by: [name], MD Date: 05/05/2018; Time: 1:39 PM  Disclaimer:  Medicine is not an exact science. The only guarantee in medicine is that nothing is guaranteed. It is important to note that the decision to proceed with this intervention was based on the information collected from the patient. The Data and conclusions were drawn from the patient's questionnaire, the interview, and the physical examination. Because the information was provided in large part by the patient, it cannot be guaranteed that it has not been purposely or unconsciously manipulated. Every effort has been made to obtain as much relevant data as possible for this evaluation. It is important to note that the conclusions that lead to this procedure are derived in large part from the available data. Always take into account that the treatment will also be dependent on availability of resources and existing treatment guidelines, considered by other Pain Management Practitioners as being common knowledge and practice, at the time of the intervention. For Medico-Legal purposes, it is also important to point out that variation in procedural techniques and pharmacological choices are the acceptable norm. The indications, contraindications, technique, and results of the above procedure should only be interpreted and judged by a Board-Certified Interventional Pain  Specialist with extensive familiarity and expertise in the same exact procedure and technique.

## 2018-05-05 NOTE — Progress Notes (Signed)
Patient's Name: [name] [name]  MRN: [id number]  Referring Provider: [name], MD  DOB: 1966/06/11  PCP: [name], MD  DOS: 05/05/2018  Note by: [name], MD  Service setting: Ambulatory outpatient  Specialty: Interventional Pain Management  Patient type: Established  Location: [facility] (AMB) Pain Management Facility  Visit type: Interventional Procedure   Primary Reason for Visit: Interventional Pain Management Treatment. CC: Back Pain (lower)  Procedure #2:  Anesthesia, Analgesia, Anxiolysis:  Type: Gluteofemoral Bursa Injection #2  Primary Purpose: Diagnostic/Therapeutric Region: Upper (proximal) Femoral Region Level: Hip Joint Target Area: Superior aspect of the hip joint cavity, going thru the superior portion of the capsular ligament. Approach: Posterolateral approach Laterality: Left Position: Prone  Type: Local Anesthesia and IV Sedation Indication(s): Analgesia         Local Anesthetic: Lidocaine 1% Route: Infiltration ([place]/IM) IV Access: Secured Sedation: Meaningful verbal contact was maintained at all times during the procedure    Indications: 1. Trochanteric bursitis of left hip   2. Chronic left SI joint pain   3. SI joint arthritis   4. Chronic pain syndrome    Pain Score: Pre-procedure: 6 /10 Post-procedure: 0-No pain/10  Pre-op Assessment:  [name] [name] is a 52 y.o. (year old), female patient, seen today for interventional treatment. She  has a past surgical history that includes Cesarean section (1989); Esophagogastroduodenoscopy (N/A, 11/21/2012); Colonoscopy (N/A, 11/21/2012); Incisional hernia repair (N/A, 12/17/2012); Insertion of mesh (N/A, 12/17/2012); Cardiac catheterization (N/A, 01/21/2015); Appendectomy (1990s); Hernia repair; Dilation and curettage of uterus (1985); Ovarian cyst removal (2007; 2014); Abdominal hysterectomy (2005); Colonoscopy with propofol (N/A, 09/02/2017); and Esophagogastroduodenoscopy (egd) with propofol (N/A, 09/02/2017). [name] [name]  has a current medication list which includes the following prescription(s): albuterol, albuterol, cetirizine, clonazepam, dicyclomine, fluoxetine, fremanezumab-vfrm, gabapentin, lamotrigine, omeprazole, ondansetron, oxycodone, ranitidine, sumatriptan, tizanidine, topiramate, trazodone, vitamin d (ergocalciferol), lacosamide, and oxycodone, and the following Facility-Administered Medications: fentanyl, fremanezumab-vfrm, and lidocaine (pf). Her primarily concern today is the Back Pain (lower)  Initial Vital Signs:  Pulse/HCG Rate: 60ECG Heart Rate: 63 Temp: 98.6 F (37 C) Resp: 16 BP: 128/84 SpO2: 99 %  BMI: Estimated body mass index is 49.75 kg/m as calculated from the following:   Height as of this encounter: 5\' 2"  (1.575 m).   Weight as of this encounter: 272 lb (123.4 kg).  Risk Assessment: Allergies: Reviewed. She is allergic to amoxicillin; penicillins; biaxin [clarithromycin]; erythromycin base; and zithromax [azithromycin].  Allergy Precautions: None required Coagulopathies: Reviewed. None identified.  Blood-thinner therapy: None at this time Active Infection(s): Reviewed. None identified. [name] [name] is afebrile  Site Confirmation: [name] [name] was asked to confirm the procedure and laterality before marking the site Procedure checklist: Completed Consent: Before the procedure and under the influence of no sedative(s), amnesic(s), or anxiolytics, the patient was informed of the treatment options, risks and possible complications. To fulfill our ethical and legal obligations, as recommended by the American Medical Association's Code of Ethics, I have informed the patient of my clinical impression; the nature and purpose of the treatment or procedure; the risks, benefits, and possible complications of the intervention; the alternatives, including doing nothing; the risk(s) and benefit(s) of the alternative treatment(s) or procedure(s); and the risk(s) and benefit(s) of doing nothing. The  patient was provided information about the general risks and possible complications associated with the procedure. These may include, but are not limited to: failure to achieve desired goals, infection, bleeding, organ or nerve damage, allergic reactions, paralysis, and death. In addition, the patient was informed of  those risks and complications associated to the procedure, such as failure to decrease pain; infection; bleeding; organ or nerve damage with subsequent damage to sensory, motor, and/or autonomic systems, resulting in permanent pain, numbness, and/or weakness of one or several areas of the body; allergic reactions; (i.e.: anaphylactic reaction); and/or death. Furthermore, the patient was informed of those risks and complications associated with the medications. These include, but are not limited to: allergic reactions (i.e.: anaphylactic or anaphylactoid reaction(s)); adrenal axis suppression; blood sugar elevation that in diabetics may result in ketoacidosis or comma; water retention that in patients with history of congestive heart failure may result in shortness of breath, pulmonary edema, and decompensation with resultant heart failure; weight gain; swelling or edema; medication-induced neural toxicity; particulate matter embolism and blood vessel occlusion with resultant organ, and/or nervous system infarction; and/or aseptic necrosis of one or more joints. Finally, the patient was informed that Medicine is not an exact science; therefore, there is also the possibility of unforeseen or unpredictable risks and/or possible complications that may result in a catastrophic outcome. The patient indicated having understood very clearly. We have given the patient no guarantees and we have made no promises. Enough time was given to the patient to ask questions, all of which were answered to the patient's satisfaction. [name] [name] has indicated that she wanted to continue with the procedure. Attestation:  I, the ordering provider, attest that I have discussed with the patient the benefits, risks, side-effects, alternatives, likelihood of achieving goals, and potential problems during recovery for the procedure that I have provided informed consent. Date  Time: 05/05/2018  8:40 AM  Pre-Procedure Preparation:  Monitoring: As per clinic protocol. Respiration, ETCO2, SpO2, BP, heart rate and rhythm monitor placed and checked for adequate function Safety Precautions: Patient was assessed for positional comfort and pressure points before starting the procedure. Time-out: I initiated and conducted the "Time-out" before starting the procedure, as per protocol. The patient was asked to participate by confirming the accuracy of the "Time Out" information. Verification of the correct person, site, and procedure were performed and confirmed by me, the nursing staff, and the patient. "Time-out" conducted as per Joint Commission's Universal Protocol (UP.01.01.01). Time: 0944  Description of Procedure:          Area Prepped: Entire Posterolateral hip area. Prepping solution: ChloraPrep (2% chlorhexidine gluconate and 70% isopropyl alcohol) Safety Precautions: Aspiration looking for blood return was conducted prior to all injections. At no point did we inject any substances, as a needle was being advanced. No attempts were made at seeking any paresthesias. Safe injection practices and needle disposal techniques used. Medications properly checked for expiration dates. SDV (single dose vial) medications used. Description of the Procedure: Protocol guidelines were followed. The patient was placed in position over the procedure table. The target area was identified and the area prepped in the usual manner. Skin & deeper tissues infiltrated with local anesthetic. Appropriate amount of time allowed to pass for local anesthetics to take effect. The procedure needles were then advanced to the target area. Proper needle placement  secured. Negative aspiration confirmed. Solution injected in intermittent fashion, asking for systemic symptoms every 0.5cc of injectate. The needles were then removed and the area cleansed, making sure to leave some of the prepping solution back to take advantage of its long term bactericidal properties. Vitals:   05/05/18 0958 05/05/18 1008 05/05/18 1018 05/05/18 1028  BP: 135/61 (!) 158/72 (!) 142/64 (!) 152/70  Pulse:  (!) 50 (!) 51 (!) 52  Resp: 18 16 16 18   Temp:  97.7 F (36.5 C)    TempSrc:  Temporal    SpO2: 96% 97% 97% 97%  Weight:      Height:        Start Time: 0944 hrs. End Time: 0955 hrs.  Materials:  Needle(s) Type: Regular needle Gauge: 22G Length: 3.5-in Medication(s): Please see orders for medications and dosing details. 5 cc solution made of 4 cc of 0.2% ropivacaine, 1 cc of Decadron 10 mg/cc. Imaging Guidance (Non-Spinal):          Type of Imaging Technique: Fluoroscopy Guidance (Non-Spinal) Indication(s): Assistance in needle guidance and placement for procedures requiring needle placement in or near specific anatomical locations not easily accessible without such assistance. Exposure Time: Please see nurses notes. Contrast: None used. Fluoroscopic Guidance: I was personally present during the use of fluoroscopy. "Tunnel Vision Technique" used to obtain the best possible view of the target area. Parallax error corrected before commencing the procedure. "Direction-depth-direction" technique used to introduce the needle under continuous pulsed fluoroscopy. Once target was reached, antero-posterior, oblique, and lateral fluoroscopic projection used confirm needle placement in all planes. Images permanently stored in EMR. Interpretation: No contrast injected.  Antibiotic Prophylaxis:   Anti-infectives (From admission, onward)   None     Indication(s): None identified  Post-operative Assessment:  Post-procedure Vital Signs:  Pulse/HCG Rate: (!) 5260 Temp: 97.7  F (36.5 C) Resp: 18 BP: (!) 152/70 SpO2: 97 %  EBL: None  Complications: No immediate post-treatment complications observed by team, or reported by patient.  Note: The patient tolerated the entire procedure well. A repeat set of vitals were taken after the procedure and the patient was kept under observation following institutional policy, for this type of procedure. Post-procedural neurological assessment was performed, showing return to baseline, prior to discharge. The patient was provided with post-procedure discharge instructions, including a section on how to identify potential problems. Should any problems arise concerning this procedure, the patient was given instructions to immediately contact us, at any time, without hesitation. In any case, we plan to contact the patient by telephone for a follow-up status report regarding this interventional procedure.  Comments:  No additional relevant information. 5 out of 5 strength bilateral lower extremity: Plantar flexion, dorsiflexion, knee flexion, knee extension.  Plan of Care   Imaging Orders  No imaging studies ordered today   Procedure Orders    No procedure(s) ordered today    Medications ordered for procedure: Meds ordered this encounter  Medications  . lactated ringers infusion 1,000 mL  . fentaNYL (SUBLIMAZE) injection 25-100 mcg    Make sure Narcan is available in the pyxis when using this medication. In the event of respiratory depression (RR< 8/min): Titrate NARCAN (naloxone) in increments of 0.1 to 0.2 mg IV at 2-3 minute intervals, until desired degree of reversal.  . lidocaine (PF) (XYLOCAINE) 1 % injection 10 mL  . ropivacaine (PF) 2 mg/mL (0.2%) (NAROPIN) injection 10 mL  . dexamethasone (DECADRON) injection 10 mg   Medications administered: We administered lactated ringers, fentaNYL, ropivacaine (PF) 2 mg/mL (0.2%), and dexamethasone.  See the medical record for exact dosing, route, and time of  administration.  New Prescriptions   No medications on file   Disposition: Discharge home  Discharge Date & Time: 05/05/2018; 1028 hrs.   Physician-requested Follow-up: Return in about 4 weeks (around 06/02/2018) for Post Procedure Evaluation.  Future Appointments  Date Time Provider [place]  06/03/2018 10:00 AM [name], MD [facility]  None  07/08/2018  9:30 AM [name], MD [facility] None  07/29/2018  7:30 AM Ward Givens, NP [facility] None   Primary Care Physician: [name], MD Location: [facility] Outpatient [facility] Management Facility Note by: [name], MD Date: 05/05/2018; Time: 1:40 PM  Disclaimer:  Medicine is not an exact science. The only guarantee in medicine is that nothing is guaranteed. It is important to note that the decision to proceed with this intervention was based on the information collected from the patient. The Data and conclusions were drawn from the patient's questionnaire, the interview, and the physical examination. Because the information was provided in large part by the patient, it cannot be guaranteed that it has not been purposely or unconsciously manipulated. Every effort has been made to obtain as much relevant data as possible for this evaluation. It is important to note that the conclusions that lead to this procedure are derived in large part from the available data. Always take into account that the treatment will also be dependent on availability of resources and existing treatment guidelines, considered by other Pain Management Practitioners as being common knowledge and practice, at the time of the intervention. For Medico-Legal purposes, it is also important to point out that variation in procedural techniques and pharmacological choices are the acceptable norm. The indications, contraindications, technique, and results of the above procedure should only be interpreted and judged by a Board-Certified Interventional Pain Specialist with  extensive familiarity and expertise in the same exact procedure and technique.

## 2018-05-06 ENCOUNTER — Telehealth: Payer: Self-pay

## 2018-05-06 NOTE — Telephone Encounter (Signed)
No answer. Message left on AM to call if needed.

## 2018-05-07 ENCOUNTER — Other Ambulatory Visit: Payer: Self-pay | Admitting: Neurology

## 2018-05-27 DIAGNOSIS — Z0271 Encounter for disability determination: Secondary | ICD-10-CM

## 2018-05-29 ENCOUNTER — Encounter: Payer: Self-pay | Admitting: Student in an Organized Health Care Education/Training Program

## 2018-05-29 ENCOUNTER — Ambulatory Visit
Payer: BC Managed Care – PPO | Attending: Student in an Organized Health Care Education/Training Program | Admitting: Student in an Organized Health Care Education/Training Program

## 2018-05-29 ENCOUNTER — Other Ambulatory Visit: Payer: Self-pay

## 2018-05-29 VITALS — BP 126/82 | HR 57 | Temp 98.5°F | Resp 16 | Ht 62.0 in | Wt 265.0 lb

## 2018-05-29 DIAGNOSIS — Z8673 Personal history of transient ischemic attack (TIA), and cerebral infarction without residual deficits: Secondary | ICD-10-CM | POA: Diagnosis not present

## 2018-05-29 DIAGNOSIS — M461 Sacroiliitis, not elsewhere classified: Secondary | ICD-10-CM

## 2018-05-29 DIAGNOSIS — G894 Chronic pain syndrome: Secondary | ICD-10-CM | POA: Diagnosis not present

## 2018-05-29 DIAGNOSIS — M5417 Radiculopathy, lumbosacral region: Secondary | ICD-10-CM | POA: Insufficient documentation

## 2018-05-29 DIAGNOSIS — F419 Anxiety disorder, unspecified: Secondary | ICD-10-CM | POA: Insufficient documentation

## 2018-05-29 DIAGNOSIS — I4891 Unspecified atrial fibrillation: Secondary | ICD-10-CM | POA: Insufficient documentation

## 2018-05-29 DIAGNOSIS — M533 Sacrococcygeal disorders, not elsewhere classified: Secondary | ICD-10-CM

## 2018-05-29 DIAGNOSIS — Z79899 Other long term (current) drug therapy: Secondary | ICD-10-CM | POA: Insufficient documentation

## 2018-05-29 DIAGNOSIS — Z881 Allergy status to other antibiotic agents status: Secondary | ICD-10-CM | POA: Diagnosis not present

## 2018-05-29 DIAGNOSIS — G8929 Other chronic pain: Secondary | ICD-10-CM

## 2018-05-29 DIAGNOSIS — M5137 Other intervertebral disc degeneration, lumbosacral region: Secondary | ICD-10-CM | POA: Insufficient documentation

## 2018-05-29 DIAGNOSIS — Z880 Allergy status to penicillin: Secondary | ICD-10-CM | POA: Diagnosis not present

## 2018-05-29 DIAGNOSIS — M47896 Other spondylosis, lumbar region: Secondary | ICD-10-CM | POA: Insufficient documentation

## 2018-05-29 DIAGNOSIS — E559 Vitamin D deficiency, unspecified: Secondary | ICD-10-CM | POA: Diagnosis not present

## 2018-05-29 DIAGNOSIS — R0789 Other chest pain: Secondary | ICD-10-CM | POA: Diagnosis not present

## 2018-05-29 DIAGNOSIS — J4520 Mild intermittent asthma, uncomplicated: Secondary | ICD-10-CM | POA: Diagnosis not present

## 2018-05-29 DIAGNOSIS — M1388 Other specified arthritis, other site: Secondary | ICD-10-CM | POA: Diagnosis not present

## 2018-05-29 DIAGNOSIS — R7303 Prediabetes: Secondary | ICD-10-CM | POA: Insufficient documentation

## 2018-05-29 DIAGNOSIS — M545 Low back pain, unspecified: Secondary | ICD-10-CM

## 2018-05-29 DIAGNOSIS — G43909 Migraine, unspecified, not intractable, without status migrainosus: Secondary | ICD-10-CM | POA: Insufficient documentation

## 2018-05-29 DIAGNOSIS — K219 Gastro-esophageal reflux disease without esophagitis: Secondary | ICD-10-CM | POA: Insufficient documentation

## 2018-05-29 DIAGNOSIS — G40209 Localization-related (focal) (partial) symptomatic epilepsy and epileptic syndromes with complex partial seizures, not intractable, without status epilepticus: Secondary | ICD-10-CM | POA: Diagnosis not present

## 2018-05-29 DIAGNOSIS — M542 Cervicalgia: Secondary | ICD-10-CM | POA: Insufficient documentation

## 2018-05-29 DIAGNOSIS — Z6841 Body Mass Index (BMI) 40.0 and over, adult: Secondary | ICD-10-CM | POA: Diagnosis not present

## 2018-05-29 DIAGNOSIS — M7062 Trochanteric bursitis, left hip: Secondary | ICD-10-CM | POA: Diagnosis not present

## 2018-05-29 DIAGNOSIS — M47818 Spondylosis without myelopathy or radiculopathy, sacral and sacrococcygeal region: Secondary | ICD-10-CM | POA: Diagnosis not present

## 2018-05-29 NOTE — Progress Notes (Signed)
Patient's Name: [name] [name]  MRN: [id number]  Referring Provider: [name], MD  DOB: 1966/01/06  PCP: [name], MD  DOS: 05/29/2018  Note by: [name], MD  Service setting: Ambulatory outpatient  Specialty: Interventional Pain Management  Location: [facility] (AMB) Pain Management Facility    Patient type: Established   Primary Reason(s) for Visit: Encounter for post-procedure evaluation of chronic illness with mild to moderate exacerbation CC: Back Pain (lower)  HPI  [name] is a 52 y.o. year old, female patient, who comes today for a post-procedure evaluation. She has Incisional hernia, without obstruction or gangrene; GERD (gastroesophageal reflux disease); Obesity, Class III, BMI 40-49.9 (morbid obesity) ([name] [name]); Generalized convulsive epilepsy ([name] [name]); History of recent fall; Paresthesia; CVA (cerebral infarction); Right sided weakness; Seizure disorder ([name] [name]); Chronic headaches; Atrial fibrillation ([place]); Right arm weakness; Asthma, mild intermittent; TIA (transient ischemic attack); Morbid obesity ([name] [name]); Intractable migraine with aura with status migrainosus; Chronic post-traumatic headache, not intractable; Migraine with aura and with status migrainosus, not intractable; Subarachnoid hemorrhage following injury, with loss of consciousness ([name] [name]); TBI (traumatic brain injury) ([name] [name]); Partial symptomatic epilepsy with complex partial seizures, not intractable, without status epilepticus ([name] [name]); Headache; DDD (degenerative disc disease), lumbosacral; Lumbosacral radiculopathy; Surgical menopause on hormone replacement therapy; Incontinence in female; Status post TAH-BSO; Prediabetes; Vitamin D deficiency; Agitation; Pseudoseizure; Seizure-like activity ([name] [name]); Seizures ([name] [name]); Other parasomnia; Intractable migraine without aura and without status migrainosus; Nocturnal seizures ([name] [name]); Anxiety; Asthma; Cerebral artery occlusion with cerebral infarction ([name] [name]); Generalized muscle weakness; History of  CVA (cerebrovascular accident) without residual deficits; Other symptoms and signs involving the musculoskeletal system; Skin sensation disturbance; Migraine with aura; Migraines; Convulsions ([name] [name]); Class 3 obesity in adult; Transient cerebral ischemia; Lumbar spondylosis; Chronic pain syndrome; Chronic upper back pain; Chest pain; Atypical chest pain; T wave inversion in EKG; Class 3 severe obesity due to excess calories with serious comorbidity and body mass index (BMI) of 45.0 to 49.9 in adult [name] [name]); Moderate persistent asthma; and Chronic migraine with aura on their problem list. Her primarily concern today is the Back Pain (lower)  Pain Assessment: Location: Right, Lower Back Radiating: both legs with numbness in feet Onset: More than a month ago Duration: Chronic pain Quality: Aching, Dull Severity: 7 /10 (subjective, self-reported pain score)  Note: Reported level is inconsistent with clinical observations. Clinically the patient looks like a 3/10 A 3/10 is viewed as "Moderate" and described as significantly interfering with activities of daily living (ADL). It becomes difficult to feed, bathe, get dressed, get on and off the toilet or to perform personal hygiene functions. Difficult to get in and out of bed or a chair without assistance. Very distracting. With effort, it can be ignored when deeply involved in activities. Information on the proper use of the pain scale provided to the patient today. When using our objective Pain Scale, levels between 6 and 10/10 are said to belong in an emergency room, as it progressively worsens from a 6/10, described as severely limiting, requiring emergency care not usually available at an outpatient pain management facility. At a 6/10 level, communication becomes difficult and requires great effort. Assistance to reach the emergency department may be required. Facial flushing and profuse sweating along with potentially dangerous increases in heart rate and blood  pressure will be evident. Effect on ADL:   Timing: Constant Modifying factors: nothing BP: 126/82  HR: (!) 57  [name] comes in today for post-procedure evaluation after the treatment done on 05/06/2018.  Further details on both, my assessment(s), as  well as the proposed treatment plan, please see below.  Post-Procedure Assessment  05/05/2018 Procedure: Left SI joint injection, left greater trochanteric bursa injection Pre-procedure pain score:  6/10 Post-procedure pain score: 0/10         Influential Factors: BMI: 48.47 kg/m Intra-procedural challenges: None observed.         Assessment challenges: None detected.              Reported side-effects: None.        Post-procedural adverse reactions or complications: None reported         Sedation: Please see nurses note. When no sedatives are used, the analgesic levels obtained are directly associated to the effectiveness of the local anesthetics. However, when sedation is provided, the level of analgesia obtained during the initial 1 hour following the intervention, is believed to be the result of a combination of factors. These factors may include, but are not limited to: 1. The effectiveness of the local anesthetics used. 2. The effects of the analgesic(s) and/or anxiolytic(s) used. 3. The degree of discomfort experienced by the patient at the time of the procedure. 4. The patients ability and reliability in recalling and recording the events. 5. The presence and influence of possible secondary gains and/or psychosocial factors. Reported result: Relief experienced during the 1st hour after the procedure: 100 % (Ultra-Short Term Relief)            Interpretative annotation: Clinically appropriate result. Analgesia during this period is likely to be Local Anesthetic and/or IV Sedative (Analgesic/Anxiolytic) related.          Effects of local anesthetic: The analgesic effects attained during this period are directly associated to the  localized infiltration of local anesthetics and therefore cary significant diagnostic value as to the etiological location, or anatomical origin, of the pain. Expected duration of relief is directly dependent on the pharmacodynamics of the local anesthetic used. Long-acting (4-6 hours) anesthetics used.  Reported result: Relief during the next 4 to 6 hour after the procedure: 100 % (Short-Term Relief)            Interpretative annotation: Clinically appropriate result. Analgesia during this period is likely to be Local Anesthetic-related.          Long-term benefit: Defined as the period of time past the expected duration of local anesthetics (1 hour for short-acting and 4-6 hours for long-acting). With the possible exception of prolonged sympathetic blockade from the local anesthetics, benefits during this period are typically attributed to, or associated with, other factors such as analgesic sensory neuropraxia, antiinflammatory effects, or beneficial biochemical changes provided by agents other than the local anesthetics.  Reported result: Extended relief following procedure: 50 % (Long-Term Relief)            Interpretative annotation: Clinically possible results. Good relief. No permanent benefit expected. Inflammation plays a part in the etiology to the pain.          Current benefits: Defined as reported results that persistent at this [name] in time.   Analgesia: 25-50 %            Function: Somewhat improved ROM: Somewhat improved Interpretative annotation: Recurrence of symptoms. Especially in regards to posterior SI joint related pain.Therapeutic benefit observed. Effective diagnostic intervention.           Patient obtaining greater benefit in regards to her greater trochanteric bursa pain and states that now she is having return of her SI joint related pain.  This is also  present on the right side.  We discussed repeating bilateral SI joint injections.  Interpretation: Results would  suggest a successful diagnostic and therapeutic intervention.                   Plan:  Repeat treatment or therapy and compare extent and duration of benefits.                Laboratory Chemistry  Inflammation Markers (CRP: Acute Phase) (ESR: Chronic Phase) Lab Results  Component Value Date   LATICACIDVEN 0.54 09/05/2014                         Rheumatology Markers No results found for: RF, ANA, LABURIC, URICUR, LYMEIGGIGMAB, LYMEABIGMQN, HLAB27                      Renal Function Markers Lab Results  Component Value Date   BUN 16 11/20/2017   CREATININE 0.87 11/20/2017   BCR 14 11/13/2017   GFRAA >60 11/20/2017   GFRNONAA >60 11/20/2017                             Hepatic Function Markers Lab Results  Component Value Date   AST 16 11/20/2017   ALT 15 11/20/2017   ALBUMIN 3.4 (L) 11/20/2017   ALKPHOS 93 11/20/2017   AMYLASE 54 11/18/2012   LIPASE 11 11/23/2016                        Electrolytes Lab Results  Component Value Date   NA 140 11/20/2017   K 3.6 11/20/2017   CL 110 11/20/2017   CALCIUM 8.6 (L) 11/20/2017   MG 1.8 09/26/2014                        Neuropathy Markers Lab Results  Component Value Date   VITAMINB12 491 09/10/2016   FOLATE >20.0 09/10/2016   HGBA1C 5.5 10/09/2016   HIV Non Reactive 11/19/2017                        CNS Tests No results found for: COLORCSF, APPEARCSF, RBCCOUNTCSF, WBCCSF, POLYSCSF, LYMPHSCSF, EOSCSF, PROTEINCSF, GLUCCSF, JCVIRUS, CSFOLI, IGGCSF                      Bone Pathology Markers Lab Results  Component Value Date   VD25OH 23.5 (L) 10/09/2016   VD125OH2TOT 47 03/09/2015   [id number] 24 03/09/2015   [id number] 23 03/09/2015                         Coagulation Parameters Lab Results  Component Value Date   INR 1.04 02/05/2016   LABPROT 13.8 02/05/2016   APTT 35 02/05/2016   PLT 190 11/20/2017   DDIMER 1.20 (H) 11/19/2017                        Cardiovascular Markers Lab Results  Component Value  Date   BNP 30.9 10/01/2014   CKTOTAL 101 05/29/2007   CKMB 1.2 05/29/2007   TROPONINI <0.03 11/20/2017   HGB 12.7 11/20/2017   HCT 38.7 11/20/2017                         CA Markers No  results found for: CEA, CA125, LABCA2                      Note: Lab results reviewed.  Recent Diagnostic Imaging Results  DG C-Arm 1-60 Min-No Report Fluoroscopy was utilized by the requesting physician.  No radiographic  interpretation.   Complexity Note: Imaging results reviewed. Results shared with Ms. Watling, using Layman's terms.                         Meds   Current Outpatient Medications:  .  albuterol (PROVENTIL HFA;VENTOLIN HFA) 108 (90 BASE) MCG/ACT inhaler, Inhale 2 puffs into the lungs every 6 (six) hours as needed for wheezing or shortness of breath. , Disp: , Rfl:  .  albuterol (PROVENTIL) (2.5 MG/3ML) 0.083% nebulizer solution, Take 2.5 mg by nebulization every 6 (six) hours as needed for wheezing or shortness of breath., Disp: , Rfl:  .  cetirizine (ZYRTEC) 10 MG tablet, Take 10 mg by mouth daily as needed., Disp: , Rfl: 1 .  clonazePAM (KLONOPIN) 1 MG tablet, TAKE 1 TABLET BY MOUTH AT NIGHT AS NEEDED, USE AFTER FACETTE INJECTIONS, Disp: 90 tablet, Rfl: 0 .  dicyclomine (BENTYL) 20 MG tablet, Take 1 tablet by mouth every 6 (six) hours as needed., Disp: , Rfl:  .  FLUoxetine (PROZAC) 40 MG capsule, Take 1 capsule (40 mg total) by mouth daily., Disp: 90 capsule, Rfl: 1 .  Fremanezumab-vfrm (AJOVY) 225 MG/1.5ML SOSY, Inject 225 mg every 30 (thirty) days into the skin., Disp: 1 Syringe, Rfl: 11 .  gabapentin (NEURONTIN) 300 MG capsule, TAKE 1 CAPSULE (300 MG TOTAL) AT BEDTIME BY MOUTH., Disp: 90 capsule, Rfl: 3 .  lamoTRIgine (LAMICTAL) 150 MG tablet, Take 1 tablet (150 mg total) 2 (two) times daily by mouth., Disp: 180 tablet, Rfl: 3 .  omeprazole (PRILOSEC) 40 MG capsule, Take 40 mg by mouth daily., Disp: , Rfl:  .  [START ON 06/07/2018] oxyCODONE (OXY IR/ROXICODONE) 5 MG immediate  release tablet, Take 1 tablet (5 mg total) by mouth daily as needed for severe pain., Disp: 30 tablet, Rfl: 0 .  ranitidine (ZANTAC) 300 MG tablet, Take 300 mg by mouth daily as needed for heartburn. , Disp: , Rfl:  .  SUMAtriptan (IMITREX) 50 MG tablet, Take 1 tablet (50 mg total) by mouth every 2 (two) hours as needed for migraine. May repeat in 2 hours if headache persists or recurs., Disp: 30 tablet, Rfl: 1 .  tiZANidine (ZANAFLEX) 4 MG tablet, Take 1 tablet (4 mg total) by mouth every 8 (eight) hours as needed for muscle spasms., Disp: 90 tablet, Rfl: 2 .  topiramate (TOPAMAX) 100 MG tablet, Take 1 tablet (100 mg total) 2 (two) times daily by mouth., Disp: 180 tablet, Rfl: 3 .  traZODone (DESYREL) 100 MG tablet, Take 1 tablet (100 mg total) by mouth at bedtime., Disp: 90 tablet, Rfl: 0 .  Vitamin D, Ergocalciferol, (DRISDOL) 50000 units CAPS capsule, Take 1 capsule (50,000 Units total) by mouth every Tuesday., Disp: 30 capsule, Rfl: 0 .  lacosamide (VIMPAT) 50 MG TABS tablet, Take 1 tablet (50 mg total) by mouth 2 (two) times daily. (Patient not taking: Reported on 05/05/2018), Disp: 60 tablet, Rfl: 5 .  ondansetron (ZOFRAN) 4 MG tablet, Take 1 tablet (4 mg total) by mouth every 8 (eight) hours as needed for nausea or vomiting. (Patient not taking: Reported on 05/29/2018), Disp: 10 tablet, Rfl: 0  Current Facility-Administered Medications:  .  Fremanezumab-vfrm SOSY 1.5 mL, 1.5 mL, Subcutaneous, Q30 days, Dohmeier, Asencion Partridge, MD  ROS  Constitutional: Denies any fever or chills Gastrointestinal: No reported hemesis, hematochezia, vomiting, or acute GI distress Musculoskeletal: Denies any acute onset joint swelling, redness, loss of ROM, or weakness Neurological: No reported episodes of acute onset apraxia, aphasia, dysarthria, agnosia, amnesia, paralysis, loss of coordination, or loss of consciousness  Allergies  Ms. Bracken is allergic to amoxicillin; penicillins; biaxin [clarithromycin];  erythromycin base; and zithromax [azithromycin].  PFSH  Drug: Ms. Cooperwood  reports that she does not use drugs. Alcohol:  reports that she does not drink alcohol. Tobacco:  reports that she has never smoked. She has never used smokeless tobacco. Medical:  has a past medical history of Anginal pain ([place]), Anxiety, Asthma, Atrial fibrillation ([place]), Chest pain, Chronic back pain, Chronic bronchitis ([place]), Complication of anesthesia, Daily headache, Epilepsy ([place]), Fibroid, GERD (gastroesophageal reflux disease), Head injury (1975; 2015), Joint pain, Migraine, Obesity, Class III, BMI 40-49.9 (morbid obesity) ([place]) (12/21/2012), Ovarian cyst, Pre-diabetes, Seizures ([place]), Shortness of breath dyspnea, TBI (traumatic brain injury) ([place]) (2015), TIA (transient ischemic attack) (04/2015?), and Vitamin D deficiency. Surgical: Ms. Flaten  has a past surgical history that includes Cesarean section (1989); Esophagogastroduodenoscopy (N/A, 11/21/2012); Colonoscopy (N/A, 11/21/2012); Incisional hernia repair (N/A, 12/17/2012); Insertion of mesh (N/A, 12/17/2012); Cardiac catheterization (N/A, 01/21/2015); Appendectomy (1990s); Hernia repair; Dilation and curettage of uterus (1985); Ovarian cyst removal (2007; 2014); Abdominal hysterectomy (2005); Colonoscopy with propofol (N/A, 09/02/2017); and Esophagogastroduodenoscopy (egd) with propofol (N/A, 09/02/2017). Family: family history includes Breast cancer in her cousin; COPD in her father; Depression in her mother; Diabetes in her father; Emphysema in her father; Heart attack in her father; Heart disease in her father and mother; Hypertension in her father and mother.  Constitutional Exam  General appearance: Well nourished, well developed, and well hydrated. In no apparent acute distress Vitals:   05/29/18 0957  BP: 126/82  Pulse: (!) 57  Resp: 16  Temp: 98.5 F (36.9 C)  TempSrc: Oral  SpO2: 98%  Weight: 265 lb (120.2 kg)  Height: _0  (1.575 m)   BMI Assessment:  Estimated body mass index is 48.47 kg/m as calculated from the following:   Height as of this encounter: _1  (1.575 m).   Weight as of this encounter: 265 lb (120.2 kg).  BMI interpretation table: BMI level Category Range association with higher incidence of chronic pain  <18 kg/m2 Underweight   18.5-24.9 kg/m2 Ideal body weight   25-29.9 kg/m2 Overweight Increased incidence by 20%  30-34.9 kg/m2 Obese (Class I) Increased incidence by 68%  35-39.9 kg/m2 Severe obesity (Class II) Increased incidence by 136%  >40 kg/m2 Extreme obesity (Class III) Increased incidence by 254%   Patient's current BMI Ideal Body weight  Body mass index is 48.47 kg/m. Ideal body weight: 50.1 kg (110 lb 7.2 oz) Adjusted ideal body weight: 78.1 kg (172 lb 4.3 oz)   BMI Readings from Last 4 Encounters:  05/29/18 48.47 kg/m  05/05/18 49.75 kg/m  04/08/18 47.70 kg/m  03/12/18 49.32 kg/m   Wt Readings from Last 4 Encounters:  05/29/18 265 lb (120.2 kg)  05/05/18 272 lb (123.4 kg)  04/08/18 265 lb (120.2 kg)  03/12/18 274 lb (124.3 kg)  Psych/Mental status: Alert, oriented x 3 (person, place, & time)       Eyes: PERLA Respiratory: No evidence of acute respiratory distress  Cervical Spine Area Exam  Skin & Axial Inspection: No masses, redness, edema, swelling, or associated skin lesions  Alignment: Symmetrical Functional ROM: Unrestricted ROM      Stability: No instability detected Muscle Tone/Strength: Functionally intact. No obvious neuro-muscular anomalies detected. Sensory (Neurological): Unimpaired Palpation: No palpable anomalies              Upper Extremity (UE) Exam    Side: Right upper extremity  Side: Left upper extremity  Skin & Extremity Inspection: Skin color, temperature, and hair growth are WNL. No peripheral edema or cyanosis. No masses, redness, swelling, asymmetry, or associated skin lesions. No contractures.  Skin & Extremity Inspection: Skin color, temperature, and hair growth  are WNL. No peripheral edema or cyanosis. No masses, redness, swelling, asymmetry, or associated skin lesions. No contractures.  Functional ROM: Unrestricted ROM          Functional ROM: Unrestricted ROM          Muscle Tone/Strength: Functionally intact. No obvious neuro-muscular anomalies detected.  Muscle Tone/Strength: Functionally intact. No obvious neuro-muscular anomalies detected.  Sensory (Neurological): Unimpaired          Sensory (Neurological): Unimpaired          Palpation: No palpable anomalies              Palpation: No palpable anomalies              Provocative Test(s):  Phalen's test: deferred Tinel's test: deferred Apley's scratch test (touch opposite shoulder):  Action 1 (Across chest): deferred Action 2 (Overhead): deferred Action 3 (LB reach): deferred   Provocative Test(s):  Phalen's test: deferred Tinel's test: deferred Apley's scratch test (touch opposite shoulder):  Action 1 (Across chest): deferred Action 2 (Overhead): deferred Action 3 (LB reach): deferred    Thoracic Spine Area Exam  Skin & Axial Inspection: No masses, redness, or swelling Alignment: Symmetrical Functional ROM: Unrestricted ROM Stability: No instability detected Muscle Tone/Strength: Functionally intact. No obvious neuro-muscular anomalies detected. Sensory (Neurological): Unimpaired Muscle strength & Tone: No palpable anomalies  Lumbar Spine Area Exam  Skin & Axial Inspection: No masses, redness, or swelling Alignment: Symmetrical Functional ROM: Decreased ROM affecting both sides Stability: No instability detected Muscle Tone/Strength: Functionally intact. No obvious neuro-muscular anomalies detected. Sensory (Neurological): Musculoskeletal pain pattern Palpation: Complains of area being tender to palpation       Provocative Tests: Hyperextension/rotation test: (+) bilaterally for facet joint pain. Lumbar quadrant test (Kemp's test): deferred today       Lateral bending test:  (+) due to pain. Patrick's Maneuver: (+) for bilateral S-I arthralgia             FABER test: (+) for bilateral S-I arthralgia             S-I anterior distraction/compression test: deferred today         S-I lateral compression test: deferred today         S-I Thigh-thrust test: deferred today         S-I Gaenslen's test: deferred today          Gait & Posture Assessment  Ambulation: Unassisted Gait: Relatively normal for age and body habitus Posture: WNL   Lower Extremity Exam    Side: Right lower extremity  Side: Left lower extremity  Stability: No instability observed          Stability: No instability observed          Skin & Extremity Inspection: Skin color, temperature, and hair growth are WNL. No peripheral edema or cyanosis. No masses, redness, swelling, asymmetry, or associated skin lesions. No  contractures.  Skin & Extremity Inspection: Skin color, temperature, and hair growth are WNL. No peripheral edema or cyanosis. No masses, redness, swelling, asymmetry, or associated skin lesions. No contractures.  Functional ROM: Unrestricted ROM                  Functional ROM: Unrestricted ROM                  Muscle Tone/Strength: Functionally intact. No obvious neuro-muscular anomalies detected.  Muscle Tone/Strength: Functionally intact. No obvious neuro-muscular anomalies detected.  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 Diagnoses of Chronic left SI joint pain, Chronic pain syndrome, Trochanteric bursitis of left hip, and Back pain at L4-L5 level were also pertinent to this visit.  Status Diagnosis  Responding Responding Responding 1. SI joint arthritis   2. Chronic left SI joint pain   3. Chronic pain syndrome   4. Trochanteric bursitis of left hip   5. Back pain  at L4-L5 level     General Recommendations: The pain condition that the patient suffers from is best treated with a multidisciplinary approach that involves an increase in physical activity to prevent de-conditioning and worsening of the pain cycle, as well as psychological counseling (formal and/or informal) to address the co-morbid psychological affects of pain. Treatment will often involve judicious use of pain medications and interventional procedures to decrease the pain, allowing the patient to participate in the physical activity that will ultimately produce long-lasting pain reductions. The goal of the multidisciplinary approach is to return the patient to a higher level of overall function and to restore their ability to perform activities of daily living.  53 year old female who follows up status post left SI joint injection and left greater trochanteric bursa injection performed on 05/05/2018.  Patient endorses pain relief after this procedure in regards to her lateral thigh, buttock and groin pain.  She states that she is still experiencing ongoing pain relief in regards to her left lateral thigh pain however is having return of her SI joint related pain that is radiating into her groin.  She states that this pain is now also on the right side.  We discussed repeating SI joint injection but doing them bilaterally.  We discussed holding off on the greater trochanteric bursa injection for the time being as she is experiencing pain relief in that region.  Of note patient has been having difficulty managing her migraines. She is scheduled for [facility] for further evaluation of her migraines.  Patient will follow-up for bilateral SI joint injections for SI joint arthritis.  We will also use that visit for medication refill.  Interventional history: -Status post 2 sets of diagnostic bilateral L3, L4, L5 facet medial branch nerve blocks which were not effective, defer lumbar  RFA -Status post left SI joint injection and left greater trochanteric bursa injection on 03/12/2018 which provided significant benefit in regards to left hip and buttock pain -Status post left SI joint injection and left greater trochanteric bursa injection on 05/05/2018 which provided significant benefit, greater benefit in regards to her greater trochanteric bursa pain with return of left buttock pain.  Plan of Care  Lab-work, procedure(s), and/or referral(s): Orders Placed This Encounter  Procedures  . SACROILIAC JOINT INJECTION   Provider-requested follow-up: Return in about 4 weeks (around 06/26/2018)  for Procedure.  Time Note: Greater than 50% of the 25 minute(s) of face-to-face time spent with Ms. Tormey, was spent in counseling/coordination of care regarding: Ms. Gervase primary cause of pain, the treatment plan, treatment alternatives, the risks and possible complications of proposed treatment, going over the informed consent, the results, interpretation and significance of  her recent diagnostic interventional treatment(s), realistic expectations and the goals of pain management (increased in functionality).  Future Appointments  Date Time Provider [place]  06/23/2018  9:00 AM [name], MD [facility] None  07/29/2018  7:30 AM Ward Givens, NP [facility] None    Primary Care Physician: [name], MD Location: [facility] Note by: Gillis [name], M.D Date: 05/29/2018; Time: 11:00 AM  Patient Instructions   Moderate Conscious Sedation, Adult Sedation is the use of medicines to promote relaxation and relieve discomfort and anxiety. Moderate conscious sedation is a type of sedation. Under moderate conscious sedation, you are less alert than normal, but you are still able to respond to instructions, touch, or both. Moderate conscious sedation is used during short medical and dental procedures. It is milder than deep sedation, which is a type  of sedation under which you cannot be easily woken up. It is also milder than general anesthesia, which is the use of medicines to make you unconscious. Moderate conscious sedation allows you to return to your regular activities sooner. Tell a health care provider about:  Any allergies you have.  All medicines you are taking, including vitamins, herbs, eye drops, creams, and over-the-counter medicines.  Use of steroids (by mouth or creams).  Any problems you or family members have had with sedatives and anesthetic medicines.  Any blood disorders you have.  Any surgeries you have had.  Any medical conditions you have, such as sleep apnea.  Whether you are pregnant or may be pregnant.  Any use of cigarettes, alcohol, marijuana, or street drugs. What are the risks? Generally, this is a safe procedure. However, problems may occur, including:  Getting too much medicine (oversedation).  Nausea.  Allergic reaction to medicines.  Trouble breathing. If this happens, a breathing tube may be used to help with breathing. It will be removed when you are awake and breathing on your own.  Heart trouble.  Lung trouble.  What happens before the procedure? Staying hydrated Follow instructions from your health care provider about hydration, which may include:  Up to 2 hours before the procedure - you may continue to drink clear liquids, such as water, clear fruit juice, black coffee, and plain tea.  Eating and drinking restrictions Follow instructions from your health care provider about eating and drinking, which may include:  8 hours before the procedure - stop eating heavy meals or foods such as meat, fried foods, or fatty foods.  6 hours before the procedure - stop eating light meals or foods, such as toast or cereal.  6 hours before the procedure - stop drinking milk or drinks that contain milk.  2 hours before the procedure - stop drinking clear liquids.  Medicine  Ask your  health care provider about:  Changing or stopping your regular medicines. This is especially important if you are taking diabetes medicines or blood thinners.  Taking medicines such as aspirin and ibuprofen. These medicines can thin your blood. Do not take these medicines before your procedure if your health care provider instructs you not to.  Tests and exams  You will have a physical exam.  You may have  blood tests done to show: ? How well your kidneys and liver are working. ? How well your blood can clot. General instructions  Plan to have someone take you home from the [name] or clinic.  If you will be going home right after the procedure, plan to have someone with you for 24 hours. What happens during the procedure?  An IV tube will be inserted into one of your veins.  Medicine to help you relax (sedative) will be given through the IV tube.  The medical or dental procedure will be performed. What happens after the procedure?  Your blood pressure, heart rate, breathing rate, and blood oxygen level will be monitored often until the medicines you were given have worn off.  Do not drive for 24 hours. This information is not intended to replace advice given to you by your health care provider. Make sure you discuss any questions you have with your health care provider. Document Released: 05/08/2001 Document Revised: 01/17/2016 Document Reviewed: 12/03/2015 Elsevier Interactive Patient Education  2018 [place]  What are the risk, side effects and possible complications? Generally speaking, most procedures are safe.  However, with any procedure there are risks, side effects, and the possibility of complications.  The risks and complications are dependent upon the sites that are lesioned, or the type of nerve block to be performed.  The closer the procedure is to the spine, the more serious the risks are.  Great care is taken when placing the  radio frequency needles, block needles or lesioning probes, but sometimes complications can occur. 1. Infection: Any time there is an injection through the skin, there is a risk of infection.  This is why sterile conditions are used for these blocks.  There are four possible types of infection. 1. Localized skin infection. 2. Central Nervous System Infection-This can be in the form of Meningitis, which can be deadly. 3. Epidural Infections-This can be in the form of an epidural abscess, which can cause pressure inside of the spine, causing compression of the spinal cord with subsequent paralysis. This would require an emergency surgery to decompress, and there are no guarantees that the patient would recover from the paralysis. 4. Discitis-This is an infection of the intervertebral discs.  It occurs in about 1% of discography procedures.  It is difficult to treat and it may lead to surgery.        2. Pain: the needles have to go through skin and soft tissues, will cause soreness.       3. Damage to internal structures:  The nerves to be lesioned may be near blood vessels or    other nerves which can be potentially damaged.       4. Bleeding: Bleeding is more common if the patient is taking blood thinners such as  aspirin, Coumadin, Ticiid, Plavix, etc., or if he/she have some genetic predisposition  such as hemophilia. Bleeding into the spinal canal can cause compression of the spinal  cord with subsequent paralysis.  This would require an emergency surgery to  decompress and there are no guarantees that the patient would recover from the  paralysis.       5. Pneumothorax:  Puncturing of a lung is a possibility, every time a needle is introduced in  the area of the chest or upper back.  Pneumothorax refers to free air around the  collapsed lung(s), inside of the thoracic cavity (chest cavity).  Another two possible  complications related to  a similar event would include: Hemothorax and Chylothorax.     These are variations of the Pneumothorax, where instead of air around the collapsed  lung(s), you may have blood or chyle, respectively.       6. Spinal headaches: They may occur with any procedures in the area of the spine.       7. Persistent CSF (Cerebro-Spinal Fluid) leakage: This is a rare problem, but may occur  with prolonged intrathecal or epidural catheters either due to the formation of a fistulous  track or a dural tear.       8. Nerve damage: By working so close to the spinal cord, there is always a possibility of  nerve damage, which could be as serious as a permanent spinal cord injury with  paralysis.       9. Death:  Although rare, severe deadly allergic reactions known as "Anaphylactic  reaction" can occur to any of the medications used.      10. Worsening of the symptoms:  We can always make thing worse.  What are the chances of something like this happening? Chances of any of this occuring are extremely low.  By statistics, you have more of a chance of getting killed in a motor vehicle accident: while driving to the [name] than any of the above occurring .  Nevertheless, you should be aware that they are possibilities.  In general, it is similar to taking a shower.  Everybody knows that you can slip, hit your head and get killed.  Does that mean that you should not shower again?  Nevertheless always keep in mind that statistics do not mean anything if you happen to be on the wrong side of them.  Even if a procedure has a 1 (one) in a 1,000,000 (million) chance of going wrong, it you happen to be that one..Also, keep in mind that by statistics, you have more of a chance of having something go wrong when taking medications.  Who should not have this procedure? If you are on a blood thinning medication (e.g. Coumadin, Plavix, see list of "Blood Thinners"), or if you have an active infection going on, you should not have the procedure.  If you are taking any blood thinners, please inform  your physician.  How should I prepare for this procedure?  Do not eat or drink anything at least six hours prior to the procedure.  Bring a driver with you .  It cannot be a taxi.  Come accompanied by an adult that can drive you back, and that is strong enough to help you if your legs get weak or numb from the local anesthetic.  Take all of your medicines the morning of the procedure with just enough water to swallow them.  If you have diabetes, make sure that you are scheduled to have your procedure done first thing in the morning, whenever possible.  If you have diabetes, take only half of your insulin dose and notify our nurse that you have done so as soon as you arrive at the clinic.  If you are diabetic, but only take blood sugar pills (oral hypoglycemic), then do not take them on the morning of your procedure.  You may take them after you have had the procedure.  Do not take aspirin or any aspirin-containing medications, at least eleven (11) days prior to the procedure.  They may prolong bleeding.  Wear loose fitting clothing that may be easy to take off and that you would  not mind if it got stained with Betadine or blood.  Do not wear any jewelry or perfume  Remove any nail coloring.  It will interfere with some of our monitoring equipment.  NOTE: Remember that this is not meant to be interpreted as a complete list of all possible complications.  Unforeseen problems may occur.  BLOOD THINNERS The following drugs contain aspirin or other products, which can cause increased bleeding during surgery and should not be taken for 2 weeks prior to and 1 week after surgery.  If you should need take something for relief of minor pain, you may take acetaminophen which is found in Tylenol,m Datril, Anacin-3 and Panadol. It is not blood thinner. The products listed below are.  Do not take any of the products listed below in addition to any listed on your instruction sheet.  A.P.C or A.P.C  with Codeine Codeine Phosphate Capsules #3 Ibuprofen Ridaura  ABC compound Congesprin Imuran rimadil  Advil Cope Indocin Robaxisal  Alka-Seltzer Effervescent Pain Reliever and Antacid Coricidin or Coricidin-D  Indomethacin Rufen  Alka-Seltzer plus Cold Medicine Cosprin Ketoprofen S-A-C Tablets  Anacin Analgesic Tablets or Capsules Coumadin Korlgesic Salflex  Anacin Extra Strength Analgesic tablets or capsules CP-2 Tablets Lanoril Salicylate  Anaprox Cuprimine Capsules Levenox Salocol  Anexsia-D Dalteparin Magan Salsalate  Anodynos Darvon compound Magnesium Salicylate Sine-off  Ansaid Dasin Capsules Magsal Sodium Salicylate  Anturane Depen Capsules Marnal Soma  APF Arthritis pain formula Dewitt's Pills Measurin Stanback  Argesic Dia-Gesic Meclofenamic Sulfinpyrazone  Arthritis Bayer Timed Release Aspirin Diclofenac Meclomen Sulindac  Arthritis pain formula Anacin Dicumarol Medipren Supac  Analgesic (Safety coated) Arthralgen Diffunasal Mefanamic Suprofen  Arthritis Strength Bufferin Dihydrocodeine Mepro Compound Suprol  Arthropan liquid Dopirydamole Methcarbomol with Aspirin Synalgos  ASA tablets/Enseals Disalcid Micrainin Tagament  Ascriptin Doan's Midol Talwin  Ascriptin A/D Dolene Mobidin Tanderil  Ascriptin Extra Strength Dolobid Moblgesic Ticlid  Ascriptin with Codeine Doloprin or Doloprin with Codeine Momentum Tolectin  Asperbuf Duoprin Mono-gesic Trendar  Aspergum Duradyne Motrin or Motrin IB Triminicin  Aspirin plain, buffered or enteric coated Durasal Myochrisine Trigesic  Aspirin Suppositories Easprin Nalfon Trillsate  Aspirin with Codeine Ecotrin Regular or Extra Strength Naprosyn Uracel  Atromid-S Efficin Naproxen Ursinus  Auranofin Capsules Elmiron Neocylate Vanquish  Axotal Emagrin Norgesic Verin  Azathioprine Empirin or Empirin with Codeine Normiflo Vitamin E  Azolid Emprazil Nuprin Voltaren  Bayer Aspirin plain, buffered or children's or timed BC Tablets or powders  Encaprin Orgaran Warfarin Sodium  Buff-a-Comp Enoxaparin Orudis Zorpin  Buff-a-Comp with Codeine Equegesic Os-Cal-Gesic   Buffaprin Excedrin plain, buffered or Extra Strength Oxalid   Bufferin Arthritis Strength Feldene Oxphenbutazone   Bufferin plain or Extra Strength Feldene Capsules Oxycodone with Aspirin   Bufferin with Codeine Fenoprofen Fenoprofen Pabalate or Pabalate-SF   Buffets II Flogesic Panagesic   Buffinol plain or Extra Strength Florinal or Florinal with Codeine Panwarfarin   Buf-Tabs Flurbiprofen Penicillamine   Butalbital Compound Four-way cold tablets Penicillin   Butazolidin Fragmin Pepto-Bismol   Carbenicillin Geminisyn Percodan   Carna Arthritis Reliever Geopen Persantine   Carprofen Gold's salt Persistin   Chloramphenicol Goody's Phenylbutazone   Chloromycetin Haltrain Piroxlcam   Clmetidine heparin Plaquenil   Cllnoril Hyco-pap Ponstel   Clofibrate Hydroxy chloroquine Propoxyphen         Before stopping any of these medications, be sure to consult the physician who ordered them.  Some, such as Coumadin (Warfarin) are ordered to prevent or treat serious conditions such as "deep thrombosis", "pumonary embolisms", and other heart problems.  The amount of time that you may need off of the medication may also vary with the medication and the reason for which you were taking it.  If you are taking any of these medications, please make sure you notify your pain physician before you undergo any procedures.         Sacroiliac (SI) Joint Injection Patient Information  Description: The sacroiliac joint connects the scrum (very low back and tailbone) to the ilium (a pelvic bone which also forms half of the hip joint).  Normally this joint experiences very little motion.  When this joint becomes inflamed or unstable low back and or hip and pelvis pain may result.  Injection of this joint with local anesthetics (numbing medicines) and steroids can provide diagnostic  information and reduce pain.  This injection is performed with the aid of x-ray guidance into the tailbone area while you are lying on your stomach.   You may experience an electrical sensation down the leg while this is being done.  You may also experience numbness.  We also may ask if we are reproducing your normal pain during the injection.  Conditions which may be treated SI injection:   Low back, buttock, hip or leg pain  Preparation for the Injection:  1. Do not eat any solid food or dairy products within 8 hours of your appointment.  2. You may drink clear liquids up to 3 hours before appointment.  Clear liquids include water, black coffee, juice or soda.  No milk or cream please. 3. You may take your regular medications, including pain medications with a sip of water before your appointment.  Diabetics should hold regular insulin (if take separately) and take 1/2 normal NPH dose the morning of the procedure.  Carry some sugar containing items with you to your appointment. 4. A driver must accompany you and be prepared to drive you home after your procedure. 5. Bring all of your current medications with you. 6. An IV may be inserted and sedation may be given at the discretion of the physician. 7. A blood pressure cuff, EKG and other monitors will often be applied during the procedure.  Some patients may need to have extra oxygen administered for a short period.  8. You will be asked to provide medical information, including your allergies, prior to the procedure.  We must know immediately if you are taking blood thinners (like Coumadin/Warfarin) or if you are allergic to IV iodine contrast (dye).  We must know if you could possible be pregnant.  Possible side effects:   Bleeding from needle site  Infection (rare, may require surgery)  Nerve injury (rare)  Numbness & tingling (temporary)  A brief convulsion or seizure  Light-headedness (temporary)  Pain at injection site  (several days)  Decreased blood pressure (temporary)  Weakness in the leg (temporary)   Call if you experience:   New onset weakness or numbness of an extremity below the injection site that last more than 8 hours.  Hives or difficulty breathing ( go to the emergency room)  Inflammation or drainage at the injection site  Any new symptoms which are concerning to you  Please note:  Although the local anesthetic injected can often make your back/ hip/ buttock/ leg feel good for several hours after the injections, the pain will likely return.  It takes 3-7 days for steroids to work in the sacroiliac area.  You may not notice any pain relief for at least that one week.  If  effective, we will often do a series of three injections spaced 3-6 weeks apart to maximally decrease your pain.  After the initial series, we generally will wait some months before a repeat injection of the same type.  If you have any questions, please call [phone] [facility]

## 2018-05-29 NOTE — Progress Notes (Signed)
Safety precautions to be maintained throughout the outpatient stay will include: orient to surroundings, keep bed in low position, maintain call bell within reach at all times, provide assistance with transfer out of bed and ambulation.  

## 2018-05-29 NOTE — Patient Instructions (Signed)
Moderate Conscious Sedation, Adult Sedation is the use of medicines to promote relaxation and relieve discomfort and anxiety. Moderate conscious sedation is a type of sedation. Under moderate conscious sedation, you are less alert than normal, but you are still able to respond to instructions, touch, or both. Moderate conscious sedation is used during short medical and dental procedures. It is milder than deep sedation, which is a type of sedation under which you cannot be easily woken up. It is also milder than general anesthesia, which is the use of medicines to make you unconscious. Moderate conscious sedation allows you to return to your regular activities sooner. Tell a health care provider about:  Any allergies you have.  All medicines you are taking, including vitamins, herbs, eye drops, creams, and over-the-counter medicines.  Use of steroids (by mouth or creams).  Any problems you or family members have had with sedatives and anesthetic medicines.  Any blood disorders you have.  Any surgeries you have had.  Any medical conditions you have, such as sleep apnea.  Whether you are pregnant or may be pregnant.  Any use of cigarettes, alcohol, marijuana, or street drugs. What are the risks? Generally, this is a safe procedure. However, problems may occur, including:  Getting too much medicine (oversedation).  Nausea.  Allergic reaction to medicines.  Trouble breathing. If this happens, a breathing tube may be used to help with breathing. It will be removed when you are awake and breathing on your own.  Heart trouble.  Lung trouble.  What happens before the procedure? Staying hydrated Follow instructions from your health care provider about hydration, which may include:  Up to 2 hours before the procedure - you may continue to drink clear liquids, such as water, clear fruit juice, black coffee, and plain tea.  Eating and drinking restrictions Follow instructions from  your health care provider about eating and drinking, which may include:  8 hours before the procedure - stop eating heavy meals or foods such as meat, fried foods, or fatty foods.  6 hours before the procedure - stop eating light meals or foods, such as toast or cereal.  6 hours before the procedure - stop drinking milk or drinks that contain milk.  2 hours before the procedure - stop drinking clear liquids.  Medicine  Ask your health care provider about:  Changing or stopping your regular medicines. This is especially important if you are taking diabetes medicines or blood thinners.  Taking medicines such as aspirin and ibuprofen. These medicines can thin your blood. Do not take these medicines before your procedure if your health care provider instructs you not to.  Tests and exams  You will have a physical exam.  You may have blood tests done to show: ? How well your kidneys and liver are working. ? How well your blood can clot. General instructions  Plan to have someone take you home from the hospital or clinic.  If you will be going home right after the procedure, plan to have someone with you for 24 hours. What happens during the procedure?  An IV tube will be inserted into one of your veins.  Medicine to help you relax (sedative) will be given through the IV tube.  The medical or dental procedure will be performed. What happens after the procedure?  Your blood pressure, heart rate, breathing rate, and blood oxygen level will be monitored often until the medicines you were given have worn off.  Do not drive for 24 hours.   This information is not intended to replace advice given to you by your health care provider. Make sure you discuss any questions you have with your health care provider. Document Released: 05/08/2001 Document Revised: 01/17/2016 Document Reviewed: 12/03/2015 [employer] Interactive Patient Education  2018 [employer] GENERAL RISKS AND  COMPLICATIONS  What are the risk, side effects and possible complications? Generally speaking, most procedures are safe.  However, with any procedure there are risks, side effects, and the possibility of complications.  The risks and complications are dependent upon the sites that are lesioned, or the type of nerve block to be performed.  The closer the procedure is to the spine, the more serious the risks are.  Great care is taken when placing the radio frequency needles, block needles or lesioning probes, but sometimes complications can occur. 1. Infection: Any time there is an injection through the skin, there is a risk of infection.  This is why sterile conditions are used for these blocks.  There are four possible types of infection. 1. Localized skin infection. 2. Central Nervous System Infection-This can be in the form of Meningitis, which can be deadly. 3. Epidural Infections-This can be in the form of an epidural abscess, which can cause pressure inside of the spine, causing compression of the spinal cord with subsequent paralysis. This would require an emergency surgery to decompress, and there are no guarantees that the patient would recover from the paralysis. 4. Discitis-This is an infection of the intervertebral discs.  It occurs in about 1% of discography procedures.  It is difficult to treat and it may lead to surgery.        2. Pain: the needles have to go through skin and soft tissues, will cause soreness.       3. Damage to internal structures:  The nerves to be lesioned may be near blood vessels or    other nerves which can be potentially damaged.       4. Bleeding: Bleeding is more common if the patient is taking blood thinners such as  aspirin, Coumadin, Ticiid, Plavix, etc., or if he/she have some genetic predisposition  such as hemophilia. Bleeding into the spinal canal can cause compression of the spinal  cord with subsequent paralysis.  This would require an emergency surgery  to  decompress and there are no guarantees that the patient would recover from the  paralysis.       5. Pneumothorax:  Puncturing of a lung is a possibility, every time a needle is introduced in  the area of the chest or upper back.  Pneumothorax refers to free air around the  collapsed lung(s), inside of the thoracic cavity (chest cavity).  Another two possible  complications related to a similar event would include: Hemothorax and Chylothorax.   These are variations of the Pneumothorax, where instead of air around the collapsed  lung(s), you may have blood or chyle, respectively.       6. Spinal headaches: They may occur with any procedures in the area of the spine.       7. Persistent CSF (Cerebro-Spinal Fluid) leakage: This is a rare problem, but may occur  with prolonged intrathecal or epidural catheters either due to the formation of a fistulous  track or a dural tear.       8. Nerve damage: By working so close to the spinal cord, there is always a possibility of  nerve damage, which could be as serious as a permanent spinal cord injury with    paralysis.       9. Death:  Although rare, severe deadly allergic reactions known as "Anaphylactic  reaction" can occur to any of the medications used.      10. Worsening of the symptoms:  We can always make thing worse.  What are the chances of something like this happening? Chances of any of this occuring are extremely low.  By statistics, you have more of a chance of getting killed in a motor vehicle accident: while driving to the hospital than any of the above occurring .  Nevertheless, you should be aware that they are possibilities.  In general, it is similar to taking a shower.  Everybody knows that you can slip, hit your head and get killed.  Does that mean that you should not shower again?  Nevertheless always keep in mind that statistics do not mean anything if you happen to be on the wrong side of them.  Even if a procedure has a 1 (one) in a 1,000,000  (million) chance of going wrong, it you happen to be that one..Also, keep in mind that by statistics, you have more of a chance of having something go wrong when taking medications.  Who should not have this procedure? If you are on a blood thinning medication (e.g. Coumadin, Plavix, see list of "Blood Thinners"), or if you have an active infection going on, you should not have the procedure.  If you are taking any blood thinners, please inform your physician.  How should I prepare for this procedure?  Do not eat or drink anything at least six hours prior to the procedure.  Bring a driver with you .  It cannot be a taxi.  Come accompanied by an adult that can drive you back, and that is strong enough to help you if your legs get weak or numb from the local anesthetic.  Take all of your medicines the morning of the procedure with just enough water to swallow them.  If you have diabetes, make sure that you are scheduled to have your procedure done first thing in the morning, whenever possible.  If you have diabetes, take only half of your insulin dose and notify our nurse that you have done so as soon as you arrive at the clinic.  If you are diabetic, but only take blood sugar pills (oral hypoglycemic), then do not take them on the morning of your procedure.  You may take them after you have had the procedure.  Do not take aspirin or any aspirin-containing medications, at least eleven (11) days prior to the procedure.  They may prolong bleeding.  Wear loose fitting clothing that may be easy to take off and that you would not mind if it got stained with Betadine or blood.  Do not wear any jewelry or perfume  Remove any nail coloring.  It will interfere with some of our monitoring equipment.  NOTE: Remember that this is not meant to be interpreted as a complete list of all possible complications.  Unforeseen problems may occur.  BLOOD THINNERS The following drugs contain aspirin or other  products, which can cause increased bleeding during surgery and should not be taken for 2 weeks prior to and 1 week after surgery.  If you should need take something for relief of minor pain, you may take acetaminophen which is found in Tylenol,m Datril, Anacin-3 and Panadol. It is not blood thinner. The products listed below are.  Do not take any of the products listed   below in addition to any listed on your instruction sheet.  A.P.C or A.P.C with Codeine Codeine Phosphate Capsules #3 Ibuprofen Ridaura  ABC compound Congesprin Imuran rimadil  Advil Cope Indocin Robaxisal  Alka-Seltzer Effervescent Pain Reliever and Antacid Coricidin or Coricidin-D  Indomethacin Rufen  Alka-Seltzer plus Cold Medicine Cosprin Ketoprofen S-A-C Tablets  Anacin Analgesic Tablets or Capsules Coumadin Korlgesic Salflex  Anacin Extra Strength Analgesic tablets or capsules CP-2 Tablets Lanoril Salicylate  Anaprox Cuprimine Capsules Levenox Salocol  Anexsia-D Dalteparin Magan Salsalate  Anodynos Darvon compound Magnesium Salicylate Sine-off  Ansaid Dasin Capsules Magsal Sodium Salicylate  Anturane Depen Capsules Marnal Soma  APF Arthritis pain formula Dewitt's Pills Measurin Stanback  Argesic Dia-Gesic Meclofenamic Sulfinpyrazone  Arthritis Bayer Timed Release Aspirin Diclofenac Meclomen Sulindac  Arthritis pain formula Anacin Dicumarol Medipren Supac  Analgesic (Safety coated) Arthralgen Diffunasal Mefanamic Suprofen  Arthritis Strength Bufferin Dihydrocodeine Mepro Compound Suprol  Arthropan liquid Dopirydamole Methcarbomol with Aspirin Synalgos  ASA tablets/Enseals Disalcid Micrainin Tagament  Ascriptin Doan's Midol Talwin  Ascriptin A/D Dolene Mobidin Tanderil  Ascriptin Extra Strength Dolobid Moblgesic Ticlid  Ascriptin with Codeine Doloprin or Doloprin with Codeine Momentum Tolectin  Asperbuf Duoprin Mono-gesic Trendar  Aspergum Duradyne Motrin or Motrin IB Triminicin  Aspirin plain, buffered or enteric  coated Durasal Myochrisine Trigesic  Aspirin Suppositories Easprin Nalfon Trillsate  Aspirin with Codeine Ecotrin Regular or Extra Strength Naprosyn Uracel  Atromid-S Efficin Naproxen Ursinus  Auranofin Capsules Elmiron Neocylate Vanquish  Axotal Emagrin Norgesic Verin  Azathioprine Empirin or Empirin with Codeine Normiflo Vitamin E  Azolid Emprazil Nuprin Voltaren  Bayer Aspirin plain, buffered or children's or timed BC Tablets or powders Encaprin Orgaran Warfarin Sodium  Buff-a-Comp Enoxaparin Orudis Zorpin  Buff-a-Comp with Codeine Equegesic Os-Cal-Gesic   Buffaprin Excedrin plain, buffered or Extra Strength Oxalid   Bufferin Arthritis Strength Feldene Oxphenbutazone   Bufferin plain or Extra Strength Feldene Capsules Oxycodone with Aspirin   Bufferin with Codeine Fenoprofen Fenoprofen Pabalate or Pabalate-SF   Buffets II Flogesic Panagesic   Buffinol plain or Extra Strength Florinal or Florinal with Codeine Panwarfarin   Buf-Tabs Flurbiprofen Penicillamine   Butalbital Compound Four-way cold tablets Penicillin   Butazolidin Fragmin Pepto-Bismol   Carbenicillin Geminisyn Percodan   Carna Arthritis Reliever Geopen Persantine   Carprofen Gold's salt Persistin   Chloramphenicol Goody's Phenylbutazone   Chloromycetin Haltrain Piroxlcam   Clmetidine heparin Plaquenil   Cllnoril Hyco-pap Ponstel   Clofibrate Hydroxy chloroquine Propoxyphen         Before stopping any of these medications, be sure to consult the physician who ordered them.  Some, such as Coumadin (Warfarin) are ordered to prevent or treat serious conditions such as "deep thrombosis", "pumonary embolisms", and other heart problems.  The amount of time that you may need off of the medication may also vary with the medication and the reason for which you were taking it.  If you are taking any of these medications, please make sure you notify your pain physician before you undergo any procedures.         Sacroiliac  (SI) Joint Injection Patient Information  Description: The sacroiliac joint connects the scrum (very low back and tailbone) to the ilium (a pelvic bone which also forms half of the hip joint).  Normally this joint experiences very little motion.  When this joint becomes inflamed or unstable low back and or hip and pelvis pain may result.  Injection of this joint with local anesthetics (numbing medicines) and steroids can provide diagnostic information and reduce  pain.  This injection is performed with the aid of x-ray guidance into the tailbone area while you are lying on your stomach.   You may experience an electrical sensation down the leg while this is being done.  You may also experience numbness.  We also may ask if we are reproducing your normal pain during the injection.  Conditions which may be treated SI injection:   Low back, buttock, hip or leg pain  Preparation for the Injection:  1. Do not eat any solid food or dairy products within 8 hours of your appointment.  2. You may drink clear liquids up to 3 hours before appointment.  Clear liquids include water, black coffee, juice or soda.  No milk or cream please. 3. You may take your regular medications, including pain medications with a sip of water before your appointment.  Diabetics should hold regular insulin (if take separately) and take 1/2 normal NPH dose the morning of the procedure.  Carry some sugar containing items with you to your appointment. 4. A driver must accompany you and be prepared to drive you home after your procedure. 5. Bring all of your current medications with you. 6. An IV may be inserted and sedation may be given at the discretion of the physician. 7. A blood pressure cuff, EKG and other monitors will often be applied during the procedure.  Some patients may need to have extra oxygen administered for a short period.  8. You will be asked to provide medical information, including your allergies, prior to the  procedure.  We must know immediately if you are taking blood thinners (like Coumadin/Warfarin) or if you are allergic to IV iodine contrast (dye).  We must know if you could possible be pregnant.  Possible side effects:   Bleeding from needle site  Infection (rare, may require surgery)  Nerve injury (rare)  Numbness & tingling (temporary)  A brief convulsion or seizure  Light-headedness (temporary)  Pain at injection site (several days)  Decreased blood pressure (temporary)  Weakness in the leg (temporary)   Call if you experience:   New onset weakness or numbness of an extremity below the injection site that last more than 8 hours.  Hives or difficulty breathing ( go to the emergency room)  Inflammation or drainage at the injection site  Any new symptoms which are concerning to you  Please note:  Although the local anesthetic injected can often make your back/ hip/ buttock/ leg feel good for several hours after the injections, the pain will likely return.  It takes 3-7 days for steroids to work in the sacroiliac area.  You may not notice any pain relief for at least that one week.  If effective, we will often do a series of three injections spaced 3-6 weeks apart to maximally decrease your pain.  After the initial series, we generally will wait some months before a repeat injection of the same type.  If you have any questions, please call [phone] [facility]

## 2018-06-03 ENCOUNTER — Ambulatory Visit: Payer: Self-pay | Admitting: Student in an Organized Health Care Education/Training Program

## 2018-06-04 ENCOUNTER — Other Ambulatory Visit: Payer: Self-pay | Admitting: Neurology

## 2018-06-04 DIAGNOSIS — G43011 Migraine without aura, intractable, with status migrainosus: Secondary | ICD-10-CM

## 2018-06-04 MED ORDER — SUMATRIPTAN SUCCINATE 50 MG PO TABS: 50.0000 mg | ORAL_TABLET | ORAL | 1 refills | Status: AC | PRN

## 2018-06-23 ENCOUNTER — Other Ambulatory Visit: Payer: Self-pay

## 2018-06-23 ENCOUNTER — Ambulatory Visit
Admission: RE | Admit: 2018-06-23 | Discharge: 2018-06-23 | Disposition: A | Discharge status: Home / Self Care | Payer: BC Managed Care – PPO | Source: Ambulatory Visit | Attending: Student in an Organized Health Care Education/Training Program | Admitting: Student in an Organized Health Care Education/Training Program

## 2018-06-23 ENCOUNTER — Encounter: Payer: Self-pay | Admitting: Student in an Organized Health Care Education/Training Program

## 2018-06-23 ENCOUNTER — Ambulatory Visit (HOSPITAL_BASED_OUTPATIENT_CLINIC_OR_DEPARTMENT_OTHER): Payer: BC Managed Care – PPO | Admitting: Student in an Organized Health Care Education/Training Program

## 2018-06-23 VITALS — BP 152/71 | HR 55 | Temp 98.0°F | Resp 16 | Ht 62.5 in | Wt 270.0 lb

## 2018-06-23 DIAGNOSIS — M545 Low back pain: Secondary | ICD-10-CM | POA: Insufficient documentation

## 2018-06-23 DIAGNOSIS — M47818 Spondylosis without myelopathy or radiculopathy, sacral and sacrococcygeal region: Secondary | ICD-10-CM

## 2018-06-23 DIAGNOSIS — Z880 Allergy status to penicillin: Secondary | ICD-10-CM | POA: Diagnosis not present

## 2018-06-23 DIAGNOSIS — Z881 Allergy status to other antibiotic agents status: Secondary | ICD-10-CM | POA: Insufficient documentation

## 2018-06-23 MED ORDER — OXYCODONE HCL 5 MG PO TABS: 5.0000 mg | ORAL_TABLET | Freq: Every day | ORAL | 0 refills | Status: AC | PRN

## 2018-06-23 MED ORDER — LACTATED RINGERS IV SOLN
1000.0000 mL | Freq: Once | INTRAVENOUS | Status: AC
  Administered 2018-06-23: 1000 mL via INTRAVENOUS

## 2018-06-23 MED ORDER — DEXAMETHASONE SODIUM PHOSPHATE 10 MG/ML IJ SOLN
10.0000 mg | Freq: Once | INTRAMUSCULAR | Status: AC
  Administered 2018-06-23: 10 mg

## 2018-06-23 MED ORDER — ROPIVACAINE HCL 2 MG/ML IJ SOLN
10.0000 mL | Freq: Once | INTRAMUSCULAR | Status: AC
  Administered 2018-06-23: 10 mL

## 2018-06-23 MED ORDER — FENTANYL CITRATE (PF) 100 MCG/2ML IJ SOLN
INTRAMUSCULAR | Status: AC
  Filled 2018-06-23: qty 2

## 2018-06-23 MED ORDER — ROPIVACAINE HCL 2 MG/ML IJ SOLN
INTRAMUSCULAR | Status: AC
  Filled 2018-06-23: qty 10

## 2018-06-23 MED ORDER — DEXAMETHASONE SODIUM PHOSPHATE 10 MG/ML IJ SOLN
INTRAMUSCULAR | Status: AC
  Filled 2018-06-23: qty 2

## 2018-06-23 MED ORDER — LIDOCAINE HCL 2 % IJ SOLN
20.0000 mL | Freq: Once | INTRAMUSCULAR | Status: AC
  Administered 2018-06-23: 400 mg

## 2018-06-23 MED ORDER — LIDOCAINE HCL 2 % IJ SOLN
INTRAMUSCULAR | Status: AC
  Filled 2018-06-23: qty 20

## 2018-06-23 MED ORDER — FENTANYL CITRATE (PF) 100 MCG/2ML IJ SOLN
25.0000 ug | INTRAMUSCULAR | Status: DC | PRN
  Administered 2018-06-23: 75 ug via INTRAVENOUS

## 2018-06-23 NOTE — Progress Notes (Signed)
Patient's Name: [name] [name]  MRN: [id number]  Referring Provider: [name], MD  DOB: 11-Sep-1965  PCP: [name], MD  DOS: 06/23/2018  Note by: [name], MD  Service setting: Ambulatory outpatient  Specialty: Interventional Pain Management  Patient type: Established  Location: [facility] (AMB) Pain Management Facility  Visit type: Interventional Procedure   Primary Reason for Visit: Interventional Pain Management Treatment. CC: Back Pain (lower)  Procedure:          Anesthesia, Analgesia, Anxiolysis:  Type: Therapeutic Sacroiliac Joint Steroid Injection          Region: Inferior Lumbosacral Region Level: PIIS (Posterior Inferior Iliac Spine) Laterality: Bilateral  Type: Moderate (Conscious) Sedation combined with Local Anesthesia Indication(s): Analgesia and Anxiety Route: Intravenous (IV) IV Access: Secured Sedation: Meaningful verbal contact was maintained at all times during the procedure  Local Anesthetic: Lidocaine 1-2%  Position: Prone           Indications: 1. SI joint arthritis    Pain Score: Pre-procedure: 7 /10 Post-procedure: 0-No pain/10  Pre-op Assessment:  [name] [name] is a 52 y.o. (year old), female patient, seen today for interventional treatment. She  has a past surgical history that includes Cesarean section (1989); Esophagogastroduodenoscopy (N/A, 11/21/2012); Colonoscopy (N/A, 11/21/2012); Incisional hernia repair (N/A, 12/17/2012); Insertion of mesh (N/A, 12/17/2012); Cardiac catheterization (N/A, 01/21/2015); Appendectomy (1990s); Hernia repair; Dilation and curettage of uterus (1985); Ovarian cyst removal (2007; 2014); Abdominal hysterectomy (2005); Colonoscopy with propofol (N/A, 09/02/2017); and Esophagogastroduodenoscopy (egd) with propofol (N/A, 09/02/2017). [name] [name] has a current medication list which includes the following prescription(s): albuterol, albuterol, cetirizine, clonazepam, dicyclomine, fluoxetine, fremanezumab-vfrm, gabapentin, lamotrigine,  omeprazole, oxycodone, ranitidine, sumatriptan, tizanidine, trazodone, and vitamin d (ergocalciferol), and the following Facility-Administered Medications: fentanyl, fremanezumab-vfrm, and lactated ringers. Her primarily concern today is the Back Pain (lower)  Initial Vital Signs:  Pulse/HCG Rate: (!) 50  Temp: 98.2 F (36.8 C) Resp: 16 BP: 120/68 SpO2: 99 %  BMI: Estimated body mass index is 48.6 kg/m as calculated from the following:   Height as of this encounter: 5' 2.5" (1.588 m).   Weight as of this encounter: 270 lb (122.5 kg).  Risk Assessment: Allergies: Reviewed. She is allergic to amoxicillin; penicillins; biaxin [clarithromycin]; erythromycin base; and zithromax [azithromycin].  Allergy Precautions: None required Coagulopathies: Reviewed. None identified.  Blood-thinner therapy: None at this time Active Infection(s): Reviewed. None identified. [name] [name] is afebrile  Site Confirmation: [name] [name] was asked to confirm the procedure and laterality before marking the site Procedure checklist: Completed Consent: Before the procedure and under the influence of no sedative(s), amnesic(s), or anxiolytics, the patient was informed of the treatment options, risks and possible complications. To fulfill our ethical and legal obligations, as recommended by the American Medical Association's Code of Ethics, I have informed the patient of my clinical impression; the nature and purpose of the treatment or procedure; the risks, benefits, and possible complications of the intervention; the alternatives, including doing nothing; the risk(s) and benefit(s) of the alternative treatment(s) or procedure(s); and the risk(s) and benefit(s) of doing nothing. The patient was provided information about the general risks and possible complications associated with the procedure. These may include, but are not limited to: failure to achieve desired goals, infection, bleeding, organ or nerve damage, allergic  reactions, paralysis, and death. In addition, the patient was informed of those risks and complications associated to the procedure, such as failure to decrease pain; infection; bleeding; organ or nerve damage with subsequent damage to sensory, motor, and/or autonomic  systems, resulting in permanent pain, numbness, and/or weakness of one or several areas of the body; allergic reactions; (i.e.: anaphylactic reaction); and/or death. Furthermore, the patient was informed of those risks and complications associated with the medications. These include, but are not limited to: allergic reactions (i.e.: anaphylactic or anaphylactoid reaction(s)); adrenal axis suppression; blood sugar elevation that in diabetics may result in ketoacidosis or comma; water retention that in patients with history of congestive heart failure may result in shortness of breath, pulmonary edema, and decompensation with resultant heart failure; weight gain; swelling or edema; medication-induced neural toxicity; particulate matter embolism and blood vessel occlusion with resultant organ, and/or nervous system infarction; and/or aseptic necrosis of one or more joints. Finally, the patient was informed that Medicine is not an exact science; therefore, there is also the possibility of unforeseen or unpredictable risks and/or possible complications that may result in a catastrophic outcome. The patient indicated having understood very clearly. We have given the patient no guarantees and we have made no promises. Enough time was given to the patient to ask questions, all of which were answered to the patient's satisfaction. [name] [name] has indicated that she wanted to continue with the procedure. Attestation: I, the ordering provider, attest that I have discussed with the patient the benefits, risks, side-effects, alternatives, likelihood of achieving goals, and potential problems during recovery for the procedure that I have provided informed  consent. Date  Time: 06/23/2018  8:38 AM  Pre-Procedure Preparation:  Monitoring: As per clinic protocol. Respiration, ETCO2, SpO2, BP, heart rate and rhythm monitor placed and checked for adequate function Safety Precautions: Patient was assessed for positional comfort and pressure points before starting the procedure. Time-out: I initiated and conducted the "Time-out" before starting the procedure, as per protocol. The patient was asked to participate by confirming the accuracy of the "Time Out" information. Verification of the correct person, site, and procedure were performed and confirmed by me, the nursing staff, and the patient. "Time-out" conducted as per Joint Commission's Universal Protocol (UP.01.01.01). Time: 0935  Description of Procedure:          Target Area: Inferior, posterior, aspect of the sacroiliac fissure Approach: Posterior, paraspinal, ipsilateral approach. Area Prepped: Entire Lower Lumbosacral Region Prepping solution: ChloraPrep (2% chlorhexidine gluconate and 70% isopropyl alcohol) Safety Precautions: Aspiration looking for blood return was conducted prior to all injections. At no point did we inject any substances, as a needle was being advanced. No attempts were made at seeking any paresthesias. Safe injection practices and needle disposal techniques used. Medications properly checked for expiration dates. SDV (single dose vial) medications used. Description of the Procedure: Protocol guidelines were followed. The patient was placed in position over the procedure table. The target area was identified and the area prepped in the usual manner. Skin & deeper tissues infiltrated with local anesthetic. Appropriate amount of time allowed to pass for local anesthetics to take effect. The procedure needle was advanced under fluoroscopic guidance into the sacroiliac joint until a firm endpoint was obtained. Proper needle placement secured. Negative aspiration confirmed. Solution  injected in intermittent fashion, asking for systemic symptoms every 0.5cc of injectate. The needles were then removed and the area cleansed, making sure to leave some of the prepping solution back to take advantage of its long term bactericidal properties. Vitals:   06/23/18 0930 06/23/18 0935 06/23/18 0940 06/23/18 0945  BP: 138/75 (!) 152/88 (!) 146/92 (!) 153/90  Pulse: (!) 50 (!) 56 62 (!) 55  Resp: 14 16 17  13  Temp:      TempSrc:      SpO2: 99% 97% (!) 40% (!) 36%  Weight:      Height:        Start Time: 0935 hrs. End Time:   hrs. Materials:  Needle(s) Type: Spinal Needle Gauge: 22G Length: 3.5-in Medication(s): Please see orders for medications and dosing details. 5 cc solution made of 4 cc of 0.2% ropivacaine, 1 cc of Decadron 10 mg/cc.  2.5 cc injected LEFT intra-articular, 2.5 cc injected periarticular left SI joint 5 cc solution made of 4 cc of 0.2% ropivacaine, 1 cc of Decadron 10 mg/cc.  2.5 cc injected RIGHT intra-articular, 2.5 cc injected periarticular RIGHT SI joint Imaging Guidance (Non-Spinal):          Type of Imaging Technique: Fluoroscopy Guidance (Non-Spinal) Indication(s): Assistance in needle guidance and placement for procedures requiring needle placement in or near specific anatomical locations not easily accessible without such assistance. Exposure Time: Please see nurses notes. Contrast: None used. Fluoroscopic Guidance: I was personally present during the use of fluoroscopy. "Tunnel Vision Technique" used to obtain the best possible view of the target area. Parallax error corrected before commencing the procedure. "Direction-depth-direction" technique used to introduce the needle under continuous pulsed fluoroscopy. Once target was reached, antero-posterior, oblique, and lateral fluoroscopic projection used confirm needle placement in all planes. Images permanently stored in EMR. Interpretation: No contrast injected.  Antibiotic Prophylaxis:    Anti-infectives (From admission, onward)   None     Indication(s): None identified  Post-operative Assessment:  Post-procedure Vital Signs:  Pulse/HCG Rate: (!) 55  Temp: 98.2 F (36.8 C) Resp: 13 BP: (!) 153/90 SpO2: (!) 36 %  EBL: None  Complications: No immediate post-treatment complications observed by team, or reported by patient.  Note: The patient tolerated the entire procedure well. A repeat set of vitals were taken after the procedure and the patient was kept under observation following institutional policy, for this type of procedure. Post-procedural neurological assessment was performed, showing return to baseline, prior to discharge. The patient was provided with post-procedure discharge instructions, including a section on how to identify potential problems. Should any problems arise concerning this procedure, the patient was given instructions to immediately contact us, at any time, without hesitation. In any case, we plan to contact the patient by telephone for a follow-up status report regarding this interventional procedure.  Comments:  No additional relevant information.  Plan of Care   Imaging Orders     DG C-Arm 1-60 Min-No Report Procedure Orders    No procedure(s) ordered today   Also refill Oxycodone, PMP checked and appropriate.  Medications ordered for procedure: Meds ordered this encounter  Medications  . lactated ringers infusion 1,000 mL  . fentaNYL (SUBLIMAZE) injection 25-100 mcg    Make sure Narcan is available in the pyxis when using this medication. In the event of respiratory depression (RR< 8/min): Titrate NARCAN (naloxone) in increments of 0.1 to 0.2 mg IV at 2-3 minute intervals, until desired degree of reversal.  . ropivacaine (PF) 2 mg/mL (0.2%) (NAROPIN) injection 10 mL  . lidocaine (XYLOCAINE) 2 % (with pres) injection 400 mg  . dexamethasone (DECADRON) injection 10 mg  . dexamethasone (DECADRON) injection 10 mg  . oxyCODONE (OXY  IR/ROXICODONE) 5 MG immediate release tablet    Sig: Take 1 tablet (5 mg total) by mouth daily as needed for severe pain.    Dispense:  30 tablet    Refill:  0   Medications administered:  We administered lactated ringers, fentaNYL, ropivacaine (PF) 2 mg/mL (0.2%), lidocaine, dexamethasone, and dexamethasone.  See the medical record for exact dosing, route, and time of administration.  Disposition: Discharge home  Discharge Date & Time: 06/23/2018;   hrs.   Physician-requested Follow-up: Return in about 6 weeks (around 08/04/2018) for Post Procedure Evaluation, Medication Management.  Future Appointments  Date Time Provider Shannon  07/29/2018  7:30 AM Ward Givens, NP [facility] None   Primary Care Physician: [name], MD Location: [facility] Note by: [name], MD Date: 06/23/2018; Time: 9:52 AM  Disclaimer:  Medicine is not an exact science. The only guarantee in medicine is that nothing is guaranteed. It is important to note that the decision to proceed with this intervention was based on the information collected from the patient. The Data and conclusions were drawn from the patient's questionnaire, the interview, and the physical examination. Because the information was provided in large part by the patient, it cannot be guaranteed that it has not been purposely or unconsciously manipulated. Every effort has been made to obtain as much relevant data as possible for this evaluation. It is important to note that the conclusions that lead to this procedure are derived in large part from the available data. Always take into account that the treatment will also be dependent on availability of resources and existing treatment guidelines, considered by other Pain Management Practitioners as being common knowledge and practice, at the time of the intervention. For Medico-Legal purposes, it is also important to point out that variation in procedural  techniques and pharmacological choices are the acceptable norm. The indications, contraindications, technique, and results of the above procedure should only be interpreted and judged by a Board-Certified Interventional Pain Specialist with extensive familiarity and expertise in the same exact procedure and technique.

## 2018-06-23 NOTE — Patient Instructions (Addendum)
You have been escribed oxycodone and it should be at your pharmacy for pick up.   Post-procedure Information What to expect: Most procedures involve the use of a local anesthetic (numbing medicine), and a steroid (anti-inflammatory medicine).  The local anesthetics may cause temporary numbness and weakness of the legs or arms, depending on the location of the block. This numbness/weakness may last 4-6 hours, depending on the local anesthetic used. In rare instances, it can last up to 24 hours. While numb, you must be very careful not to injure the extremity.  After any procedure, you could expect the pain to get better within 15-20 minutes. This relief is temporary and may last 4-6 hours. Once the local anesthetics wears off, you could experience discomfort, possibly more than usual, for up to 10 (ten) days. In the case of radiofrequencies, it may last up to 6 weeks. Surgeries may take up to 8 weeks for the healing process. The discomfort is due to the irritation caused by needles going through skin and muscle. To minimize the discomfort, we recommend using ice the first day, and heat from then on. The ice should be applied for 15 minutes on, and 15 minutes off. Keep repeating this cycle until bedtime. Avoid applying the ice directly to the skin, to prevent frostbite. Heat should be used daily, until the pain improves (4-10 days). Be careful not to burn yourself.  Occasionally you may experience muscle spasms or cramps. These occur as a consequence of the irritation caused by the needle sticks to the muscle and the blood that will inevitably be lost into the surrounding muscle tissue. Blood tends to be very irritating to tissues, which tend to react by going into spasm. These spasms may start the same day of your procedure, but they may also take days to develop. This late onset type of spasm or cramp is usually caused by electrolyte imbalances triggered by the steroids, at the level of the kidney. Cramps  and spasms tend to respond well to muscle relaxants, multivitamins (some are triggered by the procedure, but may have their origins in vitamin deficiencies), and "Gatorade", or any sports drinks that can replenish any electrolyte imbalances. (If you are a diabetic, ask your pharmacist to get you a sugar-free brand.) Warm showers or baths may also be helpful. Stretching exercises are highly recommended. General Instructions:  Be alert for signs of possible infection: redness, swelling, heat, red streaks, elevated temperature, and/or fever. These typically appear 4 to 6 days after the procedure. Immediately notify your doctor if you experience unusual bleeding, difficulty breathing, or loss of bowel or bladder control. If you experience increased pain, do not increase your pain medicine intake, unless instructed by your pain physician. Post-Procedure Care:  Be careful in moving about. Muscle spasms in the area of the injection may occur. Applying ice or heat to the area is often helpful. The incidence of spinal headaches after epidural injections ranges between 1.4% and 6%. If you develop a headache that does not seem to respond to conservative therapy, please let your physician know. This can be treated with an epidural blood patch.   Post-procedure numbness or redness is to be expected, however it should average 4 to 6 hours. If numbness and weakness of your extremities begins to develop 4 to 6 hours after your procedure, and is felt to be progressing and worsening, immediately contact your physician.   Diet:  If you experience nausea, do not eat until this sensation goes away. If you had  a "Stellate Ganglion Block" for upper extremity "Reflex Sympathetic Dystrophy", do not eat or drink until your hoarseness goes away. In any case, always start with liquids first and if you tolerate them well, then slowly progress to more solid foods. Activity:  For the first 4 to 6 hours after the procedure, use caution  in moving about as you may experience numbness and/or weakness. Use caution in cooking, using household electrical appliances, and climbing steps. If you need to reach your Doctor call our office: [phone] Monday-Thursday 8:00 am - 4:00 PM    Fridays: Closed     In case of an emergency: In case of emergency, call 911 or go to the nearest emergency room and have the physician there call us.  Interpretation of Procedure Every nerve block has two components: a diagnostic component, and a treatment component. Unrealistic expectations are the most common causes of "perceived failure".  In a perfect world, a single nerve block should be able to completely and permanently eliminate the pain. Sadly, the world is not perfect.  Most pain management nerve blocks are performed using local anesthetics and steroids. Steroids are responsible for any long-term benefit that you may experience. Their purpose is to decrease any chronic swelling that may exist in the area. Steroids begin to work immediately after being injected. However, most patients will not experience any benefits until 5 to 10 days after the injection, when the swelling has come down to the point where they can tell a difference. Steroids will only help if there is swelling to be treated. As such, they can assist with the diagnosis. If effective, they suggest an inflammatory component to the pain, and if ineffective, they rule out inflammation as the main cause or component of the problem. If the problem is one of mechanical compression, you will get no benefit from those steroids.   In the case of local anesthetics, they have a crucial role in the diagnosis of your condition. Most will begin to work within15 to 20 minutes after injection. The duration will depend on the type used (short- vs. Long-acting). It is of outmost importance that patients keep tract of their pain, after the procedure. To assist with this matter, a "Post-procedure Pain  Diary" is provided. Make sure to complete it and to bring it back to your follow-up appointment.  As long as the patient keeps accurate, detailed records of their symptoms after every procedure, and returns to have those interpreted, every procedure will provide [name] with invaluable information. Even a block that does not provide the patient with any relief, will always provide [name] with information about the mechanism and the origin of the pain. The only time a nerve block can be considered a waste of time is when patients do not keep track of the results, or do not keep their post-procedure appointment.  Reporting the results back to your physician The Pain Score  Pain is a subjective complaint. It cannot be seen, touched, or measured. We depend entirely on the patient's report of the pain in order to assess your condition and treatment. To evaluate the pain, we use a pain scale, where "0" means "No Pain", and a "10" is "the worst possible pain that you can even imagine" (i.e. something like been eaten alive by a shark or being torn apart by a lion).   You will frequently be asked to rate your pain. Please be as accurate, remember that medical decisions will be based on your responses. Please do  not rate your pain above a 10. Doing so is actually interpreted as "symptom magnification" (exaggeration), as well as lack of understanding with regards to the scale. To put this into perspective, when you tell us that your pain is at a 10 (ten), what you are saying is that there is nothing we can do to make this pain any worse. (Carefully think about that.)

## 2018-06-23 NOTE — Progress Notes (Signed)
Safety precautions to be maintained throughout the outpatient stay will include: orient to surroundings, keep bed in low position, maintain call bell within reach at all times, provide assistance with transfer out of bed and ambulation.  

## 2018-06-24 ENCOUNTER — Telehealth: Payer: Self-pay

## 2018-06-24 NOTE — Telephone Encounter (Signed)
Denies any needs at this time, states she is OK. Instructed to call with any problems of complications.

## 2018-07-05 ENCOUNTER — Other Ambulatory Visit: Payer: Self-pay | Admitting: Neurology

## 2018-07-08 ENCOUNTER — Ambulatory Visit: Payer: BC Managed Care – PPO | Admitting: Student in an Organized Health Care Education/Training Program

## 2018-07-11 ENCOUNTER — Other Ambulatory Visit: Payer: Self-pay | Admitting: Neurology

## 2018-07-11 NOTE — Telephone Encounter (Signed)
Lamictal refilled x 1 month. Note to pharmacy, patient must keep FU in Dec 2019.

## 2018-07-14 ENCOUNTER — Telehealth: Payer: Self-pay | Admitting: Adult Health

## 2018-07-14 NOTE — Telephone Encounter (Signed)
Spoke with Ms. Bendorf and she stated that since she has been home she's twitching, shaking, balance has been off. She stated that she did have one fall no injuries and she feels weak, she also mentioned that she has a massive headache at present. Patient stated that this has been happening since she been taken off her Vimpat and Topamax. Please advise

## 2018-07-14 NOTE — Telephone Encounter (Signed)
She was referred to [facility] for seizures. Was seen by Dr. Ginny Forth. She had Video EEG monitoring and told to follow-up with him in 2 months. She should call his office about twitching, shaking and balance.   We had her on Topamax for migraines. We did not tell her to stop the medication? Who advised this? She may need to be restarted but I suggest she call Dr. Ginny Forth first to advise him of seizure like activity. If he is ok we can restart Topamax for migraines.

## 2018-07-14 NOTE — Telephone Encounter (Signed)
Spoke with [name] [name] and she stated that Dr. Ginny Forth was the one who took her off the Vimpat and Topamax. She verbalized understanding your instructions and stated that she would call him about her seizures. No other questions or concerns at this time.

## 2018-07-14 NOTE — Telephone Encounter (Signed)
Unable to get in contact with Mrs. Lackey. I left a voicemail asking her to return my call.

## 2018-07-14 NOTE — Telephone Encounter (Signed)
Pt has called to inform that for 5 days she was at [facility], since then she has been taken off of her Topomax & her Vimpat.  Pt states she is now having petite seizures, she is asking for a call to discuss

## 2018-07-29 ENCOUNTER — Encounter: Payer: Self-pay | Admitting: Adult Health

## 2018-07-29 ENCOUNTER — Ambulatory Visit: Payer: BC Managed Care – PPO | Admitting: Adult Health

## 2018-07-29 VITALS — BP 117/71 | HR 67 | Ht 62.5 in | Wt 284.0 lb

## 2018-07-29 DIAGNOSIS — R569 Unspecified convulsions: Secondary | ICD-10-CM | POA: Diagnosis not present

## 2018-07-29 DIAGNOSIS — R519 Headache, unspecified: Secondary | ICD-10-CM

## 2018-07-29 DIAGNOSIS — R51 Headache: Secondary | ICD-10-CM | POA: Diagnosis not present
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
## 2018-07-29 NOTE — Patient Instructions (Signed)
Your Plan:  Continue Ajovy Continue Clonazepam If your symptoms worsen or you develop new symptoms please let us know.    Thank you for coming to see [name] at [facility]. I hope we have been able to provide you high quality care today.  You may receive a patient satisfaction survey over the next few weeks. We would appreciate your feedback and comments so that we may continue to improve ourselves and the health of our patients.

## 2018-08-04 ENCOUNTER — Ambulatory Visit
Payer: BC Managed Care – PPO | Attending: Student in an Organized Health Care Education/Training Program | Admitting: Student in an Organized Health Care Education/Training Program

## 2018-08-04 ENCOUNTER — Ambulatory Visit: Payer: Self-pay | Admitting: Student in an Organized Health Care Education/Training Program

## 2018-08-04 ENCOUNTER — Encounter: Payer: Self-pay | Admitting: Student in an Organized Health Care Education/Training Program

## 2018-08-04 ENCOUNTER — Other Ambulatory Visit: Payer: Self-pay

## 2018-08-04 VITALS — BP 113/72 | HR 61 | Temp 98.7°F | Resp 18 | Ht 62.5 in | Wt 270.0 lb

## 2018-08-04 DIAGNOSIS — Z79891 Long term (current) use of opiate analgesic: Secondary | ICD-10-CM | POA: Diagnosis not present

## 2018-08-04 DIAGNOSIS — Z880 Allergy status to penicillin: Secondary | ICD-10-CM | POA: Diagnosis not present

## 2018-08-04 DIAGNOSIS — M7062 Trochanteric bursitis, left hip: Secondary | ICD-10-CM | POA: Insufficient documentation

## 2018-08-04 DIAGNOSIS — M1990 Unspecified osteoarthritis, unspecified site: Secondary | ICD-10-CM | POA: Insufficient documentation

## 2018-08-04 DIAGNOSIS — K219 Gastro-esophageal reflux disease without esophagitis: Secondary | ICD-10-CM | POA: Insufficient documentation

## 2018-08-04 DIAGNOSIS — Z8249 Family history of ischemic heart disease and other diseases of the circulatory system: Secondary | ICD-10-CM | POA: Diagnosis not present

## 2018-08-04 DIAGNOSIS — Z8673 Personal history of transient ischemic attack (TIA), and cerebral infarction without residual deficits: Secondary | ICD-10-CM | POA: Insufficient documentation

## 2018-08-04 DIAGNOSIS — Z881 Allergy status to other antibiotic agents status: Secondary | ICD-10-CM | POA: Diagnosis not present

## 2018-08-04 DIAGNOSIS — F419 Anxiety disorder, unspecified: Secondary | ICD-10-CM | POA: Insufficient documentation

## 2018-08-04 DIAGNOSIS — G40909 Epilepsy, unspecified, not intractable, without status epilepticus: Secondary | ICD-10-CM | POA: Diagnosis not present

## 2018-08-04 DIAGNOSIS — G894 Chronic pain syndrome: Secondary | ICD-10-CM | POA: Diagnosis not present

## 2018-08-04 DIAGNOSIS — Z90710 Acquired absence of both cervix and uterus: Secondary | ICD-10-CM | POA: Insufficient documentation

## 2018-08-04 DIAGNOSIS — M47818 Spondylosis without myelopathy or radiculopathy, sacral and sacrococcygeal region: Secondary | ICD-10-CM | POA: Diagnosis not present

## 2018-08-04 DIAGNOSIS — M533 Sacrococcygeal disorders, not elsewhere classified: Secondary | ICD-10-CM | POA: Insufficient documentation

## 2018-08-04 DIAGNOSIS — J4540 Moderate persistent asthma, uncomplicated: Secondary | ICD-10-CM | POA: Insufficient documentation

## 2018-08-04 DIAGNOSIS — G8929 Other chronic pain: Secondary | ICD-10-CM

## 2018-08-04 DIAGNOSIS — Z6841 Body Mass Index (BMI) 40.0 and over, adult: Secondary | ICD-10-CM | POA: Insufficient documentation

## 2018-08-04 DIAGNOSIS — Z79899 Other long term (current) drug therapy: Secondary | ICD-10-CM | POA: Diagnosis not present

## 2018-08-04 MED ORDER — OXYCODONE HCL 5 MG PO TABS: 5.0000 mg | ORAL_TABLET | Freq: Every day | ORAL | 0 refills | Status: DC | PRN

## 2018-08-04 NOTE — Patient Instructions (Signed)
1. Annual UDS 2. Repeat SI j and GTB injection 3. Refill of Oxycodone for 3 months, e prescribe

## 2018-08-04 NOTE — Progress Notes (Signed)
Patient's Name: [name] [name]  MRN: [id number]  Referring Provider: [name], MD  DOB: 25-May-1966  PCP: [name], MD  DOS: 08/04/2018  Note by: [name], MD  Service setting: Ambulatory outpatient  Specialty: Interventional Pain Management  Location: [facility] (AMB) Pain Management Facility    Patient type: Established   Primary Reason(s) for Visit: Encounter for post-procedure evaluation of chronic illness with mild to moderate exacerbation CC: No chief complaint on file.  HPI  [name] [name] is a 52 y.o. year old, female patient, who comes today for a post-procedure evaluation. She has Incisional hernia, without obstruction or gangrene; GERD (gastroesophageal reflux disease); Obesity, Class III, BMI 40-49.9 (morbid obesity) ([place]); Generalized convulsive epilepsy ([place]); History of recent fall; Paresthesia; CVA (cerebral infarction); Right sided weakness; Seizure disorder ([place]); Chronic headaches; Atrial fibrillation ([place]); Right arm weakness; Asthma, mild intermittent; TIA (transient ischemic attack); Morbid obesity ([place]); Intractable migraine with aura with status migrainosus; Chronic post-traumatic headache, not intractable; Migraine with aura and with status migrainosus, not intractable; Subarachnoid hemorrhage following injury, with loss of consciousness ([place]); TBI (traumatic brain injury) ([place]); Partial symptomatic epilepsy with complex partial seizures, not intractable, without status epilepticus ([place]); Headache; DDD (degenerative disc disease), lumbosacral; Lumbosacral radiculopathy; Surgical menopause on hormone replacement therapy; Incontinence in female; Status post TAH-BSO; Prediabetes; Vitamin D deficiency; Agitation; Pseudoseizure; Seizure-like activity ([place]); Seizures ([place]); Other parasomnia; Intractable migraine without aura and without status migrainosus; Nocturnal seizures ([place]); Anxiety; Asthma; Cerebral artery occlusion with cerebral infarction ([place]); Generalized muscle weakness;  History of CVA (cerebrovascular accident) without residual deficits; Other symptoms and signs involving the musculoskeletal system; Skin sensation disturbance; Migraine with aura; Migraines; Convulsions ([place]); Class 3 obesity in adult; Transient cerebral ischemia; Lumbar spondylosis; Chronic pain syndrome; Chronic upper back pain; Chest pain; Atypical chest pain; T wave inversion in EKG; Class 3 severe obesity due to excess calories with serious comorbidity and body mass index (BMI) of 45.0 to 49.9 in adult [facility]); Moderate persistent asthma; and Chronic migraine with aura on their problem list. Her primarily concern today is the No chief complaint on file.  Pain Assessment: Location: Lower, Mid, Right, Left Back Radiating: Radiates to left hip only  Onset:  Greater than 3 months ago Duration: Chronic pain Quality:  Achy, throbbing Severity: 8 /10 (subjective, self-reported pain score)  Note: Reported level is inconsistent with clinical observations.                         When using our objective Pain Scale, levels between 6 and 10/10 are said to belong in an emergency room, as it progressively worsens from a 6/10, described as severely limiting, requiring emergency care not usually available at an outpatient pain management facility. At a 6/10 level, communication becomes difficult and requires great effort. Assistance to reach the emergency department may be required. Facial flushing and profuse sweating along with potentially dangerous increases in heart rate and blood pressure will be evident. Effect on ADL:  Limits ability to walk for an extended period of time Timing:  Worse in the evening Modifying factors: Procedure, ice, heat and bought a new frim mattress  BP: 113/72  HR: 61  [name] [name] comes in today for post-procedure evaluation.  Further details on both, my assessment(s), as well as the proposed treatment plan, please see below.  Post-Procedure Assessment  06/23/2018 Procedure:  Bilateral SI joint injection Pre-procedure pain score:  7/10 Post-procedure pain score: 0/10         Influential  Factors: BMI: 48.60 kg/m Intra-procedural challenges: None observed.         Assessment challenges: None detected.              Reported side-effects: None.        Post-procedural adverse reactions or complications: None reported         Sedation: Please see nurses note. When no sedatives are used, the analgesic levels obtained are directly associated to the effectiveness of the local anesthetics. However, when sedation is provided, the level of analgesia obtained during the initial 1 hour following the intervention, is believed to be the result of a combination of factors. These factors may include, but are not limited to: 1. The effectiveness of the local anesthetics used. 2. The effects of the analgesic(s) and/or anxiolytic(s) used. 3. The degree of discomfort experienced by the patient at the time of the procedure. 4. The patients ability and reliability in recalling and recording the events. 5. The presence and influence of possible secondary gains and/or psychosocial factors. Reported result: Relief experienced during the 1st hour after the procedure: 100 % (Ultra-Short Term Relief)            Interpretative annotation: Clinically appropriate result. Analgesia during this period is likely to be Local Anesthetic and/or IV Sedative (Analgesic/Anxiolytic) related.          Effects of local anesthetic: The analgesic effects attained during this period are directly associated to the localized infiltration of local anesthetics and therefore cary significant diagnostic value as to the etiological location, or anatomical origin, of the pain. Expected duration of relief is directly dependent on the pharmacodynamics of the local anesthetic used. Long-acting (4-6 hours) anesthetics used.  Reported result: Relief during the next 4 to 6 hour after the procedure: 100 % (Short-Term Relief)             Interpretative annotation: Clinically appropriate result. Analgesia during this period is likely to be Local Anesthetic-related.          Long-term benefit: Defined as the period of time past the expected duration of local anesthetics (1 hour for short-acting and 4-6 hours for long-acting). With the possible exception of prolonged sympathetic blockade from the local anesthetics, benefits during this period are typically attributed to, or associated with, other factors such as analgesic sensory neuropraxia, antiinflammatory effects, or beneficial biochemical changes provided by agents other than the local anesthetics.  Reported result: Extended relief following procedure: 60 % (Long-Term Relief)            Interpretative annotation: Clinically possible results. Good relief. No permanent benefit expected. Inflammation plays a part in the etiology to the pain.          Current benefits: Defined as reported results that persistent at this point in time.   Analgesia: 25-50 %            Function: Somewhat improved ROM: Somewhat improved Interpretative annotation: Recurrence of symptoms. No permanent benefit expected. Effective diagnostic intervention.          Interpretation: Results would suggest a successful diagnostic and therapeutic intervention.                  Plan:  Repeat treatment or therapy and compare extent and duration of benefits.  Patient also complaining of lateral hip pain secondary to trochanteric bursitis, will repeat GTB injections bilaterally as well.                Laboratory Chemistry  Inflammation Markers (CRP:  Acute Phase) (ESR: Chronic Phase) Lab Results  Component Value Date   LATICACIDVEN 0.54 09/05/2014                         Rheumatology Markers No results found for: RF, ANA, LABURIC, URICUR, LYMEIGGIGMAB, LYMEABIGMQN, HLAB27                      Renal Function Markers Lab Results  Component Value Date   BUN 16 11/20/2017   CREATININE 0.87 11/20/2017    BCR 14 11/13/2017   GFRAA >60 11/20/2017   GFRNONAA >60 11/20/2017                             Hepatic Function Markers Lab Results  Component Value Date   AST 16 11/20/2017   ALT 15 11/20/2017   ALBUMIN 3.4 (L) 11/20/2017   ALKPHOS 93 11/20/2017   AMYLASE 54 11/18/2012   LIPASE 11 11/23/2016                        Electrolytes Lab Results  Component Value Date   NA 140 11/20/2017   K 3.6 11/20/2017   CL 110 11/20/2017   CALCIUM 8.6 (L) 11/20/2017   MG 1.8 09/26/2014                        Neuropathy Markers Lab Results  Component Value Date   VITAMINB12 491 09/10/2016   FOLATE >20.0 09/10/2016   HGBA1C 5.5 10/09/2016   HIV Non Reactive 11/19/2017                        CNS Tests No results found for: COLORCSF, APPEARCSF, RBCCOUNTCSF, WBCCSF, POLYSCSF, LYMPHSCSF, EOSCSF, PROTEINCSF, GLUCCSF, JCVIRUS, CSFOLI, IGGCSF                      Bone Pathology Markers Lab Results  Component Value Date   VD25OH 23.5 (L) 10/09/2016   VD125OH2TOT 47 03/09/2015   [id number] 24 03/09/2015   [id number] 23 03/09/2015                         Coagulation Parameters Lab Results  Component Value Date   INR 1.04 02/05/2016   LABPROT 13.8 02/05/2016   APTT 35 02/05/2016   PLT 190 11/20/2017   DDIMER 1.20 (H) 11/19/2017   LABHEMA Note: 01/18/2016                        Cardiovascular Markers Lab Results  Component Value Date   BNP 30.9 10/01/2014   CKTOTAL 101 05/29/2007   CKMB 1.2 05/29/2007   TROPONINI <0.03 11/20/2017   HGB 12.7 11/20/2017   HCT 38.7 11/20/2017                         CA Markers No results found for: CEA, CA125, LABCA2                      Note: Lab results reviewed.  Recent Diagnostic Imaging Results  DG C-Arm 1-60 Min-No Report Fluoroscopy was utilized by the requesting physician.  No radiographic  interpretation.   Complexity Note: Imaging results reviewed. Results shared with Ms. Caserta, using Layman's terms.  Meds   Current Outpatient Medications:  .  AJOVY 225 MG/1.5ML SOSY, INJECT CONTENTS OF 1 SYRINGE (225 MG) INTO THE SKIN EVERY 30 DAYS, Disp: 1.5 Syringe, Rfl: 7 .  albuterol (PROVENTIL HFA;VENTOLIN HFA) 108 (90 BASE) MCG/ACT inhaler, Inhale 2 puffs into the lungs every 6 (six) hours as needed for wheezing or shortness of breath. , Disp: , Rfl:  .  albuterol (PROVENTIL) (2.5 MG/3ML) 0.083% nebulizer solution, Take 2.5 mg by nebulization every 6 (six) hours as needed for wheezing or shortness of breath., Disp: , Rfl:  .  cetirizine (ZYRTEC) 10 MG tablet, Take 10 mg by mouth daily as needed., Disp: , Rfl: 1 .  clonazePAM (KLONOPIN) 1 MG tablet, TAKE 1 TABLET BY MOUTH AT NIGHT AS NEEDED, USE AFTER FACETTE INJECTIONS, Disp: 90 tablet, Rfl: 0 .  dicyclomine (BENTYL) 20 MG tablet, Take 1 tablet by mouth every 6 (six) hours as needed., Disp: , Rfl:  .  FLUoxetine (PROZAC) 40 MG capsule, Take 1 capsule (40 mg total) by mouth daily., Disp: 90 capsule, Rfl: 1 .  gabapentin (NEURONTIN) 300 MG capsule, TAKE 1 CAPSULE (300 MG TOTAL) AT BEDTIME BY MOUTH., Disp: 90 capsule, Rfl: 3 .  lamoTRIgine (LAMICTAL) 150 MG tablet, TAKE 1 TABLET (150 MG TOTAL) 2 (TWO) TIMES DAILY BY MOUTH., Disp: 60 tablet, Rfl: 0 .  omeprazole (PRILOSEC) 40 MG capsule, Take 40 mg by mouth daily., Disp: , Rfl:  .  ranitidine (ZANTAC) 300 MG tablet, Take 300 mg by mouth daily as needed for heartburn. , Disp: , Rfl:  .  SUMAtriptan (IMITREX) 50 MG tablet, Take 1 tablet (50 mg total) by mouth every 2 (two) hours as needed for migraine. May repeat in 2 hours if headache persists or recurs. Do not take more then 2 in one day or 12 in 1 month., Disp: 12 tablet, Rfl: 1 .  tiZANidine (ZANAFLEX) 4 MG tablet, Take 1 tablet (4 mg total) by mouth every 8 (eight) hours as needed for muscle spasms., Disp: 90 tablet, Rfl: 2 .  Vitamin D, Ergocalciferol, (DRISDOL) 50000 units CAPS capsule, Take 1 capsule (50,000 Units total) by mouth every Tuesday., Disp:  30 capsule, Rfl: 0 .  oxyCODONE (OXY IR/ROXICODONE) 5 MG immediate release tablet, Take 1 tablet (5 mg total) by mouth daily as needed for severe pain. Rx to last for 90 days., Disp: 90 tablet, Rfl: 0  ROS  Constitutional: Denies any fever or chills Gastrointestinal: No reported hemesis, hematochezia, vomiting, or acute GI distress Musculoskeletal: Denies any acute onset joint swelling, redness, loss of ROM, or weakness Neurological: No reported episodes of acute onset apraxia, aphasia, dysarthria, agnosia, amnesia, paralysis, loss of coordination, or loss of consciousness  Allergies  Ms. Rahm is allergic to amoxicillin; penicillins; biaxin [clarithromycin]; erythromycin base; and zithromax [azithromycin].  PFSH  Drug: Ms. Marsan  reports that she does not use drugs. Alcohol:  reports that she does not drink alcohol. Tobacco:  reports that she has never smoked. She has never used smokeless tobacco. Medical:  has a past medical history of Anginal pain ([place]), Anxiety, Asthma, Atrial fibrillation ([place]), Chest pain, Chronic back pain, Chronic bronchitis ([place]), Complication of anesthesia, Daily headache, Epilepsy ([place]), Fibroid, GERD (gastroesophageal reflux disease), Head injury (1975; 2015), Joint pain, Migraine, Obesity, Class III, BMI 40-49.9 (morbid obesity) ([place]) (12/21/2012), Ovarian cyst, Pre-diabetes, Seizures ([place]), Shortness of breath dyspnea, TBI (traumatic brain injury) ([place]) (2015), TIA (transient ischemic attack) (04/2015?), and Vitamin D deficiency. Surgical: Ms. Schlechter  has a past surgical history  that includes Cesarean section (1989); Esophagogastroduodenoscopy (N/A, 11/21/2012); Colonoscopy (N/A, 11/21/2012); Incisional hernia repair (N/A, 12/17/2012); Insertion of mesh (N/A, 12/17/2012); Cardiac catheterization (N/A, 01/21/2015); Appendectomy (1990s); Hernia repair; Dilation and curettage of uterus (1985); Ovarian cyst removal (2007; 2014); Abdominal hysterectomy (2005); Colonoscopy with  propofol (N/A, 09/02/2017); and Esophagogastroduodenoscopy (egd) with propofol (N/A, 09/02/2017). Family: family history includes Breast cancer in her cousin; COPD in her father; Depression in her mother; Diabetes in her father; Emphysema in her father; Heart attack in her father; Heart disease in her father and mother; Hypertension in her father and mother.  Constitutional Exam  General appearance: Well nourished, well developed, and well hydrated. In no apparent acute distress Vitals:   08/04/18 1343  BP: 113/72  Pulse: 61  Resp: 18  Temp: 98.7 F (37.1 C)  SpO2: 98%  Weight: 270 lb (122.5 kg)  Height: 5' 2.5" (1.588 m)   BMI Assessment: Estimated body mass index is 48.6 kg/m as calculated from the following:   Height as of this encounter: 5' 2.5" (1.588 m).   Weight as of this encounter: 270 lb (122.5 kg).  BMI interpretation table: BMI level Category Range association with higher incidence of chronic pain  <18 kg/m2 Underweight   18.5-24.9 kg/m2 Ideal body weight   25-29.9 kg/m2 Overweight Increased incidence by 20%  30-34.9 kg/m2 Obese (Class I) Increased incidence by 68%  35-39.9 kg/m2 Severe obesity (Class II) Increased incidence by 136%  >40 kg/m2 Extreme obesity (Class III) Increased incidence by 254%   Patient's current BMI Ideal Body weight  Body mass index is 48.6 kg/m. Ideal body weight: 51.3 kg (112 lb 15.8 oz) Adjusted ideal body weight: 79.7 kg (175 lb 12.7 oz)   BMI Readings from Last 4 Encounters:  08/04/18 48.60 kg/m  07/29/18 51.12 kg/m  06/23/18 48.60 kg/m  05/29/18 48.47 kg/m   Wt Readings from Last 4 Encounters:  08/04/18 270 lb (122.5 kg)  07/29/18 284 lb (128.8 kg)  06/23/18 270 lb (122.5 kg)  05/29/18 265 lb (120.2 kg)  Psych/Mental status: Alert, oriented x 3 (person, place, & time)       Eyes: PERLA Respiratory: No evidence of acute respiratory distress  Cervical Spine Area Exam  Skin & Axial Inspection: No masses, redness, edema,  swelling, or associated skin lesions Alignment: Symmetrical Functional ROM: Unrestricted ROM      Stability: No instability detected Muscle Tone/Strength: Functionally intact. No obvious neuro-muscular anomalies detected. Sensory (Neurological): Unimpaired Palpation: No palpable anomalies              Upper Extremity (UE) Exam    Side: Right upper extremity  Side: Left upper extremity  Skin & Extremity Inspection: Skin color, temperature, and hair growth are WNL. No peripheral edema or cyanosis. No masses, redness, swelling, asymmetry, or associated skin lesions. No contractures.  Skin & Extremity Inspection: Skin color, temperature, and hair growth are WNL. No peripheral edema or cyanosis. No masses, redness, swelling, asymmetry, or associated skin lesions. No contractures.  Functional ROM: Unrestricted ROM          Functional ROM: Unrestricted ROM          Muscle Tone/Strength: Functionally intact. No obvious neuro-muscular anomalies detected.  Muscle Tone/Strength: Functionally intact. No obvious neuro-muscular anomalies detected.  Sensory (Neurological): Unimpaired          Sensory (Neurological): Unimpaired          Palpation: No palpable anomalies              Palpation:  No palpable anomalies              Provocative Test(s):  Phalen's test: deferred Tinel's test: deferred Apley's scratch test (touch opposite shoulder):  Action 1 (Across chest): deferred Action 2 (Overhead): deferred Action 3 (LB reach): deferred   Provocative Test(s):  Phalen's test: deferred Tinel's test: deferred Apley's scratch test (touch opposite shoulder):  Action 1 (Across chest): deferred Action 2 (Overhead): deferred Action 3 (LB reach): deferred    Thoracic Spine Area Exam  Skin & Axial Inspection: No masses, redness, or swelling Alignment: Symmetrical Functional ROM: Unrestricted ROM Stability: No instability detected Muscle Tone/Strength: Functionally intact. No obvious neuro-muscular anomalies  detected. Sensory (Neurological): Unimpaired Muscle strength & Tone: No palpable anomalies  Lumbar Spine Area Exam  Skin & Axial Inspection: No masses, redness, or swelling Alignment: Symmetrical Functional ROM: Unrestricted ROM       Stability: No instability detected Muscle Tone/Strength: Functionally intact. No obvious neuro-muscular anomalies detected. Sensory (Neurological): Unimpaired Palpation: No palpable anomalies       Provocative Tests: Hyperextension/rotation test: deferred today       Lumbar quadrant test (Kemp's test): deferred today       Lateral bending test: deferred today       Patrick's Maneuver: (+) for bilateral S-I arthralgia             FABER* test: (+) for bilateral S-I arthralgia             S-I anterior distraction/compression test: deferred today         S-I lateral compression test: deferred today         S-I Thigh-thrust test: deferred today         S-I Gaenslen's test: deferred today         *(Flexion, ABduction and External Rotation) Greater trochanteric bursa tender to palpation bilaterally. Gait & Posture Assessment  Ambulation: Unassisted Gait: Relatively normal for age and body habitus Posture: WNL   Lower Extremity Exam    Side: Right lower extremity in boot  Side: Left lower extremity  Stability: No instability observed          Stability: No instability observed          Skin & Extremity Inspection: In boot with limited range of motion.  Skin & Extremity Inspection: Skin color, temperature, and hair growth are WNL. No peripheral edema or cyanosis. No masses, redness, swelling, asymmetry, or associated skin lesions. No contractures.  Functional ROM: Decreased ROM                  Functional ROM: Unrestricted ROM                  Muscle Tone/Strength: Functionally intact. No obvious neuro-muscular anomalies detected.  Muscle Tone/Strength: Functionally intact. No obvious neuro-muscular anomalies detected.  Sensory (Neurological):  Musculoskeletal pain pattern        Sensory (Neurological): Unimpaired        DTR: Patellar: deferred today Achilles: deferred today Plantar: deferred today  DTR: Patellar: deferred today Achilles: deferred today Plantar: deferred today  Palpation: No palpable anomalies  Palpation: No palpable anomalies   Assessment  Primary Diagnosis & Pertinent Problem List: The primary encounter diagnosis was SI joint arthritis. Diagnoses of Trochanteric bursitis of left hip, Chronic left SI joint pain, and Chronic pain syndrome were also pertinent to this visit.  Status Diagnosis  Responding Having a Flare-up Responding 1. SI joint arthritis   2. Trochanteric bursitis of left hip  3. Chronic left SI joint pain   4. Chronic pain syndrome      52 year old female with a history of bilateral SI joint arthritis who follows up status post bilateral SI joint injections on 06/23/2018.  Patient states that the injections were beneficial in regards to her hip and buttock pain.  She is now endorsing lateral hip pain overlying her greater trochanteric bursa.  This is tender to palpation.  We discussed repeating bilateral SI joint injections along with bilateral greater trochanteric bursa injections.  Risks benefits were discussed and patient like to proceed.  Also repeat annual urine toxicology screen.  [place] PMP checked and appropriate.  We will refill patient's oxycodone as below.  Plan: -Urine tox screen for medication compliance and monitoring -Prescription for oxycodone 5 mg daily as needed severe pain.  Quantity 90 to last for 3 months. -Repeat bilateral SI joint injection -Repeat bilateral greater trochanteric bursa injection  Plan of Care  Lab-work, procedure(s), and/or referral(s): Orders Placed This Encounter  Procedures  . SACROILIAC JOINT INJECTION  . Large Joint Injection/Arthrocentesis  . ToxASSURE Select 13 (MW), Urine   Requested Prescriptions   Signed Prescriptions Disp  Refills  . oxyCODONE (OXY IR/ROXICODONE) 5 MG immediate release tablet 90 tablet 0    Sig: Take 1 tablet (5 mg total) by mouth daily as needed for severe pain. Rx to last for 90 days.     Time Note: Greater than 50% of the 25 minute(s) of face-to-face time spent with Ms. Boulden, was spent in counseling/coordination of care regarding: Ms. Buechele primary cause of pain, the treatment plan, treatment alternatives, the risks and possible complications of proposed treatment, going over the informed consent, the opioid analgesic risks and possible complications, the results, interpretation and significance of  her recent diagnostic interventional treatment(s), the appropriate use of her medications, realistic expectations, the goals of pain management (increased in functionality), the medication agreement and the patient's responsibilities when it comes to controlled substances.   Future Appointments  Date Time Provider [place]  08/05/2018 10:00 AM Archie Balboa, LCAS-A [facility] None  08/11/2018 10:00 AM [name], MD [facility] None  10/30/2018 10:00 AM [name], MD [facility] None  02/24/2019  8:00 AM Ward Givens, NP [facility] None    Primary Care Physician: [name], MD Location: [facility] Out[facility] Management Facility Note by: [name], M.D Date: 08/04/2018; Time: 3:32 PM  Patient Instructions  1. Annual UDS 2. Repeat SI j and GTB injection 3. Refill of Oxycodone for 3 months, e prescribe

## 2018-08-04 NOTE — Progress Notes (Signed)
Safety precautions to be maintained throughout the outpatient stay will include: orient to surroundings, keep bed in low position, maintain call bell within reach at all times, provide assistance with transfer out of bed and ambulation.  

## 2018-08-05 ENCOUNTER — Ambulatory Visit (INDEPENDENT_AMBULATORY_CARE_PROVIDER_SITE_OTHER): Payer: BC Managed Care – PPO | Admitting: Licensed Clinical Social Worker

## 2018-08-05 DIAGNOSIS — F4312 Post-traumatic stress disorder, chronic: Secondary | ICD-10-CM

## 2018-08-06 ENCOUNTER — Other Ambulatory Visit: Payer: Self-pay | Admitting: Obstetrics and Gynecology

## 2018-08-08 ENCOUNTER — Encounter (HOSPITAL_COMMUNITY): Payer: Self-pay | Admitting: Licensed Clinical Social Worker

## 2018-08-08 NOTE — Progress Notes (Signed)
   THERAPIST PROGRESS NOTE  Session Time: 9-10  Participation Level: Active  Behavioral Response: Well GroomedAlertEuthymic  Type of Therapy: Individual Therapy  Treatment Goals addressed: Coping  Interventions: CBT and Supportive  Summary: [name] [name] is a 52 y.o. female who presents with PTSD sxs related to a TBI suffered from a ride at [employer] in 2015.  Pt is engaged, makes intermittent eye contact, and has stable mood throughout session. Pt reports on her progress and ability to enact mindfulness skills to help increase her tolerance of pain and discomfort as well as distressing emotions. Pt updates counselor on Haematologist. Pt is hopeful and smiles often in session. She reports on recent sleep study she had. Pt continues to have mild seizure activity multiple times throughout night according to her husband. Pt is working on capturing this on video as requested by sleep doctors.   Suicidal/Homicidal: Nowithout intent/plan  Therapist Response: Counselor used open questions, active listening and supportive encouragement. Counselor congratulated pt on her effective use of mindfulness skills to alleviate discomfort and frustration.   Plan: Return again in 8 weeks.  Diagnosis:    ICD-10-CM   1. Chronic post-traumatic stress disorder (PTSD) F43.12       Archie Balboa, LCAS-A 08/08/2018

## 2018-08-09 LAB — TOXASSURE SELECT 13 (MW), URINE

## 2018-08-11 ENCOUNTER — Ambulatory Visit (HOSPITAL_BASED_OUTPATIENT_CLINIC_OR_DEPARTMENT_OTHER): Payer: BC Managed Care – PPO | Admitting: Student in an Organized Health Care Education/Training Program

## 2018-08-11 ENCOUNTER — Encounter: Payer: Self-pay | Admitting: Student in an Organized Health Care Education/Training Program

## 2018-08-11 ENCOUNTER — Ambulatory Visit
Admission: RE | Admit: 2018-08-11 | Discharge: 2018-08-11 | Disposition: A | Discharge status: Home / Self Care | Payer: BC Managed Care – PPO | Source: Ambulatory Visit | Attending: Student in an Organized Health Care Education/Training Program | Admitting: Student in an Organized Health Care Education/Training Program

## 2018-08-11 ENCOUNTER — Other Ambulatory Visit: Payer: Self-pay

## 2018-08-11 VITALS — BP 117/59 | HR 58 | Temp 98.3°F | Resp 14 | Ht 62.5 in | Wt 272.0 lb

## 2018-08-11 DIAGNOSIS — M47818 Spondylosis without myelopathy or radiculopathy, sacral and sacrococcygeal region: Secondary | ICD-10-CM

## 2018-08-11 DIAGNOSIS — M7062 Trochanteric bursitis, left hip: Secondary | ICD-10-CM | POA: Diagnosis not present

## 2018-08-11 MED ORDER — ROPIVACAINE HCL 2 MG/ML IJ SOLN
10.0000 mL | Freq: Once | INTRAMUSCULAR | Status: AC
  Administered 2018-08-11: 10 mL
  Filled 2018-08-11: qty 10

## 2018-08-11 MED ORDER — DEXAMETHASONE SODIUM PHOSPHATE 10 MG/ML IJ SOLN
10.0000 mg | Freq: Once | INTRAMUSCULAR | Status: AC
  Administered 2018-08-11: 10 mg
  Filled 2018-08-11: qty 1

## 2018-08-11 MED ORDER — LIDOCAINE HCL 2 % IJ SOLN
20.0000 mL | Freq: Once | INTRAMUSCULAR | Status: AC
  Administered 2018-08-11: 400 mg
  Filled 2018-08-11: qty 20

## 2018-08-11 MED ORDER — FENTANYL CITRATE (PF) 100 MCG/2ML IJ SOLN
25.0000 ug | INTRAMUSCULAR | Status: DC | PRN
  Administered 2018-08-11: 100 ug via INTRAVENOUS
  Filled 2018-08-11: qty 2

## 2018-08-11 MED ORDER — LACTATED RINGERS IV SOLN
1000.0000 mL | Freq: Once | INTRAVENOUS | Status: AC
  Administered 2018-08-11: 1000 mL via INTRAVENOUS

## 2018-08-11 MED ORDER — ROPIVACAINE HCL 2 MG/ML IJ SOLN
1.0000 mL | Freq: Once | INTRAMUSCULAR | Status: AC
  Administered 2018-08-11: 10 mL via EPIDURAL
  Filled 2018-08-11: qty 10

## 2018-08-11 NOTE — Progress Notes (Signed)
Safety precautions to be maintained throughout the outpatient stay will include: orient to surroundings, keep bed in low position, maintain call bell within reach at all times, provide assistance with transfer out of bed and ambulation.  

## 2018-08-11 NOTE — Progress Notes (Signed)
Patient's Name: [name] [name]  MRN: [id number]  Referring Provider: [name], MD  DOB: 04/03/1966  PCP: [name], MD  DOS: 08/11/2018  Note by: [name], MD  Service setting: Ambulatory outpatient  Specialty: Interventional Pain Management  Patient type: Established  Location: [facility] (AMB) Pain Management Facility  Visit type: Interventional Procedure   Primary Reason for Visit: Interventional Pain Management Treatment. CC: Back Pain (lower)  Procedure #2:  Anesthesia, Analgesia, Anxiolysis:  Type: Gluteofemoral Bursa Injection #3  Primary Purpose: Diagnostic/Therapeutric Region: Upper (proximal) Femoral Region Level: Hip Joint Target Area: Superior aspect of the hip joint cavity, going thru the superior portion of the capsular ligament. Approach: Posterolateral approach Laterality: Bilateral Position: Prone  Type: Local Anesthesia and IV Sedation Indication(s): Analgesia         Local Anesthetic: Lidocaine 2% Route: Infiltration ([place]/IM) IV Access: Secured Sedation: Meaningful verbal contact was maintained at all times during the procedure    Indications: 1. SI joint arthritis   2. Trochanteric bursitis of left hip    Pain Score: Pre-procedure: 9 /10 Post-procedure: 0-No pain/10  Pre-op Assessment:  [name] [name] is a 52 y.o. (year old), female patient, seen today for interventional treatment. She  has a past surgical history that includes Cesarean section (1989); Esophagogastroduodenoscopy (N/A, 11/21/2012); Colonoscopy (N/A, 11/21/2012); Incisional hernia repair (N/A, 12/17/2012); Insertion of mesh (N/A, 12/17/2012); Cardiac catheterization (N/A, 01/21/2015); Appendectomy (1990s); Hernia repair; Dilation and curettage of uterus (1985); Ovarian cyst removal (2007; 2014); Abdominal hysterectomy (2005); Colonoscopy with propofol (N/A, 09/02/2017); and Esophagogastroduodenoscopy (egd) with propofol (N/A, 09/02/2017). [name] [name] has a current medication list which includes the  following prescription(s): ajovy, albuterol, albuterol, cetirizine, clonazepam, dicyclomine, estradiol, fluoxetine, gabapentin, lamotrigine, omeprazole, oxycodone, ranitidine, sumatriptan, tizanidine, and vitamin d (ergocalciferol), and the following Facility-Administered Medications: fentanyl. Her primarily concern today is the Back Pain (lower)  Initial Vital Signs:  Pulse/HCG Rate: (!) 58ECG Heart Rate: 68 Temp: 98.5 F (36.9 C) Resp: 16 BP: 122/74 SpO2: 99 %  BMI: Estimated body mass index is 48.96 kg/m as calculated from the following:   Height as of this encounter: 5' 2.5" (1.588 m).   Weight as of this encounter: 272 lb (123.4 kg).  Risk Assessment: Allergies: Reviewed. She is allergic to amoxicillin; penicillins; biaxin [clarithromycin]; erythromycin base; and zithromax [azithromycin].  Allergy Precautions: None required Coagulopathies: Reviewed. None identified.  Blood-thinner therapy: None at this time Active Infection(s): Reviewed. None identified. [name] [name] is afebrile  Site Confirmation: [name] [name] was asked to confirm the procedure and laterality before marking the site Procedure checklist: Completed Consent: Before the procedure and under the influence of no sedative(s), amnesic(s), or anxiolytics, the patient was informed of the treatment options, risks and possible complications. To fulfill our ethical and legal obligations, as recommended by the American Medical Association's Code of Ethics, I have informed the patient of my clinical impression; the nature and purpose of the treatment or procedure; the risks, benefits, and possible complications of the intervention; the alternatives, including doing nothing; the risk(s) and benefit(s) of the alternative treatment(s) or procedure(s); and the risk(s) and benefit(s) of doing nothing. The patient was provided information about the general risks and possible complications associated with the procedure. These may include, but  are not limited to: failure to achieve desired goals, infection, bleeding, organ or nerve damage, allergic reactions, paralysis, and death. In addition, the patient was informed of those risks and complications associated to the procedure, such as failure to decrease pain; infection; bleeding; organ or nerve damage with  subsequent damage to sensory, motor, and/or autonomic systems, resulting in permanent pain, numbness, and/or weakness of one or several areas of the body; allergic reactions; (i.e.: anaphylactic reaction); and/or death. Furthermore, the patient was informed of those risks and complications associated with the medications. These include, but are not limited to: allergic reactions (i.e.: anaphylactic or anaphylactoid reaction(s)); adrenal axis suppression; blood sugar elevation that in diabetics may result in ketoacidosis or comma; water retention that in patients with history of congestive heart failure may result in shortness of breath, pulmonary edema, and decompensation with resultant heart failure; weight gain; swelling or edema; medication-induced neural toxicity; particulate matter embolism and blood vessel occlusion with resultant organ, and/or nervous system infarction; and/or aseptic necrosis of one or more joints. Finally, the patient was informed that Medicine is not an exact science; therefore, there is also the possibility of unforeseen or unpredictable risks and/or possible complications that may result in a catastrophic outcome. The patient indicated having understood very clearly. We have given the patient no guarantees and we have made no promises. Enough time was given to the patient to ask questions, all of which were answered to the patient's satisfaction. [name] [name] has indicated that she wanted to continue with the procedure. Attestation: I, the ordering provider, attest that I have discussed with the patient the benefits, risks, side-effects, alternatives, likelihood of  achieving goals, and potential problems during recovery for the procedure that I have provided informed consent. Date  Time: 08/11/2018  9:53 AM  Pre-Procedure Preparation:  Monitoring: As per clinic protocol. Respiration, ETCO2, SpO2, BP, heart rate and rhythm monitor placed and checked for adequate function Safety Precautions: Patient was assessed for positional comfort and pressure points before starting the procedure. Time-out: I initiated and conducted the "Time-out" before starting the procedure, as per protocol. The patient was asked to participate by confirming the accuracy of the "Time Out" information. Verification of the correct person, site, and procedure were performed and confirmed by me, the nursing staff, and the patient. "Time-out" conducted as per Joint Commission's Universal Protocol (UP.01.01.01). Time: 1059  Description of Procedure:          Area Prepped: Entire Posterolateral hip area. Prepping solution: ChloraPrep (2% chlorhexidine gluconate and 70% isopropyl alcohol) Safety Precautions: Aspiration looking for blood return was conducted prior to all injections. At no point did we inject any substances, as a needle was being advanced. No attempts were made at seeking any paresthesias. Safe injection practices and needle disposal techniques used. Medications properly checked for expiration dates. SDV (single dose vial) medications used. Description of the Procedure: Protocol guidelines were followed. The patient was placed in position over the procedure table. The target area was identified and the area prepped in the usual manner. Skin & deeper tissues infiltrated with local anesthetic. Appropriate amount of time allowed to pass for local anesthetics to take effect. The procedure needles were then advanced to the target area. Proper needle placement secured. Negative aspiration confirmed. Solution injected in intermittent fashion, asking for systemic symptoms every 0.5cc of  injectate. The needles were then removed and the area cleansed, making sure to leave some of the prepping solution back to take advantage of its long term bactericidal properties. Vitals:   08/11/18 1117 08/11/18 1127 08/11/18 1137 08/11/18 1146  BP: 125/74 129/68 125/61 (!) 117/59  Pulse:      Resp: 16 14 16 14   Temp:  98.3 F (36.8 C)    TempSrc:      SpO2: 95% 96% 96%  96%  Weight:      Height:        Start Time: 1059 hrs. End Time: 1116 hrs.  Materials:  Needle(s) Type: Regular needle Gauge: 22G Length: 3.5-in Medication(s): Please see orders for medications and dosing details. 8 cc solution made of 7 cc of 0.2% ropivacaine, 1 cc of Decadron 10 mg/cc.  4 cc injected in and around the left bursa 4 cc injected in and around the right bursa. Imaging Guidance (Non-Spinal):          Type of Imaging Technique: Fluoroscopy Guidance (Non-Spinal) Indication(s): Assistance in needle guidance and placement for procedures requiring needle placement in or near specific anatomical locations not easily accessible without such assistance. Exposure Time: Please see nurses notes. Contrast: None used. Fluoroscopic Guidance: I was personally present during the use of fluoroscopy. "Tunnel Vision Technique" used to obtain the best possible view of the target area. Parallax error corrected before commencing the procedure. "Direction-depth-direction" technique used to introduce the needle under continuous pulsed fluoroscopy. Once target was reached, antero-posterior, oblique, and lateral fluoroscopic projection used confirm needle placement in all planes. Images permanently stored in EMR. Interpretation: No contrast injected.  Antibiotic Prophylaxis:   Anti-infectives (From admission, onward)   None     Indication(s): None identified  Post-operative Assessment:  Post-procedure Vital Signs:  Pulse/HCG Rate: (!) 58(!) 53 Temp: 98.3 F (36.8 C) Resp: 14 BP: (!) 117/59 SpO2: 96 %  EBL:  None  Complications: No immediate post-treatment complications observed by team, or reported by patient.  Note: The patient tolerated the entire procedure well. A repeat set of vitals were taken after the procedure and the patient was kept under observation following institutional policy, for this type of procedure. Post-procedural neurological assessment was performed, showing return to baseline, prior to discharge. The patient was provided with post-procedure discharge instructions, including a section on how to identify potential problems. Should any problems arise concerning this procedure, the patient was given instructions to immediately contact us, at any time, without hesitation. In any case, we plan to contact the patient by telephone for a follow-up status report regarding this interventional procedure.  Comments:  No additional relevant information. 5 out of 5 strength bilateral lower extremity: Plantar flexion, dorsiflexion, knee flexion, knee extension.  Plan of Care    Imaging Orders     DG C-Arm 1-60 Min-No Report Procedure Orders    No procedure(s) ordered today    Medications ordered for procedure: Meds ordered this encounter  Medications  . fentaNYL (SUBLIMAZE) injection 25-100 mcg    Make sure Narcan is available in the pyxis when using this medication. In the event of respiratory depression (RR< 8/min): Titrate NARCAN (naloxone) in increments of 0.1 to 0.2 mg IV at 2-3 minute intervals, until desired degree of reversal.  . lactated ringers infusion 1,000 mL  . ropivacaine (PF) 2 mg/mL (0.2%) (NAROPIN) injection 10 mL  . lidocaine (XYLOCAINE) 2 % (with pres) injection 400 mg  . dexamethasone (DECADRON) injection 10 mg  . dexamethasone (DECADRON) injection 10 mg  . ropivacaine (PF) 2 mg/mL (0.2%) (NAROPIN) injection 1 mL   Medications administered: We administered fentaNYL, lactated ringers, ropivacaine (PF) 2 mg/mL (0.2%), lidocaine, dexamethasone, dexamethasone,  and ropivacaine (PF) 2 mg/mL (0.2%).  See the medical record for exact dosing, route, and time of administration.  New Prescriptions   No medications on file   Disposition: Discharge home  Discharge Date & Time: 08/11/2018; 1147 hrs.    Future Appointments  Date Time Provider  [place]  09/10/2018 10:00 AM Archie Balboa, LCAS-A [facility] None  10/02/2018 10:00 AM Archie Balboa, LCAS-A [facility] None  10/30/2018 10:00 AM [name], MD [facility] None  02/24/2019  8:00 AM Ward Givens, NP [facility] None   Primary Care Physician: [name], MD Location: [facility] Note by: [name], MD Date: 08/11/2018; Time: 1:40 PM  Disclaimer:  Medicine is not an exact science. The only guarantee in medicine is that nothing is guaranteed. It is important to note that the decision to proceed with this intervention was based on the information collected from the patient. The Data and conclusions were drawn from the patient's questionnaire, the interview, and the physical examination. Because the information was provided in large part by the patient, it cannot be guaranteed that it has not been purposely or unconsciously manipulated. Every effort has been made to obtain as much relevant data as possible for this evaluation. It is important to note that the conclusions that lead to this procedure are derived in large part from the available data. Always take into account that the treatment will also be dependent on availability of resources and existing treatment guidelines, considered by other Pain Management Practitioners as being common knowledge and practice, at the time of the intervention. For Medico-Legal purposes, it is also important to point out that variation in procedural techniques and pharmacological choices are the acceptable norm. The indications, contraindications, technique, and results of the above procedure should only be interpreted and judged by a  Board-Certified Interventional Pain Specialist with extensive familiarity and expertise in the same exact procedure and technique.

## 2018-08-11 NOTE — Progress Notes (Signed)
Patient's Name: [name] [name]  MRN: [id number]  Referring Provider: [name], MD  DOB: 01-Nov-1965  PCP: [name], MD  DOS: 08/11/2018  Note by: [name], MD  Service setting: Ambulatory outpatient  Specialty: Interventional Pain Management  Patient type: Established  Location: [facility] (AMB) Pain Management Facility  Visit type: Interventional Procedure   Primary Reason for Visit: Interventional Pain Management Treatment. CC: Back Pain (lower)  Procedure:          Anesthesia, Analgesia, Anxiolysis:  Type: Therapeutic Sacroiliac Joint Steroid Injection          Region: Inferior Lumbosacral Region Level: PIIS (Posterior Inferior Iliac Spine) Laterality: Bilateral  Type: Moderate (Conscious) Sedation combined with Local Anesthesia Indication(s): Analgesia and Anxiety Route: Intravenous (IV) IV Access: Secured Sedation: Meaningful verbal contact was maintained at all times during the procedure  Local Anesthetic: Lidocaine 1-2%  Position: Prone           Indications: 1. SI joint arthritis   2. Trochanteric bursitis of left hip    Pain Score: Pre-procedure: 9 /10 Post-procedure: 0-No pain/10  Pre-op Assessment:  Ms. Hoque is a 52 y.o. (year old), female patient, seen today for interventional treatment. She  has a past surgical history that includes Cesarean section (1989); Esophagogastroduodenoscopy (N/A, 11/21/2012); Colonoscopy (N/A, 11/21/2012); Incisional hernia repair (N/A, 12/17/2012); Insertion of mesh (N/A, 12/17/2012); Cardiac catheterization (N/A, 01/21/2015); Appendectomy (1990s); Hernia repair; Dilation and curettage of uterus (1985); Ovarian cyst removal (2007; 2014); Abdominal hysterectomy (2005); Colonoscopy with propofol (N/A, 09/02/2017); and Esophagogastroduodenoscopy (egd) with propofol (N/A, 09/02/2017). Ms. Sires has a current medication list which includes the following prescription(s): ajovy, albuterol, albuterol, cetirizine, clonazepam, dicyclomine, estradiol,  fluoxetine, gabapentin, lamotrigine, omeprazole, oxycodone, ranitidine, sumatriptan, tizanidine, and vitamin d (ergocalciferol), and the following Facility-Administered Medications: fentanyl. Her primarily concern today is the Back Pain (lower)  Initial Vital Signs:  Pulse/HCG Rate: (!) 58ECG Heart Rate: 68 Temp: 98.5 F (36.9 C) Resp: 16 BP: 122/74 SpO2: 99 %  BMI: Estimated body mass index is 48.96 kg/m as calculated from the following:   Height as of this encounter: 5' 2.5" (1.588 m).   Weight as of this encounter: 272 lb (123.4 kg).  Risk Assessment: Allergies: Reviewed. She is allergic to amoxicillin; penicillins; biaxin [clarithromycin]; erythromycin base; and zithromax [azithromycin].  Allergy Precautions: None required Coagulopathies: Reviewed. None identified.  Blood-thinner therapy: None at this time Active Infection(s): Reviewed. None identified. Ms. Mabee is afebrile  Site Confirmation: Ms. Pereda was asked to confirm the procedure and laterality before marking the site Procedure checklist: Completed Consent: Before the procedure and under the influence of no sedative(s), amnesic(s), or anxiolytics, the patient was informed of the treatment options, risks and possible complications. To fulfill our ethical and legal obligations, as recommended by the American Medical Association's Code of Ethics, I have informed the patient of my clinical impression; the nature and purpose of the treatment or procedure; the risks, benefits, and possible complications of the intervention; the alternatives, including doing nothing; the risk(s) and benefit(s) of the alternative treatment(s) or procedure(s); and the risk(s) and benefit(s) of doing nothing. The patient was provided information about the general risks and possible complications associated with the procedure. These may include, but are not limited to: failure to achieve desired goals, infection, bleeding, organ or nerve damage, allergic  reactions, paralysis, and death. In addition, the patient was informed of those risks and complications associated to the procedure, such as failure to decrease pain; infection; bleeding; organ or nerve damage with subsequent  damage to sensory, motor, and/or autonomic systems, resulting in permanent pain, numbness, and/or weakness of one or several areas of the body; allergic reactions; (i.e.: anaphylactic reaction); and/or death. Furthermore, the patient was informed of those risks and complications associated with the medications. These include, but are not limited to: allergic reactions (i.e.: anaphylactic or anaphylactoid reaction(s)); adrenal axis suppression; blood sugar elevation that in diabetics may result in ketoacidosis or comma; water retention that in patients with history of congestive heart failure may result in shortness of breath, pulmonary edema, and decompensation with resultant heart failure; weight gain; swelling or edema; medication-induced neural toxicity; particulate matter embolism and blood vessel occlusion with resultant organ, and/or nervous system infarction; and/or aseptic necrosis of one or more joints. Finally, the patient was informed that Medicine is not an exact science; therefore, there is also the possibility of unforeseen or unpredictable risks and/or possible complications that may result in a catastrophic outcome. The patient indicated having understood very clearly. We have given the patient no guarantees and we have made no promises. Enough time was given to the patient to ask questions, all of which were answered to the patient's satisfaction. Ms. Mapp has indicated that she wanted to continue with the procedure. Attestation: I, the ordering provider, attest that I have discussed with the patient the benefits, risks, side-effects, alternatives, likelihood of achieving goals, and potential problems during recovery for the procedure that I have provided informed  consent. Date  Time: 08/11/2018  9:53 AM  Pre-Procedure Preparation:  Monitoring: As per clinic protocol. Respiration, ETCO2, SpO2, BP, heart rate and rhythm monitor placed and checked for adequate function Safety Precautions: Patient was assessed for positional comfort and pressure points before starting the procedure. Time-out: I initiated and conducted the "Time-out" before starting the procedure, as per protocol. The patient was asked to participate by confirming the accuracy of the "Time Out" information. Verification of the correct person, site, and procedure were performed and confirmed by me, the nursing staff, and the patient. "Time-out" conducted as per Joint Commission's Universal Protocol (UP.01.01.01). Time: 1059  Description of Procedure:          Target Area: Inferior, posterior, aspect of the sacroiliac fissure Approach: Posterior, paraspinal, ipsilateral approach. Area Prepped: Entire Lower Lumbosacral Region Prepping solution: ChloraPrep (2% chlorhexidine gluconate and 70% isopropyl alcohol) Safety Precautions: Aspiration looking for blood return was conducted prior to all injections. At no point did we inject any substances, as a needle was being advanced. No attempts were made at seeking any paresthesias. Safe injection practices and needle disposal techniques used. Medications properly checked for expiration dates. SDV (single dose vial) medications used. Description of the Procedure: Protocol guidelines were followed. The patient was placed in position over the procedure table. The target area was identified and the area prepped in the usual manner. Skin & deeper tissues infiltrated with local anesthetic. Appropriate amount of time allowed to pass for local anesthetics to take effect. The procedure needle was advanced under fluoroscopic guidance into the sacroiliac joint until a firm endpoint was obtained. Proper needle placement secured. Negative aspiration confirmed. Solution  injected in intermittent fashion, asking for systemic symptoms every 0.5cc of injectate. The needles were then removed and the area cleansed, making sure to leave some of the prepping solution back to take advantage of its long term bactericidal properties. Vitals:   08/11/18 1117 08/11/18 1127 08/11/18 1137 08/11/18 1146  BP: 125/74 129/68 125/61 (!) 117/59  Pulse:      Resp: 16 14 16  14  Temp:  98.3 F (36.8 C)    TempSrc:      SpO2: 95% 96% 96% 96%  Weight:      Height:        Start Time: 1059 hrs. End Time: 1116 hrs. Materials:  Needle(s) Type: Spinal Needle Gauge: 22G Length: 3.5-in Medication(s): Please see orders for medications and dosing details. 5 cc solution made of 4 cc of 0.2% ropivacaine, 1 cc of Decadron 10 mg/cc.  2.5 cc injected LEFT intra-articular, 2.5 cc injected periarticular left SI joint 5 cc solution made of 4 cc of 0.2% ropivacaine, 1 cc of Decadron 10 mg/cc.  2.5 cc injected RIGHT intra-articular, 2.5 cc injected periarticular RIGHT SI joint Imaging Guidance (Non-Spinal):          Type of Imaging Technique: Fluoroscopy Guidance (Non-Spinal) Indication(s): Assistance in needle guidance and placement for procedures requiring needle placement in or near specific anatomical locations not easily accessible without such assistance. Exposure Time: Please see nurses notes. Contrast: None used. Fluoroscopic Guidance: I was personally present during the use of fluoroscopy. "Tunnel Vision Technique" used to obtain the best possible view of the target area. Parallax error corrected before commencing the procedure. "Direction-depth-direction" technique used to introduce the needle under continuous pulsed fluoroscopy. Once target was reached, antero-posterior, oblique, and lateral fluoroscopic projection used confirm needle placement in all planes. Images permanently stored in EMR. Interpretation: No contrast injected.  Antibiotic Prophylaxis:   Anti-infectives (From  admission, onward)   None     Indication(s): None identified  Post-operative Assessment:  Post-procedure Vital Signs:  Pulse/HCG Rate: (!) 58(!) 53 Temp: 98.3 F (36.8 C) Resp: 14 BP: (!) 117/59 SpO2: 96 %  EBL: None  Complications: No immediate post-treatment complications observed by team, or reported by patient.  Note: The patient tolerated the entire procedure well. A repeat set of vitals were taken after the procedure and the patient was kept under observation following institutional policy, for this type of procedure. Post-procedural neurological assessment was performed, showing return to baseline, prior to discharge. The patient was provided with post-procedure discharge instructions, including a section on how to identify potential problems. Should any problems arise concerning this procedure, the patient was given instructions to immediately contact us, at any time, without hesitation. In any case, we plan to contact the patient by telephone for a follow-up status report regarding this interventional procedure.  Comments:  No additional relevant information.  Plan of Care   Imaging Orders     DG C-Arm 1-60 Min-No Report Procedure Orders    No procedure(s) ordered today   Also refill Oxycodone, PMP checked and appropriate.  Medications ordered for procedure: Meds ordered this encounter  Medications  . fentaNYL (SUBLIMAZE) injection 25-100 mcg    Make sure Narcan is available in the pyxis when using this medication. In the event of respiratory depression (RR< 8/min): Titrate NARCAN (naloxone) in increments of 0.1 to 0.2 mg IV at 2-3 minute intervals, until desired degree of reversal.  . lactated ringers infusion 1,000 mL  . ropivacaine (PF) 2 mg/mL (0.2%) (NAROPIN) injection 10 mL  . lidocaine (XYLOCAINE) 2 % (with pres) injection 400 mg  . dexamethasone (DECADRON) injection 10 mg  . dexamethasone (DECADRON) injection 10 mg  . ropivacaine (PF) 2 mg/mL (0.2%)  (NAROPIN) injection 1 mL   Medications administered: We administered fentaNYL, lactated ringers, ropivacaine (PF) 2 mg/mL (0.2%), lidocaine, dexamethasone, dexamethasone, and ropivacaine (PF) 2 mg/mL (0.2%).  See the medical record for exact dosing, route, and  time of administration.  Disposition: Discharge home  Discharge Date & Time: 08/11/2018; 1147 hrs.   Physician-requested Follow-up: No follow-ups on file.  Future Appointments  Date Time Provider [place]  09/10/2018 10:00 AM Archie Balboa, LCAS-A [facility] None  10/02/2018 10:00 AM Archie Balboa, LCAS-A [facility] None  10/30/2018 10:00 AM [name], MD [facility] None  02/24/2019  8:00 AM Ward Givens, NP [facility] None   Primary Care Physician: [name], MD Location: [facility] Note by: [name], MD Date: 08/11/2018; Time: 12:30 PM  Disclaimer:  Medicine is not an exact science. The only guarantee in medicine is that nothing is guaranteed. It is important to note that the decision to proceed with this intervention was based on the information collected from the patient. The Data and conclusions were drawn from the patient's questionnaire, the interview, and the physical examination. Because the information was provided in large part by the patient, it cannot be guaranteed that it has not been purposely or unconsciously manipulated. Every effort has been made to obtain as much relevant data as possible for this evaluation. It is important to note that the conclusions that lead to this procedure are derived in large part from the available data. Always take into account that the treatment will also be dependent on availability of resources and existing treatment guidelines, considered by other Pain Management Practitioners as being common knowledge and practice, at the time of the intervention. For Medico-Legal purposes, it is also important to point out that variation in procedural  techniques and pharmacological choices are the acceptable norm. The indications, contraindications, technique, and results of the above procedure should only be interpreted and judged by a Board-Certified Interventional Pain Specialist with extensive familiarity and expertise in the same exact procedure and technique.

## 2018-08-12 ENCOUNTER — Telehealth: Payer: Self-pay

## 2018-08-12 NOTE — Telephone Encounter (Signed)
Post procedure phone call.  Call cant be completed for some reason.

## 2018-08-13 ENCOUNTER — Ambulatory Visit: Payer: Self-pay | Admitting: Student in an Organized Health Care Education/Training Program

## 2018-08-26 ENCOUNTER — Telehealth: Payer: Self-pay | Admitting: Student in an Organized Health Care Education/Training Program

## 2018-08-26 NOTE — Telephone Encounter (Signed)
Dr. Holley Raring,                Pharmacy called and wanted to question the Zanaflex  patient is trying to fill. She has been prescribed a few benzo prescriptions since your original Zanaflex script in August. The last benzo refill was 08-13-2018 from another physician. I put a hold on filling the Zanaflex until I got your permission. If you OK the refill the pharmacy is requiring more diagnosis codes. So just let me know what you want to do. Thank youAnderson [place] PS Please send response to PM clinical as I will be on vacation until 09-09-2018.

## 2018-08-26 NOTE — Telephone Encounter (Signed)
[facility] called requesting to discuss medication scripts with a Nurse/ transferred call to [facility]

## 2018-09-01 ENCOUNTER — Telehealth: Payer: Self-pay | Admitting: *Deleted

## 2018-09-01 NOTE — Telephone Encounter (Signed)
Began Ajovy PA on CMM, key [id number]. Your information has been submitted to [facility]. To check for an updated outcome later, reopen this PA request from your dashboard.  If [employer] has not responded to your request within 24 hours, contact [place] at [phone].

## 2018-09-01 NOTE — Telephone Encounter (Signed)
No refills of Zanaflex. PMP reveals patient had Klonopin filled, likely for anxiety. Will discuss risks of Zanaflex while on prn Klonopin and prn Oxycodone in context of obesity and likely OSA.

## 2018-09-01 NOTE — Telephone Encounter (Signed)
Pt has two pharm listed  1st one no answer till after 9, called 2nd # and they stated she does not get Prescriptions there. Need to call [phone] to respond to the question on Zanaflex.

## 2018-09-01 NOTE — Telephone Encounter (Signed)
Called and spoke with [place] and instructed not to refill Zanaflex per Dr Holley Raring

## 2018-09-02 ENCOUNTER — Ambulatory Visit: Payer: Self-pay | Admitting: Student in an Organized Health Care Education/Training Program

## 2018-09-02 NOTE — Telephone Encounter (Addendum)
[facility] approved Ajovy from 09/01/18 - 09/02/19. [facility] [address].

## 2018-09-10 ENCOUNTER — Ambulatory Visit (HOSPITAL_COMMUNITY): Payer: BC Managed Care – PPO | Admitting: Licensed Clinical Social Worker

## 2018-09-16 ENCOUNTER — Ambulatory Visit (HOSPITAL_COMMUNITY): Payer: BC Managed Care – PPO | Admitting: Licensed Clinical Social Worker

## 2018-09-20 ENCOUNTER — Emergency Department (HOSPITAL_COMMUNITY): Payer: BC Managed Care – PPO

## 2018-09-20 ENCOUNTER — Encounter (HOSPITAL_COMMUNITY): Payer: Self-pay | Admitting: Emergency Medicine

## 2018-09-20 ENCOUNTER — Emergency Department (HOSPITAL_COMMUNITY)
Admission: EM | Admit: 2018-09-20 | Discharge: 2018-09-20 | Disposition: A | Discharge status: Home / Self Care | Payer: BC Managed Care – PPO | Attending: Emergency Medicine | Admitting: Emergency Medicine

## 2018-09-20 DIAGNOSIS — Z79899 Other long term (current) drug therapy: Secondary | ICD-10-CM | POA: Insufficient documentation

## 2018-09-20 DIAGNOSIS — J45909 Unspecified asthma, uncomplicated: Secondary | ICD-10-CM | POA: Diagnosis not present

## 2018-09-20 DIAGNOSIS — R0789 Other chest pain: Secondary | ICD-10-CM | POA: Diagnosis not present

## 2018-09-20 LAB — BASIC METABOLIC PANEL
Anion gap: 6 (ref 5–15)
BUN: 13 mg/dL (ref 6–20)
CHLORIDE: 113 mmol/L — AB (ref 98–111)
CO2: 19 mmol/L — ABNORMAL LOW (ref 22–32)
Calcium: 9 mg/dL (ref 8.9–10.3)
Creatinine, Ser: 0.93 mg/dL (ref 0.44–1.00)
GFR calc Af Amer: >60 mL/min (ref >60)
GFR calc non Af Amer: >60 mL/min (ref >60)
Glucose, Bld: 118 mg/dL — ABNORMAL HIGH (ref 70–99)
Potassium: 3.9 mmol/L (ref 3.5–5.1)
SODIUM: 138 mmol/L (ref 135–145)

## 2018-09-20 LAB — CBC
HEMATOCRIT: 42.4 % (ref 36.0–46.0)
Hemoglobin: 13.5 g/dL (ref 12.0–15.0)
MCH: 28.2 pg (ref 26.0–34.0)
MCHC: 31.8 g/dL (ref 30.0–36.0)
MCV: 88.5 fL (ref 80.0–100.0)
Platelets: 192 10*3/uL (ref 150–400)
RBC: 4.79 MIL/uL (ref 3.87–5.11)
RDW: 13.7 % (ref 11.5–15.5)
WBC: 6.5 10*3/uL (ref 4.0–10.5)
nRBC: 0 % (ref 0.0–0.2)

## 2018-09-20 LAB — I-STAT BETA HCG BLOOD, ED (MC, WL, AP ONLY): I-stat hCG, quantitative: <5 m[IU]/mL (ref <5)

## 2018-09-20 LAB — D-DIMER, QUANTITATIVE: D-Dimer, Quant: 0.51 ug/mL-FEU — ABNORMAL HIGH (ref 0.00–0.50)

## 2018-09-20 LAB — I-STAT TROPONIN, ED: Troponin i, poc: 0 ng/mL (ref 0.00–0.08)

## 2018-09-20 MED ORDER — LIDOCAINE 5 % EX PTCH
1.0000 | MEDICATED_PATCH | CUTANEOUS | Status: DC
  Administered 2018-09-20: 1 via TRANSDERMAL
  Filled 2018-09-20: qty 1

## 2018-09-20 MED ORDER — LIDOCAINE 5 % EX PTCH: 1.0000 | MEDICATED_PATCH | CUTANEOUS | 0 refills | Status: AC
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
## 2018-09-20 NOTE — ED Notes (Signed)
No E-signature available; pt is stable for discharge and states understanding of discharge instructions  

## 2018-09-24 ENCOUNTER — Ambulatory Visit (HOSPITAL_COMMUNITY): Payer: BC Managed Care – PPO | Admitting: Licensed Clinical Social Worker

## 2018-09-24 ENCOUNTER — Ambulatory Visit (INDEPENDENT_AMBULATORY_CARE_PROVIDER_SITE_OTHER): Payer: BC Managed Care – PPO | Admitting: Licensed Clinical Social Worker

## 2018-09-24 DIAGNOSIS — F4312 Post-traumatic stress disorder, chronic: Secondary | ICD-10-CM | POA: Diagnosis not present

## 2018-09-25 ENCOUNTER — Encounter (HOSPITAL_COMMUNITY): Payer: Self-pay | Admitting: Licensed Clinical Social Worker
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
## 2018-10-02 ENCOUNTER — Ambulatory Visit (HOSPITAL_COMMUNITY): Payer: BC Managed Care – PPO | Admitting: Licensed Clinical Social Worker

## 2018-10-06 ENCOUNTER — Emergency Department (HOSPITAL_COMMUNITY)
Admission: EM | Admit: 2018-10-06 | Discharge: 2018-10-06 | Disposition: A | Discharge status: Home / Self Care | Payer: BC Managed Care – PPO | Attending: Emergency Medicine | Admitting: Emergency Medicine

## 2018-10-06 ENCOUNTER — Ambulatory Visit (HOSPITAL_COMMUNITY): Payer: BC Managed Care – PPO | Admitting: Licensed Clinical Social Worker

## 2018-10-06 ENCOUNTER — Encounter (HOSPITAL_COMMUNITY): Payer: Self-pay | Admitting: *Deleted

## 2018-10-06 DIAGNOSIS — Z5321 Procedure and treatment not carried out due to patient leaving prior to being seen by health care provider: Secondary | ICD-10-CM | POA: Insufficient documentation

## 2018-10-06 DIAGNOSIS — H5789 Other specified disorders of eye and adnexa: Secondary | ICD-10-CM | POA: Insufficient documentation

## 2018-10-06 NOTE — ED Triage Notes (Signed)
Pt sent by PCP here for further eval of swelling and redness acute onset today to R eye, pt reports green drainage and swelling to the area, pt A&O x4

## 2018-10-06 NOTE — ED Notes (Signed)
Patient LWBS

## 2018-10-07 ENCOUNTER — Other Ambulatory Visit: Payer: Self-pay

## 2018-10-07 ENCOUNTER — Inpatient Hospital Stay (HOSPITAL_BASED_OUTPATIENT_CLINIC_OR_DEPARTMENT_OTHER)
Admission: EM | Admit: 2018-10-07 | Discharge: 2018-10-10 | DRG: 115 | Disposition: A | Discharge status: Home / Self Care | Payer: BC Managed Care – PPO | Attending: Internal Medicine | Admitting: Internal Medicine

## 2018-10-07 ENCOUNTER — Ambulatory Visit (HOSPITAL_COMMUNITY): Payer: BC Managed Care – PPO | Admitting: Licensed Clinical Social Worker

## 2018-10-07 ENCOUNTER — Encounter (HOSPITAL_BASED_OUTPATIENT_CLINIC_OR_DEPARTMENT_OTHER): Payer: Self-pay | Admitting: Radiology

## 2018-10-07 ENCOUNTER — Emergency Department (HOSPITAL_BASED_OUTPATIENT_CLINIC_OR_DEPARTMENT_OTHER): Payer: BC Managed Care – PPO

## 2018-10-07 DIAGNOSIS — M47816 Spondylosis without myelopathy or radiculopathy, lumbar region: Secondary | ICD-10-CM | POA: Diagnosis present

## 2018-10-07 DIAGNOSIS — K219 Gastro-esophageal reflux disease without esophagitis: Secondary | ICD-10-CM | POA: Diagnosis not present

## 2018-10-07 DIAGNOSIS — R402252 Coma scale, best verbal response, oriented, at arrival to emergency department: Secondary | ICD-10-CM | POA: Diagnosis present

## 2018-10-07 DIAGNOSIS — S069X9A Unspecified intracranial injury with loss of consciousness of unspecified duration, initial encounter: Secondary | ICD-10-CM | POA: Diagnosis present

## 2018-10-07 DIAGNOSIS — R451 Restlessness and agitation: Secondary | ICD-10-CM | POA: Diagnosis present

## 2018-10-07 DIAGNOSIS — Z7951 Long term (current) use of inhaled steroids: Secondary | ICD-10-CM | POA: Diagnosis not present

## 2018-10-07 DIAGNOSIS — Z79890 Hormone replacement therapy (postmenopausal): Secondary | ICD-10-CM

## 2018-10-07 DIAGNOSIS — Z880 Allergy status to penicillin: Secondary | ICD-10-CM

## 2018-10-07 DIAGNOSIS — M546 Pain in thoracic spine: Secondary | ICD-10-CM | POA: Diagnosis present

## 2018-10-07 DIAGNOSIS — J4520 Mild intermittent asthma, uncomplicated: Secondary | ICD-10-CM | POA: Diagnosis not present

## 2018-10-07 DIAGNOSIS — S069XAA Unspecified intracranial injury with loss of consciousness status unknown, initial encounter: Secondary | ICD-10-CM | POA: Diagnosis present

## 2018-10-07 DIAGNOSIS — R402142 Coma scale, eyes open, spontaneous, at arrival to emergency department: Secondary | ICD-10-CM | POA: Diagnosis present

## 2018-10-07 DIAGNOSIS — I4891 Unspecified atrial fibrillation: Secondary | ICD-10-CM | POA: Diagnosis present

## 2018-10-07 DIAGNOSIS — F419 Anxiety disorder, unspecified: Secondary | ICD-10-CM | POA: Diagnosis not present

## 2018-10-07 DIAGNOSIS — Z791 Long term (current) use of non-steroidal anti-inflammatories (NSAID): Secondary | ICD-10-CM | POA: Diagnosis not present

## 2018-10-07 DIAGNOSIS — Z825 Family history of asthma and other chronic lower respiratory diseases: Secondary | ICD-10-CM

## 2018-10-07 DIAGNOSIS — Z881 Allergy status to other antibiotic agents status: Secondary | ICD-10-CM

## 2018-10-07 DIAGNOSIS — G40909 Epilepsy, unspecified, not intractable, without status epilepticus: Secondary | ICD-10-CM | POA: Diagnosis not present

## 2018-10-07 DIAGNOSIS — Z79899 Other long term (current) drug therapy: Secondary | ICD-10-CM

## 2018-10-07 DIAGNOSIS — G629 Polyneuropathy, unspecified: Secondary | ICD-10-CM | POA: Diagnosis not present

## 2018-10-07 DIAGNOSIS — Z79891 Long term (current) use of opiate analgesic: Secondary | ICD-10-CM

## 2018-10-07 DIAGNOSIS — H5711 Ocular pain, right eye: Secondary | ICD-10-CM | POA: Diagnosis present

## 2018-10-07 DIAGNOSIS — R402362 Coma scale, best motor response, obeys commands, at arrival to emergency department: Secondary | ICD-10-CM | POA: Diagnosis present

## 2018-10-07 DIAGNOSIS — L03213 Periorbital cellulitis: Secondary | ICD-10-CM | POA: Diagnosis not present

## 2018-10-07 DIAGNOSIS — Z87820 Personal history of traumatic brain injury: Secondary | ICD-10-CM | POA: Diagnosis not present

## 2018-10-07 DIAGNOSIS — Z23 Encounter for immunization: Secondary | ICD-10-CM | POA: Diagnosis not present

## 2018-10-07 DIAGNOSIS — F329 Major depressive disorder, single episode, unspecified: Secondary | ICD-10-CM | POA: Diagnosis present

## 2018-10-07 DIAGNOSIS — I10 Essential (primary) hypertension: Secondary | ICD-10-CM | POA: Diagnosis not present

## 2018-10-07 DIAGNOSIS — H0011 Chalazion right upper eyelid: Secondary | ICD-10-CM | POA: Diagnosis not present

## 2018-10-07 DIAGNOSIS — Z8249 Family history of ischemic heart disease and other diseases of the circulatory system: Secondary | ICD-10-CM | POA: Diagnosis not present

## 2018-10-07 DIAGNOSIS — G43909 Migraine, unspecified, not intractable, without status migrainosus: Secondary | ICD-10-CM | POA: Diagnosis present

## 2018-10-07 DIAGNOSIS — G44329 Chronic post-traumatic headache, not intractable: Secondary | ICD-10-CM | POA: Diagnosis present

## 2018-10-07 DIAGNOSIS — G8929 Other chronic pain: Secondary | ICD-10-CM | POA: Diagnosis present

## 2018-10-07 DIAGNOSIS — J45909 Unspecified asthma, uncomplicated: Secondary | ICD-10-CM | POA: Diagnosis present

## 2018-10-07 DIAGNOSIS — Z833 Family history of diabetes mellitus: Secondary | ICD-10-CM

## 2018-10-07 DIAGNOSIS — I6350 Cerebral infarction due to unspecified occlusion or stenosis of unspecified cerebral artery: Secondary | ICD-10-CM | POA: Diagnosis present

## 2018-10-07 LAB — CBC WITH DIFFERENTIAL/PLATELET
Abs Immature Granulocytes: 0.02 10*3/uL (ref 0.00–0.07)
Basophils Absolute: 0 10*3/uL (ref 0.0–0.1)
Basophils Relative: 0 %
Eosinophils Absolute: 0.1 10*3/uL (ref 0.0–0.5)
Eosinophils Relative: 1 %
HCT: 41.7 % (ref 36.0–46.0)
Hemoglobin: 13.1 g/dL (ref 12.0–15.0)
Immature Granulocytes: 0 %
LYMPHS ABS: 2.6 10*3/uL (ref 0.7–4.0)
Lymphocytes Relative: 39 %
MCH: 27.9 pg (ref 26.0–34.0)
MCHC: 31.4 g/dL (ref 30.0–36.0)
MCV: 88.9 fL (ref 80.0–100.0)
MONOS PCT: 7 %
Monocytes Absolute: 0.4 10*3/uL (ref 0.1–1.0)
Neutro Abs: 3.4 10*3/uL (ref 1.7–7.7)
Neutrophils Relative %: 53 %
Platelets: 198 10*3/uL (ref 150–400)
RBC: 4.69 MIL/uL (ref 3.87–5.11)
RDW: 14.4 % (ref 11.5–15.5)
WBC: 6.5 10*3/uL (ref 4.0–10.5)
nRBC: 0 % (ref 0.0–0.2)

## 2018-10-07 LAB — BASIC METABOLIC PANEL
Anion gap: 6 (ref 5–15)
BUN: 13 mg/dL (ref 6–20)
CO2: 21 mmol/L — AB (ref 22–32)
Calcium: 8.5 mg/dL — ABNORMAL LOW (ref 8.9–10.3)
Chloride: 112 mmol/L — ABNORMAL HIGH (ref 98–111)
Creatinine, Ser: 0.97 mg/dL (ref 0.44–1.00)
GFR calc Af Amer: >60 mL/min (ref >60)
GFR calc non Af Amer: >60 mL/min (ref >60)
Glucose, Bld: 92 mg/dL (ref 70–99)
Potassium: 3.9 mmol/L (ref 3.5–5.1)
Sodium: 139 mmol/L (ref 135–145)

## 2018-10-07 LAB — CBC
HCT: 40.4 % (ref 36.0–46.0)
Hemoglobin: 12.8 g/dL (ref 12.0–15.0)
MCH: 28.6 pg (ref 26.0–34.0)
MCHC: 31.7 g/dL (ref 30.0–36.0)
MCV: 90.4 fL (ref 80.0–100.0)
Platelets: 204 10*3/uL (ref 150–400)
RBC: 4.47 MIL/uL (ref 3.87–5.11)
RDW: 14.5 % (ref 11.5–15.5)
WBC: 7.4 10*3/uL (ref 4.0–10.5)
nRBC: 0 % (ref 0.0–0.2)

## 2018-10-07 LAB — PREGNANCY, URINE: Preg Test, Ur: NEGATIVE

## 2018-10-07 LAB — CREATININE, SERUM
Creatinine, Ser: 0.79 mg/dL (ref 0.44–1.00)
GFR calc Af Amer: >60 mL/min (ref >60)

## 2018-10-07 MED ORDER — POLYVINYL ALCOHOL 1.4 % OP SOLN
1.0000 [drp] | OPHTHALMIC | Status: DC | PRN
  Administered 2018-10-07: 1 [drp] via OPHTHALMIC
  Filled 2018-10-07: qty 15

## 2018-10-07 MED ORDER — LIP MEDEX EX OINT: 1.0000 "application " | TOPICAL_OINTMENT | CUTANEOUS | Status: DC | PRN

## 2018-10-07 MED ORDER — ONDANSETRON HCL 4 MG PO TABS: 4.0000 mg | ORAL_TABLET | Freq: Four times a day (QID) | ORAL | Status: DC | PRN

## 2018-10-07 MED ORDER — BISACODYL 5 MG PO TBEC: 5.0000 mg | DELAYED_RELEASE_TABLET | Freq: Every day | ORAL | Status: DC | PRN

## 2018-10-07 MED ORDER — HYDROCORTISONE 2.5 % RE CREA
1.0000 "application " | TOPICAL_CREAM | Freq: Four times a day (QID) | RECTAL | Status: DC | PRN
  Filled 2018-10-07: qty 28.35

## 2018-10-07 MED ORDER — SUCRALFATE 1 G PO TABS: 1.0000 g | ORAL_TABLET | Freq: Every day | ORAL | Status: DC | PRN

## 2018-10-07 MED ORDER — MUSCLE RUB 10-15 % EX CREA
1.0000 "application " | TOPICAL_CREAM | CUTANEOUS | Status: DC | PRN
  Filled 2018-10-07: qty 85

## 2018-10-07 MED ORDER — SODIUM CHLORIDE 0.9 % IV SOLN
INTRAVENOUS | Status: DC | PRN
  Administered 2018-10-07: 250 mL via INTRAVENOUS

## 2018-10-07 MED ORDER — SENNOSIDES-DOCUSATE SODIUM 8.6-50 MG PO TABS: 1.0000 | ORAL_TABLET | Freq: Every evening | ORAL | Status: DC | PRN

## 2018-10-07 MED ORDER — LORATADINE 10 MG PO TABS
10.0000 mg | ORAL_TABLET | Freq: Every day | ORAL | Status: DC
  Administered 2018-10-08 – 2018-10-10 (×3): 10 mg via ORAL
  Filled 2018-10-07 (×3): qty 1

## 2018-10-07 MED ORDER — VANCOMYCIN HCL 1000 MG IV SOLR
INTRAVENOUS | Status: AC
  Filled 2018-10-07: qty 2000

## 2018-10-07 MED ORDER — SUMATRIPTAN SUCCINATE 50 MG PO TABS: 50.0000 mg | ORAL_TABLET | ORAL | Status: DC | PRN

## 2018-10-07 MED ORDER — LORATADINE 10 MG PO TABS: 10.0000 mg | ORAL_TABLET | Freq: Every day | ORAL | Status: DC | PRN

## 2018-10-07 MED ORDER — SODIUM CHLORIDE 0.9 % IV BOLUS
500.0000 mL | Freq: Once | INTRAVENOUS | Status: AC
  Administered 2018-10-07: 500 mL via INTRAVENOUS

## 2018-10-07 MED ORDER — ONDANSETRON HCL 4 MG/2ML IJ SOLN: 4.0000 mg | Freq: Four times a day (QID) | INTRAMUSCULAR | Status: DC | PRN

## 2018-10-07 MED ORDER — VANCOMYCIN HCL 10 G IV SOLR
2500.0000 mg | Freq: Once | INTRAVENOUS | Status: AC
  Administered 2018-10-07: 2500 mg via INTRAVENOUS
  Filled 2018-10-07: qty 2500

## 2018-10-07 MED ORDER — LAMOTRIGINE 25 MG PO TABS: 150.0000 mg | ORAL_TABLET | Freq: Two times a day (BID) | ORAL | Status: DC

## 2018-10-07 MED ORDER — ACETAMINOPHEN 650 MG RE SUPP: 650.0000 mg | Freq: Four times a day (QID) | RECTAL | Status: DC | PRN

## 2018-10-07 MED ORDER — HYDROCODONE-ACETAMINOPHEN 5-325 MG PO TABS
1.0000 | ORAL_TABLET | ORAL | Status: DC | PRN
  Administered 2018-10-08 – 2018-10-09 (×4): 2 via ORAL
  Filled 2018-10-07 (×4): qty 2

## 2018-10-07 MED ORDER — HYDROCORTISONE 1 % EX CREA
1.0000 "application " | TOPICAL_CREAM | Freq: Three times a day (TID) | CUTANEOUS | Status: DC | PRN
  Filled 2018-10-07: qty 28

## 2018-10-07 MED ORDER — LAMOTRIGINE 100 MG PO TABS
300.0000 mg | ORAL_TABLET | Freq: Every day | ORAL | Status: DC
  Administered 2018-10-08 – 2018-10-10 (×3): 300 mg via ORAL
  Filled 2018-10-07 (×3): qty 3

## 2018-10-07 MED ORDER — OXYCODONE HCL 5 MG PO TABS
5.0000 mg | ORAL_TABLET | Freq: Every day | ORAL | Status: DC | PRN
  Administered 2018-10-07 – 2018-10-08 (×2): 5 mg via ORAL
  Filled 2018-10-07 (×3): qty 1

## 2018-10-07 MED ORDER — ALUM & MAG HYDROXIDE-SIMETH 200-200-20 MG/5ML PO SUSP: 30.0000 mL | ORAL | Status: DC | PRN

## 2018-10-07 MED ORDER — LAMOTRIGINE 100 MG PO TABS: 200.0000 mg | ORAL_TABLET | Freq: Two times a day (BID) | ORAL | Status: DC

## 2018-10-07 MED ORDER — ARIPIPRAZOLE 5 MG PO TABS
5.0000 mg | ORAL_TABLET | ORAL | Status: DC
  Administered 2018-10-08 – 2018-10-10 (×2): 5 mg via ORAL
  Filled 2018-10-07 (×2): qty 1

## 2018-10-07 MED ORDER — GABAPENTIN 300 MG PO CAPS
300.0000 mg | ORAL_CAPSULE | Freq: Every day | ORAL | Status: DC
  Administered 2018-10-07 – 2018-10-09 (×3): 300 mg via ORAL
  Filled 2018-10-07 (×3): qty 1

## 2018-10-07 MED ORDER — ALBUTEROL SULFATE HFA 108 (90 BASE) MCG/ACT IN AERS: 2.0000 | INHALATION_SPRAY | Freq: Four times a day (QID) | RESPIRATORY_TRACT | Status: DC | PRN

## 2018-10-07 MED ORDER — ENOXAPARIN SODIUM 40 MG/0.4ML ~~LOC~~ SOLN
40.0000 mg | SUBCUTANEOUS | Status: DC
  Administered 2018-10-07 – 2018-10-09 (×3): 40 mg via SUBCUTANEOUS
  Filled 2018-10-07 (×3): qty 0.4

## 2018-10-07 MED ORDER — VANCOMYCIN HCL IN DEXTROSE 1-5 GM/200ML-% IV SOLN
1000.0000 mg | Freq: Two times a day (BID) | INTRAVENOUS | Status: DC
  Administered 2018-10-08 – 2018-10-10 (×5): 1000 mg via INTRAVENOUS
  Filled 2018-10-07 (×5): qty 200

## 2018-10-07 MED ORDER — PROMETHAZINE HCL 25 MG PO TABS: 25.0000 mg | ORAL_TABLET | Freq: Two times a day (BID) | ORAL | Status: DC | PRN

## 2018-10-07 MED ORDER — GUAIFENESIN-DM 100-10 MG/5ML PO SYRP: 5.0000 mL | ORAL_SOLUTION | ORAL | Status: DC | PRN

## 2018-10-07 MED ORDER — ACETAMINOPHEN 325 MG PO TABS
650.0000 mg | ORAL_TABLET | Freq: Four times a day (QID) | ORAL | Status: DC | PRN
  Administered 2018-10-09 – 2018-10-10 (×2): 650 mg via ORAL
  Filled 2018-10-07 (×2): qty 2

## 2018-10-07 MED ORDER — INFLUENZA VAC SPLIT QUAD 0.5 ML IM SUSY
0.5000 mL | PREFILLED_SYRINGE | INTRAMUSCULAR | Status: AC
  Administered 2018-10-09: 0.5 mL via INTRAMUSCULAR
  Filled 2018-10-07: qty 0.5

## 2018-10-07 MED ORDER — SODIUM CHLORIDE 0.9 % IV SOLN
INTRAVENOUS | Status: DC
  Administered 2018-10-07 – 2018-10-09 (×3): via INTRAVENOUS

## 2018-10-07 MED ORDER — CLONAZEPAM 1 MG PO TABS
1.0000 mg | ORAL_TABLET | Freq: Every day | ORAL | Status: DC
  Administered 2018-10-07 – 2018-10-09 (×3): 1 mg via ORAL
  Filled 2018-10-07 (×3): qty 1

## 2018-10-07 MED ORDER — PHENOL 1.4 % MT LIQD
1.0000 | OROMUCOSAL | Status: DC | PRN
  Filled 2018-10-07: qty 177

## 2018-10-07 MED ORDER — FLUOXETINE HCL 20 MG PO CAPS
40.0000 mg | ORAL_CAPSULE | Freq: Every day | ORAL | Status: DC
  Administered 2018-10-08 – 2018-10-10 (×3): 40 mg via ORAL
  Filled 2018-10-07 (×3): qty 2

## 2018-10-07 MED ORDER — LAMOTRIGINE 100 MG PO TABS
200.0000 mg | ORAL_TABLET | Freq: Every day | ORAL | Status: DC
  Administered 2018-10-07 – 2018-10-09 (×3): 200 mg via ORAL
  Filled 2018-10-07 (×3): qty 2

## 2018-10-07 MED ORDER — VITAMIN D (ERGOCALCIFEROL) 1.25 MG (50000 UNIT) PO CAPS
50000.0000 [IU] | ORAL_CAPSULE | ORAL | Status: DC
  Administered 2018-10-07: 50000 [IU] via ORAL
  Filled 2018-10-07: qty 1

## 2018-10-07 MED ORDER — METOPROLOL TARTRATE 25 MG PO TABS
25.0000 mg | ORAL_TABLET | Freq: Every day | ORAL | Status: DC
  Administered 2018-10-09 – 2018-10-10 (×2): 25 mg via ORAL
  Filled 2018-10-07 (×3): qty 1

## 2018-10-07 MED ORDER — IOPAMIDOL (ISOVUE-300) INJECTION 61%
100.0000 mL | Freq: Once | INTRAVENOUS | Status: AC | PRN
  Administered 2018-10-07: 75 mL via INTRAVENOUS

## 2018-10-07 MED ORDER — TIZANIDINE HCL 4 MG PO TABS: 4.0000 mg | ORAL_TABLET | Freq: Three times a day (TID) | ORAL | Status: DC | PRN

## 2018-10-07 MED ORDER — VANCOMYCIN HCL 500 MG IV SOLR
INTRAVENOUS | Status: AC
  Filled 2018-10-07: qty 500

## 2018-10-07 MED ORDER — ALBUTEROL SULFATE (2.5 MG/3ML) 0.083% IN NEBU
2.5000 mg | INHALATION_SOLUTION | Freq: Four times a day (QID) | RESPIRATORY_TRACT | Status: DC | PRN
  Administered 2018-10-09: 2.5 mg via RESPIRATORY_TRACT
  Filled 2018-10-07: qty 3

## 2018-10-07 MED ORDER — PANTOPRAZOLE SODIUM 40 MG PO TBEC
40.0000 mg | DELAYED_RELEASE_TABLET | Freq: Every day | ORAL | Status: DC
  Administered 2018-10-08 – 2018-10-10 (×3): 40 mg via ORAL
  Filled 2018-10-07 (×3): qty 1

## 2018-10-07 MED ORDER — MORPHINE SULFATE (PF) 4 MG/ML IV SOLN
4.0000 mg | Freq: Once | INTRAVENOUS | Status: AC
  Administered 2018-10-07: 4 mg via INTRAVENOUS
  Filled 2018-10-07: qty 1

## 2018-10-07 MED ORDER — SALINE SPRAY 0.65 % NA SOLN
1.0000 | NASAL | Status: DC | PRN
  Filled 2018-10-07: qty 44

## 2018-10-07 NOTE — Plan of Care (Signed)

## 2018-10-07 NOTE — ED Notes (Signed)
Patient transported to CT 
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
## 2018-10-07 NOTE — ED Triage Notes (Signed)
Pt reports eye drainage and swelling , was seen at PCP , sent to ED for possible cellulitis and IV antibiotic. No Hx shingles.

## 2018-10-07 NOTE — ED Notes (Signed)
ED Provider at bedside. 
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
## 2018-10-07 NOTE — ED Notes (Signed)
Pt attempted to collect urine specimen but sts when she got there she could not go.  Will try again.asap.

## 2018-10-07 NOTE — Progress Notes (Signed)
Pharmacy Antibiotic Note  [name] [name] is a 53 y.o. female admitted on 10/07/2018 with periorbital cellulitis.  Pharmacy has been consulted for Vancomycin dosing.   Plan: -Vancomycin 2500mg  IV x 1 given at [facility] ED. Continue with Vancomycin 1g IV q12h for estimated AUC 538.  -Plan for Vancomycin levels at steady state, as indicated. -Monitor renal function, cultures, clinical course.   Height: 5\' 2"  (157.5 cm) Weight: 277 lb 12.5 oz (126 kg) IBW/kg (Calculated) : 50.1  Temp (24hrs), Avg:98.2 F (36.8 C), Min:98 F (36.7 C), Max:98.6 F (37 C)  Recent Labs  Lab 10/07/18 1120 10/07/18 1808  WBC 6.5 7.4  CREATININE 0.97 0.79    Estimated Creatinine Clearance: 105.7 mL/min (by C-G formula based on SCr of 0.79 mg/dL).    Allergies  Allergen Reactions  . Amoxicillin Anaphylaxis, Hives and Other (See Comments)    Has patient had a PCN reaction causing immediate rash, facial/tongue/throat swelling, SOB or lightheadedness with hypotension: Yes Has patient had a PCN reaction causing severe rash involving mucus membranes or skin necrosis: No Has patient had a PCN reaction that required hospitalization No Has patient had a PCN reaction occurring within the last 10 years: No If all of the above answers are "NO", then may proceed with Cephalosporin use.  Marland Kitchen Penicillins Anaphylaxis, Hives and Other (See Comments)    Has patient had a PCN reaction causing immediate rash, facial/tongue/throat swelling, SOB or lightheadedness with hypotension: Yes Has patient had a PCN reaction causing severe rash involving mucus membranes or skin necrosis: No Has patient had a PCN reaction that required hospitalization No Has patient had a PCN reaction occurring within the last 10 years: No If all of the above answers are "NO", then may proceed with Cephalosporin use.  . Biaxin [Clarithromycin] Hives  . Erythromycin Base Hives  . Zithromax [Azithromycin] Hives and Nausea Only    Antimicrobials this  admission: 2/11 Vancomycin >>  Dose adjustments this admission: --  Microbiology results: 2/11 BCx: sent (after first dose of antibiotic given)   Thank you for allowing pharmacy to be a part of this patient's care.   Lindell Spar, PharmD, BCPS Pager: [phone] 10/07/2018 8:43 PM

## 2018-10-08 DIAGNOSIS — R402252 Coma scale, best verbal response, oriented, at arrival to emergency department: Secondary | ICD-10-CM | POA: Diagnosis present

## 2018-10-08 DIAGNOSIS — Z825 Family history of asthma and other chronic lower respiratory diseases: Secondary | ICD-10-CM | POA: Diagnosis not present

## 2018-10-08 DIAGNOSIS — G8929 Other chronic pain: Secondary | ICD-10-CM | POA: Diagnosis present

## 2018-10-08 DIAGNOSIS — M546 Pain in thoracic spine: Secondary | ICD-10-CM | POA: Diagnosis present

## 2018-10-08 DIAGNOSIS — Z833 Family history of diabetes mellitus: Secondary | ICD-10-CM | POA: Diagnosis not present

## 2018-10-08 DIAGNOSIS — J4520 Mild intermittent asthma, uncomplicated: Secondary | ICD-10-CM | POA: Diagnosis not present

## 2018-10-08 DIAGNOSIS — Z23 Encounter for immunization: Secondary | ICD-10-CM | POA: Diagnosis not present

## 2018-10-08 DIAGNOSIS — Z8249 Family history of ischemic heart disease and other diseases of the circulatory system: Secondary | ICD-10-CM | POA: Diagnosis not present

## 2018-10-08 DIAGNOSIS — R451 Restlessness and agitation: Secondary | ICD-10-CM

## 2018-10-08 DIAGNOSIS — H0011 Chalazion right upper eyelid: Secondary | ICD-10-CM | POA: Diagnosis present

## 2018-10-08 DIAGNOSIS — Z87820 Personal history of traumatic brain injury: Secondary | ICD-10-CM | POA: Diagnosis not present

## 2018-10-08 DIAGNOSIS — Z79891 Long term (current) use of opiate analgesic: Secondary | ICD-10-CM | POA: Diagnosis not present

## 2018-10-08 DIAGNOSIS — M47816 Spondylosis without myelopathy or radiculopathy, lumbar region: Secondary | ICD-10-CM | POA: Diagnosis present

## 2018-10-08 DIAGNOSIS — F329 Major depressive disorder, single episode, unspecified: Secondary | ICD-10-CM | POA: Diagnosis present

## 2018-10-08 DIAGNOSIS — I10 Essential (primary) hypertension: Secondary | ICD-10-CM | POA: Diagnosis present

## 2018-10-08 DIAGNOSIS — R402362 Coma scale, best motor response, obeys commands, at arrival to emergency department: Secondary | ICD-10-CM | POA: Diagnosis present

## 2018-10-08 DIAGNOSIS — R402142 Coma scale, eyes open, spontaneous, at arrival to emergency department: Secondary | ICD-10-CM | POA: Diagnosis present

## 2018-10-08 DIAGNOSIS — F419 Anxiety disorder, unspecified: Secondary | ICD-10-CM | POA: Diagnosis not present

## 2018-10-08 DIAGNOSIS — Z7951 Long term (current) use of inhaled steroids: Secondary | ICD-10-CM | POA: Diagnosis not present

## 2018-10-08 DIAGNOSIS — H5711 Ocular pain, right eye: Secondary | ICD-10-CM | POA: Diagnosis present

## 2018-10-08 DIAGNOSIS — K219 Gastro-esophageal reflux disease without esophagitis: Secondary | ICD-10-CM | POA: Diagnosis present

## 2018-10-08 DIAGNOSIS — G629 Polyneuropathy, unspecified: Secondary | ICD-10-CM | POA: Diagnosis present

## 2018-10-08 DIAGNOSIS — L03213 Periorbital cellulitis: Secondary | ICD-10-CM | POA: Diagnosis not present

## 2018-10-08 DIAGNOSIS — Z791 Long term (current) use of non-steroidal anti-inflammatories (NSAID): Secondary | ICD-10-CM | POA: Diagnosis not present

## 2018-10-08 DIAGNOSIS — G43909 Migraine, unspecified, not intractable, without status migrainosus: Secondary | ICD-10-CM | POA: Diagnosis present

## 2018-10-08 DIAGNOSIS — G40909 Epilepsy, unspecified, not intractable, without status epilepticus: Secondary | ICD-10-CM | POA: Diagnosis present

## 2018-10-08 LAB — COMPREHENSIVE METABOLIC PANEL
ALT: 10 U/L (ref 0–44)
AST: 12 U/L — AB (ref 15–41)
Albumin: 3.3 g/dL — ABNORMAL LOW (ref 3.5–5.0)
Alkaline Phosphatase: 71 U/L (ref 38–126)
Anion gap: 8 (ref 5–15)
BUN: 10 mg/dL (ref 6–20)
CHLORIDE: 112 mmol/L — AB (ref 98–111)
CO2: 21 mmol/L — ABNORMAL LOW (ref 22–32)
Calcium: 8.1 mg/dL — ABNORMAL LOW (ref 8.9–10.3)
Creatinine, Ser: 0.84 mg/dL (ref 0.44–1.00)
GFR calc Af Amer: >60 mL/min (ref >60)
GFR calc non Af Amer: >60 mL/min (ref >60)
Glucose, Bld: 94 mg/dL (ref 70–99)
Potassium: 3.7 mmol/L (ref 3.5–5.1)
Sodium: 141 mmol/L (ref 135–145)
Total Bilirubin: 0.6 mg/dL (ref 0.3–1.2)
Total Protein: 6.3 g/dL — ABNORMAL LOW (ref 6.5–8.1)

## 2018-10-08 LAB — CBC
HCT: 39.5 % (ref 36.0–46.0)
HEMOGLOBIN: 12.2 g/dL (ref 12.0–15.0)
MCH: 27.7 pg (ref 26.0–34.0)
MCHC: 30.9 g/dL (ref 30.0–36.0)
MCV: 89.8 fL (ref 80.0–100.0)
Platelets: 166 10*3/uL (ref 150–400)
RBC: 4.4 MIL/uL (ref 3.87–5.11)
RDW: 14.3 % (ref 11.5–15.5)
WBC: 5.2 10*3/uL (ref 4.0–10.5)
nRBC: 0 % (ref 0.0–0.2)

## 2018-10-08 MED ORDER — DIPHENHYDRAMINE HCL 50 MG/ML IJ SOLN
25.0000 mg | Freq: Three times a day (TID) | INTRAMUSCULAR | Status: DC | PRN
  Administered 2018-10-10 (×2): 25 mg via INTRAVENOUS
  Filled 2018-10-08 (×2): qty 1

## 2018-10-08 NOTE — Progress Notes (Signed)
[facility]                                                                              Patient Demographics  [name] [name], is a 53 y.o. female, DOB - 24-Jan-1966, ZOX:[id number]  Admit date - 10/07/2018   Admitting Physician Karmen Bongo, MD  Outpatient Primary MD for the patient is [name], MD  Outpatient specialists:   LOS - 0  days   Medical records reviewed and are as summarized below:    Chief Complaint  Patient presents with  . Eye Drainage       Brief summary    [name] [name] is a 53 y.o. female with medical history significant of with past medical history of traumatic brain injury, seizure disorder, peripheral neuropathy, migraine, essential hypertension, GERD came to the hospital with complaints of right eye swelling and pain.  Patient states 2 days ago she woke up with slight swelling and erythema of her right eye mostly on her eyelid.  Over the course of 24 hours her swelling worsened.  Patient went to [facility], ER but due to long wait she ended up going back home.  The following day her symptoms were worse therefore presented to the ER today.  Patient denies any fevers, chills, trauma, any other complaints.  She denies wearing contacts.  She uses glasses.  Denies having previous symptoms in the past.  In the ER patient was noted to have unremarkable labs.  Vital signs were stable.   Assessment & Plan    Principal Problem: Right-sided periorbital cellulitis of right eye, nonpurulent -Continue IV vancomycin, CT maxillofacial shows mild extension in the subcutaneous tissue inferior to the orbit, no fluid collection or post septal inflammation. Blood cultures negative so far, continue warm compresses, supportive care  Active Problems: History of TBI, seizure disorder (HCC) -Continue Lamictal  History of depression, anxiety Continue Klonopin, Abilify, Prozac  Hypertension Currently stable, continue metoprolol  GERD Continue  PPI  History of peripheral neuropathy Continue gabapentin  History of migraines Continue triptan   Code Status: Full code DVT Prophylaxis:  Lovenox  Family Communication: Discussed in detail with the patient, all imaging results, lab results explained to the patient    Disposition Plan: Hopefully DC home in next 24 to 48 hours if improving  Time Spent in minutes 25 minutes  Procedures:  CT maxillofacial  Consultants:   None  Antimicrobials:   Anti-infectives (From admission, onward)   Start     Dose/Rate Route Frequency Ordered Stop   10/08/18 0100  vancomycin (VANCOCIN) IVPB 1000 mg/200 mL premix     1,000 mg 200 mL/hr over 60 Minutes Intravenous Every 12 hours 10/07/18 2040     10/07/18 1200  vancomycin (VANCOCIN) 2,500 mg in sodium chloride 0.9 % 500 mL IVPB     2,500 mg 250 mL/hr over 120 Minutes Intravenous  Once 10/07/18 1115 10/07/18 1456   10/07/18 1124  vancomycin (VANCOCIN) 1000 MG powder    Note to Pharmacy:  Ilda Basset   : cabinet override      10/07/18 1124 10/07/18 1434   10/07/18 1124  vancomycin (VANCOCIN) 500 MG  powder    Note to Pharmacy:  Ilda Basset   : cabinet override      10/07/18 1124 10/07/18 1435          Medications  Scheduled Meds: . ARIPiprazole  5 mg Oral QODAY  . clonazePAM  1 mg Oral QHS  . enoxaparin (LOVENOX) injection  40 mg Subcutaneous Q24H  . FLUoxetine  40 mg Oral Daily  . gabapentin  300 mg Oral QHS  . Influenza vac split quadrivalent PF  0.5 mL Intramuscular Tomorrow-1000  . lamoTRIgine  200 mg Oral QHS   And  . lamoTRIgine  300 mg Oral Daily  . loratadine  10 mg Oral Daily  . metoprolol tartrate  25 mg Oral Daily  . pantoprazole  40 mg Oral Daily  . Vitamin D (Ergocalciferol)  50,000 Units Oral Q Tue   Continuous Infusions: . sodium chloride Stopped (10/07/18 1529)  . sodium chloride 75 mL/hr at 10/07/18 1846  . vancomycin 1,000 mg (10/08/18 1300)   PRN Meds:.sodium chloride, acetaminophen **OR**  acetaminophen, albuterol, alum & mag hydroxide-simeth, bisacodyl, guaiFENesin-dextromethorphan, HYDROcodone-acetaminophen, hydrocortisone, hydrocortisone cream, lip balm, loratadine, MUSCLE RUB, ondansetron **OR** ondansetron (ZOFRAN) IV, oxyCODONE, phenol, polyvinyl alcohol, promethazine, senna-docusate, sodium chloride, sucralfate, SUMAtriptan, tiZANidine      Subjective:   [name] [name] was seen and examined today.   Patient denies dizziness, chest pain, shortness of breath, abdominal pain, N/V/D/C, new weakness, numbess, tingling.  Right-sided periorbital cellulitis, swelling still persisting  Objective:   Vitals:   10/07/18 2130 10/08/18 0500 10/08/18 0546 10/08/18 1259  BP: 126/65  103/66 (!) 105/56  Pulse: (!) 49  (!) 54 61  Resp: 16  16   Temp: 97.8 F (36.6 C)  97.8 F (36.6 C) 98.8 F (37.1 C)  TempSrc:    Oral  SpO2: 93%  98% 96%  Weight:  128.6 kg    Height:        Intake/Output Summary (Last 24 hours) at 10/08/2018 1348 Last data filed at 10/08/2018 0930 Gross per 24 hour  Intake 1845.32 ml  Output -  Net 1845.32 ml     Wt Readings from Last 3 Encounters:  10/08/18 128.6 kg  10/06/18 126.1 kg  08/11/18 123.4 kg     Exam  General: Alert and oriented x 3, NAD  Eyes: Right eye with swollen eyelid, erythema  HEENT:  Atraumatic, normocephalic  Cardiovascular: S1 S2 auscultated, Regular rate and rhythm.  Respiratory: Clear to auscultation bilaterally, no wheezing, rales or rhonchi  Gastrointestinal: Soft, nontender, nondistended, + bowel sounds  Ext: no pedal edema bilaterally  Neuro: No new deficits  Musculoskeletal: No digital cyanosis, clubbing  Skin: No rashes  Psych: Normal affect and demeanor, alert and oriented x3    Data Reviewed:  I have personally reviewed following labs and imaging studies  Micro Results Recent Results (from the past 240 hour(s))  Culture, blood (Routine X 2) w Reflex to ID Panel     Status: None (Preliminary  result)   Collection Time: 10/07/18  6:27 PM  Result Value Ref Range Status   Specimen Description   Final    BLOOD LEFT HAND Performed at [facility] [address], [place], [place] [address]    Special Requests   Final    BOTTLES DRAWN AEROBIC AND ANAEROBIC Blood Culture adequate volume Performed at [facility] [address], [place], [place] [address]    Culture   Final    NO GROWTH < 12 HOURS Performed  at [facility], [place] [address], [place], [place] [address]    Report Status PENDING  Incomplete  Culture, blood (Routine X 2) w Reflex to ID Panel     Status: None (Preliminary result)   Collection Time: 10/07/18  6:34 PM  Result Value Ref Range Status   Specimen Description   Final    BLOOD RIGHT ANTECUBITAL Performed at [facility] [address], [place], [place] [address]    Special Requests   Final    BOTTLES DRAWN AEROBIC AND ANAEROBIC Blood Culture adequate volume Performed at [facility] [address], [place], [place] [address]    Culture   Final    NO GROWTH < 12 HOURS Performed at [facility] [address], [place], [place] [address]    Report Status PENDING  Incomplete    Radiology Reports Dg Chest 2 View  Result Date: 09/20/2018 CLINICAL DATA:  Chest pain since yesterday EXAM: CHEST - 2 VIEW COMPARISON:  11/13/2017 FINDINGS: Normal heart size and mediastinal contours. Artifact from EKG leads. There is no edema, consolidation, effusion, or pneumothorax. IMPRESSION: No evidence of active disease. Electronically Signed   By: Monte Fantasia M.D.   On: 09/20/2018 15:10   Ct Maxillofacial W Contrast  Result Date: 10/07/2018 CLINICAL DATA:  Facial cellulitis. Right eye swelling, redness, and pain beginning yesterday that has spread through the right face. EXAM: CT MAXILLOFACIAL WITH CONTRAST TECHNIQUE: Multidetector CT imaging of the maxillofacial structures was  performed with intravenous contrast. Multiplanar CT image reconstructions were also generated. CONTRAST:  37mL ISOVUE-300 IOPAMIDOL (ISOVUE-300) INJECTION 61% COMPARISON:  Head CT 11/27/2016. Brain MRI 02/05/2016. No prior maxillofacial imaging. FINDINGS: Osseous: No fracture, destructive osseous process, or mandibular dislocation. Orbits: Mild-to-moderate right periorbital soft tissue swelling with mild extension in the subcutaneous tissues inferior to the orbit. No fluid collection or evidence of postseptal inflammation. Symmetric and grossly intact globes. Sinuses: Trace left maxillary sinus mucosal thickening. No fluid. Soft tissues: No additional findings. Limited intracranial: Unremarkable. IMPRESSION: Right periorbital swelling compatible with cellulitis. No abscess or postseptal inflammation. Electronically Signed   By: Logan Bores M.D.   On: 10/07/2018 12:26    Lab Data:  CBC: Recent Labs  Lab 10/07/18 1120 10/07/18 1808 10/08/18 0609  WBC 6.5 7.4 5.2  NEUTROABS 3.4  --   --   HGB 13.1 12.8 12.2  HCT 41.7 40.4 39.5  MCV 88.9 90.4 89.8  PLT 198 204 950   Basic Metabolic Panel: Recent Labs  Lab 10/07/18 1120 10/07/18 1808 10/08/18 0609  NA 139  --  141  K 3.9  --  3.7  CL 112*  --  112*  CO2 21*  --  21*  GLUCOSE 92  --  94  BUN 13  --  10  CREATININE 0.97 0.79 0.84  CALCIUM 8.5*  --  8.1*   GFR: Estimated Creatinine Clearance: 101.9 mL/min (by C-G formula based on SCr of 0.84 mg/dL). Liver Function Tests: Recent Labs  Lab 10/08/18 0609  AST 12*  ALT 10  ALKPHOS 71  BILITOT 0.6  PROT 6.3*  ALBUMIN 3.3*   No results for input(s): LIPASE, AMYLASE in the last 168 hours. No results for input(s): AMMONIA in the last 168 hours. Coagulation Profile: No results for input(s): INR, PROTIME in the last 168 hours. Cardiac Enzymes: No results for input(s): CKTOTAL, CKMB, CKMBINDEX, TROPONINI in the last 168 hours. BNP (last 3 results) No results for input(s): PROBNP in  the last 8760 hours. HbA1C: No results for input(s): HGBA1C in the last 72 hours. CBG: No results for input(s): GLUCAP in the last 168 hours. Lipid Profile: No results for input(s): CHOL, HDL, LDLCALC, TRIG, CHOLHDL, LDLDIRECT in the last 72 hours. Thyroid Function Tests: No results for input(s): TSH, T4TOTAL, FREET4, T3FREE, THYROIDAB in the last 72 hours. Anemia Panel: No results for input(s): VITAMINB12, FOLATE, FERRITIN, TIBC, IRON, RETICCTPCT in the last 72 hours. Urine analysis:    Component Value Date/Time   COLORURINE YELLOW 11/19/2017 2235   APPEARANCEUR CLEAR 11/19/2017 2235   APPEARANCEUR Clear 09/26/2014 2056   LABSPEC >1.046 (H) 11/19/2017 2235   LABSPEC 1.006 09/26/2014 2056   PHURINE 5.0 11/19/2017 2235   GLUCOSEU NEGATIVE 11/19/2017 2235   GLUCOSEU Negative 09/26/2014 2056   HGBUR SMALL (A) 11/19/2017 2235   BILIRUBINUR NEGATIVE 11/19/2017 2235   BILIRUBINUR Negative 09/26/2014 2056   KETONESUR NEGATIVE 11/19/2017 2235   PROTEINUR NEGATIVE 11/19/2017 2235   UROBILINOGEN 0.2 04/24/2015 0056   NITRITE NEGATIVE 11/19/2017 2235   LEUKOCYTESUR NEGATIVE 11/19/2017 2235   LEUKOCYTESUR Negative 09/26/2014 2056     Hillery Zachman M.D. [facility] 10/08/2018, 1:48 PM  Pager: [phone] Between 7am to 7pm - call Pager - [phone]  After 7pm go to [email or web address] - password [email or web address]  Call night coverage person covering after 7pm

## 2018-10-09 MED ORDER — OXYCODONE HCL 5 MG PO TABS
5.0000 mg | ORAL_TABLET | Freq: Two times a day (BID) | ORAL | Status: DC
  Administered 2018-10-09 – 2018-10-10 (×2): 5 mg via ORAL
  Filled 2018-10-09 (×2): qty 1

## 2018-10-09 NOTE — Progress Notes (Signed)
[facility]                                                                              Patient Demographics  [name] [name], is a 53 y.o. female, DOB - [birth year], KWI:[id number]  Admit date - 10/07/2018   Admitting Physician Karmen Bongo, MD  Outpatient Primary MD for the patient is [name], MD  Outpatient specialists:   LOS - 1  days   Medical records reviewed and are as summarized below:    Chief Complaint  Patient presents with  . Eye Drainage       Brief summary    [name] [name] is a 53 y.o. female with medical history significant of with past medical history of traumatic brain injury, seizure disorder, peripheral neuropathy, migraine, essential hypertension, GERD came to the hospital with complaints of right eye swelling and pain.  Patient states 2 days ago she woke up with slight swelling and erythema of her right eye mostly on her eyelid.  Over the course of 24 hours her swelling worsened.  Patient went to [facility], ER but due to long wait she ended up going back home.  The following day her symptoms were worse therefore presented to the ER today.  Patient denies any fevers, chills, trauma, any other complaints.  She denies wearing contacts.  She uses glasses.  Denies having previous symptoms in the past.  In the ER patient was noted to have unremarkable labs.  Vital signs were stable.   Assessment & Plan    Principal Problem: Right-sided periorbital cellulitis of right eye, nonpurulent -Continue IV vancomycin, warm compresses - CT maxillofacial shows mild extension in the subcutaneous tissue inferior to the orbit, no fluid collection or post septal inflammation. -Blood cultures negative so far -No significant improvement in the swelling of the right upper lid, concerned if it is ophthalmological problem example chalazion or cyst, ophthalmology consult called, discussed with Dr. Brigitte Pulse, will see patient today  Active Problems: History of  TBI, seizure disorder ([place]) -Continue Lamictal  History of depression, anxiety Currently stable, continue Klonopin, Abilify, Prozac  Hypertension Currently stable, continue metoprolol  GERD Continue PPI  History of peripheral neuropathy Continue gabapentin  History of migraines Continue triptan  Chronic back pain Per patient, she takes oxycodone BID at home     Code Status: Full code DVT Prophylaxis:  Lovenox  Family Communication: Discussed in detail with the patient, all imaging results, lab results explained to the patient and husband at the bedside   Disposition Plan: Will await ophthalmology evaluation  Time Spent in minutes 25 minutes  Procedures:  CT maxillofacial  Consultants:   Ophthalmology  Antimicrobials:   Anti-infectives (From admission, onward)   Start     Dose/Rate Route Frequency Ordered Stop   10/08/18 0100  vancomycin (VANCOCIN) IVPB 1000 mg/200 mL premix     1,000 mg 200 mL/hr over 60 Minutes Intravenous Every 12 hours 10/07/18 2040     10/07/18 1200  vancomycin (VANCOCIN) 2,500 mg in sodium chloride 0.9 % 500 mL IVPB     2,500 mg 250 mL/hr over 120 Minutes Intravenous  Once 10/07/18 1115 10/07/18  1456   10/07/18 1124  vancomycin (VANCOCIN) 1000 MG powder    Note to Pharmacy:  Ilda Basset   : cabinet override      10/07/18 1124 10/07/18 1434   10/07/18 1124  vancomycin (VANCOCIN) 500 MG powder    Note to Pharmacy:  Ilda Basset   : cabinet override      10/07/18 1124 10/07/18 1435         Medications  Scheduled Meds: . ARIPiprazole  5 mg Oral QODAY  . clonazePAM  1 mg Oral QHS  . enoxaparin (LOVENOX) injection  40 mg Subcutaneous Q24H  . FLUoxetine  40 mg Oral Daily  . gabapentin  300 mg Oral QHS  . lamoTRIgine  200 mg Oral QHS   And  . lamoTRIgine  300 mg Oral Daily  . loratadine  10 mg Oral Daily  . metoprolol tartrate  25 mg Oral Daily  . pantoprazole  40 mg Oral Daily  . Vitamin D (Ergocalciferol)  50,000 Units Oral Q  Tue   Continuous Infusions: . sodium chloride Stopped (10/07/18 1529)  . sodium chloride 75 mL/hr at 10/09/18 1243  . vancomycin 1,000 mg (10/09/18 1244)   PRN Meds:.sodium chloride, acetaminophen **OR** acetaminophen, albuterol, alum & mag hydroxide-simeth, bisacodyl, diphenhydrAMINE, guaiFENesin-dextromethorphan, HYDROcodone-acetaminophen, hydrocortisone, hydrocortisone cream, lip balm, loratadine, MUSCLE RUB, ondansetron **OR** ondansetron (ZOFRAN) IV, oxyCODONE, phenol, polyvinyl alcohol, promethazine, senna-docusate, sodium chloride, sucralfate, SUMAtriptan, tiZANidine      Subjective:   [name] was seen and examined today.   Continues to complain of right eyelid swelling, blurriness in the vision.  No fevers or chills.  Patient denies dizziness, chest pain, shortness of breath, abdominal pain, N/V/D/C, new weakness, numbess, tingling.  No significant improvement in the swelling.  Objective:   Vitals:   10/08/18 1259 10/08/18 1959 10/09/18 0559 10/09/18 1310  BP: (!) 105/56 131/65 (!) 117/51 (!) 118/44  Pulse: 61 (!) 53 60 65  Resp:  18 18 18   Temp: 98.8 F (37.1 C) 97.8 F (36.6 C) 97.8 F (36.6 C) 98 F (36.7 C)  TempSrc: Oral Oral Oral Oral  SpO2: 96% 99% 97% 97%  Weight:   129.5 kg   Height:        Intake/Output Summary (Last 24 hours) at 10/09/2018 1405 Last data filed at 10/09/2018 0945 Gross per 24 hour  Intake 2401.03 ml  Output -  Net 2401.03 ml     Wt Readings from Last 3 Encounters:  10/09/18 129.5 kg  10/06/18 126.1 kg  08/11/18 123.4 kg   Physical Exam  General: Alert and oriented x 3, NAD  Eyes: Right eyelid swollen with erythema, tenderness  HEENT:    Cardiovascular: S1 S2 clear, RRR. No pedal edema b/l  Respiratory: CTAB  Gastrointestinal: Soft, nontender, nondistended, NBS  Ext: no pedal edema bilaterally  Neuro: no new deficits  Musculoskeletal: No cyanosis, clubbing  Skin: No rashes  Psych: Normal affect and demeanor,  alert and oriented x3     Data Reviewed:  I have personally reviewed following labs and imaging studies  Micro Results Recent Results (from the past 240 hour(s))  Culture, blood (Routine X 2) w Reflex to ID Panel     Status: None (Preliminary result)   Collection Time: 10/07/18  6:27 PM  Result Value Ref Range Status   Specimen Description   Final    BLOOD LEFT HAND Performed at [facility] [address], [place], [place] [address]    Special Requests   Final  BOTTLES DRAWN AEROBIC AND ANAEROBIC Blood Culture adequate volume Performed at [facility] [address] [place], [place] [address]    Culture   Final    NO GROWTH 2 DAYS Performed at [facility] [address], [place], [place] [address]    Report Status PENDING  Incomplete  Culture, blood (Routine X 2) w Reflex to ID Panel     Status: None (Preliminary result)   Collection Time: 10/07/18  6:34 PM  Result Value Ref Range Status   Specimen Description   Final    BLOOD RIGHT ANTECUBITAL Performed at [facility] [address] [place], [place] [address]    Special Requests   Final    BOTTLES DRAWN AEROBIC AND ANAEROBIC Blood Culture adequate volume Performed at [facility] [address], [place], [place] [address]    Culture   Final    NO GROWTH 2 DAYS Performed at [facility] [address] [place], [place] [address]    Report Status PENDING  Incomplete    Radiology Reports Dg Chest 2 View  Result Date: 09/20/2018 CLINICAL DATA:  Chest pain since yesterday EXAM: CHEST - 2 VIEW COMPARISON:  11/13/2017 FINDINGS: Normal heart size and mediastinal contours. Artifact from EKG leads. There is no edema, consolidation, effusion, or pneumothorax. IMPRESSION: No evidence of active disease. Electronically Signed   By: Monte Fantasia M.D.   On: 09/20/2018 15:10   Ct Maxillofacial W Contrast  Result Date:  10/07/2018 CLINICAL DATA:  Facial cellulitis. Right eye swelling, redness, and pain beginning yesterday that has spread through the right face. EXAM: CT MAXILLOFACIAL WITH CONTRAST TECHNIQUE: Multidetector CT imaging of the maxillofacial structures was performed with intravenous contrast. Multiplanar CT image reconstructions were also generated. CONTRAST:  70mL ISOVUE-300 IOPAMIDOL (ISOVUE-300) INJECTION 61% COMPARISON:  Head CT 11/27/2016. Brain MRI 02/05/2016. No prior maxillofacial imaging. FINDINGS: Osseous: No fracture, destructive osseous process, or mandibular dislocation. Orbits: Mild-to-moderate right periorbital soft tissue swelling with mild extension in the subcutaneous tissues inferior to the orbit. No fluid collection or evidence of postseptal inflammation. Symmetric and grossly intact globes. Sinuses: Trace left maxillary sinus mucosal thickening. No fluid. Soft tissues: No additional findings. Limited intracranial: Unremarkable. IMPRESSION: Right periorbital swelling compatible with cellulitis. No abscess or postseptal inflammation. Electronically Signed   By: Logan Bores M.D.   On: 10/07/2018 12:26    Lab Data:  CBC: Recent Labs  Lab 10/07/18 1120 10/07/18 1808 10/08/18 0609  WBC 6.5 7.4 5.2  NEUTROABS 3.4  --   --   HGB 13.1 12.8 12.2  HCT 41.7 40.4 39.5  MCV 88.9 90.4 89.8  PLT 198 204 030   Basic Metabolic Panel: Recent Labs  Lab 10/07/18 1120 10/07/18 1808 10/08/18 0609  NA 139  --  141  K 3.9  --  3.7  CL 112*  --  112*  CO2 21*  --  21*  GLUCOSE 92  --  94  BUN 13  --  10  CREATININE 0.97 0.79 0.84  CALCIUM 8.5*  --  8.1*   GFR: Estimated Creatinine Clearance: 102.4 mL/min (by C-G formula based on SCr of 0.84 mg/dL). Liver Function Tests: Recent Labs  Lab 10/08/18 0609  AST 12*  ALT 10  ALKPHOS 71  BILITOT 0.6  PROT 6.3*  ALBUMIN 3.3*   No results for input(s): LIPASE, AMYLASE in the last 168 hours. No results for input(s): AMMONIA in the last  168 hours. Coagulation  Profile: No results for input(s): INR, PROTIME in the last 168 hours. Cardiac Enzymes: No results for input(s): CKTOTAL, CKMB, CKMBINDEX, TROPONINI in the last 168 hours. BNP (last 3 results) No results for input(s): PROBNP in the last 8760 hours. HbA1C: No results for input(s): HGBA1C in the last 72 hours. CBG: No results for input(s): GLUCAP in the last 168 hours. Lipid Profile: No results for input(s): CHOL, HDL, LDLCALC, TRIG, CHOLHDL, LDLDIRECT in the last 72 hours. Thyroid Function Tests: No results for input(s): TSH, T4TOTAL, FREET4, T3FREE, THYROIDAB in the last 72 hours. Anemia Panel: No results for input(s): VITAMINB12, FOLATE, FERRITIN, TIBC, IRON, RETICCTPCT in the last 72 hours. Urine analysis:    Component Value Date/Time   COLORURINE YELLOW 11/19/2017 2235   APPEARANCEUR CLEAR 11/19/2017 2235   APPEARANCEUR Clear 09/26/2014 2056   LABSPEC >1.046 (H) 11/19/2017 2235   LABSPEC 1.006 09/26/2014 2056   PHURINE 5.0 11/19/2017 2235   GLUCOSEU NEGATIVE 11/19/2017 2235   GLUCOSEU Negative 09/26/2014 2056   HGBUR SMALL (A) 11/19/2017 2235   BILIRUBINUR NEGATIVE 11/19/2017 2235   BILIRUBINUR Negative 09/26/2014 2056   KETONESUR NEGATIVE 11/19/2017 2235   PROTEINUR NEGATIVE 11/19/2017 2235   UROBILINOGEN 0.2 04/24/2015 0056   NITRITE NEGATIVE 11/19/2017 2235   LEUKOCYTESUR NEGATIVE 11/19/2017 2235   LEUKOCYTESUR Negative 09/26/2014 2056     Ripudeep Rai M.D. [facility] 10/09/2018, 2:05 PM  Pager: [phone] Between 7am to 7pm - call Pager - [phone]  After 7pm go to [email or web address] - password [email or web address]  Call night coverage person covering after 7pm

## 2018-10-10 LAB — BASIC METABOLIC PANEL
Anion gap: 7 (ref 5–15)
BUN: 10 mg/dL (ref 6–20)
CO2: 20 mmol/L — ABNORMAL LOW (ref 22–32)
Calcium: 8.5 mg/dL — ABNORMAL LOW (ref 8.9–10.3)
Chloride: 112 mmol/L — ABNORMAL HIGH (ref 98–111)
Creatinine, Ser: 0.76 mg/dL (ref 0.44–1.00)
GFR calc Af Amer: >60 mL/min (ref >60)
GLUCOSE: 93 mg/dL (ref 70–99)
Potassium: 3.7 mmol/L (ref 3.5–5.1)
Sodium: 139 mmol/L (ref 135–145)

## 2018-10-10 MED ORDER — BACITRACIN-POLYMYXIN B 500-10000 UNIT/GM OP OINT: TOPICAL_OINTMENT | Freq: Three times a day (TID) | OPHTHALMIC | 3 refills | Status: DC

## 2018-10-10 MED ORDER — DIPHENHYDRAMINE HCL 25 MG PO CAPS: 25.0000 mg | ORAL_CAPSULE | Freq: Three times a day (TID) | ORAL | Status: DC | PRN

## 2018-10-10 MED ORDER — HYDROCORTISONE 2.5 % RE CREA: 1.0000 "application " | TOPICAL_CREAM | Freq: Four times a day (QID) | RECTAL | 0 refills | Status: DC | PRN

## 2018-10-10 MED ORDER — BACITRACIN-POLYMYXIN B 500-10000 UNIT/GM OP OINT
TOPICAL_OINTMENT | Freq: Three times a day (TID) | OPHTHALMIC | Status: DC
  Administered 2018-10-10: 1 via OPHTHALMIC
  Filled 2018-10-10: qty 3.5

## 2018-10-10 MED ORDER — DOXYCYCLINE HYCLATE 100 MG PO CAPS: 100.0000 mg | ORAL_CAPSULE | Freq: Two times a day (BID) | ORAL | 0 refills | Status: AC

## 2018-10-10 MED ORDER — HYDROCORTISONE 2.5 % RE CREA: 1.0000 "application " | TOPICAL_CREAM | Freq: Four times a day (QID) | RECTAL | 0 refills | Status: AC | PRN

## 2018-10-10 MED ORDER — BACITRACIN-POLYMYXIN B 500-10000 UNIT/GM OP OINT
TOPICAL_OINTMENT | Freq: Three times a day (TID) | OPHTHALMIC | Status: DC
  Filled 2018-10-10: qty 3.5

## 2018-10-10 NOTE — Progress Notes (Signed)
Patient has discharged to home on 10/10/2018. Discharge instruction including medication and appointment was given to patient. Patient has no question at this time.

## 2018-10-10 NOTE — Care Management Note (Signed)
Case Management Note  Patient Details  Name: AMIRAH GOERKE MRN: [id number] Date of Birth: June 07, 1966  Subjective/Objective:                  discharged  Action/Plan: Discharged to home with self-care, orders checked for hhc needs. No CM needs present at time of discharge.  Patient is able to arrangement own appointments and home care.  Expected Discharge Date:  10/10/18               Expected Discharge Plan:  Home/Self Care  In-House Referral:     Discharge planning Services  CM Consult  Post Acute Care Choice:  NA Choice offered to:  NA  DME Arranged:    DME Agency:     HH Arranged:    HH Agency:     Status of Service:  Completed, signed off  If discussed at [employer] of Stay Meetings, dates discussed:    Additional Comments:  Leeroy Cha, RN 10/10/2018, 11:13 AM

## 2018-10-10 NOTE — Progress Notes (Signed)
Pharmacy IV to PO conversion  The patient is receiving diphenhydramine by the intravenous route.  Based on the following criteria approved by the Pharmacy and [place], diphenhydramine is being converted to the equivalent oral dose form.   Not prescribed to treat or prevent a severe allergic reaction   Not prescribed as premedication prior to receiving blood product, biologic medication, antimicrobial, or chemotherapy agent   The patient has tolerated at least one dose of an oral or enteral medication   The patient has no evidence of active gastrointestinal bleeding or impaired GI absorption (gastrectomy, short bowel, patient on TNA or NPO).   The patient is not undergoing procedural sedation  If you have any questions about this conversion, please contact the Pharmacy Department (ext [phone]).  Thank you.  Reuel Boom, PharmD, BCPS [phone] 10/10/2018, 10:14 AM

## 2018-10-10 NOTE — Discharge Summary (Signed)
Physician Discharge Summary   Patient ID: [name] [name] MRN: [id number] DOB/AGE: November 19, 1965 53 y.o.  Admit date: 10/07/2018 Discharge date: 10/10/2018  Primary Care Physician:  [name], MD   Recommendations for Outpatient Follow-up:  1. Follow up with PCP in 1-2 weeks 2. Patient discharged on doxycycline for 5 days, bacitracin eye ointment for 7 days  Home Health: At baseline Equipment/Devices:   Discharge Condition: stable CODE STATUS: FULL Diet recommendation: Heart healthy diet   Discharge Diagnoses:    . Periorbital cellulitis of right eye with right eye chalazion . Agitation . Anxiety .  Asthma, mild intermittent . Atrial fibrillation ([place]) . Cerebral artery occlusion with cerebral infarction ([place]) . Chronic post-traumatic headache, not intractable . TBI (traumatic brain injury) [facility])   Consults: Ophthalmology, Dr. Alanda Slim    Allergies:   Allergies  Allergen Reactions  . Amoxicillin Anaphylaxis, Hives and Other (See Comments)    Has patient had a PCN reaction causing immediate rash, facial/tongue/throat swelling, SOB or lightheadedness with hypotension: Yes Has patient had a PCN reaction causing severe rash involving mucus membranes or skin necrosis: No Has patient had a PCN reaction that required hospitalization No Has patient had a PCN reaction occurring within the last 10 years: No If all of the above answers are "NO", then may proceed with Cephalosporin use.  Marland Kitchen Penicillins Anaphylaxis, Hives and Other (See Comments)    Has patient had a PCN reaction causing immediate rash, facial/tongue/throat swelling, SOB or lightheadedness with hypotension: Yes Has patient had a PCN reaction causing severe rash involving mucus membranes or skin necrosis: No Has patient had a PCN reaction that required hospitalization No Has patient had a PCN reaction occurring within the last 10 years: No If all of the above answers are "NO", then may proceed with Cephalosporin  use.  . Biaxin [Clarithromycin] Hives  . Erythromycin Base Hives  . Zithromax [Azithromycin] Hives and Nausea Only     DISCHARGE MEDICATIONS: Allergies as of 10/10/2018      Reactions   Amoxicillin Anaphylaxis, Hives, Other (See Comments)   Has patient had a PCN reaction causing immediate rash, facial/tongue/throat swelling, SOB or lightheadedness with hypotension: Yes Has patient had a PCN reaction causing severe rash involving mucus membranes or skin necrosis: No Has patient had a PCN reaction that required hospitalization No Has patient had a PCN reaction occurring within the last 10 years: No If all of the above answers are "NO", then may proceed with Cephalosporin use.   Penicillins Anaphylaxis, Hives, Other (See Comments)   Has patient had a PCN reaction causing immediate rash, facial/tongue/throat swelling, SOB or lightheadedness with hypotension: Yes Has patient had a PCN reaction causing severe rash involving mucus membranes or skin necrosis: No Has patient had a PCN reaction that required hospitalization No Has patient had a PCN reaction occurring within the last 10 years: No If all of the above answers are "NO", then may proceed with Cephalosporin use.   Biaxin [clarithromycin] Hives   Erythromycin Base Hives   Zithromax [azithromycin] Hives, Nausea Only      Medication List    TAKE these medications   AJOVY 225 MG/1.5ML Sosy Generic drug:  Fremanezumab-vfrm INJECT CONTENTS OF 1 SYRINGE (225 MG) INTO THE SKIN EVERY 30 DAYS What changed:  See the new instructions.   albuterol (2.5 MG/3ML) 0.083% nebulizer solution Commonly known as:  PROVENTIL Take 2.5 mg by nebulization every 6 (six) hours as needed for wheezing or shortness of breath.   albuterol 108 (90 Base)  MCG/ACT inhaler Commonly known as:  PROVENTIL HFA;VENTOLIN HFA Inhale 2 puffs into the lungs every 6 (six) hours as needed for wheezing or shortness of breath.   ARIPiprazole 5 MG tablet Commonly known as:   ABILIFY Take 5 mg by mouth every other day.   bacitracin-polymyxin b ophthalmic ointment Commonly known as:  POLYSPORIN Place into the right eye 3 (three) times daily. apply to the right eye while awake   cetirizine 10 MG tablet Commonly known as:  ZYRTEC Take 10 mg by mouth daily as needed for allergies.   clonazePAM 1 MG tablet Commonly known as:  KLONOPIN TAKE 1 TABLET BY MOUTH AT NIGHT AS NEEDED, USE AFTER FACETTE INJECTIONS What changed:    how much to take  how to take this  when to take this  additional instructions   diclofenac sodium 1 % Gel Commonly known as:  VOLTAREN Apply 2 g topically as needed for pain. knee   doxycycline 100 MG capsule Commonly known as:  VIBRAMYCIN Take 1 capsule (100 mg total) by mouth 2 (two) times daily for 5 days. Start taking on:  October 11, 2018   estradiol 1 MG tablet Commonly known as:  ESTRACE TAKE 1mg   TABLET BY MOUTH EVERY DAY What changed:    how much to take  how to take this  when to take this  additional instructions   FLUoxetine 40 MG capsule Commonly known as:  PROZAC Take 1 capsule (40 mg total) by mouth daily.   fluticasone 50 MCG/ACT nasal spray Commonly known as:  FLONASE Place 1 spray into the nose daily.   gabapentin 300 MG capsule Commonly known as:  NEURONTIN TAKE 1 CAPSULE (300 MG TOTAL) AT BEDTIME BY MOUTH. What changed:  when to take this   hydrocortisone 2.5 % rectal cream Commonly known as:  ANUSOL-HC Apply 1 application topically 4 (four) times daily as needed for hemorrhoids.   hyoscyamine 0.125 MG SL tablet Commonly known as:  LEVSIN SL Take 0.125 mg by mouth daily as needed for cramping.   lamoTRIgine 150 MG tablet Commonly known as:  LAMICTAL TAKE 1 TABLET (150 MG TOTAL) 2 (TWO) TIMES DAILY BY MOUTH.   lamoTRIgine 100 MG tablet Commonly known as:  LAMICTAL Take 200-300 mg by mouth 2 (two) times daily. 300 mg in the morning and 200 mg at night   lidocaine 5 % Commonly  known as:  LIDODERM Place 1 patch onto the skin daily for 30 doses. Remove & Discard patch within 12 hours or as directed by MD   meclizine 25 MG tablet Commonly known as:  ANTIVERT Take 25 mg by mouth daily as needed for dizziness.   metoprolol tartrate 25 MG tablet Commonly known as:  LOPRESSOR Take 25 mg by mouth daily.   omeprazole 40 MG capsule Commonly known as:  PRILOSEC Take 40 mg by mouth daily.   oxyCODONE 5 MG immediate release tablet Commonly known as:  Oxy IR/ROXICODONE Take 1 tablet (5 mg total) by mouth daily as needed for severe pain. Rx to last for 90 days.   promethazine 25 MG tablet Commonly known as:  PHENERGAN Take 25 mg by mouth every 12 (twelve) hours as needed for nausea.   sucralfate 1 g tablet Commonly known as:  CARAFATE Take 1 g by mouth daily as needed.   SUMAtriptan 50 MG tablet Commonly known as:  IMITREX Take 1 tablet (50 mg total) by mouth every 2 (two) hours as needed for migraine. May repeat in 2  hours if headache persists or recurs. Do not take more then 2 in one day or 12 in 1 month.   tiZANidine 4 MG tablet Commonly known as:  ZANAFLEX Take 1 tablet (4 mg total) by mouth every 8 (eight) hours as needed for muscle spasms. What changed:  when to take this   Vitamin D (Ergocalciferol) 1.25 MG (50000 UT) Caps capsule Commonly known as:  DRISDOL Take 1 capsule (50,000 Units total) by mouth every Tuesday.        Brief H and P: For complete details please refer to admission H and P, but in brief [name] [name] a 53 y.o.femalewith medical history significant ofwith past medical history of traumatic brain injury, seizure disorder, peripheral neuropathy, migraine, essential hypertension, GERD came to the hospital with complaints of right eye swelling and pain. Patient states 2 days ago she woke up with slight swelling and erythema of her right eye mostly on her eyelid. Over the course of 24 hours her swelling worsened. Patient went to  [facility], ER but due to long wait she ended up going back home. The following day her symptoms were worse therefore presented to the ER today. Patient denies any fevers, chills, trauma, any other complaints. She denies wearing contacts. She uses glasses. Denies having previous symptoms in the past. In the ER patient was noted to have unremarkable labs. Vital signs were stable.   Hospital Course:  Right-sided periorbital cellulitis of right eye with chalazion -Was initially placed on IV vancomycin with warm compresses  - CT maxillofacial shows mild extension in the subcutaneous tissue inferior to the orbit, no fluid collection or post septal inflammation. -Blood cultures negative so far -Ophthalmology was consulted, patient was seen by Dr. Alanda Slim.  Performed chalaziectomy at bedside.  Patient feels a lot better, recommended transition to oral antibiotics for 5 days and bacitracin ointment for 7 days.  Patient will follow-up with Dr. Alanda Slim in 1 week   History of TBI, seizure disorder ([place]) -Continue Lamictal  History of depression, anxiety Currently stable, continue Klonopin, Abilify, Prozac  Hypertension Currently stable, continue metoprolol  GERD Continue PPI  History of peripheral neuropathy Continue gabapentin  History of migraines Continue triptan  Chronic back pain Per patient, she takes oxycodone BID at home       Day of Discharge S: Feels a lot better after chalaziectomy.  No fevers, no complaints.  BP 136/71 (BP Location: Right Arm)   Pulse (!) 58   Temp 98.8 F (37.1 C) (Oral)   Resp 20   Ht 5\' 2"  (1.575 m)   Wt 129.6 kg   LMP  (LMP Unknown) Comment: 2005  SpO2 98%   BMI 52.26 kg/m   Physical Exam: General: Alert and awake oriented x3 not in any acute distress. HEENT: Right eyelid swelling, erythema better CVS: S1-S2 clear no murmur rubs or gallops Chest: clear to auscultation bilaterally, no wheezing rales or rhonchi Abdomen: soft  nontender, nondistended, normal bowel sounds Extremities: no cyanosis, clubbing or edema noted bilaterally Neuro: Cranial nerves II-XII intact, no focal neurological deficits   The results of significant diagnostics from this hospitalization (including imaging, microbiology, ancillary and laboratory) are listed below for reference.      Procedures/Studies:  Dg Chest 2 View  Result Date: 09/20/2018 CLINICAL DATA:  Chest pain since yesterday EXAM: CHEST - 2 VIEW COMPARISON:  11/13/2017 FINDINGS: Normal heart size and mediastinal contours. Artifact from EKG leads. There is no edema, consolidation, effusion, or pneumothorax. IMPRESSION: No evidence of  active disease. Electronically Signed   By: Monte Fantasia M.D.   On: 09/20/2018 15:10   Ct Maxillofacial W Contrast  Result Date: 10/07/2018 CLINICAL DATA:  Facial cellulitis. Right eye swelling, redness, and pain beginning yesterday that has spread through the right face. EXAM: CT MAXILLOFACIAL WITH CONTRAST TECHNIQUE: Multidetector CT imaging of the maxillofacial structures was performed with intravenous contrast. Multiplanar CT image reconstructions were also generated. CONTRAST:  83mL ISOVUE-300 IOPAMIDOL (ISOVUE-300) INJECTION 61% COMPARISON:  Head CT 11/27/2016. Brain MRI 02/05/2016. No prior maxillofacial imaging. FINDINGS: Osseous: No fracture, destructive osseous process, or mandibular dislocation. Orbits: Mild-to-moderate right periorbital soft tissue swelling with mild extension in the subcutaneous tissues inferior to the orbit. No fluid collection or evidence of postseptal inflammation. Symmetric and grossly intact globes. Sinuses: Trace left maxillary sinus mucosal thickening. No fluid. Soft tissues: No additional findings. Limited intracranial: Unremarkable. IMPRESSION: Right periorbital swelling compatible with cellulitis. No abscess or postseptal inflammation. Electronically Signed   By: Logan Bores M.D.   On: 10/07/2018 12:26        LAB RESULTS: Basic Metabolic Panel: Recent Labs  Lab 10/08/18 0609 10/10/18 0542  NA 141 139  K 3.7 3.7  CL 112* 112*  CO2 21* 20*  GLUCOSE 94 93  BUN 10 10  CREATININE 0.84 0.76  CALCIUM 8.1* 8.5*   Liver Function Tests: Recent Labs  Lab 10/08/18 0609  AST 12*  ALT 10  ALKPHOS 71  BILITOT 0.6  PROT 6.3*  ALBUMIN 3.3*   No results for input(s): LIPASE, AMYLASE in the last 168 hours. No results for input(s): AMMONIA in the last 168 hours. CBC: Recent Labs  Lab 10/07/18 1120 10/07/18 1808 10/08/18 0609  WBC 6.5 7.4 5.2  NEUTROABS 3.4  --   --   HGB 13.1 12.8 12.2  HCT 41.7 40.4 39.5  MCV 88.9 90.4 89.8  PLT 198 204 166   Cardiac Enzymes: No results for input(s): CKTOTAL, CKMB, CKMBINDEX, TROPONINI in the last 168 hours. BNP: Invalid input(s): POCBNP CBG: No results for input(s): GLUCAP in the last 168 hours.    Disposition and Follow-up: Discharge Instructions    Diet - low sodium heart healthy   Complete by:  As directed    Increase activity slowly   Complete by:  As directed        DISPOSITION: Home   DISCHARGE FOLLOW-UP Follow-up Information    Mincey, Neena Rhymes, MD. Schedule an appointment as soon as possible for a visit in 1 week(s).   Specialty:  Ophthalmology Why:  for hospital follow-up Contact information: [place] [phone]            Time coordinating discharge:  35 minutes  Signed:   Estill Cotta M.D. [facility] 10/10/2018, 12:06 PM

## 2018-10-10 NOTE — Discharge Instructions (Signed)
Chalazion  A chalazion is a swelling or lump on the eyelid. It can affect the upper eyelid or the lower eyelid. What are the causes? This condition may be caused by:  Long-lasting (chronic) inflammation of the eyelid glands.  A blocked oil gland in the eyelid. What are the signs or symptoms? Symptoms of this condition include:  Swelling of the eyelid. The swelling may spread to areas around the eye.  A hard lump on the eyelid.  Blurry vision. The lump on the eyelid may make it hard to see out of the eye. How is this diagnosed? This condition is diagnosed with an examination of the eye. How is this treated? This condition is treated by applying a warm compress to the eyelid. If the condition does not improve, it may be treated with:  Medicine that is injected into the chalazion by a health care provider.  Surgery.  Medicine that is applied to the eye. Follow these instructions at home: Managing pain and swelling  Apply a warm, moist compress to the eyelid 4-6 times a day for 10-15 minutes at a time. This will help to open any blocked glands and to reduce redness and swelling.  Apply over-the-counter and prescription medicines only as told by your health care provider. General instructions  Do not touch the chalazion.  Do not try to remove the pus. Do not squeeze the chalazion or stick it with a pin or needle.  Do not rub your eyes.  Wash your hands often. Dry your hands with a clean towel.  Keep your face, scalp, and eyebrows clean.  Avoid wearing eye makeup.  If the chalazion does not break open (rupture) on its own, return to your health care provider.  Keep all follow-up appointments as told by your health care provider. This is important. Contact a health care provider if:  Your eyelid has not improved in 4 weeks.  Your eyelid is getting worse.  You have a fever.  The chalazion does not rupture on its own after a month of home treatment. Get help right  away if:  You have pain in your eye.  Your vision changes.  The chalazion becomes painful or red.  The chalazion gets bigger. Summary  A chalazion is a swelling or lump on the upper or lower eyelid.  It may be caused by chronic inflammation or a blocked oil gland.  Apply a warm, moist compress to the eyelid 4-6 times a day for 10-15 minutes at a time.  Keep your face, scalp, and eyebrows clean. This information is not intended to replace advice given to you by your health care provider. Make sure you discuss any questions you have with your health care provider. Document Released: 08/10/2000 Document Revised: 01/30/2018 Document Reviewed: 01/30/2018 [employer] Interactive Patient Education  2019 [employer]  
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
## 2018-10-12 LAB — CULTURE, BLOOD (ROUTINE X 2)
Culture: NO GROWTH
Culture: NO GROWTH
SPECIAL REQUESTS: ADEQUATE
Special Requests: ADEQUATE

## 2018-10-23 ENCOUNTER — Ambulatory Visit
Payer: BC Managed Care – PPO | Attending: Student in an Organized Health Care Education/Training Program | Admitting: Student in an Organized Health Care Education/Training Program

## 2018-10-23 ENCOUNTER — Encounter: Payer: Self-pay | Admitting: Student in an Organized Health Care Education/Training Program

## 2018-10-23 ENCOUNTER — Other Ambulatory Visit: Payer: Self-pay

## 2018-10-23 VITALS — BP 127/86 | HR 64 | Temp 98.2°F | Resp 16 | Ht 62.5 in | Wt 276.0 lb

## 2018-10-23 DIAGNOSIS — M461 Sacroiliitis, not elsewhere classified: Secondary | ICD-10-CM

## 2018-10-23 DIAGNOSIS — M7918 Myalgia, other site: Secondary | ICD-10-CM | POA: Diagnosis not present

## 2018-10-23 DIAGNOSIS — M7062 Trochanteric bursitis, left hip: Secondary | ICD-10-CM | POA: Diagnosis present

## 2018-10-23 DIAGNOSIS — M533 Sacrococcygeal disorders, not elsewhere classified: Secondary | ICD-10-CM | POA: Insufficient documentation

## 2018-10-23 DIAGNOSIS — M545 Low back pain, unspecified: Secondary | ICD-10-CM

## 2018-10-23 DIAGNOSIS — G894 Chronic pain syndrome: Secondary | ICD-10-CM

## 2018-10-23 DIAGNOSIS — M47816 Spondylosis without myelopathy or radiculopathy, lumbar region: Secondary | ICD-10-CM | POA: Diagnosis present

## 2018-10-23 DIAGNOSIS — M47818 Spondylosis without myelopathy or radiculopathy, sacral and sacrococcygeal region: Secondary | ICD-10-CM | POA: Diagnosis present

## 2018-10-23 MED ORDER — TIZANIDINE HCL 4 MG PO TABS: 4.0000 mg | ORAL_TABLET | Freq: Every day | ORAL | 3 refills | Status: DC

## 2018-10-23 MED ORDER — OXYCODONE HCL 5 MG PO TABS: 5.0000 mg | ORAL_TABLET | Freq: Every day | ORAL | 0 refills | Status: DC | PRN

## 2018-10-23 MED ORDER — DICLOFENAC EPOLAMINE 1.3 % TD PTCH: 1.0000 | MEDICATED_PATCH | Freq: Two times a day (BID) | TRANSDERMAL | 1 refills | Status: DC | PRN

## 2018-10-23 NOTE — Progress Notes (Signed)
Nursing Pain Medication Assessment:  Safety precautions to be maintained throughout the outpatient stay will include: orient to surroundings, keep bed in low position, maintain call bell within reach at all times, provide assistance with transfer out of bed and ambulation.  Medication Inspection Compliance: [name] [name] did not comply with our request to bring her pills to be counted. She was reminded that bringing the medication bottles, even when empty, is a requirement.  Medication: None brought in. Pill/Patch Count: None available to be counted. Bottle Appearance: No container available. Did not bring bottle(s) to appointment. Filled Date: N/A Last Medication intake:  Yesterday

## 2018-10-23 NOTE — Progress Notes (Signed)
Patient's Name: [name] [name]  MRN: [id number]  Referring Provider: [name], MD  DOB: 11-05-1965  PCP: [name] Pink, MD  DOS: 10/23/2018  Note by: [name] Santa, MD  Service setting: Ambulatory outpatient  Specialty: Interventional Pain Management  Location: [facility] (AMB) Pain Management Facility    Patient type: Established   Primary Reason(s) for Visit: Encounter for prescription drug management & post-procedure evaluation of chronic illness with mild to moderate exacerbation(Level of risk: moderate) CC: Back Pain (lower, mid) and Hip Pain (bilateral, left is worse)  HPI  [name] [name] is a 53 y.o. year old, female patient, who comes today for a post-procedure evaluation and medication management. She has Incisional hernia, without obstruction or gangrene; GERD (gastroesophageal reflux disease); Obesity, Class III, BMI 40-49.9 (morbid obesity) ([place]); Generalized convulsive epilepsy ([place]); History of recent fall; Paresthesia; CVA (cerebral infarction); Right sided weakness; Seizure disorder ([place]); Chronic headaches; Atrial fibrillation ([place]); Right arm weakness; Asthma, mild intermittent; TIA (transient ischemic attack); Morbid obesity ([place]); Intractable migraine with aura with status migrainosus; Chronic post-traumatic headache, not intractable; Migraine with aura and with status migrainosus, not intractable; Subarachnoid hemorrhage following injury, with loss of consciousness ([place]); TBI (traumatic brain injury) ([place]); Partial symptomatic epilepsy with complex partial seizures, not intractable, without status epilepticus ([place]); Headache; DDD (degenerative disc disease), lumbosacral; Lumbosacral radiculopathy; Surgical menopause on hormone replacement therapy; Incontinence in female; Status post TAH-BSO; Prediabetes; Vitamin D deficiency; Agitation; Pseudoseizure; Seizure-like activity ([place]); Seizures ([place]); Other parasomnia; Intractable migraine without aura and without status migrainosus; Nocturnal  seizures ([place]); Anxiety; Asthma; Cerebral artery occlusion with cerebral infarction ([place]); Generalized muscle weakness; History of CVA (cerebrovascular accident) without residual deficits; Other symptoms and signs involving the musculoskeletal system; Skin sensation disturbance; Migraine with aura; Migraines; Convulsions ([place]); Class 3 obesity in adult; Transient cerebral ischemia; Lumbar spondylosis; Chronic pain syndrome; Chronic upper back pain; Chest pain; Atypical chest pain; T wave inversion in EKG; Class 3 severe obesity due to excess calories with serious comorbidity and body mass index (BMI) of 45.0 to 49.9 in adult [facility]); Moderate persistent asthma; Chronic migraine with aura; Periorbital cellulitis of right eye; SI joint arthritis; Myofascial pain; and Trochanteric bursitis of left hip on their problem list. Her primarily concern today is the Back Pain (lower, mid) and Hip Pain (bilateral, left is worse)  Pain Assessment: Location: Lower, Mid Back Radiating: to hips, left is worse Onset: More than a month ago Duration: Chronic pain Quality: Aching(sensitive to touch) Severity: 8 /10 (subjective, self-reported pain score)  Note: Reported level is inconsistent with clinical observations.                         When using our objective Pain Scale, levels between 6 and 10/10 are said to belong in an emergency room, as it progressively worsens from a 6/10, described as severely limiting, requiring emergency care not usually available at an outpatient pain management facility. At a 6/10 level, communication becomes difficult and requires great effort. Assistance to reach the emergency department may be required. Facial flushing and profuse sweating along with potentially dangerous increases in heart rate and blood pressure will be evident. Effect on ADL: limits daily activities Timing: Constant Modifying factors: meds, procedure, ice BP: 127/86  HR: 64  [name] [name] was last seen on 08/26/2018  for a procedure. During today's appointment we reviewed [name] [name]'s post-procedure results, as well as her outpatient medication regimen.  Further details on both, my assessment(s), as well as the proposed treatment  plan, please see below.  Controlled Substance Pharmacotherapy Assessment REMS (Risk Evaluation and Mitigation Strategy)  Analgesic: Oxycodone 5 mg daily PRN, quantity 30/month, MME equals 7.5             [name] Patience, RN  10/23/2018 10:01 AM  Signed Nursing Pain Medication Assessment:  Safety precautions to be maintained throughout the outpatient stay will include: orient to surroundings, keep bed in low position, maintain call bell within reach at all times, provide assistance with transfer out of bed and ambulation.  Medication Inspection Compliance: [name] [name] did not comply with our request to bring her pills to be counted. She was reminded that bringing the medication bottles, even when empty, is a requirement.  Medication: None brought in. Pill/Patch Count: None available to be counted. Bottle Appearance: No container available. Did not bring bottle(s) to appointment. Filled Date: N/A Last Medication intake:  Yesterday   Pharmacokinetics: Liberation and absorption (onset of action): WNL Distribution (time to peak effect): WNL Metabolism and excretion (duration of action): WNL         Pharmacodynamics: Desired effects: Analgesia: [name] [name] reports >50% benefit. Functional ability: Patient reports that medication allows her to accomplish basic ADLs Clinically meaningful improvement in function (CMIF): Sustained CMIF goals met Perceived effectiveness: Described as relatively effective, allowing for increase in activities of daily living (ADL) Undesirable effects: Side-effects or Adverse reactions: None reported Monitoring: [place] PMP: Online review of the past 44-monthperiod conducted. Compliant with practice rules and regulations Last UDS on record: Summary  Date  Value Ref Range Status  08/04/2018 FINAL  Final    Comment:    ==================================================================== TOXASSURE SELECT 13 (MW) ==================================================================== Test                             Result       Flag       Units Drug Present not Declared for Prescription Verification   7-aminoclonazepam              50           UNEXPECTED ng/mg creat    7-aminoclonazepam is an expected metabolite of clonazepam. Source    of clonazepam is a scheduled prescription medication.   Oxycodone                      116          UNEXPECTED ng/mg creat   Noroxycodone                   874          UNEXPECTED ng/mg creat    Sources of oxycodone include scheduled prescription medications.    Noroxycodone is an expected metabolite of oxycodone. Drug Absent but Declared for Prescription Verification   Diazepam                       Not Detected UNEXPECTED ng/mg creat ==================================================================== Test                      Result    Flag   Units      Ref Range   Creatinine              133              mg/dL      >=20 ==================================================================== Declared Medications:  The flagging and interpretation on this  report are based on the  following declared medications.  Unexpected results may arise from  inaccuracies in the declared medications.  **Note: The testing scope of this panel includes these medications:  Diazepam (Valium)  **Note: The testing scope of this panel does not include following  reported medications:  Aspirin (Aspirin 81)  Atenolol (Tenormin)  Cholecalciferol  Estradiol (Estrace)  Furosemide (Lasix)  Ibuprofen  Levothyroxine (Synthroid)  LIOTHYRONINE (Cytomel)  Melatonin  Modafinil (Provigil)  Multivitamin (MVI)  Polyethylene Glycol (MiraLAX)  Potassium (K-Dur)  Pramipexole (Mirapex)  Promethazine (Phenergan)  Topical  Zolpidem  (Ambien) ==================================================================== For clinical consultation, please call [phone]. ====================================================================    UDS interpretation: Compliant          Medication Assessment Form: Reviewed. Patient indicates being compliant with therapy Treatment compliance: Compliant Risk Assessment Profile: Aberrant behavior: See initial evaluations. None observed or detected today Comorbid factors increasing risk of overdose: See initial evaluation. No additional risks detected today Opioid risk tool (ORT):  Opioid Risk  10/23/2018  Alcohol 0  Illegal Drugs 0  Rx Drugs 0  Alcohol 0  Illegal Drugs 0  Rx Drugs 0  Age between 16-45 years  0  History of Preadolescent Sexual Abuse 0  Psychological Disease 0  ADD -  OCD -  Bipolar -  Depression 0  Opioid Risk Tool Scoring 0  Opioid Risk Interpretation Low Risk    ORT Scoring interpretation table:  Score <3 = Low Risk for SUD  Score between 4-7 = Moderate Risk for SUD  Score >8 = High Risk for Opioid Abuse   Risk of substance use disorder (SUD): Low  Risk Mitigation Strategies:  Patient Counseling: Covered Patient-Prescriber Agreement (PPA): Present and active  Notification to other healthcare providers: Done  Pharmacologic Plan: No change in therapy, at this time.             Post-Procedure Assessment  08/26/2018 Procedure: Bilateral SI joint injection Influential Factors: BMI: 49.68 kg/m Intra-procedural challenges: None observed.         Assessment challenges: None detected.              Reported side-effects: None.        Post-procedural adverse reactions or complications: None reported         Sedation: Please see nurses note. When no sedatives are used, the analgesic levels obtained are directly associated to the effectiveness of the local anesthetics. However, when sedation is provided, the level of analgesia obtained during the initial  1 hour following the intervention, is believed to be the result of a combination of factors. These factors may include, but are not limited to: 1. The effectiveness of the local anesthetics used. 2. The effects of the analgesic(s) and/or anxiolytic(s) used. 3. The degree of discomfort experienced by the patient at the time of the procedure. 4. The patients ability and reliability in recalling and recording the events. 5. The presence and influence of possible secondary gains and/or psychosocial factors. Reported result: Relief experienced during the 1st hour after the procedure: 100 % (Ultra-Short Term Relief)            Interpretative annotation: Clinically appropriate result. Analgesia during this period is likely to be Local Anesthetic and/or IV Sedative (Analgesic/Anxiolytic) related.          Effects of local anesthetic: The analgesic effects attained during this period are directly associated to the localized infiltration of local anesthetics and therefore cary significant diagnostic value as to the etiological location, or anatomical origin,  of the pain. Expected duration of relief is directly dependent on the pharmacodynamics of the local anesthetic used. Long-acting (4-6 hours) anesthetics used.  Reported result: Relief during the next 4 to 6 hour after the procedure: 100 % (Short-Term Relief)            Interpretative annotation: Clinically appropriate result. Analgesia during this period is likely to be Local Anesthetic-related.          Long-term benefit: Defined as the period of time past the expected duration of local anesthetics (1 hour for short-acting and 4-6 hours for long-acting). With the possible exception of prolonged sympathetic blockade from the local anesthetics, benefits during this period are typically attributed to, or associated with, other factors such as analgesic sensory neuropraxia, antiinflammatory effects, or beneficial biochemical changes provided by agents other than  the local anesthetics.  Reported result: Extended relief following procedure: 100 %(X 4 weeks; pt states she continues to experience benefit from procedure but that pain is gradually returning.) (Long-Term Relief)            Interpretative annotation: Clinically possible results. Good relief. No permanent benefit expected. Inflammation plays a part in the etiology to the pain.          Current benefits: Defined as reported results that persistent at this point in time.   Analgesia: 50 %            Function: Somewhat improved ROM: Somewhat improved Interpretative annotation: Recurrence of symptoms. No permanent benefit expected. Effective therapeutic approach.          Interpretation: Results would suggest a successful diagnostic and therapeutic intervention.                  Plan:  Repeat treatment or therapy and compare extent and duration of benefits.                Laboratory Chemistry  Inflammation Markers (CRP: Acute Phase) (ESR: Chronic Phase) Lab Results  Component Value Date   LATICACIDVEN 0.54 09/05/2014                         Rheumatology Markers No results found for: RF, ANA, LABURIC, URICUR, LYMEIGGIGMAB, LYMEABIGMQN, HLAB27                      Renal Function Markers Lab Results  Component Value Date   BUN 10 10/10/2018   CREATININE 0.76 10/10/2018   BCR 14 11/13/2017   GFRAA >60 10/10/2018   GFRNONAA >60 10/10/2018                             Hepatic Function Markers Lab Results  Component Value Date   AST 12 (L) 10/08/2018   ALT 10 10/08/2018   ALBUMIN 3.3 (L) 10/08/2018   ALKPHOS 71 10/08/2018   AMYLASE 54 11/18/2012   LIPASE 11 11/23/2016                        Electrolytes Lab Results  Component Value Date   NA 139 10/10/2018   K 3.7 10/10/2018   CL 112 (H) 10/10/2018   CALCIUM 8.5 (L) 10/10/2018   MG 1.8 09/26/2014                        Neuropathy Markers Lab Results  Component Value Date   AGTXMIWO03 212  09/10/2016   FOLATE >20.0  09/10/2016   HGBA1C 5.5 10/09/2016   HIV Non Reactive 11/19/2017                        CNS Tests No results found for: COLORCSF, APPEARCSF, RBCCOUNTCSF, WBCCSF, POLYSCSF, LYMPHSCSF, EOSCSF, PROTEINCSF, GLUCCSF, JCVIRUS, CSFOLI, IGGCSF                      Bone Pathology Markers Lab Results  Component Value Date   VD25OH 23.5 (L) 10/09/2016   [id number] 47 03/09/2015   [id number] 24 03/09/2015   [id number] 23 03/09/2015                         Coagulation Parameters Lab Results  Component Value Date   INR 1.04 02/05/2016   LABPROT 13.8 02/05/2016   APTT 35 02/05/2016   PLT 166 10/08/2018   DDIMER 0.51 (H) 09/20/2018   LABHEMA Note: 01/18/2016                        Cardiovascular Markers Lab Results  Component Value Date   BNP 30.9 10/01/2014   CKTOTAL 101 05/29/2007   CKMB 1.2 05/29/2007   TROPONINI <0.03 11/20/2017   HGB 12.2 10/08/2018   HCT 39.5 10/08/2018                         CA Markers No results found for: CEA, CA125, LABCA2                      Endocrine Markers Lab Results  Component Value Date   TSH 1.970 10/09/2016   FREET4 1.01 09/10/2016                        Note: Lab results reviewed.  Recent Diagnostic Imaging Results  CT Maxillofacial W Contrast CLINICAL DATA:  Facial cellulitis. Right eye swelling, redness, and pain beginning yesterday that has spread through the right face.  EXAM: CT MAXILLOFACIAL WITH CONTRAST  TECHNIQUE: Multidetector CT imaging of the maxillofacial structures was performed with intravenous contrast. Multiplanar CT image reconstructions were also generated.  CONTRAST:  55m ISOVUE-300 IOPAMIDOL (ISOVUE-300) INJECTION 61%  COMPARISON:  Head CT 11/27/2016. Brain MRI 02/05/2016. No prior maxillofacial imaging.  FINDINGS: Osseous: No fracture, destructive osseous process, or mandibular dislocation.  Orbits: Mild-to-moderate right periorbital soft tissue swelling with mild extension in the subcutaneous  tissues inferior to the orbit. No fluid collection or evidence of postseptal inflammation. Symmetric and grossly intact globes.  Sinuses: Trace left maxillary sinus mucosal thickening. No fluid.  Soft tissues: No additional findings.  Limited intracranial: Unremarkable.  IMPRESSION: Right periorbital swelling compatible with cellulitis. No abscess or postseptal inflammation.  Electronically Signed   By: ALogan BoresM.D.   On: 10/07/2018 12:26  Complexity Note: Imaging results reviewed. Results shared with Ms. Thalman, using Layman's terms.                         Meds   Current Outpatient Medications:  .  AJOVY 225 MG/1.5ML SOSY, INJECT CONTENTS OF 1 SYRINGE (225 MG) INTO THE SKIN EVERY 30 DAYS (Patient taking differently: Inject 225 mg into the skin every 30 (thirty) days. ), Disp: 1.5 Syringe, Rfl: 7 .  albuterol (PROVENTIL HFA;VENTOLIN HFA) 108 (90 BASE) MCG/ACT inhaler, Inhale 2  puffs into the lungs every 6 (six) hours as needed for wheezing or shortness of breath. , Disp: , Rfl:  .  albuterol (PROVENTIL) (2.5 MG/3ML) 0.083% nebulizer solution, Take 2.5 mg by nebulization every 6 (six) hours as needed for wheezing or shortness of breath., Disp: , Rfl:  .  ARIPiprazole (ABILIFY) 5 MG tablet, Take 5 mg by mouth every other day., Disp: , Rfl:  .  bacitracin-polymyxin b (POLYSPORIN) ophthalmic ointment, Place into the right eye 3 (three) times daily. apply to the right eye while awake, Disp: 3.5 g, Rfl: 3 .  cetirizine (ZYRTEC) 10 MG tablet, Take 10 mg by mouth daily as needed for allergies. , Disp: , Rfl: 1 .  clonazePAM (KLONOPIN) 1 MG tablet, TAKE 1 TABLET BY MOUTH AT NIGHT AS NEEDED, USE AFTER FACETTE INJECTIONS (Patient taking differently: Take 1 mg by mouth at bedtime. AS NEEDED, USE AFTER FACETTE INJECTIONS), Disp: 90 tablet, Rfl: 0 .  diclofenac sodium (VOLTAREN) 1 % GEL, Apply 2 g topically as needed for pain. knee, Disp: , Rfl:  .  estradiol (ESTRACE) 1 MG tablet, TAKE 28m   TABLET BY MOUTH EVERY DAY (Patient taking differently: Take 1 mg by mouth daily. ), Disp: 90 tablet, Rfl: 0 .  FLUoxetine (PROZAC) 40 MG capsule, Take 1 capsule (40 mg total) by mouth daily., Disp: 90 capsule, Rfl: 1 .  fluticasone (FLONASE) 50 MCG/ACT nasal spray, Place 1 spray into the nose daily., Disp: , Rfl:  .  gabapentin (NEURONTIN) 300 MG capsule, TAKE 1 CAPSULE (300 MG TOTAL) AT BEDTIME BY MOUTH. (Patient taking differently: Take 300 mg by mouth daily. ), Disp: 90 capsule, Rfl: 3 .  hydrocortisone (ANUSOL-HC) 2.5 % rectal cream, Apply 1 application topically 4 (four) times daily as needed for hemorrhoids., Disp: 30 g, Rfl: 0 .  hyoscyamine (LEVSIN SL) 0.125 MG SL tablet, Take 0.125 mg by mouth daily as needed for cramping. , Disp: , Rfl:  .  lamoTRIgine (LAMICTAL) 100 MG tablet, Take 200-300 mg by mouth 2 (two) times daily. 300 mg in the morning and 200 mg at night, Disp: , Rfl:  .  lamoTRIgine (LAMICTAL) 150 MG tablet, TAKE 1 TABLET (150 MG TOTAL) 2 (TWO) TIMES DAILY BY MOUTH., Disp: 60 tablet, Rfl: 0 .  meclizine (ANTIVERT) 25 MG tablet, Take 25 mg by mouth daily as needed for dizziness. , Disp: , Rfl:  .  metoprolol tartrate (LOPRESSOR) 25 MG tablet, Take 25 mg by mouth daily. , Disp: , Rfl:  .  omeprazole (PRILOSEC) 40 MG capsule, Take 40 mg by mouth daily., Disp: , Rfl:  .  [START ON 11/12/2018] oxyCODONE (OXY IR/ROXICODONE) 5 MG immediate release tablet, Take 1 tablet (5 mg total) by mouth daily as needed for severe pain. Rx to last for 90 days., Disp: 90 tablet, Rfl: 0 .  promethazine (PHENERGAN) 25 MG tablet, Take 25 mg by mouth every 12 (twelve) hours as needed for nausea. , Disp: , Rfl:  .  sucralfate (CARAFATE) 1 g tablet, Take 1 g by mouth daily as needed. , Disp: , Rfl:  .  SUMAtriptan (IMITREX) 50 MG tablet, Take 1 tablet (50 mg total) by mouth every 2 (two) hours as needed for migraine. May repeat in 2 hours if headache persists or recurs. Do not take more then 2 in one day or 12  in 1 month., Disp: 12 tablet, Rfl: 1 .  tiZANidine (ZANAFLEX) 4 MG tablet, Take 1 tablet (4 mg total) by mouth at bedtime., Disp:  90 tablet, Rfl: 3 .  Vitamin D, Ergocalciferol, (DRISDOL) 50000 units CAPS capsule, Take 1 capsule (50,000 Units total) by mouth every Tuesday., Disp: 30 capsule, Rfl: 0 .  diclofenac (FLECTOR) 1.3 % PTCH, Place 1 patch onto the skin 2 (two) times daily as needed., Disp: 30 patch, Rfl: 1  ROS  Constitutional: Denies any fever or chills Gastrointestinal: No reported hemesis, hematochezia, vomiting, or acute GI distress Musculoskeletal: Denies any acute onset joint swelling, redness, loss of ROM, or weakness Neurological: No reported episodes of acute onset apraxia, aphasia, dysarthria, agnosia, amnesia, paralysis, loss of coordination, or loss of consciousness  Allergies  Ms. Roark is allergic to amoxicillin; penicillins; biaxin [clarithromycin]; erythromycin base; and zithromax [azithromycin].  PFSH  Drug: Ms. Laton  reports no history of drug use. Alcohol:  reports no history of alcohol use. Tobacco:  reports that she has never smoked. She has never used smokeless tobacco. Medical:  has a past medical history of Anginal pain ([place]), Anxiety, Asthma, Atrial fibrillation ([place]), Chest pain, Chronic back pain, Chronic bronchitis ([place]), Complication of anesthesia, Daily headache, Fibroid, GERD (gastroesophageal reflux disease), Head injury (1975; 2015), Joint pain, Migraine, Obesity, Class III, BMI 40-49.9 (morbid obesity) ([place]) (12/21/2012), Ovarian cyst, Pre-diabetes, Seizures ([place]), Shortness of breath dyspnea, TBI (traumatic brain injury) ([place]) (2015), TIA (transient ischemic attack) (04/2015?), and Vitamin D deficiency. Surgical: Ms. Stegmann  has a past surgical history that includes Cesarean section (1989); Esophagogastroduodenoscopy (N/A, 11/21/2012); Colonoscopy (N/A, 11/21/2012); Incisional hernia repair (N/A, 12/17/2012); Insertion of mesh (N/A, 12/17/2012); Cardiac  catheterization (N/A, 01/21/2015); Appendectomy (1990s); Hernia repair; Dilation and curettage of uterus (1985); Ovarian cyst removal (2007; 2014); Abdominal hysterectomy (2005); Colonoscopy with propofol (N/A, 09/02/2017); and Esophagogastroduodenoscopy (egd) with propofol (N/A, 09/02/2017). Family: family history includes Breast cancer in her cousin; COPD in her father; Depression in her mother; Diabetes in her father; Emphysema in her father; Heart attack in her father; Heart disease in her father and mother; Hypertension in her father and mother.  Constitutional Exam  General appearance: Well nourished, well developed, and well hydrated. In no apparent acute distress Vitals:   10/23/18 1003  BP: 127/86  Pulse: 64  Resp: 16  Temp: 98.2 F (36.8 C)  TempSrc: Oral  SpO2: 100%  Weight: 276 lb (125.2 kg)  Height: 5' 2.5" (1.588 m)   BMI Assessment: Estimated body mass index is 49.68 kg/m as calculated from the following:   Height as of this encounter: 5' 2.5" (1.588 m).   Weight as of this encounter: 276 lb (125.2 kg).  BMI interpretation table: BMI level Category Range association with higher incidence of chronic pain  <18 kg/m2 Underweight   18.5-24.9 kg/m2 Ideal body weight   25-29.9 kg/m2 Overweight Increased incidence by 20%  30-34.9 kg/m2 Obese (Class I) Increased incidence by 68%  35-39.9 kg/m2 Severe obesity (Class II) Increased incidence by 136%  >40 kg/m2 Extreme obesity (Class III) Increased incidence by 254%   Patient's current BMI Ideal Body weight  Body mass index is 49.68 kg/m. Ideal body weight: 51.3 kg (112 lb 15.8 oz) Adjusted ideal body weight: 80.8 kg (178 lb 3.1 oz)   BMI Readings from Last 4 Encounters:  10/23/18 49.68 kg/m  10/10/18 52.26 kg/m  10/06/18 50.04 kg/m  08/11/18 48.96 kg/m   Wt Readings from Last 4 Encounters:  10/23/18 276 lb (125.2 kg)  10/10/18 285 lb 11.5 oz (129.6 kg)  10/06/18 278 lb (126.1 kg)  08/11/18 272 lb (123.4 kg)   Psych/Mental status: Alert, oriented x 3 (  person, place, & time)       Eyes: PERLA Respiratory: No evidence of acute respiratory distress  Cervical Spine Area Exam  Skin & Axial Inspection: No masses, redness, edema, swelling, or associated skin lesions Alignment: Symmetrical Functional ROM: Unrestricted ROM      Stability: No instability detected Muscle Tone/Strength: Functionally intact. No obvious neuro-muscular anomalies detected. Sensory (Neurological): Unimpaired Palpation: No palpable anomalies              Upper Extremity (UE) Exam    Side: Right upper extremity  Side: Left upper extremity  Skin & Extremity Inspection: Skin color, temperature, and hair growth are WNL. No peripheral edema or cyanosis. No masses, redness, swelling, asymmetry, or associated skin lesions. No contractures.  Skin & Extremity Inspection: Skin color, temperature, and hair growth are WNL. No peripheral edema or cyanosis. No masses, redness, swelling, asymmetry, or associated skin lesions. No contractures.  Functional ROM: Unrestricted ROM          Functional ROM: Unrestricted ROM          Muscle Tone/Strength: Functionally intact. No obvious neuro-muscular anomalies detected.  Muscle Tone/Strength: Functionally intact. No obvious neuro-muscular anomalies detected.  Sensory (Neurological): Unimpaired          Sensory (Neurological): Unimpaired          Palpation: No palpable anomalies              Palpation: No palpable anomalies              Provocative Test(s):  Phalen's test: deferred Tinel's test: deferred Apley's scratch test (touch opposite shoulder):  Action 1 (Across chest): deferred Action 2 (Overhead): deferred Action 3 (LB reach): deferred   Provocative Test(s):  Phalen's test: deferred Tinel's test: deferred Apley's scratch test (touch opposite shoulder):  Action 1 (Across chest): deferred Action 2 (Overhead): deferred Action 3 (LB reach): deferred    Thoracic Spine Area Exam  Skin &  Axial Inspection: No masses, redness, or swelling Alignment: Symmetrical Functional ROM: Decreased ROM Stability: No instability detected Muscle Tone/Strength: Functionally intact. No obvious neuro-muscular anomalies detected. Sensory (Neurological): Unimpaired Muscle strength & Tone: No palpable anomalies  Lumbar Spine Area Exam  Skin & Axial Inspection: No masses, redness, or swelling Alignment: Symmetrical Functional ROM: Decreased ROM       Stability: No instability detected Muscle Tone/Strength: Functionally intact. No obvious neuro-muscular anomalies detected. Sensory (Neurological): Musculoskeletal pain pattern Palpation: Tender Right Fist Percussion Test Provocative Tests: Hyperextension/rotation test: (+) due to pain. Lumbar quadrant test (Kemp's test): (+) due to pain. Lateral bending test: deferred today       Patrick's Maneuver: (+) for bilateral S-I arthralgia             FABER* test: (+) for bilateral S-I arthralgia             S-I anterior distraction/compression test: (+)   S-I arthralgia/arthropathy S-I lateral compression test: (+) bilateral       S-I Thigh-thrust test: deferred today         S-I Gaenslen's test: deferred today         *(Flexion, ABduction and External Rotation)  Gait & Posture Assessment  Ambulation: Limited Gait: Antalgic Posture: Difficulty standing up straight, due to pain   Lower Extremity Exam    Side: Right lower extremity  Side: Left lower extremity  Stability: No instability observed          Stability: No instability observed  Skin & Extremity Inspection: Skin color, temperature, and hair growth are WNL. No peripheral edema or cyanosis. No masses, redness, swelling, asymmetry, or associated skin lesions. No contractures.  Skin & Extremity Inspection: Skin color, temperature, and hair growth are WNL. No peripheral edema or cyanosis. No masses, redness, swelling, asymmetry, or associated skin lesions. No contractures.  Functional  ROM: Unrestricted ROM                  Functional ROM: Unrestricted ROM                  Muscle Tone/Strength: Functionally intact. No obvious neuro-muscular anomalies detected.  Muscle Tone/Strength: Functionally intact. No obvious neuro-muscular anomalies detected.  Sensory (Neurological): Unimpaired        Sensory (Neurological): Unimpaired        DTR: Patellar: deferred today Achilles: deferred today Plantar: deferred today  DTR: Patellar: deferred today Achilles: deferred today Plantar: deferred today  Palpation: No palpable anomalies  Palpation: No palpable anomalies   Assessment   Status Diagnosis  Controlled Controlled Controlled 1. SI joint arthritis   2. Myofascial pain   3. Trochanteric bursitis of left hip   4. Chronic pain syndrome   5. Back pain at L4-L5 level   6. Lumbar spondylosis   7. Lumbar facet arthropathy      Updated Problems: Problem  Si Joint Arthritis  Myofascial Pain  Trochanteric Bursitis of Left Hip    Plan of Care  Pharmacotherapy (Medications Ordered): Meds ordered this encounter  Medications  . oxyCODONE (OXY IR/ROXICODONE) 5 MG immediate release tablet    Sig: Take 1 tablet (5 mg total) by mouth daily as needed for severe pain. Rx to last for 90 days.    Dispense:  90 tablet    Refill:  0    Do not place this medication, or any other prescription from our practice, on "Automatic Refill". Patient may have prescription filled one day early if pharmacy is closed on scheduled refill date.  . diclofenac (FLECTOR) 1.3 % PTCH    Sig: Place 1 patch onto the skin 2 (two) times daily as needed.    Dispense:  30 patch    Refill:  1  . tiZANidine (ZANAFLEX) 4 MG tablet    Sig: Take 1 tablet (4 mg total) by mouth at bedtime.    Dispense:  90 tablet    Refill:  3   Lab-work, procedure(s), and/or referral(s): Orders Placed This Encounter  Procedures  . SACROILIAC JOINT INJECTION  . TRIGGER POINT INJECTION   Provider-requested follow-up:  Return in about 2 weeks (around 11/06/2018) for Procedure.  Future Appointments  Date Time Provider [place]  11/10/2018 10:00 AM [name] Santa, MD [facility] None  01/22/2019  9:30 AM [name] Santa, MD [facility] None  02/24/2019  8:00 AM Ward Givens, NP [facility] None    Primary Care Physician: [name] Pink, MD Location: [facility] Note by: [name] [name], M.D Date: 10/23/2018; Time: 1:21 PM  Patient Instructions  Prescriptions for Tizanidine (3 refills), Flector patch, (1 refill), and Oxycodone (to last 3 months) were sent to your pharmacy.  ____________________________________________________________________________________________  Preparing for Procedure with Sedation  Instructions: . Oral Intake: Do not eat or drink anything for at least 8 hours prior to your procedure. . Transportation: Public transportation is not allowed. Bring an adult driver. The driver must be physically present in our waiting room before any procedure can be started. Marland Kitchen Physical Assistance: Bring an adult physically capable of  assisting you, in the event you need help. This adult should keep you company at home for at least 6 hours after the procedure. . Blood Pressure Medicine: Take your blood pressure medicine with a sip of water the morning of the procedure. . Blood thinners: Notify our staff if you are taking any blood thinners. Depending on which one you take, there will be specific instructions on how and when to stop it. . Diabetics on insulin: Notify the staff so that you can be scheduled 1st case in the morning. If your diabetes requires high dose insulin, take only  of your normal insulin dose the morning of the procedure and notify the staff that you have done so. . Preventing infections: Shower with an antibacterial soap the morning of your procedure. . Build-up your immune system: Take 1000 mg of Vitamin C with every meal (3 times a day) the day prior to your  procedure. Marland Kitchen Antibiotics: Inform the staff if you have a condition or reason that requires you to take antibiotics before dental procedures. . Pregnancy: If you are pregnant, call and cancel the procedure. . Sickness: If you have a cold, fever, or any active infections, call and cancel the procedure. . Arrival: You must be in the facility at least 30 minutes prior to your scheduled procedure. . Children: Do not bring children with you. . Dress appropriately: Bring dark clothing that you would not mind if they get stained. . Valuables: Do not bring any jewelry or valuables.  Procedure appointments are reserved for interventional treatments only. Marland Kitchen No Prescription Refills. . No medication changes will be discussed during procedure appointments. . No disability issues will be discussed.  Reasons to call and reschedule or cancel your procedure: (Following these recommendations will minimize the risk of a serious complication.) . Surgeries: Avoid having procedures within 2 weeks of any surgery. (Avoid for 2 weeks before or after any surgery). . Flu Shots: Avoid having procedures within 2 weeks of a flu shots or . (Avoid for 2 weeks before or after immunizations). . Barium: Avoid having a procedure within 7-10 days after having had a radiological study involving the use of radiological contrast. (Myelograms, Barium swallow or enema study). . Heart attacks: Avoid any elective procedures or surgeries for the initial 6 months after a "Myocardial Infarction" (Heart Attack). . Blood thinners: It is imperative that you stop these medications before procedures. Let us know if you if you take any blood thinner.  . Infection: Avoid procedures during or within two weeks of an infection (including chest colds or gastrointestinal problems). Symptoms associated with infections include: Localized redness, fever, chills, night sweats or profuse sweating, burning sensation when voiding, cough, congestion, stuffiness,  runny nose, sore throat, diarrhea, nausea, vomiting, cold or Flu symptoms, recent or current infections. It is specially important if the infection is over the area that we intend to treat. Marland Kitchen Heart and lung problems: Symptoms that may suggest an active cardiopulmonary problem include: cough, chest pain, breathing difficulties or shortness of breath, dizziness, ankle swelling, uncontrolled high or unusually low blood pressure, and/or palpitations. If you are experiencing any of these symptoms, cancel your procedure and contact your primary care physician for an evaluation.  Remember:  Regular Business hours are:  Monday to Thursday 8:00 AM to 4:00 PM  Provider's Schedule: Milinda Pointer, MD:  Procedure days: Tuesday and Thursday 7:30 AM to 4:00 PM  [name] Santa, MD:  Procedure days: Monday and Wednesday 7:30 AM to 4:00 PM ____________________________________________________________________________________________

## 2018-10-23 NOTE — Patient Instructions (Addendum)
Prescriptions for Tizanidine (3 refills), Flector patch, (1 refill), and Oxycodone (to last 3 months) were sent to your pharmacy.  ____________________________________________________________________________________________  Preparing for Procedure with Sedation  Instructions: . Oral Intake: Do not eat or drink anything for at least 8 hours prior to your procedure. . Transportation: Public transportation is not allowed. Bring an adult driver. The driver must be physically present in our waiting room before any procedure can be started. Marland Kitchen Physical Assistance: Bring an adult physically capable of assisting you, in the event you need help. This adult should keep you company at home for at least 6 hours after the procedure. . Blood Pressure Medicine: Take your blood pressure medicine with a sip of water the morning of the procedure. . Blood thinners: Notify our staff if you are taking any blood thinners. Depending on which one you take, there will be specific instructions on how and when to stop it. . Diabetics on insulin: Notify the staff so that you can be scheduled 1st case in the morning. If your diabetes requires high dose insulin, take only  of your normal insulin dose the morning of the procedure and notify the staff that you have done so. . Preventing infections: Shower with an antibacterial soap the morning of your procedure. . Build-up your immune system: Take 1000 mg of Vitamin C with every meal (3 times a day) the day prior to your procedure. Marland Kitchen Antibiotics: Inform the staff if you have a condition or reason that requires you to take antibiotics before dental procedures. . Pregnancy: If you are pregnant, call and cancel the procedure. . Sickness: If you have a cold, fever, or any active infections, call and cancel the procedure. . Arrival: You must be in the facility at least 30 minutes prior to your scheduled procedure. . Children: Do not bring children with you. . Dress appropriately:  Bring dark clothing that you would not mind if they get stained. . Valuables: Do not bring any jewelry or valuables.  Procedure appointments are reserved for interventional treatments only. Marland Kitchen No Prescription Refills. . No medication changes will be discussed during procedure appointments. . No disability issues will be discussed.  Reasons to call and reschedule or cancel your procedure: (Following these recommendations will minimize the risk of a serious complication.) . Surgeries: Avoid having procedures within 2 weeks of any surgery. (Avoid for 2 weeks before or after any surgery). . Flu Shots: Avoid having procedures within 2 weeks of a flu shots or . (Avoid for 2 weeks before or after immunizations). . Barium: Avoid having a procedure within 7-10 days after having had a radiological study involving the use of radiological contrast. (Myelograms, Barium swallow or enema study). . Heart attacks: Avoid any elective procedures or surgeries for the initial 6 months after a "Myocardial Infarction" (Heart Attack). . Blood thinners: It is imperative that you stop these medications before procedures. Let us know if you if you take any blood thinner.  . Infection: Avoid procedures during or within two weeks of an infection (including chest colds or gastrointestinal problems). Symptoms associated with infections include: Localized redness, fever, chills, night sweats or profuse sweating, burning sensation when voiding, cough, congestion, stuffiness, runny nose, sore throat, diarrhea, nausea, vomiting, cold or Flu symptoms, recent or current infections. It is specially important if the infection is over the area that we intend to treat. Marland Kitchen Heart and lung problems: Symptoms that may suggest an active cardiopulmonary problem include: cough, chest pain, breathing difficulties or shortness of breath,  dizziness, ankle swelling, uncontrolled high or unusually low blood pressure, and/or palpitations. If you are  experiencing any of these symptoms, cancel your procedure and contact your primary care physician for an evaluation.  Remember:  Regular Business hours are:  Monday to Thursday 8:00 AM to 4:00 PM  Provider's Schedule: Milinda Pointer, MD:  Procedure days: Tuesday and Thursday 7:30 AM to 4:00 PM  [name], MD:  Procedure days: Monday and Wednesday 7:30 AM to 4:00 PM ____________________________________________________________________________________________

## 2018-10-30 ENCOUNTER — Encounter: Payer: Self-pay | Admitting: Student in an Organized Health Care Education/Training Program

## 2018-10-30 ENCOUNTER — Other Ambulatory Visit: Payer: Self-pay | Admitting: Student

## 2018-10-30 DIAGNOSIS — R109 Unspecified abdominal pain: Principal | ICD-10-CM

## 2018-10-30 DIAGNOSIS — K529 Noninfective gastroenteritis and colitis, unspecified: Secondary | ICD-10-CM

## 2018-10-30 DIAGNOSIS — G8929 Other chronic pain: Secondary | ICD-10-CM

## 2018-10-30 DIAGNOSIS — K760 Fatty (change of) liver, not elsewhere classified: Secondary | ICD-10-CM

## 2018-11-03 ENCOUNTER — Other Ambulatory Visit: Payer: Self-pay | Admitting: Obstetrics and Gynecology

## 2018-11-05 ENCOUNTER — Encounter (HOSPITAL_COMMUNITY): Payer: Self-pay

## 2018-11-05 ENCOUNTER — Ambulatory Visit (HOSPITAL_COMMUNITY): Payer: Self-pay

## 2018-11-07 ENCOUNTER — Other Ambulatory Visit: Payer: Self-pay

## 2018-11-07 ENCOUNTER — Ambulatory Visit (INDEPENDENT_AMBULATORY_CARE_PROVIDER_SITE_OTHER): Payer: BC Managed Care – PPO | Admitting: Licensed Clinical Social Worker

## 2018-11-07 DIAGNOSIS — F4312 Post-traumatic stress disorder, chronic: Secondary | ICD-10-CM | POA: Diagnosis not present

## 2018-11-10 ENCOUNTER — Ambulatory Visit (HOSPITAL_BASED_OUTPATIENT_CLINIC_OR_DEPARTMENT_OTHER): Payer: BC Managed Care – PPO | Admitting: Student in an Organized Health Care Education/Training Program

## 2018-11-10 ENCOUNTER — Encounter: Payer: Self-pay | Admitting: Student in an Organized Health Care Education/Training Program

## 2018-11-10 ENCOUNTER — Ambulatory Visit
Admission: RE | Admit: 2018-11-10 | Discharge: 2018-11-10 | Disposition: A | Discharge status: Home / Self Care | Payer: BC Managed Care – PPO | Source: Ambulatory Visit | Attending: Student in an Organized Health Care Education/Training Program | Admitting: Student in an Organized Health Care Education/Training Program

## 2018-11-10 ENCOUNTER — Other Ambulatory Visit: Payer: Self-pay

## 2018-11-10 VITALS — BP 137/53 | HR 68 | Temp 98.2°F | Resp 16 | Ht 62.5 in | Wt 276.0 lb

## 2018-11-10 DIAGNOSIS — M7918 Myalgia, other site: Secondary | ICD-10-CM | POA: Insufficient documentation

## 2018-11-10 DIAGNOSIS — M47818 Spondylosis without myelopathy or radiculopathy, sacral and sacrococcygeal region: Secondary | ICD-10-CM

## 2018-11-10 DIAGNOSIS — G894 Chronic pain syndrome: Secondary | ICD-10-CM | POA: Insufficient documentation

## 2018-11-10 MED ORDER — LIDOCAINE HCL 2 % IJ SOLN
INTRAMUSCULAR | Status: AC
  Filled 2018-11-10: qty 20

## 2018-11-10 MED ORDER — LACTATED RINGERS IV SOLN
1000.0000 mL | Freq: Once | INTRAVENOUS | Status: AC
  Administered 2018-11-10: 1000 mL via INTRAVENOUS

## 2018-11-10 MED ORDER — ROPIVACAINE HCL 2 MG/ML IJ SOLN
INTRAMUSCULAR | Status: AC
  Filled 2018-11-10: qty 10

## 2018-11-10 MED ORDER — DEXAMETHASONE SODIUM PHOSPHATE 10 MG/ML IJ SOLN
INTRAMUSCULAR | Status: AC
  Filled 2018-11-10: qty 3

## 2018-11-10 MED ORDER — FENTANYL CITRATE (PF) 100 MCG/2ML IJ SOLN
25.0000 ug | INTRAMUSCULAR | Status: DC | PRN
  Administered 2018-11-10: 100 ug via INTRAVENOUS

## 2018-11-10 MED ORDER — DEXAMETHASONE SODIUM PHOSPHATE 10 MG/ML IJ SOLN
20.0000 mg | Freq: Once | INTRAMUSCULAR | Status: AC
  Administered 2018-11-10: 20 mg

## 2018-11-10 MED ORDER — DEXAMETHASONE SODIUM PHOSPHATE 10 MG/ML IJ SOLN
10.0000 mg | Freq: Once | INTRAMUSCULAR | Status: AC
  Administered 2018-11-10: 10 mg

## 2018-11-10 MED ORDER — ROPIVACAINE HCL 2 MG/ML IJ SOLN
9.0000 mL | Freq: Once | INTRAMUSCULAR | Status: AC
  Administered 2018-11-10: 9 mL via INTRA_ARTICULAR

## 2018-11-10 MED ORDER — LIDOCAINE HCL 2 % IJ SOLN
20.0000 mL | Freq: Once | INTRAMUSCULAR | Status: AC
  Administered 2018-11-10: 400 mg

## 2018-11-10 MED ORDER — FENTANYL CITRATE (PF) 100 MCG/2ML IJ SOLN
INTRAMUSCULAR | Status: AC
  Filled 2018-11-10: qty 2

## 2018-11-10 MED ORDER — IOPAMIDOL (ISOVUE-M 200) INJECTION 41%
10.0000 mL | Freq: Once | INTRAMUSCULAR | Status: AC
  Administered 2018-11-10: 10 mL via EPIDURAL
  Filled 2018-11-10: qty 10

## 2018-11-10 NOTE — Progress Notes (Signed)
Nursing Pain Medication Assessment:  Safety precautions to be maintained throughout the outpatient stay will include: orient to surroundings, keep bed in low position, maintain call bell within reach at all times, provide assistance with transfer out of bed and ambulation.  Medication Inspection Compliance: Pill count conducted under aseptic conditions, in front of the patient. Neither the pills nor the bottle was removed from the patient's sight at any time. Once count was completed pills were immediately returned to the patient in their original bottle.  Medication: Oxycodone IR Pill/Patch Count: 7 of 90 pills remain Pill/Patch Appearance: Markings consistent with prescribed medication Bottle Appearance: Standard pharmacy container. Clearly labeled. Filled Date: 76 / 18 / 2019 Last Medication intake:  Yesterday

## 2018-11-10 NOTE — Patient Instructions (Signed)

## 2018-11-10 NOTE — Progress Notes (Signed)
Patient's Name: [name] [name]  MRN: [id number]  Referring Provider: [name], MD  DOB: 1966-08-12  PCP: [name], MD  DOS: 11/10/2018  Note by: [name], MD  Service setting: Ambulatory outpatient  Specialty: Interventional Pain Management  Patient type: Established  Location: [facility] (AMB) Pain Management Facility  Visit type: Interventional Procedure   Primary Reason for Visit: Interventional Pain Management Treatment. CC: Back Pain (lower)  Procedure:          Anesthesia, Analgesia, Anxiolysis:  Type: Therapeutic Sacroiliac Joint Steroid Injection          Region: Inferior Lumbosacral Region Level: PIIS (Posterior Inferior Iliac Spine) Laterality: Bilateral  Type: Moderate (Conscious) Sedation combined with Local Anesthesia Indication(s): Analgesia and Anxiety Route: Intravenous (IV) IV Access: Secured Sedation: Meaningful verbal contact was maintained at all times during the procedure  Local Anesthetic: Lidocaine 1-2%  Position: Prone           Indications: 1. SI joint arthritis   2. Myofascial pain   3. Chronic pain syndrome    Pain Score: Pre-procedure: 9 /10 Post-procedure: 0-No pain/10  Pre-op Assessment:  [name] [name] is a 53 y.o. (year old), female patient, seen today for interventional treatment. She  has a past surgical history that includes Cesarean section (1989); Esophagogastroduodenoscopy (N/A, 11/21/2012); Colonoscopy (N/A, 11/21/2012); Incisional hernia repair (N/A, 12/17/2012); Insertion of mesh (N/A, 12/17/2012); Cardiac catheterization (N/A, 01/21/2015); Appendectomy (1990s); Hernia repair; Dilation and curettage of uterus (1985); Ovarian cyst removal (2007; 2014); Abdominal hysterectomy (2005); Colonoscopy with propofol (N/A, 09/02/2017); and Esophagogastroduodenoscopy (egd) with propofol (N/A, 09/02/2017). [name] [name] has a current medication list which includes the following prescription(s): ajovy, albuterol, albuterol, aripiprazole, bacitracin-polymyxin b,  cetirizine, clonazepam, diclofenac, diclofenac sodium, estradiol, fluoxetine, fluticasone, gabapentin, hydrocortisone, hyoscyamine, lamotrigine, lamotrigine, meclizine, metoprolol tartrate, omeprazole, oxycodone, promethazine, sucralfate, sumatriptan, tizanidine, and vitamin d (ergocalciferol), and the following Facility-Administered Medications: fentanyl. Her primarily concern today is the Back Pain (lower)  Initial Vital Signs:  Pulse/HCG Rate: 68ECG Heart Rate: 66 Temp: 98.4 F (36.9 C) Resp: 18 BP: 125/77 SpO2: 98 %  BMI: Estimated body mass index is 49.68 kg/m as calculated from the following:   Height as of this encounter: 5' 2.5" (1.588 m).   Weight as of this encounter: 276 lb (125.2 kg).  Risk Assessment: Allergies: Reviewed. She is allergic to amoxicillin; penicillins; prednisone; biaxin [clarithromycin]; erythromycin base; and zithromax [azithromycin].  Allergy Precautions: None required Coagulopathies: Reviewed. None identified.  Blood-thinner therapy: None at this time Active Infection(s): Reviewed. None identified. [name] [name] is afebrile  Site Confirmation: [name] [name] was asked to confirm the procedure and laterality before marking the site Procedure checklist: Completed Consent: Before the procedure and under the influence of no sedative(s), amnesic(s), or anxiolytics, the patient was informed of the treatment options, risks and possible complications. To fulfill our ethical and legal obligations, as recommended by the American Medical Association's Code of Ethics, I have informed the patient of my clinical impression; the nature and purpose of the treatment or procedure; the risks, benefits, and possible complications of the intervention; the alternatives, including doing nothing; the risk(s) and benefit(s) of the alternative treatment(s) or procedure(s); and the risk(s) and benefit(s) of doing nothing. The patient was provided information about the general risks and possible  complications associated with the procedure. These may include, but are not limited to: failure to achieve desired goals, infection, bleeding, organ or nerve damage, allergic reactions, paralysis, and death. In addition, the patient was informed of those risks and complications associated to  the procedure, such as failure to decrease pain; infection; bleeding; organ or nerve damage with subsequent damage to sensory, motor, and/or autonomic systems, resulting in permanent pain, numbness, and/or weakness of one or several areas of the body; allergic reactions; (i.e.: anaphylactic reaction); and/or death. Furthermore, the patient was informed of those risks and complications associated with the medications. These include, but are not limited to: allergic reactions (i.e.: anaphylactic or anaphylactoid reaction(s)); adrenal axis suppression; blood sugar elevation that in diabetics may result in ketoacidosis or comma; water retention that in patients with history of congestive heart failure may result in shortness of breath, pulmonary edema, and decompensation with resultant heart failure; weight gain; swelling or edema; medication-induced neural toxicity; particulate matter embolism and blood vessel occlusion with resultant organ, and/or nervous system infarction; and/or aseptic necrosis of one or more joints. Finally, the patient was informed that Medicine is not an exact science; therefore, there is also the possibility of unforeseen or unpredictable risks and/or possible complications that may result in a catastrophic outcome. The patient indicated having understood very clearly. We have given the patient no guarantees and we have made no promises. Enough time was given to the patient to ask questions, all of which were answered to the patient's satisfaction. [name] [name] has indicated that she wanted to continue with the procedure. Attestation: I, the ordering provider, attest that I have discussed with the patient  the benefits, risks, side-effects, alternatives, likelihood of achieving goals, and potential problems during recovery for the procedure that I have provided informed consent. Date  Time: 11/10/2018  9:57 AM  Pre-Procedure Preparation:  Monitoring: As per clinic protocol. Respiration, ETCO2, SpO2, BP, heart rate and rhythm monitor placed and checked for adequate function Safety Precautions: Patient was assessed for positional comfort and pressure points before starting the procedure. Time-out: I initiated and conducted the "Time-out" before starting the procedure, as per protocol. The patient was asked to participate by confirming the accuracy of the "Time Out" information. Verification of the correct person, site, and procedure were performed and confirmed by me, the nursing staff, and the patient. "Time-out" conducted as per Joint Commission's Universal Protocol (UP.01.01.01). Time: 1037  Description of Procedure:          Target Area: Inferior, posterior, aspect of the sacroiliac fissure Approach: Posterior, paraspinal, ipsilateral approach. Area Prepped: Entire Lower Lumbosacral Region Prepping solution: ChloraPrep (2% chlorhexidine gluconate and 70% isopropyl alcohol) Safety Precautions: Aspiration looking for blood return was conducted prior to all injections. At no point did we inject any substances, as a needle was being advanced. No attempts were made at seeking any paresthesias. Safe injection practices and needle disposal techniques used. Medications properly checked for expiration dates. SDV (single dose vial) medications used. Description of the Procedure: Protocol guidelines were followed. The patient was placed in position over the procedure table. The target area was identified and the area prepped in the usual manner. Skin & deeper tissues infiltrated with local anesthetic. Appropriate amount of time allowed to pass for local anesthetics to take effect. The procedure needle was  advanced under fluoroscopic guidance into the sacroiliac joint until a firm endpoint was obtained. Proper needle placement secured. Negative aspiration confirmed. Solution injected in intermittent fashion, asking for systemic symptoms every 0.5cc of injectate. The needles were then removed and the area cleansed, making sure to leave some of the prepping solution back to take advantage of its long term bactericidal properties. Vitals:   11/10/18 1050 11/10/18 1059 11/10/18 1110 11/10/18 1120  BP: Marland Kitchen)  167/96 (!) 123/53 120/74 (!) 137/53  Pulse:      Resp: 18 15 16 16   Temp:  98.2 F (36.8 C)    TempSrc:      SpO2: 96% 99% 99% 99%  Weight:      Height:        Start Time: 1037 hrs. End Time: 1048 hrs. Materials:  Needle(s) Type: Spinal Needle Gauge: 22G Length: 3.5-in Medication(s): Please see orders for medications and dosing details. 5 cc solution made of 4 cc of 0.2% ropivacaine, 1 cc of Decadron 10 mg/cc.  2.5 cc injected LEFT intra-articular, 2.5 cc injected periarticular left SI joint 5 cc solution made of 4 cc of 0.2% ropivacaine, 1 cc of Decadron 10 mg/cc.  2.5 cc injected RIGHT intra-articular, 2.5 cc injected periarticular RIGHT SI joint Imaging Guidance (Non-Spinal):          Type of Imaging Technique: Fluoroscopy Guidance (Non-Spinal) Indication(s): Assistance in needle guidance and placement for procedures requiring needle placement in or near specific anatomical locations not easily accessible without such assistance. Exposure Time: Please see nurses notes. Contrast: Before injecting any contrast, we confirmed that the patient did not have an allergy to iodine, shellfish, or radiological contrast. Once satisfactory needle placement was completed at the desired level, radiological contrast was injected. Contrast injected under live fluoroscopy. No contrast complications. See chart for type and volume of contrast used. Fluoroscopic Guidance: I was personally present during the use  of fluoroscopy. "Tunnel Vision Technique" used to obtain the best possible view of the target area. Parallax error corrected before commencing the procedure. "Direction-depth-direction" technique used to introduce the needle under continuous pulsed fluoroscopy. Once target was reached, antero-posterior, oblique, and lateral fluoroscopic projection used confirm needle placement in all planes. Images permanently stored in EMR. Interpretation: I personally interpreted the imaging intraoperatively. Adequate needle placement confirmed in multiple planes. Appropriate spread of contrast into desired area was observed. No evidence of afferent or efferent intravascular uptake. Permanent images saved into the patient's record.  Antibiotic Prophylaxis:   Anti-infectives (From admission, onward)   None     Indication(s): None identified  Post-operative Assessment:  Post-procedure Vital Signs:  Pulse/HCG Rate: 6866 Temp: 98.2 F (36.8 C) Resp: 16 BP: (!) 137/53 SpO2: 99 %  EBL: None  Complications: No immediate post-treatment complications observed by team, or reported by patient.  Note: The patient tolerated the entire procedure well. A repeat set of vitals were taken after the procedure and the patient was kept under observation following institutional policy, for this type of procedure. Post-procedural neurological assessment was performed, showing return to baseline, prior to discharge. The patient was provided with post-procedure discharge instructions, including a section on how to identify potential problems. Should any problems arise concerning this procedure, the patient was given instructions to immediately contact us, at any time, without hesitation. In any case, we plan to contact the patient by telephone for a follow-up status report regarding this interventional procedure.  Comments:  No additional relevant information.  Procedure #2:  Anesthesia, Analgesia, Anxiolysis:  Type: Trigger  Point Injection  Level: lumbar/sacral Position: Prone Target Muscle: lumbar paraspinal muscles Region: Lower  Thoracolumbar area. Primary Purpose: Diagnostic  Type: Local Anesthesia    Indications: Myofascial pain syndrome  Pain Score: Pre-procedure: 9 /10 Post-procedure: 0-No pain/10  Pre-op Assessment:  [name] [name] is a 53 y.o. (year old), female patient, seen today for interventional treatment. She  has a past surgical history that includes Cesarean section (1989); Esophagogastroduodenoscopy (N/A, 11/21/2012); Colonoscopy (  N/A, 11/21/2012); Incisional hernia repair (N/A, 12/17/2012); Insertion of mesh (N/A, 12/17/2012); Cardiac catheterization (N/A, 01/21/2015); Appendectomy (1990s); Hernia repair; Dilation and curettage of uterus (1985); Ovarian cyst removal (2007; 2014); Abdominal hysterectomy (2005); Colonoscopy with propofol (N/A, 09/02/2017); and Esophagogastroduodenoscopy (egd) with propofol (N/A, 09/02/2017). [name] [name] has a current medication list which includes the following prescription(s): ajovy, albuterol, albuterol, aripiprazole, bacitracin-polymyxin b, cetirizine, clonazepam, diclofenac, diclofenac sodium, estradiol, fluoxetine, fluticasone, gabapentin, hydrocortisone, hyoscyamine, lamotrigine, lamotrigine, meclizine, metoprolol tartrate, omeprazole, oxycodone, promethazine, sucralfate, sumatriptan, tizanidine, and vitamin d (ergocalciferol), and the following Facility-Administered Medications: fentanyl. Her primarily concern today is the Back Pain (lower)  Initial Vital Signs:  Pulse/HCG Rate: 68ECG Heart Rate: 66 Temp: 98.4 F (36.9 C) Resp: 18 BP: 125/77 SpO2: 98 %  BMI: Estimated body mass index is 49.68 kg/m as calculated from the following:   Height as of this encounter: 5' 2.5" (1.588 m).   Weight as of this encounter: 276 lb (125.2 kg).  Risk Assessment: Allergies: Reviewed. She is allergic to amoxicillin; penicillins; prednisone; biaxin [clarithromycin]; erythromycin  base; and zithromax [azithromycin].  Allergy Precautions: None required Coagulopathies: Reviewed. None identified.  Blood-thinner therapy: None at this time Active Infection(s): Reviewed. None identified. [name] [name] is afebrile  Site Confirmation: [name] [name] was asked to confirm the procedure and laterality before marking the site Procedure checklist: Completed Consent: Before the procedure and under the influence of no sedative(s), amnesic(s), or anxiolytics, the patient was informed of the treatment options, risks and possible complications. To fulfill our ethical and legal obligations, as recommended by the American Medical Association's Code of Ethics, I have informed the patient of my clinical impression; the nature and purpose of the treatment or procedure; the risks, benefits, and possible complications of the intervention; the alternatives, including doing nothing; the risk(s) and benefit(s) of the alternative treatment(s) or procedure(s); and the risk(s) and benefit(s) of doing nothing. The patient was provided information about the general risks and possible complications associated with the procedure. These may include, but are not limited to: failure to achieve desired goals, infection, bleeding, organ or nerve damage, allergic reactions, paralysis, and death. In addition, the patient was informed of those risks and complications associated to the procedure, such as failure to decrease pain; infection; bleeding; organ or nerve damage with subsequent damage to sensory, motor, and/or autonomic systems, resulting in permanent pain, numbness, and/or weakness of one or several areas of the body; allergic reactions; (i.e.: anaphylactic reaction); and/or death. Furthermore, the patient was informed of those risks and complications associated with the medications. These include, but are not limited to: allergic reactions (i.e.: anaphylactic or anaphylactoid reaction(s)); adrenal axis suppression;  blood sugar elevation that in diabetics may result in ketoacidosis or comma; water retention that in patients with history of congestive heart failure may result in shortness of breath, pulmonary edema, and decompensation with resultant heart failure; weight gain; swelling or edema; medication-induced neural toxicity; particulate matter embolism and blood vessel occlusion with resultant organ, and/or nervous system infarction; and/or aseptic necrosis of one or more joints. Finally, the patient was informed that Medicine is not an exact science; therefore, there is also the possibility of unforeseen or unpredictable risks and/or possible complications that may result in a catastrophic outcome. The patient indicated having understood very clearly. We have given the patient no guarantees and we have made no promises. Enough time was given to the patient to ask questions, all of which were answered to the patient's satisfaction. [name] [name] has indicated that she wanted to continue  with the procedure. Attestation: I, the ordering provider, attest that I have discussed with the patient the benefits, risks, side-effects, alternatives, likelihood of achieving goals, and potential problems during recovery for the procedure that I have provided informed consent. Date  Time: 11/10/2018  9:57 AM  Pre-Procedure Preparation:  Monitoring: As per clinic protocol. Respiration, ETCO2, SpO2, BP, heart rate and rhythm monitor placed and checked for adequate function Safety Precautions: Patient was assessed for positional comfort and pressure points before starting the procedure. Time-out: I initiated and conducted the "Time-out" before starting the procedure, as per protocol. The patient was asked to participate by confirming the accuracy of the "Time Out" information. Verification of the correct person, site, and procedure were performed and confirmed by me, the nursing staff, and the patient. "Time-out" conducted as per Joint  Commission's Universal Protocol (UP.01.01.01). Time: 1037  Description of Procedure:          Prepping solution: ChloraPrep (2% chlorhexidine gluconate and 70% isopropyl alcohol) Safety Precautions: Aspiration looking for blood return was conducted prior to all injections. At no point did we inject any substances, as a needle was being advanced. No attempts were made at seeking any paresthesias. Safe injection practices and needle disposal techniques used. Medications properly checked for expiration dates. SDV (single dose vial) medications used. Description of the Procedure: Protocol guidelines were followed. The patient was placed in position over the fluoroscopy table. The target area was identified and the area prepped in the usual manner. Skin & deeper tissues infiltrated with local anesthetic. Appropriate amount of time allowed to pass for local anesthetics to take effect. The procedure needles were then advanced to the target area. Proper needle placement secured. Negative aspiration confirmed. Solution injected in intermittent fashion, asking for systemic symptoms every 0.5cc of injectate. The needles were then removed and the area cleansed, making sure to leave some of the prepping solution back to take advantage of its long term bactericidal properties.  Vitals:   11/10/18 1050 11/10/18 1059 11/10/18 1110 11/10/18 1120  BP: (!) 167/96 (!) 123/53 120/74 (!) 137/53  Pulse:      Resp: 18 15 16 16   Temp:  98.2 F (36.8 C)    TempSrc:      SpO2: 96% 99% 99% 99%  Weight:      Height:        Start Time: 1037 hrs. End Time: 1048 hrs. Materials:  Needle(s) Type: Regular needle Gauge: 25G Length: 1.5-in Medication(s): Please see orders for medications and dosing details.  Approximately 10 trigger points injected, each with 1 cc of 0.2% Ropivacaine with dry needling done.  Plan of Care   Imaging Orders     DG C-Arm 1-60 Min-No Report Procedure Orders    No procedure(s) ordered today    Also refill Oxycodone, PMP checked and appropriate.  Medications ordered for procedure: Meds ordered this encounter  Medications  . iopamidol (ISOVUE-M) 41 % intrathecal injection 10 mL    Must be Myelogram-compatible. If not available, you may substitute with a water-soluble, non-ionic, hypoallergenic, myelogram-compatible radiological contrast medium.  Marland Kitchen lidocaine (XYLOCAINE) 2 % (with pres) injection 400 mg  . fentaNYL (SUBLIMAZE) injection 25-50 mcg    Make sure Narcan is available in the pyxis when using this medication. In the event of respiratory depression (RR< 8/min): Titrate NARCAN (naloxone) in increments of 0.1 to 0.2 mg IV at 2-3 minute intervals, until desired degree of reversal.  . lactated ringers infusion 1,000 mL  . ropivacaine (PF) 2 mg/mL (0.2%) (  NAROPIN) injection 9 mL  . dexamethasone (DECADRON) injection 10 mg  . dexamethasone (DECADRON) injection 20 mg   Medications administered: We administered iopamidol, lidocaine, fentaNYL, lactated ringers, ropivacaine (PF) 2 mg/mL (0.2%), dexamethasone, and dexamethasone.  See the medical record for exact dosing, route, and time of administration.  Disposition: Discharge home  Discharge Date & Time: 11/10/2018; 1120 hrs.   Physician-requested Follow-up: Return in about 8 weeks (around 01/05/2019) for Post Procedure Evaluation, Medication Management.  Future Appointments  Date Time Provider [place]  11/21/2018  9:00 AM Archie Balboa, LCAS-A [facility] None  01/01/2019 10:30 AM [name], MD [facility] None  02/24/2019  8:00 AM Ward Givens, NP [facility] None   Primary Care Physician: [name], MD Location: [facility] Outpatient Pain Management Facility Note by: [name], MD Date: 11/10/2018; Time: 11:50 AM  Disclaimer:  Medicine is not an exact science. The only guarantee in medicine is that nothing is guaranteed. It is important to note that the decision to proceed with this intervention was based on the  information collected from the patient. The Data and conclusions were drawn from the patient's questionnaire, the interview, and the physical examination. Because the information was provided in large part by the patient, it cannot be guaranteed that it has not been purposely or unconsciously manipulated. Every effort has been made to obtain as much relevant data as possible for this evaluation. It is important to note that the conclusions that lead to this procedure are derived in large part from the available data. Always take into account that the treatment will also be dependent on availability of resources and existing treatment guidelines, considered by other Pain Management Practitioners as being common knowledge and practice, at the time of the intervention. For Medico-Legal purposes, it is also important to point out that variation in procedural techniques and pharmacological choices are the acceptable norm. The indications, contraindications, technique, and results of the above procedure should only be interpreted and judged by a Board-Certified Interventional Pain Specialist with extensive familiarity and expertise in the same exact procedure and technique.

## 2018-11-11 ENCOUNTER — Telehealth: Payer: Self-pay

## 2018-11-11 NOTE — Telephone Encounter (Signed)
States she has been hurting bad and was unable to sleep well. Instructed to use heat today and if she needed anything to call us back.

## 2018-11-13 ENCOUNTER — Encounter (HOSPITAL_COMMUNITY): Payer: Self-pay | Admitting: Licensed Clinical Social Worker

## 2018-11-13 NOTE — Progress Notes (Signed)
   THERAPIST PROGRESS NOTE  Session Time: 9-10  Participation Level: Active  Behavioral Response: Well GroomedAlertDepressed  Type of Therapy: Individual Therapy  Treatment Goals addressed: Communication: Vulnerability coaching and Coping  Interventions: CBT  Summary: [name] [name] is a 53 y.o. female who presents with PTSD and sxs of depression. She is present, engaged, and becomes tearful in her individual session. She reports she continues to feel down due to her husband's lack of motivation to help himself or her around the house. PT states they spend majority of time at home watching TV. She wants more help preparing food and cleaning. Counselor and PT discuss ways to become more assertive and share her feelings of disappointment w/o letting anger overpower her words.  PT leaves session stating she feels relieved and enjoys having a place to "vent" her feelings w/o judgment. She reports she continues to feel judged by others when she talk about her pain bc "they think I'm exaggerating it".   Suicidal/Homicidal: Nowithout intent/plan  Therapist Response: Counselor used open questions, active listening, and therapeutic reflection of emotion and thoughts. Counselor worked to help PT consider and implement alternatives to her current passive communication, sharing her feelings more w/ her husband, and not judging herself for being in pain.   Plan: Return again in 4 weeks.  Diagnosis:    ICD-10-CM   1. Chronic post-traumatic stress disorder (PTSD) F43.12       Archie Balboa, LCAS-A 11/13/2018

## 2018-11-16 ENCOUNTER — Other Ambulatory Visit: Payer: Self-pay

## 2018-11-16 ENCOUNTER — Encounter (HOSPITAL_COMMUNITY): Payer: Self-pay | Admitting: *Deleted

## 2018-11-16 ENCOUNTER — Ambulatory Visit (INDEPENDENT_AMBULATORY_CARE_PROVIDER_SITE_OTHER)
Admission: EM | Admit: 2018-11-16 | Discharge: 2018-11-16 | Disposition: A | Discharge status: Home / Self Care | Payer: BC Managed Care – PPO | Source: Home / Self Care

## 2018-11-16 ENCOUNTER — Emergency Department (HOSPITAL_BASED_OUTPATIENT_CLINIC_OR_DEPARTMENT_OTHER): Payer: BC Managed Care – PPO

## 2018-11-16 ENCOUNTER — Encounter (HOSPITAL_COMMUNITY): Payer: Self-pay | Admitting: Emergency Medicine

## 2018-11-16 ENCOUNTER — Emergency Department (HOSPITAL_COMMUNITY)
Admission: EM | Admit: 2018-11-16 | Discharge: 2018-11-16 | Disposition: A | Discharge status: Home / Self Care | Payer: BC Managed Care – PPO | Attending: Emergency Medicine | Admitting: Emergency Medicine

## 2018-11-16 DIAGNOSIS — M5431 Sciatica, right side: Secondary | ICD-10-CM | POA: Diagnosis not present

## 2018-11-16 DIAGNOSIS — R1031 Right lower quadrant pain: Secondary | ICD-10-CM

## 2018-11-16 DIAGNOSIS — M79604 Pain in right leg: Secondary | ICD-10-CM | POA: Diagnosis present

## 2018-11-16 DIAGNOSIS — J45909 Unspecified asthma, uncomplicated: Secondary | ICD-10-CM | POA: Insufficient documentation

## 2018-11-16 DIAGNOSIS — Z79899 Other long term (current) drug therapy: Secondary | ICD-10-CM | POA: Insufficient documentation

## 2018-11-16 DIAGNOSIS — M79651 Pain in right thigh: Secondary | ICD-10-CM

## 2018-11-16 DIAGNOSIS — M79609 Pain in unspecified limb: Secondary | ICD-10-CM | POA: Diagnosis not present

## 2018-11-16 MED ORDER — IBUPROFEN 600 MG PO TABS: 600.0000 mg | ORAL_TABLET | Freq: Four times a day (QID) | ORAL | 0 refills | Status: AC | PRN

## 2018-11-16 MED ORDER — DIAZEPAM 5 MG PO TABS: 5.0000 mg | ORAL_TABLET | Freq: Two times a day (BID) | ORAL | 0 refills | Status: DC

## 2018-11-16 MED ORDER — DIAZEPAM 5 MG PO TABS
5.0000 mg | ORAL_TABLET | Freq: Once | ORAL | Status: AC
  Administered 2018-11-16: 5 mg via ORAL
  Filled 2018-11-16: qty 1

## 2018-11-16 MED ORDER — DEXAMETHASONE SODIUM PHOSPHATE 10 MG/ML IJ SOLN
10.0000 mg | Freq: Once | INTRAMUSCULAR | Status: AC
  Administered 2018-11-16: 10 mg via INTRAMUSCULAR
  Filled 2018-11-16: qty 1

## 2018-11-16 MED ORDER — KETOROLAC TROMETHAMINE 30 MG/ML IJ SOLN
30.0000 mg | Freq: Once | INTRAMUSCULAR | Status: AC
  Administered 2018-11-16: 30 mg via INTRAMUSCULAR
  Filled 2018-11-16: qty 1
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
## 2018-11-16 NOTE — ED Triage Notes (Addendum)
C/O right thigh pain radiating up into right groin and down into right knee x 3 days.  C/O painful ambulation.  Denies injury.

## 2018-11-16 NOTE — ED Notes (Signed)
Patient verbalized understanding of discharge instructions and denies any further needs or questions at this time. VS stable. Patient ambulatory with steady gait. Assisted to ED entrance in wheelchair and security called to take patient to car at urgent care.

## 2018-11-16 NOTE — ED Triage Notes (Signed)
Patient sent from [facility] to ED for further evaluation of R upper leg pain - patient reports pain starts in R groin and radiates down R leg to her knee - onset 3 days ago, worse with ambulation, no known injury, no relief with heat/ice, elevation, massage. Patient also endorses tingling to R foot same onset.

## 2018-11-16 NOTE — Progress Notes (Signed)
VASCULAR LAB PRELIMINARY  PRELIMINARY  PRELIMINARY  PRELIMINARY  Right lower extremity venous duplexcompleted.    Preliminary report:  See CV proc for preliminary results.  Gave Dr. Gilford Raid results  Mauro Kaufmann, Kaydyn Chism, RVT 11/16/2018, 1:46 PM

## 2018-11-17 ENCOUNTER — Emergency Department (HOSPITAL_COMMUNITY)
Admission: EM | Admit: 2018-11-17 | Discharge: 2018-11-17 | Disposition: A | Discharge status: Home / Self Care | Payer: BC Managed Care – PPO | Attending: Emergency Medicine | Admitting: Emergency Medicine

## 2018-11-17 ENCOUNTER — Telehealth: Payer: Self-pay | Admitting: *Deleted

## 2018-11-17 ENCOUNTER — Encounter (HOSPITAL_COMMUNITY): Payer: Self-pay | Admitting: Emergency Medicine

## 2018-11-17 ENCOUNTER — Emergency Department (HOSPITAL_COMMUNITY): Payer: BC Managed Care – PPO

## 2018-11-17 ENCOUNTER — Other Ambulatory Visit: Payer: Self-pay

## 2018-11-17 DIAGNOSIS — Z79899 Other long term (current) drug therapy: Secondary | ICD-10-CM | POA: Insufficient documentation

## 2018-11-17 DIAGNOSIS — M5431 Sciatica, right side: Secondary | ICD-10-CM | POA: Insufficient documentation

## 2018-11-17 DIAGNOSIS — J45909 Unspecified asthma, uncomplicated: Secondary | ICD-10-CM | POA: Insufficient documentation

## 2018-11-17 DIAGNOSIS — R200 Anesthesia of skin: Secondary | ICD-10-CM | POA: Diagnosis present

## 2018-11-17 LAB — BASIC METABOLIC PANEL
Anion gap: 12 (ref 5–15)
BUN: 11 mg/dL (ref 6–20)
CO2: 22 mmol/L (ref 22–32)
Calcium: 9.3 mg/dL (ref 8.9–10.3)
Chloride: 105 mmol/L (ref 98–111)
Creatinine, Ser: 0.84 mg/dL (ref 0.44–1.00)
GFR calc Af Amer: >60 mL/min (ref >60)
GFR calc non Af Amer: >60 mL/min (ref >60)
GLUCOSE: 129 mg/dL — AB (ref 70–99)
Potassium: 3.8 mmol/L (ref 3.5–5.1)
Sodium: 139 mmol/L (ref 135–145)

## 2018-11-17 LAB — URINALYSIS, ROUTINE W REFLEX MICROSCOPIC
Bilirubin Urine: NEGATIVE
GLUCOSE, UA: NEGATIVE mg/dL
Hgb urine dipstick: NEGATIVE
Ketones, ur: NEGATIVE mg/dL
Leukocytes,Ua: NEGATIVE
Nitrite: NEGATIVE
Protein, ur: NEGATIVE mg/dL
SPECIFIC GRAVITY, URINE: 1.023 (ref 1.005–1.030)
pH: 7 (ref 5.0–8.0)

## 2018-11-17 LAB — CBC WITH DIFFERENTIAL/PLATELET
Abs Immature Granulocytes: 0.11 10*3/uL — ABNORMAL HIGH (ref 0.00–0.07)
Basophils Absolute: 0 10*3/uL (ref 0.0–0.1)
Basophils Relative: 0 %
Eosinophils Absolute: 0 10*3/uL (ref 0.0–0.5)
Eosinophils Relative: 0 %
HEMATOCRIT: 41.3 % (ref 36.0–46.0)
HEMOGLOBIN: 13.1 g/dL (ref 12.0–15.0)
Immature Granulocytes: 1 %
Lymphocytes Relative: 23 %
Lymphs Abs: 3.3 10*3/uL (ref 0.7–4.0)
MCH: 27.7 pg (ref 26.0–34.0)
MCHC: 31.7 g/dL (ref 30.0–36.0)
MCV: 87.3 fL (ref 80.0–100.0)
Monocytes Absolute: 1 10*3/uL (ref 0.1–1.0)
Monocytes Relative: 7 %
Neutro Abs: 9.9 10*3/uL — ABNORMAL HIGH (ref 1.7–7.7)
Neutrophils Relative %: 69 %
Platelets: 207 10*3/uL (ref 150–400)
RBC: 4.73 MIL/uL (ref 3.87–5.11)
RDW: 14 % (ref 11.5–15.5)
WBC: 14.3 10*3/uL — ABNORMAL HIGH (ref 4.0–10.5)
nRBC: 0 % (ref 0.0–0.2)

## 2018-11-17 MED ORDER — DIAZEPAM 5 MG PO TABS
5.0000 mg | ORAL_TABLET | Freq: Once | ORAL | Status: AC
  Administered 2018-11-17: 5 mg via ORAL
  Filled 2018-11-17: qty 1

## 2018-11-17 MED ORDER — KETOROLAC TROMETHAMINE 15 MG/ML IJ SOLN
15.0000 mg | Freq: Once | INTRAMUSCULAR | Status: AC
  Administered 2018-11-17: 15 mg via INTRAVENOUS
  Filled 2018-11-17: qty 1

## 2018-11-17 MED ORDER — ACETAMINOPHEN 500 MG PO TABS
1000.0000 mg | ORAL_TABLET | Freq: Once | ORAL | Status: AC
  Administered 2018-11-17: 1000 mg via ORAL
  Filled 2018-11-17: qty 2

## 2018-11-17 MED ORDER — LORAZEPAM 2 MG/ML IJ SOLN
1.0000 mg | Freq: Once | INTRAMUSCULAR | Status: AC
  Administered 2018-11-17: 1 mg via INTRAVENOUS
  Filled 2018-11-17: qty 1

## 2018-11-17 MED ORDER — HYDROMORPHONE HCL 1 MG/ML IJ SOLN
1.0000 mg | Freq: Once | INTRAMUSCULAR | Status: AC
  Administered 2018-11-17: 1 mg via INTRAVENOUS
  Filled 2018-11-17: qty 1

## 2018-11-17 NOTE — Telephone Encounter (Signed)
Dr Holley Raring, patient states she cant take steroids orally.

## 2018-11-17 NOTE — ED Notes (Signed)
Post void volume 168.

## 2018-11-17 NOTE — Discharge Instructions (Signed)
Follow up with your PCP and pain management.  Return for worsening numbness, weakness to the leg, if you cant feel your bottom or if you get a fever.

## 2018-11-17 NOTE — ED Triage Notes (Signed)
Pt c/o right thigh numbness and increased pain. Seen yesterday for same. Reports urine incontinence x 2 today.

## 2018-11-17 NOTE — ED Notes (Signed)
Pt being transported to MRI.

## 2018-11-17 NOTE — ED Notes (Signed)
Patient currently at MRI

## 2018-11-17 NOTE — ED Notes (Signed)
Pt bladder scanned. Highest amount scanned was 163. Informed Jessica - RN.

## 2018-11-17 NOTE — ED Provider Notes (Signed)
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 home to follow-up with PCP, pain mgmt. We also discussed returning to the ED immediately if new or worsening sx occur. We discussed the sx which are most concerning (e.g., sudden worsening pain, fever, cauda equina s/sx) that necessitate immediate return. Medications administered to the patient during their visit and any new prescriptions provided to the patient are listed below.  Medications given during this visit Medications  HYDROmorphone (DILAUDID) injection 1 mg (1 mg Intravenous Given 11/17/18 1716)  ketorolac (TORADOL) 15 MG/ML injection 15 mg (15 mg Intravenous Given 11/17/18 1716)  acetaminophen (TYLENOL) tablet 1,000 mg (1,000 mg Oral Given 11/17/18 1716)  diazepam (VALIUM) tablet 5 mg (5 mg Oral Given 11/17/18 1716)  LORazepam (ATIVAN) injection 1 mg (1 mg Intravenous Given 11/17/18 1907)      The patient appears reasonably screen and/or stabilized for discharge and I doubt any other medical condition or other [facility] requiring further screening, evaluation, or treatment in the ED at this time prior to discharge.   Final Clinical Impressions(s) / ED Diagnoses   Final diagnoses:  Sciatica of right side    ED Discharge Orders    None       Deno Etienne, DO 11/17/18 2332

## 2018-11-17 NOTE — Telephone Encounter (Signed)
Spoke with patient and she states that she is having pain in her low back radiating to groin and down right leg to below knee in the front and back with numbness in right toes.  It is excruciating when lying and sitting.  Went to ED and they checked her for a blood clot.  Patient states she is taking oxycodone and tizanidine but nothing is helping. Patient had a procedure (Bilateral SIJI) on 11-10-2018

## 2018-11-21 ENCOUNTER — Other Ambulatory Visit: Payer: Self-pay

## 2018-11-21 ENCOUNTER — Ambulatory Visit (INDEPENDENT_AMBULATORY_CARE_PROVIDER_SITE_OTHER): Payer: BC Managed Care – PPO | Admitting: Licensed Clinical Social Worker

## 2018-11-21 ENCOUNTER — Encounter (HOSPITAL_COMMUNITY): Payer: Self-pay | Admitting: Licensed Clinical Social Worker

## 2018-11-21 DIAGNOSIS — F4312 Post-traumatic stress disorder, chronic: Secondary | ICD-10-CM | POA: Diagnosis not present

## 2018-11-21 NOTE — Progress Notes (Signed)
Virtual Visit via Video Note  I connected with [place] on 11/21/18 at  9:00 AM EDT by a video enabled telemedicine application and verified that I am speaking with the correct person using two identifiers.   I discussed the limitations of evaluation and management by telemedicine and the availability of in person appointments. The patient expressed understanding and agreed to proceed.      I discussed the assessment and treatment plan with the patient. The patient was provided an opportunity to ask questions and all were answered. The patient agreed with the plan and demonstrated an understanding of the instructions.   The patient was advised to call back or seek an in-person evaluation if the symptoms worsen or if the condition fails to improve as anticipated.  I provided 50 minutes of non-face-to-face time during this encounter.   Archie Balboa, LCAS-A    THERAPIST PROGRESS NOTE  Session Time: 9-10  Participation Level: Active  Behavioral Response: CasualAlertEuthymic  Type of Therapy: Individual Therapy  Treatment Goals addressed: Diagnosis: PTSD  Interventions: CBT, Biofeedback and Meditation: Guided Imagery for pain relief  Summary: [name] [name] is a 53 y.o. female who presents with PTSD and chronic pain. She is active, engaged, alert for her video session, at home. She is in bed and upright. PT states her pain is very high today and has been increasing in recent days to a level "she has never experienced". Counselor leads PT through a 15 min "Ball of Light" guided imagery meditation for pain relief and relaxation. PT states it helps her feel "less tense". Counselor and PT discuss "accepting some level of pain". PT reported she had spoken w/ her husband about "being more helpful around house" and the results were positive all around.    Suicidal/Homicidal: Nowithout intent/plan  Therapist Response: Counselor used open questions, active listening, and reflection.  Counselor used mindfulness, biofeedback, and guided imagery to improve pain management skills. Counselor validated PT's pain while also encouraging PT to accept some pain as "normal" for her.   Plan: Return again in 2 weeks.  Diagnosis:    ICD-10-CM   1. Chronic post-traumatic stress disorder (PTSD) F43.12       Archie Balboa, LCAS-A 11/21/2018

## 2018-11-22 ENCOUNTER — Encounter (HOSPITAL_COMMUNITY): Payer: Self-pay

## 2018-11-22 ENCOUNTER — Emergency Department (HOSPITAL_COMMUNITY): Payer: BC Managed Care – PPO

## 2018-11-22 ENCOUNTER — Emergency Department (HOSPITAL_COMMUNITY)
Admission: EM | Admit: 2018-11-22 | Discharge: 2018-11-22 | Disposition: A | Discharge status: Home / Self Care | Payer: BC Managed Care – PPO | Attending: Emergency Medicine | Admitting: Emergency Medicine

## 2018-11-22 ENCOUNTER — Other Ambulatory Visit: Payer: Self-pay

## 2018-11-22 DIAGNOSIS — Z79899 Other long term (current) drug therapy: Secondary | ICD-10-CM | POA: Diagnosis not present

## 2018-11-22 DIAGNOSIS — J45909 Unspecified asthma, uncomplicated: Secondary | ICD-10-CM | POA: Insufficient documentation

## 2018-11-22 DIAGNOSIS — M549 Dorsalgia, unspecified: Secondary | ICD-10-CM | POA: Diagnosis present

## 2018-11-22 DIAGNOSIS — M5431 Sciatica, right side: Secondary | ICD-10-CM | POA: Diagnosis not present

## 2018-11-22 MED ORDER — ORPHENADRINE CITRATE ER 100 MG PO TB12: 100.0000 mg | ORAL_TABLET | Freq: Two times a day (BID) | ORAL | 0 refills | Status: DC

## 2018-11-22 MED ORDER — METHYLPREDNISOLONE 4 MG PO TBPK: ORAL_TABLET | ORAL | 0 refills | Status: DC

## 2018-11-22 MED ORDER — DEXAMETHASONE SODIUM PHOSPHATE 10 MG/ML IJ SOLN
20.0000 mg | Freq: Once | INTRAMUSCULAR | Status: AC
  Administered 2018-11-22: 20 mg via INTRAMUSCULAR
  Filled 2018-11-22: qty 2

## 2018-11-22 NOTE — ED Notes (Signed)
Patient transported to X-ray 

## 2018-11-22 NOTE — ED Triage Notes (Signed)
Patient presented to ed with c/o back pain and right leg pain and thigh pain.

## 2018-11-22 NOTE — Discharge Instructions (Signed)
1.  Call your pain management specialist and discuss referral to neurosurgery for evaluation of back pain. 2.  Start Medrol Dosepak as prescribed 2 days from your emergency department visit.
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
## 2018-12-06 ENCOUNTER — Other Ambulatory Visit: Payer: Self-pay | Admitting: Student in an Organized Health Care Education/Training Program

## 2018-12-11 ENCOUNTER — Other Ambulatory Visit
Admission: RE | Admit: 2018-12-11 | Discharge: 2018-12-11 | Disposition: A | Discharge status: Home / Self Care | Payer: BC Managed Care – PPO | Source: Ambulatory Visit | Attending: Sports Medicine | Admitting: Sports Medicine

## 2018-12-11 DIAGNOSIS — M25451 Effusion, right hip: Secondary | ICD-10-CM | POA: Insufficient documentation

## 2018-12-11 DIAGNOSIS — M25551 Pain in right hip: Secondary | ICD-10-CM | POA: Insufficient documentation

## 2018-12-11 LAB — SYNOVIAL CELL COUNT + DIFF, W/ CRYSTALS
Crystals, Fluid: NONE SEEN
Eosinophils-Synovial: 0 %
Lymphocytes-Synovial Fld: 76 %
Monocyte-Macrophage-Synovial Fluid: 21 %
Neutrophil, Synovial: 3 %
WBC, Synovial: 144 /mm3 (ref 0–200)

## 2018-12-15 ENCOUNTER — Other Ambulatory Visit: Payer: Self-pay

## 2018-12-15 ENCOUNTER — Other Ambulatory Visit: Payer: Self-pay | Admitting: Sports Medicine

## 2018-12-15 ENCOUNTER — Ambulatory Visit
Payer: BC Managed Care – PPO | Attending: Student in an Organized Health Care Education/Training Program | Admitting: Student in an Organized Health Care Education/Training Program

## 2018-12-15 ENCOUNTER — Telehealth: Payer: Self-pay

## 2018-12-15 DIAGNOSIS — M4804 Spinal stenosis, thoracic region: Secondary | ICD-10-CM

## 2018-12-15 DIAGNOSIS — M533 Sacrococcygeal disorders, not elsewhere classified: Secondary | ICD-10-CM

## 2018-12-15 DIAGNOSIS — M25451 Effusion, right hip: Secondary | ICD-10-CM

## 2018-12-15 DIAGNOSIS — G8929 Other chronic pain: Secondary | ICD-10-CM

## 2018-12-15 DIAGNOSIS — M47816 Spondylosis without myelopathy or radiculopathy, lumbar region: Secondary | ICD-10-CM | POA: Diagnosis not present

## 2018-12-15 DIAGNOSIS — M7918 Myalgia, other site: Secondary | ICD-10-CM

## 2018-12-15 DIAGNOSIS — G894 Chronic pain syndrome: Secondary | ICD-10-CM | POA: Diagnosis not present

## 2018-12-15 DIAGNOSIS — M545 Low back pain, unspecified: Secondary | ICD-10-CM

## 2018-12-15 DIAGNOSIS — M5137 Other intervertebral disc degeneration, lumbosacral region: Secondary | ICD-10-CM

## 2018-12-15 DIAGNOSIS — M25551 Pain in right hip: Secondary | ICD-10-CM

## 2018-12-15 DIAGNOSIS — M461 Sacroiliitis, not elsewhere classified: Secondary | ICD-10-CM

## 2018-12-15 DIAGNOSIS — M5417 Radiculopathy, lumbosacral region: Secondary | ICD-10-CM

## 2018-12-15 DIAGNOSIS — M47818 Spondylosis without myelopathy or radiculopathy, sacral and sacrococcygeal region: Secondary | ICD-10-CM

## 2018-12-15 MED ORDER — GABAPENTIN 300 MG PO CAPS: ORAL_CAPSULE | ORAL | 3 refills | Status: DC

## 2018-12-15 NOTE — Telephone Encounter (Signed)
Medication recon completed.

## 2018-12-15 NOTE — Progress Notes (Signed)
Pain Management Virtual Encounter Note - Virtual Visit via [place] (real-time audio visits between healthcare provider and patient).  Patient's Phone No. & Preferred Pharmacy:  [phone] (home); [phone] (mobile); (Preferred) [phone] [email or web address]  [id number] [place] [address] [place] [address] Phone: [phone] Fax: [phone]  [id number] - [place], [place] [address] [place] [address] Phone: [phone] Fax: [phone]   Pre-screening note:  Our staff contacted [name] [name] and offered her an "in person", "face-to-face" appointment versus a telephone encounter. She indicated preferring the telephone encounter, at this time.  Reason for Virtual Visit: COVID-19*  Social distancing based on CDC and AMA recommendations.   I contacted KENYATTA GLOECKNER on 12/15/2018 at 10:05 AM via video conference and clearly identified myself as [name], MD. I verified that I was speaking with the correct person using two identifiers (Name and date of birth: 1966-05-08).  Advanced Informed Consent I sought verbal advanced consent from [name] for virtual visit interactions. I informed [name] [name] of possible security and privacy concerns, risks, and limitations associated with providing "not-in-person" medical evaluation and management services. I also informed [name] [name] of the availability of "in-person" appointments. Finally, I informed her that there would be a charge for the virtual visit and that she could be  personally, fully or partially, financially responsible for it. [name] [name] expressed understanding and agreed to proceed.   Historic Elements   [name] [name] is a 53 y.o. year old, female patient evaluated today after her last encounter by our practice on 12/15/2018. Ms. Sandner  has a past medical  history of Anginal pain ([place]), Anxiety, Asthma, Atrial fibrillation ([place]), Chest pain, Chronic back pain, Chronic bronchitis ([place]), Complication of anesthesia, Daily headache, Fibroid, GERD (gastroesophageal reflux disease), Head injury (1975; 2015), Joint pain, Migraine, Obesity, Class III, BMI 40-49.9 (morbid obesity) ([place]) (12/21/2012), Ovarian cyst, Pre-diabetes, Seizures ([place]), Shortness of breath dyspnea, TBI (traumatic brain injury) ([place]) (2015), TIA (transient ischemic attack) (04/2015?), and Vitamin D deficiency. She also  has a past surgical history that includes Cesarean section (1989); Esophagogastroduodenoscopy (N/A, 11/21/2012); Colonoscopy (N/A, 11/21/2012); Incisional hernia repair (N/A, 12/17/2012); Insertion of mesh (N/A, 12/17/2012); Cardiac catheterization (N/A, 01/21/2015); Appendectomy (1990s); Hernia repair; Dilation and curettage of uterus (1985); Ovarian cyst removal (2007; 2014); Abdominal hysterectomy (2005); Colonoscopy with propofol (N/A, 09/02/2017); and Esophagogastroduodenoscopy (egd) with propofol (N/A, 09/02/2017). [name] [name] has a current medication list which includes the following prescription(s): ajovy, albuterol, albuterol, aripiprazole, cetirizine, clonazepam, diazepam, diclofenac, diclofenac sodium, estradiol, fluoxetine, fluticasone, gabapentin, hydrocortisone, hyoscyamine, ibuprofen, lamotrigine, lamotrigine, meclizine, metoprolol tartrate, omeprazole, orphenadrine, oxycodone, promethazine, sumatriptan, tizanidine, vitamin d (ergocalciferol), and bacitracin-polymyxin b. She  reports that she has never smoked. She has never used smokeless tobacco. She reports previous alcohol use. She reports that she does not use drugs. Ms. Nancarrow is allergic to amoxicillin; penicillins; prednisone; ampicillin; biaxin [clarithromycin]; erythromycin base; and zithromax [azithromycin].   HPI  I last saw her on 12/06/2018. She is being evaluated for both, medication management and a post-procedure  assessment.  As well as worsening pain.  Patient endorses worsening pain of her right lower extremity that is worsened over the last month.  Patient has had multiple emergency department visits, for since 322 given right-sided sciatic symptoms.  At that time lumbar and thoracic MRI were obtained, results of which are below.  She was referred back to neurosurgery.  She has tried 2 steroid tapers in the past without significant improvement.  Patient is having pain in her right buttocks, right lateral hip, right groin with radiation to her right foot.  She did have an episode of urinary incontinence about a week ago however.denies bowel dysfunction.  She states that her right lower extremity symptoms are much worse than her left.  Of note patient has received for lumbar epidural steroid injections in the past which helped out to a mild extent for 2 to 3 weeks.  Post-Procedure Evaluation  Procedure: Bilateral sacroiliac joint injection Pre-procedure pain level:  9/10 Post-procedure: 0/10          Sedation: Please see nurses note.  Effectiveness during initial hour after procedure(Ultra-Short Term Relief):100 %  Local anesthetic used: Long-acting (4-6 hours) Effectiveness: Defined as any analgesic benefit obtained secondary to the administration of local anesthetics. This carries significant diagnostic value as to the etiological location, or anatomical origin, of the pain. Duration of benefit is expected to coincide with the duration of the local anesthetic used.  Effectiveness during initial 4-6 hours after procedure(Short-Term Relief): 100 %  Long-term benefit: Defined as any relief past the pharmacologic duration of the local anesthetics.  Effectiveness past the initial 6 hours after procedure(Long-Term Relief): 60 %  Current benefits: Defined as benefit that persist at this time.   Analgesia:  Back to baseline Function: Back to baseline ROM: Back to baseline  Pharmacotherapy Assessment   Oxycodone 5 mg daily PRN  Monitoring: Pharmacotherapy: No side-effects or adverse reactions reported. Person PMP: PDMP reviewed during this encounter.       Compliance: No problems identified or detected. Plan: Refer to "POC".  Review of recent tests  DG Hip Unilat With Pelvis 2-3 Views Right CLINICAL DATA:  Low back pain radiating down RIGHT leg for 1 week.  EXAM: DG HIP (WITH OR WITHOUT PELVIS) 2-3V RIGHT  COMPARISON:  None.  FINDINGS: There is no evidence of hip fracture or dislocation. There is no evidence of arthropathy or other focal bone abnormality.  IMPRESSION: Negative.  Electronically Signed   By: Staci Righter M.D.   On: 11/22/2018 [facility]   Hospital Outpatient Visit on 12/11/2018  Component Date Value Ref Range Status  . Color, Synovial 12/11/2018 YELLOW* YELLOW Final  . Appearance-Synovial 12/11/2018 CLEAR  CLEAR Final  . Crystals, Fluid 12/11/2018 NO CRYSTALS SEEN   Final  . WBC, Synovial 12/11/2018 144  0 - 200 /cu mm Final  . Neutrophil, Synovial 12/11/2018 3  % Final   SMALL AMOUNT OF SAMPLE SUBMITTED, LARGE DILUTION DONE FOR COUNT  . Lymphocytes-Synovial Fld 12/11/2018 76  % Final  . Monocyte-Macrophage-Synovial Fluid 12/11/2018 21  % Final  . Eosinophils-Synovial 12/11/2018 0  % Final   Performed at [facility], [address] [place], [place] [address]    Disc levels:  T12-L1:  Reported on the thoracic study today.  L1-L2:  Negative.  L2-L3:  Negative.  L3-L4: Mild chronic facet and ligament flavum hypertrophy. No stenosis.  L4-L5:  Mild facet hypertrophy.  No stenosis.  L5-S1: Disc desiccation. Small central disc protrusion most apparent on series 20, image 9 is new since 2019. Superimposed mild facet and ligament flavum hypertrophy. Degenerative facet joint fluid here has regressed since 2019. The left lateral recess is effaced (left S1 nerve level). There is otherwise no spinal or lateral recess stenosis. Borderline to  mild left greater than right L5 foraminal stenosis.  S1-S2: Transitional with assimilation joints but full size disc  space. Mild to moderate right facet hypertrophy. No stenosis.  IMPRESSION: 1. Transitional lumbosacral anatomy with a mostly lumbarized S1 level and a full size S1-S2 disc space. This differs from the numbering used on the 2019 lumbar MRI. 2. No acute osseous abnormality. Mild L5-S1 disc degeneration has progressed since 2019 and most affects the lateral recesses (left greater than right S1 nerve levels). No lumbar spinal stenosis. Up to mild bilateral L5 foraminal stenosis.  Thoracic MRI T1-T2: Negative.  T2-T3: Mild disc bulge and endplate spurring. Broad-based posterior component of disc with borderline to mild spinal stenosis (series 19, image 5). No foraminal stenosis.  T3-T4: Small to moderate left paracentral disc protrusion best seen on series 19, image 8. Epidural lipomatosis. Mild spinal stenosis and spinal cord mass effect. No foraminal stenosis.  T4-T5: Small left paracentral disc protrusion effaces the ventral CSF space but there is no spinal stenosis. Left foraminal disc osteophyte complex with mild left T4 foraminal stenosis.  T5-T6: Left eccentric disc bulge with foraminal component. No spinal stenosis. Moderate left T5 foraminal stenosis on series 15, image 12.  T6-T7: Circumferential disc bulge and endplate spurring. No stenosis.  T7-T8: Right paracentral disc protrusion superimposed on rightward disc bulging and endplate spurring. Effaced ventral CSF space on the right but no significant spinal stenosis. Mild to moderate right T7 foraminal stenosis on series 15, image 7.  T8-T9: Lobulated disc osteophyte complex (series 19, image 23), also evident on the prior CTA. Mild spinal stenosis with mild if any cord mass effect. Mild right T8 foraminal stenosis.  T9-T10: Mild disc bulge.  No stenosis.  T10-T11: Mild disc bulge  eccentric to the left. Mild left T10 foraminal stenosis.  T11-T12: Moderate bilateral facet hypertrophy. No spinal stenosis, but moderate to severe bilateral T11 foraminal stenosis (series 15, image 7 on the right).  T12-L1: Negative.  IMPRESSION: 1. No acute osseous abnormality in the thoracic spine. 2. Mild thoracic spinal stenosis at T2-T3, T3-T4, and T8-T9 related primarily to disc and endplate degeneration. Up to mild spinal cord mass effect but no cord signal abnormality. 3. Moderate to severe bilateral T11 neural foraminal stenosis related to facet arthropathy. Up to moderate neural foraminal stenosis due to disc and endplate degeneration at the left T5, and right T7 nerve levels.   Assessment  The primary encounter diagnosis was Chronic pain syndrome. Diagnoses of Back pain at L4-L5 level, Lumbar spondylosis, Lumbar facet arthropathy, Chronic left SI joint pain, Lumbosacral radiculopathy, SI joint arthritis, Myofascial pain, and Foraminal stenosis of thoracic region were also pertinent to this visit.  New onset and worsening right lower extremity pain, patient is having difficulty ambulating, having balance issues and did have 1 episode of urinary incontinence last week.  She has been seen and evaluated by neurosurgery and has not been deemed a surgical candidate.  She is status post 2 steroid tapers as well as Toradol injections which were only beneficial for a short period of time.  Today I discussed treatment options with the patient which could include physical therapy exercises, increasing her gabapentin, and possibly considering a thoracic ESI.  Patient's thoracic MRI does show right paracentral disc protrusion at T7-T8 with effaced ventral CSF space on the right but no significant spinal stenosis.  Patient also has moderate bilateral facet arthropathy at T11-T12 and severe bilateral T11 foraminal stenosis.  Patient could be a candidate for thoracic epidural steroid injection  however I do not feel that her lower extremity weakness is related to her thoracic radicular symptoms.  The patient is not complaining of increased pain in her abdomen which could be consistent with thoracic radiculopathy but not necessarily lumbar radiculopathy which is more consistent with the patient's clinical presentation.  Did discuss elective right T11-T12 epidural steroid injection.  Risks and benefits reviewed.  Can schedule after COVID-19 restrictions are lifted.  We will also increase the patient's gabapentin so that she is taking 300 mg during the day and 600 mg nightly to help with her neuropathic pain symptoms.  Patient can continue tizanidine and oxycodone as prescribed.   Plan of Care  I have discontinued [place] C. Heck's methylPREDNISolone. I have also changed her gabapentin. Additionally, I am having her maintain her albuterol, albuterol, Vitamin D (Ergocalciferol), omeprazole, cetirizine, FLUoxetine, clonazePAM, SUMAtriptan, Ajovy, lamoTRIgine, estradiol, diclofenac sodium, metoprolol tartrate, fluticasone, promethazine, meclizine, hyoscyamine, lamoTRIgine, ARIPiprazole, bacitracin-polymyxin b, hydrocortisone, oxyCODONE, diclofenac, tiZANidine, ibuprofen, diazepam, and orphenadrine.  Pharmacotherapy (Medications Ordered): Meds ordered this encounter  Medications  . gabapentin (NEURONTIN) 300 MG capsule    Sig: 300 mg qAM, 600 mg qhs    Dispense:  90 capsule    Refill:  3   Orders:  Orders Placed This Encounter  Procedures  . Thoracic Epidural Injection    Standing Status:   Future    Standing Expiration Date:   01/14/2019    Scheduling Instructions:     Level: T11-12     Laterality: Right-sided     Sedation: With Sedation.     Timeframe: ASAA    Order Specific Question:   Where will this procedure be performed?    Answer:   [facility]   Follow-up plan:   Return for keep May appt.   I discussed the assessment and treatment plan with the patient. The  patient was provided an opportunity to ask questions and all were answered. The patient agreed with the plan and demonstrated an understanding of the instructions.  Patient advised to call back or seek an in-person evaluation if the symptoms or condition worsens.  Total duration of non-face-to-face encounter: 30 minutes.  Note by: [name], MD Date: 12/15/2018; Time: 10:05 AM  Disclaimer:  * Given the special circumstances of the COVID-19 pandemic, the federal government has announced that the Office for Civil Rights (OCR) will exercise its enforcement discretion and will not impose penalties on physicians using telehealth in the event of noncompliance with regulatory requirements under the [place] and Accountability Act (HIPAA) in connection with the good faith provision of telehealth during the BVQXI-50 national public health emergency. ([place])

## 2018-12-17 ENCOUNTER — Ambulatory Visit: Admission: RE | Admit: 2018-12-17 | Payer: BC Managed Care – PPO | Source: Ambulatory Visit

## 2018-12-22 ENCOUNTER — Other Ambulatory Visit: Payer: Self-pay | Admitting: Family Medicine

## 2018-12-22 DIAGNOSIS — Z1231 Encounter for screening mammogram for malignant neoplasm of breast: Secondary | ICD-10-CM

## 2018-12-25 ENCOUNTER — Emergency Department (HOSPITAL_COMMUNITY)
Admission: EM | Admit: 2018-12-25 | Discharge: 2018-12-25 | Disposition: A | Discharge status: Home / Self Care | Payer: BC Managed Care – PPO | Attending: Emergency Medicine | Admitting: Emergency Medicine

## 2018-12-25 ENCOUNTER — Emergency Department (HOSPITAL_COMMUNITY): Payer: BC Managed Care – PPO

## 2018-12-25 ENCOUNTER — Encounter (HOSPITAL_COMMUNITY): Payer: Self-pay

## 2018-12-25 ENCOUNTER — Other Ambulatory Visit: Payer: Self-pay

## 2018-12-25 DIAGNOSIS — J45909 Unspecified asthma, uncomplicated: Secondary | ICD-10-CM | POA: Diagnosis not present

## 2018-12-25 DIAGNOSIS — Z79899 Other long term (current) drug therapy: Secondary | ICD-10-CM | POA: Insufficient documentation

## 2018-12-25 DIAGNOSIS — R569 Unspecified convulsions: Secondary | ICD-10-CM

## 2018-12-25 DIAGNOSIS — M25551 Pain in right hip: Secondary | ICD-10-CM | POA: Insufficient documentation

## 2018-12-25 DIAGNOSIS — G8929 Other chronic pain: Secondary | ICD-10-CM

## 2018-12-25 LAB — COMPREHENSIVE METABOLIC PANEL
ALT: 16 U/L (ref 0–44)
AST: 18 U/L (ref 15–41)
Albumin: 3.9 g/dL (ref 3.5–5.0)
Alkaline Phosphatase: 102 U/L (ref 38–126)
Anion gap: 11 (ref 5–15)
BUN: 10 mg/dL (ref 6–20)
CO2: 21 mmol/L — ABNORMAL LOW (ref 22–32)
Calcium: 9.2 mg/dL (ref 8.9–10.3)
Chloride: 105 mmol/L (ref 98–111)
Creatinine, Ser: 0.86 mg/dL (ref 0.44–1.00)
GFR calc Af Amer: >60 mL/min (ref >60)
GFR calc non Af Amer: >60 mL/min (ref >60)
Glucose, Bld: 155 mg/dL — ABNORMAL HIGH (ref 70–99)
Potassium: 4.3 mmol/L (ref 3.5–5.1)
Sodium: 137 mmol/L (ref 135–145)
Total Bilirubin: 0.4 mg/dL (ref 0.3–1.2)
Total Protein: 7.7 g/dL (ref 6.5–8.1)

## 2018-12-25 LAB — CBC
HCT: 42.4 % (ref 36.0–46.0)
Hemoglobin: 13.4 g/dL (ref 12.0–15.0)
MCH: 28.3 pg (ref 26.0–34.0)
MCHC: 31.6 g/dL (ref 30.0–36.0)
MCV: 89.6 fL (ref 80.0–100.0)
Platelets: 192 10*3/uL (ref 150–400)
RBC: 4.73 MIL/uL (ref 3.87–5.11)
RDW: 13.2 % (ref 11.5–15.5)
WBC: 7 10*3/uL (ref 4.0–10.5)
nRBC: 0 % (ref 0.0–0.2)

## 2018-12-25 MED ORDER — HYDROMORPHONE HCL 1 MG/ML IJ SOLN
1.0000 mg | Freq: Once | INTRAMUSCULAR | Status: DC
  Filled 2018-12-25: qty 1

## 2018-12-25 MED ORDER — HYDROMORPHONE HCL 1 MG/ML IJ SOLN
1.0000 mg | Freq: Once | INTRAMUSCULAR | Status: AC
  Administered 2018-12-25: 13:00:00 1 mg via INTRAVENOUS

## 2018-12-25 NOTE — Discharge Instructions (Addendum)
It was our pleasure to provide your ER care today - we hope that you feel better.  Your hip xrays appear unchanged from prior xrays.   For seizures, follow up with your neurologist in the next 1-2 weeks - call office to arrange appointment. No driving until cleared to do so by your doctor.   As relates your hip pain, follow up with your primary care doctor/pain doctor in the next 1-2 weeks - call to arrange appointment.  Return to ER if worse, new symptoms, fevers, recurrent seizures, other concern.

## 2018-12-25 NOTE — ED Notes (Signed)
Bed: ZD66 Expected date:  Expected time:  Means of arrival:  Comments: EMS 53 yo seizure

## 2018-12-25 NOTE — ED Notes (Signed)
Patient transported to X-ray 

## 2018-12-25 NOTE — ED Notes (Addendum)
Pt informs RN that she fell Sunday 4/26 at home on R hip. Pt has ambulated at home since fall. Pt states she has R hip pain and wants xray done. Dr. Ashok Cordia aware.

## 2018-12-25 NOTE — ED Triage Notes (Signed)
Pt BIB EMS. Pt from home. Pt hx of seizures. Pt husband witnessed seizure lasting 1 min while pt in bed. Pt A&O x4. Pt ambulatory at home, c/o pain in R hip and R leg.  Husband, Nicole Kindred, [phone]  BP 153/105 HR 100 99% RA 152 CBG Temp 97.4

## 2018-12-25 NOTE — ED Notes (Signed)
ED Provider at bedside. 
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
## 2018-12-29 ENCOUNTER — Other Ambulatory Visit: Payer: Self-pay

## 2018-12-29 ENCOUNTER — Encounter (HOSPITAL_COMMUNITY): Payer: Self-pay | Admitting: Emergency Medicine

## 2018-12-29 ENCOUNTER — Emergency Department (HOSPITAL_COMMUNITY)
Admission: EM | Admit: 2018-12-29 | Discharge: 2018-12-29 | Disposition: A | Discharge status: Home / Self Care | Payer: BC Managed Care – PPO | Attending: Emergency Medicine | Admitting: Emergency Medicine

## 2018-12-29 DIAGNOSIS — Z8673 Personal history of transient ischemic attack (TIA), and cerebral infarction without residual deficits: Secondary | ICD-10-CM | POA: Insufficient documentation

## 2018-12-29 DIAGNOSIS — R569 Unspecified convulsions: Secondary | ICD-10-CM | POA: Diagnosis not present

## 2018-12-29 DIAGNOSIS — J45909 Unspecified asthma, uncomplicated: Secondary | ICD-10-CM | POA: Diagnosis not present

## 2018-12-29 LAB — CBC WITH DIFFERENTIAL/PLATELET
Abs Immature Granulocytes: 0.03 10*3/uL (ref 0.00–0.07)
Basophils Absolute: 0 10*3/uL (ref 0.0–0.1)
Basophils Relative: 0 %
Eosinophils Absolute: 0.1 10*3/uL (ref 0.0–0.5)
Eosinophils Relative: 1 %
HCT: 40.9 % (ref 36.0–46.0)
Hemoglobin: 12.5 g/dL (ref 12.0–15.0)
Immature Granulocytes: 1 %
Lymphocytes Relative: 45 %
Lymphs Abs: 2.9 10*3/uL (ref 0.7–4.0)
MCH: 28.2 pg (ref 26.0–34.0)
MCHC: 30.6 g/dL (ref 30.0–36.0)
MCV: 92.1 fL (ref 80.0–100.0)
Monocytes Absolute: 0.5 10*3/uL (ref 0.1–1.0)
Monocytes Relative: 7 %
Neutro Abs: 3 10*3/uL (ref 1.7–7.7)
Neutrophils Relative %: 46 %
Platelets: 212 10*3/uL (ref 150–400)
RBC: 4.44 MIL/uL (ref 3.87–5.11)
RDW: 13.3 % (ref 11.5–15.5)
WBC: 6.5 10*3/uL (ref 4.0–10.5)
nRBC: 0 % (ref 0.0–0.2)

## 2018-12-29 LAB — BASIC METABOLIC PANEL
Anion gap: 5 (ref 5–15)
BUN: 20 mg/dL (ref 6–20)
CO2: 22 mmol/L (ref 22–32)
Calcium: 8.7 mg/dL — ABNORMAL LOW (ref 8.9–10.3)
Chloride: 111 mmol/L (ref 98–111)
Creatinine, Ser: 0.97 mg/dL (ref 0.44–1.00)
GFR calc Af Amer: >60 mL/min (ref >60)
GFR calc non Af Amer: >60 mL/min (ref >60)
Glucose, Bld: 105 mg/dL — ABNORMAL HIGH (ref 70–99)
Potassium: 3.7 mmol/L (ref 3.5–5.1)
Sodium: 138 mmol/L (ref 135–145)

## 2018-12-29 MED ORDER — SODIUM CHLORIDE 0.9 % IV BOLUS
500.0000 mL | Freq: Once | INTRAVENOUS | Status: AC
  Administered 2018-12-29: 17:00:00 500 mL via INTRAVENOUS

## 2018-12-29 NOTE — ED Triage Notes (Signed)
Pt reports that she  has been having seizures at home since she was here on Thursday and having shaking. Reports has video appt on Wed with her neurologist. Pt c/o right leg pain since last week

## 2018-12-29 NOTE — Discharge Instructions (Signed)
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
## 2018-12-30 ENCOUNTER — Encounter: Payer: Self-pay | Admitting: Neurology

## 2018-12-30 ENCOUNTER — Telehealth: Payer: Self-pay | Admitting: Neurology

## 2018-12-30 NOTE — Telephone Encounter (Signed)
Called the patient to review the chart for the  VV for tomorrow. No answer. LVM for the pt to call back

## 2018-12-30 NOTE — Telephone Encounter (Signed)
Patient returned your call. Call her back on [phone].

## 2018-12-30 NOTE — Telephone Encounter (Addendum)
Called the patient to review their chart and made sure that everything was up to date. Patient informed they do have the e-mail for the visit and I texted for the visit. Instructed to make sure they hold on to the e-mail for the upcoming appointment as it is necessary to access their appointment. Instructed the patient that apx 30 min prior to the appointment the front staff will contact them to make sure they are ready to go for their appointment in case there is any need for troubleshooting it can be completed prior to the appointment time. Pt verbalized understanding.  Patient is following up for Sz's in ED despite the recent medication changes.

## 2018-12-31 ENCOUNTER — Encounter: Payer: Self-pay | Admitting: Intensive Care

## 2018-12-31 ENCOUNTER — Encounter: Payer: Self-pay | Admitting: Student in an Organized Health Care Education/Training Program

## 2018-12-31 ENCOUNTER — Other Ambulatory Visit: Payer: Self-pay

## 2018-12-31 ENCOUNTER — Institutional Professional Consult (permissible substitution): Payer: BC Managed Care – PPO | Admitting: Neurology

## 2018-12-31 ENCOUNTER — Emergency Department
Admission: EM | Admit: 2018-12-31 | Discharge: 2018-12-31 | Disposition: A | Discharge status: Home / Self Care | Payer: BC Managed Care – PPO | Attending: Emergency Medicine | Admitting: Emergency Medicine

## 2018-12-31 ENCOUNTER — Ambulatory Visit (INDEPENDENT_AMBULATORY_CARE_PROVIDER_SITE_OTHER): Payer: BC Managed Care – PPO | Admitting: Neurology

## 2018-12-31 DIAGNOSIS — Z8673 Personal history of transient ischemic attack (TIA), and cerebral infarction without residual deficits: Secondary | ICD-10-CM | POA: Diagnosis not present

## 2018-12-31 DIAGNOSIS — Z79899 Other long term (current) drug therapy: Secondary | ICD-10-CM | POA: Diagnosis not present

## 2018-12-31 DIAGNOSIS — R569 Unspecified convulsions: Secondary | ICD-10-CM | POA: Diagnosis not present

## 2018-12-31 DIAGNOSIS — J45909 Unspecified asthma, uncomplicated: Secondary | ICD-10-CM | POA: Insufficient documentation

## 2018-12-31 DIAGNOSIS — Z0289 Encounter for other administrative examinations: Secondary | ICD-10-CM

## 2018-12-31 LAB — CBC
HCT: 39.2 % (ref 36.0–46.0)
Hemoglobin: 12.7 g/dL (ref 12.0–15.0)
MCH: 28.7 pg (ref 26.0–34.0)
MCHC: 32.4 g/dL (ref 30.0–36.0)
MCV: 88.5 fL (ref 80.0–100.0)
Platelets: 193 10*3/uL (ref 150–400)
RBC: 4.43 MIL/uL (ref 3.87–5.11)
RDW: 13.2 % (ref 11.5–15.5)
WBC: 6.8 10*3/uL (ref 4.0–10.5)
nRBC: 0 % (ref 0.0–0.2)

## 2018-12-31 LAB — BASIC METABOLIC PANEL
Anion gap: 7 (ref 5–15)
BUN: 17 mg/dL (ref 6–20)
CO2: 20 mmol/L — ABNORMAL LOW (ref 22–32)
Calcium: 8.8 mg/dL — ABNORMAL LOW (ref 8.9–10.3)
Chloride: 112 mmol/L — ABNORMAL HIGH (ref 98–111)
Creatinine, Ser: 0.93 mg/dL (ref 0.44–1.00)
GFR calc Af Amer: >60 mL/min (ref >60)
GFR calc non Af Amer: >60 mL/min (ref >60)
Glucose, Bld: 100 mg/dL — ABNORMAL HIGH (ref 70–99)
Potassium: 4 mmol/L (ref 3.5–5.1)
Sodium: 139 mmol/L (ref 135–145)

## 2018-12-31 MED ORDER — ACETAMINOPHEN 500 MG PO TABS
1000.0000 mg | ORAL_TABLET | Freq: Once | ORAL | Status: AC
  Administered 2018-12-31: 1000 mg via ORAL
  Filled 2018-12-31: qty 2

## 2018-12-31 NOTE — Telephone Encounter (Signed)
Pt has called stating she forgot that she has another 10:30 appointment this morning with another Dr. Abbott Pao is asking for RN to call if there is a way she can see Dr Brett Fairy later today.  Please call

## 2018-12-31 NOTE — Discharge Instructions (Addendum)
Please seek medical attention for any high fevers, chest pain, shortness of breath, change in behavior, persistent vomiting, bloody stool or any other new or concerning symptoms.  
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
## 2018-12-31 NOTE — Telephone Encounter (Signed)
Called the patient back and pushed her to 3:30 pm

## 2018-12-31 NOTE — Telephone Encounter (Signed)
Pt was a no show to [facility]. Front staff called the patient, no answer. I resent a text message at 3:35 pm. She didn't get on [employer] or call to say anything was going on.   If patient calls back, pt can be seen by NP as apt it is for seizures.

## 2018-12-31 NOTE — Telephone Encounter (Signed)
Pt did camera in 3:46. I joined in and informed her that Dr Brett Fairy moved onto the next patient since patient was not on at 3:30 pm. Pt was in ED and appeared to have another SZ. Advised I would make Dr Dohmeier aware of this. Informed the patient once dc they should call and we can get her rescheduled. Pt verbalized understanding and apologized for not being on at the time of her apt.

## 2018-12-31 NOTE — ED Triage Notes (Signed)
Patient c/o having multiple seizures yesterday and today. Reports taking medicine prescribed everyday for seizures. C/O headache, neck pain and no energy. A&O x4

## 2019-01-01 ENCOUNTER — Ambulatory Visit
Payer: BC Managed Care – PPO | Attending: Student in an Organized Health Care Education/Training Program | Admitting: Student in an Organized Health Care Education/Training Program

## 2019-01-01 DIAGNOSIS — M545 Low back pain, unspecified: Secondary | ICD-10-CM

## 2019-01-01 DIAGNOSIS — M5417 Radiculopathy, lumbosacral region: Secondary | ICD-10-CM | POA: Diagnosis not present

## 2019-01-01 DIAGNOSIS — M5137 Other intervertebral disc degeneration, lumbosacral region: Secondary | ICD-10-CM

## 2019-01-01 DIAGNOSIS — G8929 Other chronic pain: Secondary | ICD-10-CM

## 2019-01-01 DIAGNOSIS — M549 Dorsalgia, unspecified: Secondary | ICD-10-CM

## 2019-01-01 DIAGNOSIS — G894 Chronic pain syndrome: Secondary | ICD-10-CM | POA: Diagnosis not present

## 2019-01-01 DIAGNOSIS — M47816 Spondylosis without myelopathy or radiculopathy, lumbar region: Secondary | ICD-10-CM | POA: Diagnosis not present

## 2019-01-01 MED ORDER — OXYCODONE-ACETAMINOPHEN 7.5-325 MG PO TABS: 1.0000 | ORAL_TABLET | Freq: Two times a day (BID) | ORAL | 0 refills | Status: AC | PRN

## 2019-01-01 MED ORDER — TIZANIDINE HCL 4 MG PO TABS: 4.0000 mg | ORAL_TABLET | Freq: Every day | ORAL | 3 refills | Status: AC

## 2019-01-01 MED ORDER — METHYLPREDNISOLONE 4 MG PO TBPK: ORAL_TABLET | ORAL | 0 refills | Status: AC

## 2019-01-01 MED ORDER — DICLOFENAC EPOLAMINE 1.3 % TD PTCH: 1.0000 | MEDICATED_PATCH | Freq: Two times a day (BID) | TRANSDERMAL | 3 refills | Status: DC | PRN

## 2019-01-01 MED ORDER — GABAPENTIN 300 MG PO CAPS: ORAL_CAPSULE | ORAL | 3 refills | Status: AC

## 2019-01-01 NOTE — Progress Notes (Signed)
Pain Management Virtual Encounter Note - Virtual Visit via [place] (real-time audio visits between healthcare provider and [name]).  [name]'s Phone No. & Preferred Pharmacy:  [phone] (home); [phone] (mobile); (Preferred) [phone] [email or web address]  [id number] [place], [place] [address] Phone: [phone] Fax: [phone]  [id number] - [place], [place] [address] [place] [address] Phone: [phone] Fax: [phone]   Pre-screening note:  Our staff contacted [name] [name] and offered [name] [name] an "in person", "face-to-face" appointment versus a telephone encounter. She indicated preferring the telephone encounter, at this time.  Reason for Virtual Visit: COVID-19*  Social distancing based on CDC and AMA recommendations.   [name] contacted [name] [name] on 01/01/2019 at 2:38 PM via video conference.      [name] clearly identified myself as [name] Santa, MD. [name] verified that [name] was speaking with the correct person using two identifiers (Name and date of birth: 27-Aug-1966).  Advanced Informed Consent [name] sought verbal advanced consent from [name] [name] for virtual visit interactions. [name] informed [name] [name] of possible security and privacy concerns, risks, and limitations associated with providing "not-in-person" medical evaluation and management services. [name] also informed [name] [name] of the availability of "in-person" appointments. Finally, [name] informed [name] [name] that there would be a charge for the virtual visit and that she could be  personally, fully or partially, financially responsible for it. [name] [name] expressed understanding and agreed to proceed.   Historic Elements   [name] [name] is a 53 y.o. year old, female [name] evaluated today after [name] [name] last encounter by our practice on 12/15/2018. [name] [name]  has a past medical  history of Anginal pain ([place]), Anxiety, Asthma, Atrial fibrillation ([place]), Chest pain, Chronic back pain, Chronic bronchitis ([place]), Complication of anesthesia, Daily headache, Fibroid, GERD (gastroesophageal reflux disease), Head injury (1975; 2015), Joint pain, Migraine, Obesity, Class III, BMI 40-49.9 (morbid obesity) ([place]) (12/21/2012), Ovarian cyst, Pre-diabetes, Seizures ([place]), Shortness of breath dyspnea, TBI (traumatic brain injury) ([place]) (2015), TIA (transient ischemic attack) (04/2015?), and Vitamin D deficiency. She also  has a past surgical history that includes Cesarean section (1989); Esophagogastroduodenoscopy (N/A, 11/21/2012); Colonoscopy (N/A, 11/21/2012); Incisional hernia repair (N/A, 12/17/2012); Insertion of mesh (N/A, 12/17/2012); Cardiac catheterization (N/A, 01/21/2015); Appendectomy (1990s); Hernia repair; Dilation and curettage of uterus (1985); Ovarian cyst removal (2007; 2014); Abdominal hysterectomy (2005); Colonoscopy with propofol (N/A, 09/02/2017); and Esophagogastroduodenoscopy (egd) with propofol (N/A, 09/02/2017). [name] [name] has a current medication list which includes the following prescription(s): aripiprazole, bacitracin-polymyxin b, cetirizine, clonazepam, diazepam, diclofenac, diclofenac sodium, estradiol, fluoxetine, fluticasone, gabapentin, hydrocortisone, metoprolol tartrate, omeprazole, promethazine, sumatriptan, tizanidine, trazodone, vitamin d (ergocalciferol), hyoscyamine, ibuprofen, lamotrigine, lamotrigine, meclizine, methylprednisolone, and oxycodone-acetaminophen. She  reports that she has never smoked. She has never used smokeless tobacco. She reports previous alcohol use. She reports that she does not use drugs. [name] [name] is allergic to amoxicillin; penicillins; ampicillin; biaxin [clarithromycin]; erythromycin base; and zithromax [azithromycin].   HPI  [name] last communicated with [name] [name] on 12/15/2018. Today, she is being contacted for medication  management.   Pharmacotherapy Assessment   11/13/2018  3   10/23/2018  Oxycodone Hcl 5 MG Tablet  90.00 90 Bi Lat   [id number]   Nor (0188)   0  7.50 MME  Comm Ins   [place]    Monitoring: Pharmacotherapy: No side-effects or adverse reactions reported. [place] PMP: PDMP reviewed during this encounter.  Compliance: No problems identified or detected. Plan: Refer to "POC".  Review of recent tests  DG HIP UNILAT W OR W/O PELVIS 2-3 VIEWS RIGHT CLINICAL DATA:  RIGHT hip and leg pain  EXAM: DG HIP (WITH OR WITHOUT PELVIS) 2-3V RIGHT  COMPARISON:  11/22/2018  FINDINGS: Hips are located. No evidence of pelvic fracture or sacral fracture. Diastasis of the pubic symphysis unchanged from comparison exam. Dedicated view of the RIGHT hip demonstrates no femoral neck fracture.  IMPRESSION: Negative.  Electronically Signed   By: Suzy Bouchard M.D.   On: 12/25/2018 14:00   Admission on 12/31/2018, Discharged on 12/31/2018  Component Date Value Ref Range Status  . Sodium 12/31/2018 139  135 - 145 mmol/L Final  . Potassium 12/31/2018 4.0  3.5 - 5.1 mmol/L Final  . Chloride 12/31/2018 112* 98 - 111 mmol/L Final  . CO2 12/31/2018 20* 22 - 32 mmol/L Final  . Glucose, Bld 12/31/2018 100* 70 - 99 mg/dL Final  . BUN 12/31/2018 17  6 - 20 mg/dL Final  . Creatinine, Ser 12/31/2018 0.93  0.44 - 1.00 mg/dL Final  . Calcium 12/31/2018 8.8* 8.9 - 10.3 mg/dL Final  . GFR calc non Af Amer 12/31/2018 >60  >60 mL/min Final  . GFR calc Af Amer 12/31/2018 >60  >60 mL/min Final  . Anion gap 12/31/2018 7  5 - 15 Final   Performed at [facility], [address] [place], [place] [address]  . WBC 12/31/2018 6.8  4.0 - 10.5 K/uL Final  . RBC 12/31/2018 4.43  3.87 - 5.11 MIL/uL Final  . Hemoglobin 12/31/2018 12.7  12.0 - 15.0 g/dL Final  . HCT 12/31/2018 39.2  36.0 - 46.0 % Final  . MCV 12/31/2018 88.5  80.0 - 100.0 fL Final  . MCH 12/31/2018 28.7  26.0 - 34.0 pg Final  . MCHC 12/31/2018  32.4  30.0 - 36.0 g/dL Final  . RDW 12/31/2018 13.2  11.5 - 15.5 % Final  . Platelets 12/31/2018 193  150 - 400 K/uL Final  . nRBC 12/31/2018 0.0  0.0 - 0.2 % Final   Performed at [facility], [address], [place], [place] [address]   Assessment  The primary encounter diagnosis was Chronic pain syndrome. Diagnoses of Back pain at L4-L5 level, Lumbar spondylosis, Lumbosacral radiculopathy, Chronic upper back pain, and DDD (degenerative disc disease), lumbosacral were also pertinent to this visit.  Plan of Care  [name] have discontinued Laurel C. Cangemi's Ajovy, oxyCODONE, and orphenadrine. [name] am also having [name] [name] start on methylPREDNISolone and oxyCODONE-acetaminophen. Additionally, [name] am having [name] [name] maintain [name] [name] Vitamin D (Ergocalciferol), omeprazole, cetirizine, FLUoxetine, clonazePAM, SUMAtriptan, lamoTRIgine, estradiol, diclofenac sodium, metoprolol tartrate, fluticasone, promethazine, meclizine, hyoscyamine, ARIPiprazole, bacitracin-polymyxin b, hydrocortisone, ibuprofen, diazepam, traZODone, lamoTRIgine, diclofenac, gabapentin, and tiZANidine.  [name] complaining of increased lower extremity pain and weakness.  States that it is very difficult for [name] [name] to walk.  Of note she has had 2 recent ED visits for seizure-like activity.  [name] does have history of traumatic brain injury.  In regards to [name] [name] chronic pain, [name] saw the [name] on 428 for similar pain complaint.  We discussed and reviewed [name] [name] thoracic MRI at that time which just showed thoracic disc herniation contributing to [name] [name] thoracic radicular symptoms.  An order has been placed for a thoracic epidural steroid injection to help manage [name] [name] symptoms but given current COVID-19 situation, this is on hold until we have clearance to resume.  Given the [name]'s increased pain, we discussed increasing  [name] [name] opioid medication to oxycodone 7.5 mg.  This will be a temporary increase until we are able to manage [name] [name] pain via interventional therapies.   We will also prescribe [name] [name] Medrol Dosepak to help manage [name] [name] pain.  Refill of [name] [name] multimodal analgesics as below including diclofenac patch, gabapentin, tizanidine.  Pharmacotherapy (Medications Ordered): Meds ordered this encounter  Medications  . methylPREDNISolone (MEDROL) 4 MG TBPK tablet    Sig: Follow package instructions.    Dispense:  21 tablet    Refill:  0    Do not add to the "Automatic Refill" notification system.  . diclofenac (FLECTOR) 1.3 % PTCH    Sig: Place 1 patch onto the skin 2 (two) times daily as needed.    Dispense:  30 patch    Refill:  3  . gabapentin (NEURONTIN) 300 MG capsule    Sig: 300 mg qAM, 600 mg qhs    Dispense:  90 capsule    Refill:  3  . tiZANidine (ZANAFLEX) 4 MG tablet    Sig: Take 1 tablet (4 mg total) by mouth at bedtime.    Dispense:  90 tablet    Refill:  3  . oxyCODONE-acetaminophen (PERCOCET) 7.5-325 MG tablet    Sig: Take 1 tablet by mouth 2 (two) times daily as needed for moderate pain or severe pain. Must last 90 days.    Dispense:  90 tablet    Refill:  0    [place] STOP ACT - Not applicable. Fill one day early if pharmacy is closed on scheduled refill date.   Orders:  No orders of the defined types were placed in this encounter.  Follow-up plan:   Return in about 3 months (around 04/03/2019) for Medication Management.   [name] [name]. The [name] was provided an opportunity to ask questions and all were answered. The [name] agreed with the plan and demonstrated an understanding of the instructions.  [name] advised to call back or seek an in-person evaluation if the symptoms or condition worsens.  Total duration of non-face-to-face encounter: 41minutes.  Note by: [name] Santa, MD Date: 01/01/2019; Time: 2:38 PM  Disclaimer:  * Given the special circumstances of the COVID-19 pandemic, the federal government has announced that the Office for Civil Rights (OCR) will exercise its  enforcement discretion and will not impose penalties on physicians using telehealth in the event of noncompliance with regulatory requirements under the [place] and Accountability Act (HIPAA) in connection with the good faith provision of telehealth during the EYCXK-48 national public health emergency. ([place])

## 2019-01-08 ENCOUNTER — Encounter (HOSPITAL_COMMUNITY): Payer: Self-pay | Admitting: Licensed Clinical Social Worker

## 2019-01-08 ENCOUNTER — Ambulatory Visit (INDEPENDENT_AMBULATORY_CARE_PROVIDER_SITE_OTHER): Payer: BC Managed Care – PPO | Admitting: Licensed Clinical Social Worker

## 2019-01-08 ENCOUNTER — Other Ambulatory Visit: Payer: Self-pay

## 2019-01-08 DIAGNOSIS — F4312 Post-traumatic stress disorder, chronic: Secondary | ICD-10-CM

## 2019-01-08 NOTE — Progress Notes (Signed)
Virtual Visit via Video Note  I connected with Keslyn Teater Sauseda on 01/08/19 at  9:30 AM EDT by a video enabled telemedicine application and verified that I am speaking with the correct person using two identifiers.  Location: Patient: Home Provider: Office   I discussed the limitations of evaluation and management by telemedicine and the availability of in person appointments. The patient expressed understanding and agreed to proceed.     I discussed the assessment and treatment plan with the patient. The patient was provided an opportunity to ask questions and all were answered. The patient agreed with the plan and demonstrated an understanding of the instructions.   The patient was advised to call back or seek an in-person evaluation if the symptoms worsen or if the condition fails to improve as anticipated.  I provided 52 minutes of non-face-to-face time during this encounter.   Archie Balboa, LCAS-A    THERAPIST PROGRESS NOTE  Session Time: 9:30-10:30  Participation Level: Active  Behavioral Response: Well GroomedAlertEuthymic  Type of Therapy: Individual Therapy  Treatment Goals addressed: Anxiety  Interventions: CBT  Summary: HARRIETT AZAR is a 53 y.o. female who presents with anxiety and depression related to TBI she suffered. PT is attentive, engaged and well groomed in session. She is in her car, parked, and states she is getting ready to run an errand and get out of the house for the first time in 2 weeks. PT reports on a recent "grand mal" seizure she had about 2 weeks ago. EHR confirms PT was seen 3 times in past 2-3 weeks for "seizure and seizure-related activity". PT asks me for help on "next steps". I help PT prioritize her goals for her next visit w/ neurologist. Pt is instructed to ask about concrete, measurable goals she can expect for her seizures and sleep problems.  Suicidal/Homicidal: Nowithout intent/plan  Therapist Response: I used open questions,  active listening, and reflection. I helped PT prioritize her goal-setting and work on assertiveness w/ her doctors/providers. I helped PT understand the power of "getting curious" about her treatment and investing in the outcomes by doing research and the importance of following through on doctors recommendations.  Plan: Return again in 4 weeks.  Diagnosis:    ICD-10-CM   1. Chronic post-traumatic stress disorder (PTSD) F43.12        Archie Balboa, LCAS-A 01/08/2019

## 2019-01-14 ENCOUNTER — Other Ambulatory Visit: Payer: Self-pay

## 2019-01-14 ENCOUNTER — Encounter: Payer: BC Managed Care – PPO | Attending: Student | Admitting: Dietician

## 2019-01-14 VITALS — Wt 294.2 lb

## 2019-01-14 DIAGNOSIS — K760 Fatty (change of) liver, not elsewhere classified: Secondary | ICD-10-CM

## 2019-01-14 DIAGNOSIS — R109 Unspecified abdominal pain: Secondary | ICD-10-CM

## 2019-01-14 DIAGNOSIS — K529 Noninfective gastroenteritis and colitis, unspecified: Secondary | ICD-10-CM | POA: Diagnosis not present

## 2019-01-14 NOTE — Progress Notes (Signed)
Medical Nutrition Therapy: Visit start time: 0920  end time: 1040  Assessment:  Diagnosis: gastroenteritis, noninfective colitis, abdominal pain, fatty liver Past medical history: GERD, Atrial fibrillation, asthma, chronic bronchitis, epileptic seizures  Psychosocial issues/ stress concerns: none; takes antidepressant and anti-anxiety medications  Preferred learning method:  . No preference indicated  Current weight: 294.2lbs with rain boots Height: 5'2.5" Medications, supplements: reconciled list in medical record  Progress and evaluation:   Patient has decreased intake of fried foods, eating baked and air-fried foods   Reports ongoing abdominal pain for the past 2 years.   Diarrhea/ dumping episodes about 2-3 times a week. Had an episode a few minutes after eating spaghetti several days ago.  Patient would like to lose weight, and is considering weight loss surgery. She has discussed with her family as well as family members and friends who have had weight loss surgery.   Physical activity: none at this time due to back, hip, leg pain  Dietary Intake:  Usual eating pattern includes 2 meals and 1-2 snacks per day. Dining out frequency: 2-4 meals per week.  Breakfast: oatmeal or grits sometimes skips when not feeling well, sleeping late Snack: none Lunch: salad with meat/ cheese; club sub ([place], ham,sometimes with bacon +mayo and oil + vinegar; grilled chicken and green beans + apple pie ([employer]) Snack: fruit -- 1-2 oranges, grapes, pumelo in season, banana Supper: baked chicken/ pork chop, boiled potato; chef salad ham [place], cheese, onions regular dressing planning to try skinny girl dressing Snack: none or same as pm Beverages: crystal light mix-in packs in water, otherwise avoids artificial sweeteners, water with lemon  Nutrition Care Education: Topics covered: gastroenteritis and colitis, weight control Basic nutrition: appropriate meal and snack schedule, general nutrition  guidelines    Weight control: benefits of weight control, options for weight loss surgery, importance of GI healing prior to surgery;  Gastroenteritis, colitis: instructed on low fiber, low sugar, low fat diet to manage diarrhea; limiting/ avoiding spicy, acidic foods, and choosing bland, easy-to-digest foods; advised eating small meals and snacks every 3-4 hours during the day  Nutritional Diagnosis:  [place]-1.4 Altered GI function As related to gasroenteritis, noninfective colitis, fatty liver.  As evidenced by patient with abdominal pain and frequent diarrhea. [place]-3.3 Overweight/obesity As related to excess calories, inactivity, medications.  As evidenced by patient with current BMI of 52.9.  Intervention:   Instruction as noted above.  Patient has made some diet changes to reduce fat, sugar, and caloric intake.   Encouraged patient to keep a detailed food diary and also record GI symptoms; to bring to next appointment for evaluation.   Established goals to promote GI healing and prevent diarrhea, as well as for weight loss -- with direction from patient.  Education Materials given:  Marland Kitchen Diarrhea nutrition therapy; IBD nutrition therapy (NCM) to be sent to patient by mail . Ready or Not? (AND) . Goals/ instructions   Learner/ who was taught:  . Patient   Level of understanding: Marland Kitchen Verbalizes/ demonstrates competency   Demonstrated degree of understanding via:   Teach back Learning barriers: . None   Willingness to learn/ readiness for change: . Eager, change in progress   Monitoring and Evaluation:  Dietary intake, exercise, GI symptoms, and body weight      follow up: 02/11/19

## 2019-01-14 NOTE — Patient Instructions (Signed)
   Eat small meals and snacks every 3-4 hours during the day.   Avoid fruits and vegetables with skins and seeds (grapes, pumelo, orange are high in acid), peel apple to eat, bananas are ok, or canned fruit in light juice.   Avoid tomato sauce in spaghetti, lasagna, etc. Or have a very small amount of sauce. White sauce is high in fat and can also bother the stomach.   Plain noodles or white rice with chicken and broth are easier on the stomach.   Limit raw vegetables in salads like onions, seeded cucumbers, carrots, celery, broccoli, etc. Some lettuce and peeled, seedless cucumber is ok.   Continue to drink your probiotic drink.

## 2019-01-15 ENCOUNTER — Telehealth: Payer: Self-pay | Admitting: *Deleted

## 2019-01-15 NOTE — Telephone Encounter (Signed)
Noted  

## 2019-01-15 NOTE — Telephone Encounter (Signed)
LMVM for pt that calling to reschedule appt from MM/NP 02-24-19 at 0800 to MM/NP another day or can see Amy Lomax,NP same day and time.   Please return call.  You may reschedule if she calls back.

## 2019-01-15 NOTE — Telephone Encounter (Signed)
Pt has been r/s for same date and time with Debbora Presto, NP

## 2019-01-22 ENCOUNTER — Encounter: Payer: Self-pay | Admitting: Student in an Organized Health Care Education/Training Program

## 2019-01-26 ENCOUNTER — Telehealth: Payer: Self-pay

## 2019-01-26 ENCOUNTER — Emergency Department (HOSPITAL_COMMUNITY): Payer: BC Managed Care – PPO

## 2019-01-26 ENCOUNTER — Other Ambulatory Visit: Payer: Self-pay

## 2019-01-26 ENCOUNTER — Encounter (HOSPITAL_COMMUNITY): Payer: Self-pay | Admitting: Emergency Medicine

## 2019-01-26 ENCOUNTER — Emergency Department (HOSPITAL_COMMUNITY)
Admission: EM | Admit: 2019-01-26 | Discharge: 2019-01-26 | Disposition: A | Discharge status: Home / Self Care | Payer: BC Managed Care – PPO | Attending: Emergency Medicine | Admitting: Emergency Medicine

## 2019-01-26 DIAGNOSIS — Z20828 Contact with and (suspected) exposure to other viral communicable diseases: Secondary | ICD-10-CM | POA: Insufficient documentation

## 2019-01-26 DIAGNOSIS — J45909 Unspecified asthma, uncomplicated: Secondary | ICD-10-CM | POA: Insufficient documentation

## 2019-01-26 DIAGNOSIS — Z79899 Other long term (current) drug therapy: Secondary | ICD-10-CM | POA: Diagnosis not present

## 2019-01-26 DIAGNOSIS — J4521 Mild intermittent asthma with (acute) exacerbation: Secondary | ICD-10-CM

## 2019-01-26 DIAGNOSIS — J4520 Mild intermittent asthma, uncomplicated: Secondary | ICD-10-CM | POA: Insufficient documentation

## 2019-01-26 DIAGNOSIS — Z880 Allergy status to penicillin: Secondary | ICD-10-CM | POA: Diagnosis not present

## 2019-01-26 DIAGNOSIS — Z8673 Personal history of transient ischemic attack (TIA), and cerebral infarction without residual deficits: Secondary | ICD-10-CM | POA: Insufficient documentation

## 2019-01-26 DIAGNOSIS — R0602 Shortness of breath: Secondary | ICD-10-CM | POA: Diagnosis present

## 2019-01-26 LAB — CBC WITH DIFFERENTIAL/PLATELET
Abs Immature Granulocytes: 0.04 10*3/uL (ref 0.00–0.07)
Basophils Absolute: 0 10*3/uL (ref 0.0–0.1)
Basophils Relative: 0 %
Eosinophils Absolute: 0.2 10*3/uL (ref 0.0–0.5)
Eosinophils Relative: 3 %
HCT: 41.2 % (ref 36.0–46.0)
Hemoglobin: 13.1 g/dL (ref 12.0–15.0)
Immature Granulocytes: 1 %
Lymphocytes Relative: 48 %
Lymphs Abs: 3.5 10*3/uL (ref 0.7–4.0)
MCH: 28.7 pg (ref 26.0–34.0)
MCHC: 31.8 g/dL (ref 30.0–36.0)
MCV: 90.2 fL (ref 80.0–100.0)
Monocytes Absolute: 0.5 10*3/uL (ref 0.1–1.0)
Monocytes Relative: 7 %
Neutro Abs: 2.9 10*3/uL (ref 1.7–7.7)
Neutrophils Relative %: 41 %
Platelets: 186 10*3/uL (ref 150–400)
RBC: 4.57 MIL/uL (ref 3.87–5.11)
RDW: 13.9 % (ref 11.5–15.5)
WBC: 7.2 10*3/uL (ref 4.0–10.5)
nRBC: 0 % (ref 0.0–0.2)

## 2019-01-26 LAB — BASIC METABOLIC PANEL
Anion gap: 7 (ref 5–15)
BUN: 14 mg/dL (ref 6–20)
CO2: 21 mmol/L — ABNORMAL LOW (ref 22–32)
Calcium: 8.9 mg/dL (ref 8.9–10.3)
Chloride: 110 mmol/L (ref 98–111)
Creatinine, Ser: 0.77 mg/dL (ref 0.44–1.00)
GFR calc Af Amer: >60 mL/min (ref >60)
GFR calc non Af Amer: >60 mL/min (ref >60)
Glucose, Bld: 136 mg/dL — ABNORMAL HIGH (ref 70–99)
Potassium: 3.5 mmol/L (ref 3.5–5.1)
Sodium: 138 mmol/L (ref 135–145)

## 2019-01-26 LAB — SARS CORONAVIRUS 2 BY RT PCR (HOSPITAL ORDER, PERFORMED IN ~~LOC~~ HOSPITAL LAB): SARS Coronavirus 2: NEGATIVE

## 2019-01-26 LAB — TROPONIN I: Troponin I: <0.03 ng/mL (ref <0.03)

## 2019-01-26 MED ORDER — PREDNISONE 20 MG PO TABS: ORAL_TABLET | ORAL | 0 refills | Status: DC

## 2019-01-26 MED ORDER — BENZONATATE 100 MG PO CAPS: 100.0000 mg | ORAL_CAPSULE | Freq: Three times a day (TID) | ORAL | 0 refills | Status: DC

## 2019-01-26 MED ORDER — BENZONATATE 100 MG PO CAPS
200.0000 mg | ORAL_CAPSULE | Freq: Once | ORAL | Status: AC
  Administered 2019-01-26: 04:00:00 200 mg via ORAL
  Filled 2019-01-26: qty 2

## 2019-01-26 MED ORDER — ALBUTEROL SULFATE HFA 108 (90 BASE) MCG/ACT IN AERS
8.0000 | INHALATION_SPRAY | Freq: Once | RESPIRATORY_TRACT | Status: AC
  Administered 2019-01-26: 04:00:00 8 via RESPIRATORY_TRACT
  Filled 2019-01-26: qty 6.7

## 2019-01-26 MED ORDER — CETIRIZINE HCL 10 MG PO TBDP: 1.0000 | ORAL_TABLET | Freq: Every day | ORAL | 0 refills | Status: DC

## 2019-01-26 MED ORDER — AEROCHAMBER PLUS FLO-VU MEDIUM MISC
1.0000 | Freq: Once | Status: AC
  Administered 2019-01-26: 04:00:00 1
  Filled 2019-01-26: qty 1

## 2019-01-26 MED ORDER — GUAIFENESIN ER 600 MG PO TB12: 600.0000 mg | ORAL_TABLET | Freq: Two times a day (BID) | ORAL | 0 refills | Status: AC

## 2019-01-26 NOTE — ED Triage Notes (Signed)
Pt reports having shortness of breath along with cough and runny nose for the last 3 days. Pt reports using inhaler and nebulizer without any relief.

## 2019-01-26 NOTE — Telephone Encounter (Signed)
Last script read to last 90 days. Instructed patient must wait 90 days.

## 2019-01-26 NOTE — Telephone Encounter (Signed)
Pharmacy wants to know if he can fill a script for oxycodone. She picked one up 11/14/18
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
## 2019-01-27 ENCOUNTER — Ambulatory Visit: Payer: BC Managed Care – PPO

## 2019-01-28 ENCOUNTER — Ambulatory Visit: Admission: RE | Admit: 2019-01-28 | Payer: BC Managed Care – PPO | Source: Ambulatory Visit

## 2019-02-02 ENCOUNTER — Ambulatory Visit (INDEPENDENT_AMBULATORY_CARE_PROVIDER_SITE_OTHER): Payer: BC Managed Care – PPO | Admitting: Licensed Clinical Social Worker

## 2019-02-02 DIAGNOSIS — F4312 Post-traumatic stress disorder, chronic: Secondary | ICD-10-CM

## 2019-02-02 NOTE — Progress Notes (Signed)
PT did not show or call for her appointment today. I contacted her via email and phone and there was no response.

## 2019-02-10 ENCOUNTER — Ambulatory Visit
Admission: RE | Admit: 2019-02-10 | Discharge: 2019-02-10 | Disposition: A | Discharge status: Home / Self Care | Payer: BC Managed Care – PPO | Source: Ambulatory Visit | Attending: Family Medicine | Admitting: Family Medicine

## 2019-02-10 ENCOUNTER — Other Ambulatory Visit: Payer: Self-pay

## 2019-02-10 DIAGNOSIS — Z1231 Encounter for screening mammogram for malignant neoplasm of breast: Secondary | ICD-10-CM | POA: Insufficient documentation

## 2019-02-11 ENCOUNTER — Encounter: Payer: BC Managed Care – PPO | Attending: Student | Admitting: Dietician

## 2019-02-11 ENCOUNTER — Encounter: Payer: Self-pay | Admitting: Dietician

## 2019-02-11 ENCOUNTER — Other Ambulatory Visit: Payer: Self-pay | Admitting: Family Medicine

## 2019-02-11 ENCOUNTER — Other Ambulatory Visit: Payer: Self-pay

## 2019-02-11 VITALS — Ht 62.5 in | Wt 297.2 lb

## 2019-02-11 DIAGNOSIS — R109 Unspecified abdominal pain: Secondary | ICD-10-CM

## 2019-02-11 DIAGNOSIS — N644 Mastodynia: Secondary | ICD-10-CM

## 2019-02-11 DIAGNOSIS — K760 Fatty (change of) liver, not elsewhere classified: Secondary | ICD-10-CM

## 2019-02-11 DIAGNOSIS — K529 Noninfective gastroenteritis and colitis, unspecified: Secondary | ICD-10-CM

## 2019-02-11 NOTE — Progress Notes (Signed)
Medical Nutrition Therapy: Visit start time: 0900  end time: 1000  Assessment:  Diagnosis: gastroenteritis, colitis, fatty liver, obesity Medical history changes: no changes per patient Psychosocial issues/ stress concerns: history of anxiety, depression (taking meds)  Current weight: 297.2lbs Height: 5'2.5" Medications, supplement changes: reconciled list in medical record  Progress and evaluation:   Weight has increased by about 3lbs in the past month, patient feels due to treatment with steroid medication, which has now been completed.   She continues to experience episodes of abdominal pain and diarrhea, typically soon after eating.   Patient could not recall discussion of bariatric surgery from previous visit. She is still considering weight loss surgery.   Husband has diabetes; patient reports reading food labels for carbohydrate and sugar content.  Physical activity: unable due to back, hip, leg pain  Dietary Intake:  Usual eating pattern includes 3 meals and 1-2 snacks per day. Dining out frequency: not assessed today  Breakfast: lender's blueberry bagel with 1 egg Snack: none Lunch: often small salad; 6/16 fish with green beans; sometimes skips Snack: fruit-- banana Supper: 6/16 bbq ribs + 1 chicken leg + 2-4 Tbsp cole slaw; occ baked sweet potato plain or mashed potatoes, corn only rarely; hamburger patty with gravy; chicken or pork chop in air-fryer or baked Snack: 6/16 watermelon Beverages: water; occasional soda coke 6/16; ginger ale when feeling nauseated  Nutrition Care Education: Topics covered: weight control, colitis/ GI distress Basic nutrition: basic food groups, appropriate nutrient balance, appropriate meal and snack schedule    Weight control: benefits of weight control, importance of low sugar and low fat choices; portion control especially of starchy foods, added fats; benefits of keeping food diary; role of physical activity GI distress: avoiding high  fiber foods, choosing low fat foods; consuming small meals and snacks; role of protein in healing process; role of probiotics  Nutritional Diagnosis:  [place]-1.4 Altered GI function As related to gastroenteritis, noninfective colitis, fatty liver.  As evidenced by patient with abdominal pain and frequent diarrhea. [place]-3.3 Overweight/obesity As related to excess calories, inactivity, medications.  As evidenced by patient with current BMI of 53.  Intervention:   Reviewed points to considering when deciding on bariatric surgery  Instructed on progression of diet towards lower carb intake and emphasis on lean proteins and low-carb vegetables.   Instructed on basic meal planning using plate method and menus.   Reviewed high fiber food choices to limit with colitis and gastroenteritis.   Updated goals with input from patient.  Provided dates and times for upcoming bariatric seminars.   Education Materials given:  . Plate Planner with food lists . Sample menus . Am I Ready? (AND) . Goals/ instructions  Learner/ who was taught:  . Patient   Level of understanding: Marland Kitchen Verbalizes/ demonstrates competency  Demonstrated degree of understanding via:   Teach back Learning barriers: Patient reports memory loss  Willingness to learn/ readiness for change: . Eager, change in progress  Monitoring and Evaluation:  Dietary intake, exercise, GI symptoms, and body weight      follow up: 03/18/19

## 2019-02-11 NOTE — Patient Instructions (Signed)
   Eat small meals and snacks every 3-4 hours during the day.   Avoid fruits and vegetables with skins and seeds  Limit or avoid raw vegetables like carrots, kale, iceberg lettuce.   Keep portions of starchy foods small (rice, bread, grits, oatmeal, pasta, potatoes), and continue to include a lean, lowfat source of protein with meals.   Use less mayonnaise on foods, and/or use a "light" or lowfat mayo  Next weight loss surgery  seminars in [place] will be:   Wednesday, July 22 at 5:30pm, or         Wednesday, August 5 at 5:30pm.   For dates and times in [place], call [phone]

## 2019-02-13 ENCOUNTER — Ambulatory Visit
Admission: RE | Admit: 2019-02-13 | Discharge: 2019-02-13 | Disposition: A | Discharge status: Home / Self Care | Payer: BC Managed Care – PPO | Source: Ambulatory Visit | Attending: Student | Admitting: Student

## 2019-02-13 ENCOUNTER — Other Ambulatory Visit: Payer: Self-pay

## 2019-02-13 DIAGNOSIS — K760 Fatty (change of) liver, not elsewhere classified: Secondary | ICD-10-CM | POA: Insufficient documentation

## 2019-02-13 DIAGNOSIS — K529 Noninfective gastroenteritis and colitis, unspecified: Secondary | ICD-10-CM | POA: Insufficient documentation

## 2019-02-13 DIAGNOSIS — G8929 Other chronic pain: Secondary | ICD-10-CM | POA: Insufficient documentation

## 2019-02-13 DIAGNOSIS — R109 Unspecified abdominal pain: Secondary | ICD-10-CM | POA: Insufficient documentation

## 2019-02-13 MED ORDER — IOHEXOL 300 MG/ML  SOLN
100.0000 mL | Freq: Once | INTRAMUSCULAR | Status: AC | PRN
  Administered 2019-02-13: 100 mL via INTRAVENOUS

## 2019-02-17 ENCOUNTER — Other Ambulatory Visit: Payer: Self-pay

## 2019-02-17 ENCOUNTER — Ambulatory Visit
Admission: RE | Admit: 2019-02-17 | Discharge: 2019-02-17 | Disposition: A | Discharge status: Home / Self Care | Payer: BC Managed Care – PPO | Source: Ambulatory Visit | Attending: Sports Medicine | Admitting: Sports Medicine

## 2019-02-17 DIAGNOSIS — M25551 Pain in right hip: Secondary | ICD-10-CM | POA: Diagnosis not present

## 2019-02-17 DIAGNOSIS — M25451 Effusion, right hip: Secondary | ICD-10-CM | POA: Diagnosis present

## 2019-02-17 DIAGNOSIS — M5137 Other intervertebral disc degeneration, lumbosacral region: Secondary | ICD-10-CM | POA: Insufficient documentation

## 2019-02-17 DIAGNOSIS — M51379 Other intervertebral disc degeneration, lumbosacral region without mention of lumbar back pain or lower extremity pain: Secondary | ICD-10-CM

## 2019-02-23 ENCOUNTER — Other Ambulatory Visit: Payer: Self-pay

## 2019-02-23 ENCOUNTER — Emergency Department
Admission: EM | Admit: 2019-02-23 | Discharge: 2019-02-23 | Disposition: A | Discharge status: Home / Self Care | Payer: BC Managed Care – PPO | Attending: Emergency Medicine | Admitting: Emergency Medicine

## 2019-02-23 ENCOUNTER — Emergency Department: Payer: BC Managed Care – PPO

## 2019-02-23 ENCOUNTER — Ambulatory Visit
Admission: RE | Admit: 2019-02-23 | Discharge: 2019-02-23 | Disposition: A | Discharge status: Home / Self Care | Payer: BC Managed Care – PPO | Source: Ambulatory Visit | Attending: Family Medicine | Admitting: Family Medicine

## 2019-02-23 ENCOUNTER — Telehealth: Payer: Self-pay

## 2019-02-23 DIAGNOSIS — M7989 Other specified soft tissue disorders: Secondary | ICD-10-CM

## 2019-02-23 DIAGNOSIS — N644 Mastodynia: Secondary | ICD-10-CM

## 2019-02-23 DIAGNOSIS — R2241 Localized swelling, mass and lump, right lower limb: Secondary | ICD-10-CM | POA: Diagnosis not present

## 2019-02-23 DIAGNOSIS — Z8673 Personal history of transient ischemic attack (TIA), and cerebral infarction without residual deficits: Secondary | ICD-10-CM | POA: Insufficient documentation

## 2019-02-23 DIAGNOSIS — Z79899 Other long term (current) drug therapy: Secondary | ICD-10-CM | POA: Insufficient documentation

## 2019-02-23 DIAGNOSIS — J45909 Unspecified asthma, uncomplicated: Secondary | ICD-10-CM | POA: Diagnosis not present

## 2019-02-23 DIAGNOSIS — R52 Pain, unspecified: Secondary | ICD-10-CM

## 2019-02-23 DIAGNOSIS — M79661 Pain in right lower leg: Secondary | ICD-10-CM | POA: Diagnosis present

## 2019-02-23 MED ORDER — OXYCODONE-ACETAMINOPHEN 7.5-325 MG PO TABS: 1.0000 | ORAL_TABLET | Freq: Four times a day (QID) | ORAL | 0 refills | Status: AC | PRN

## 2019-02-23 MED ORDER — FUROSEMIDE 20 MG PO TABS: 20.0000 mg | ORAL_TABLET | Freq: Every day | ORAL | 11 refills | Status: DC

## 2019-02-23 MED ORDER — OXYCODONE-ACETAMINOPHEN 5-325 MG PO TABS: 1.0000 | ORAL_TABLET | Freq: Four times a day (QID) | ORAL | Status: DC | PRN

## 2019-02-23 MED ORDER — TRAMADOL HCL 50 MG PO TABS: 50.0000 mg | ORAL_TABLET | Freq: Four times a day (QID) | ORAL | 0 refills | Status: DC | PRN

## 2019-02-23 NOTE — ED Notes (Signed)

## 2019-02-23 NOTE — ED Notes (Signed)
See triage note  Presents with pain to left lower leg  States pain is lateral  But denies any injury
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
## 2019-02-23 NOTE — Telephone Encounter (Signed)
Unable to get in contact with the patient to convert their office appt with Amy on 02/24/2019 into a mychart video visit. I left a voicemail asking the patient to return my call. Office number was provided.    If patient calls back please convert their office visit into a mychart video visit.

## 2019-02-23 NOTE — ED Triage Notes (Signed)
Pt sent from [facility] with c/o right leg swelling with pain since Saturday, pt has a hx of afib and is not on blood thinners. Denies injury

## 2019-02-24 ENCOUNTER — Telehealth (INDEPENDENT_AMBULATORY_CARE_PROVIDER_SITE_OTHER): Payer: BC Managed Care – PPO | Admitting: Family Medicine

## 2019-02-24 ENCOUNTER — Ambulatory Visit: Payer: BC Managed Care – PPO | Admitting: Adult Health

## 2019-02-24 DIAGNOSIS — G43111 Migraine with aura, intractable, with status migrainosus: Secondary | ICD-10-CM

## 2019-02-24 DIAGNOSIS — G40909 Epilepsy, unspecified, not intractable, without status epilepticus: Secondary | ICD-10-CM

## 2019-02-24 MED ORDER — ERENUMAB-AOOE 140 MG/ML ~~LOC~~ SOAJ: 140.0000 mg | SUBCUTANEOUS | 3 refills | Status: DC

## 2019-02-24 NOTE — Progress Notes (Signed)
PATIENT: TESLA KEELER DOB: 1966/07/10  REASON FOR VISIT: follow up HISTORY FROM: patient  Virtual Visit via Telephone Note  I connected with [place] on 02/24/19 at  8:00 AM EDT by telephone and verified that I am speaking with the correct person using two identifiers.   I discussed the limitations, risks, security and privacy concerns of performing an evaluation and management service by telephone and the availability of in person appointments. I also discussed with the patient that there may be a patient responsible charge related to this service. The patient expressed understanding and agreed to proceed.   History of Present Illness:  02/24/19 YEVA BISSETTE is a 53 y.o. female here today for follow up for migraines. She is seen by Dr Ginny Forth at [facility] for seizure management.  She reports having multiple seizures since last being seen by him on December 09, 2018.  She states that her last seizure was just about a week ago.  She reports having tonic-clonic activity that was noted by her husband during her sleep.  She has been advised to document these events.  She has not done so.  She reports being seen by Zacarias Pontes, ER.  There is no documentation of this.  She was, however, seen on May 6 for concerns of seizure activity.  She reports that she has taken 2 tablets (300mg ) of Lamictal every morning around 10:00.  Her prescription is written for 150 mg twice daily.  She has not reached out to Dr. Lu Duffel regarding continued seizure activity.  She reports continued migraines.  She states that these occur daily.  She is using Ajovy monthly as well as gabapentin 300mg  in the am and 600mg  in the pm.  She is using sumatriptan 50 mg tablets as well as Phenergan 25 mg as needed for abortive therapy.  She is on multiple other medications for various problems.  She has taken Percocet 7.5/325 for back pain, Klonopin 1 mg every night for anxiety and sleep.  She is also on trazodone 100 mg every  night.  She uses tizanidine 4 mg regularly for back pain.  She is seen by psychiatry.  She is on Abilify 5 mg and Prozac 40 mg. She has been on multiple preventative medications for migraine management in the past. She has also tried Botox.   HISTORY (copied from [facility] note on 07/29/2018)  Ms. Melby is a 53 year old female with a history of traumatic brain injury, migraine headaches and seizure events.  She returns today for follow-up.  She was sent to [facility] for second opinion in regards to her seizures.  She is seeing Dr. Ginny Forth there.  She states that she has had 5 seizures since  Sunday.  According to the notes Dr. Ginny Forth had advised that she should record these events and send them a video.  The patient has not yet done this.  She states that there is no loss of consciousness with these events.  States that she will begin twitching and she will fall to the floor.  No loss of bowel or bladder.  She states that she continues to use Klonopin as needed.  She is on a Ajovy for her migraines.  She reports that the frequency of her headaches have not improved but the severity has decreased slightly.  She states that he has a daily headache.  She has tried multiple oral medications in the past as well as Botox injections.  The patient states that  she was on one oral medications that work well for her.  However she does not recall that medication.  She returns today for evaluation.  HISTORY 06/18/19Ms.Rutan is a 54 year old female with a history of traumatic brain injury, migraine headaches and seizure events. She returns today for follow-up. She is currently on gabapentin, Lamictal and Topamax. She was also given a prescription of Ajovyfor her migraine headaches. She reports that starting about 1 week ago she began to have 6-8 seizures during the day. She reports that 1 of the seizureswas witnessed by her sister.they stated that her eyes rolled back in  her head and she began convulsing in the arms and the legs. She states that she has other episodes where she is briefly blacked out. She reports that her husband has told her she has 3-8 seizures at night. She has been using Klonopin. She continues on Lamictal 150 mg twice a day. She denies missing any medication. She states that she has had an"explosive migraine" for 1 week. She rates her pain a 9 out of 10. She states that her entire head hurts. She reports photophobia and nausea and vomiting. She continues on Topamax and gabapentin as well as Ajovy.She returns today for evaluation.   Observations/Objective:  Generalized: Well developed, in no acute distress  Mentation: Alert oriented to time, place, history taking. Follows all commands speech and language fluent   Assessment and Plan:  53 y.o. year old female  has a past medical history of Anginal pain ([place]), Anxiety, Asthma, Atrial fibrillation ([place]), Chest pain, Chronic back pain, Chronic bronchitis ([place]), Complication of anesthesia, Daily headache, Fibroid, GERD (gastroesophageal reflux disease), Head injury (1975; 2015), Joint pain, Migraine, Obesity, Class III, BMI 40-49.9 (morbid obesity) ([place]) (12/21/2012), Ovarian cyst, Pre-diabetes, Seizures ([place]), Shortness of breath dyspnea, TBI (traumatic brain injury) ([place]) (2015), TIA (transient ischemic attack) (04/2015?), and Vitamin D deficiency. here with    ICD-10-CM   1. Intractable migraine with aura with status migrainosus  G43.111 Erenumab-aooe 140 MG/ML SOAJ  2. Seizure disorder ([place])  G40.909    Ms. Beitz continues to have daily migraines.  She has been on Ajovy for nearly 2 years.  After discussion of preventative medication options, she wishes to try Aimovig.  She has an extensive medication list and I feel that this on of the safer options.  She reports only taking oxycodone as needed (about every other day).  Substance reporting data reveals that she filled 90 tablets of  oxycodone 5 mg on 11/16/2018.  On 02/16/2019 she filled Percocet 7.5/325 mg, 90 tablets.  She is also taking Klonopin regularly.  She continues gabapentin 300 mg in the morning and 600 mg at night.  She is using tizanidine daily for back pain.  She is using trazodone 100 mg at night for sleep.  Sumatriptan 50 mg and Phenergan 25 mg are used for migraine abortion.  We could also consider Depakote as she continues to have seizure activity.  She reports taking Lamictal 300 mg in the morning.  Her prescription is for 150 mg twice daily.  She was advised by her seizure specialist to document and record events.  This has not been done.  She has not reached out to him regarding the reported seizure at the end of June where she reports being seen by Zacarias Pontes, ER.  There is no documentation of this.  The only encounter noted was May 6.  She is adamant that another seizure occurred requiring an ER visit in June.  I  have expressed that it is imperative that she reach out to Dr. Ginny Forth today to report episodes.  Lamictal should be dosed twice a day.  She was also advised not to drive.  She will follow-up in 3 months.  She verbalizes understanding and agreement with this plan.  No orders of the defined types were placed in this encounter.   Meds ordered this encounter  Medications  . Erenumab-aooe 140 MG/ML SOAJ    Sig: Inject 140 mg into the skin every 30 (thirty) days.    Dispense:  3 pen    Refill:  3    Order Specific Question:   Supervising Provider    Answer:   Melvenia Beam [id number]     Follow Up Instructions:  I discussed the assessment and treatment plan with the patient. The patient was provided an opportunity to ask questions and all were answered. The patient agreed with the plan and demonstrated an understanding of the instructions.   The patient was advised to call back or seek an in-person evaluation if the symptoms worsen or if the condition fails to improve as anticipated.  I  provided 25 minutes of non-face-to-face time during this encounter.  Patient is located at her place of residence during video conference.  Provider is located at her place of residence.  Maryelizabeth Kaufmann, CMA helped to facilitate visit.   Debbora Presto, NP

## 2019-03-05 ENCOUNTER — Encounter (HOSPITAL_COMMUNITY): Payer: Self-pay | Admitting: Licensed Clinical Social Worker

## 2019-03-05 ENCOUNTER — Ambulatory Visit (INDEPENDENT_AMBULATORY_CARE_PROVIDER_SITE_OTHER): Payer: BC Managed Care – PPO | Admitting: Licensed Clinical Social Worker

## 2019-03-05 DIAGNOSIS — F4312 Post-traumatic stress disorder, chronic: Secondary | ICD-10-CM

## 2019-03-05 DIAGNOSIS — S069X1S Unspecified intracranial injury with loss of consciousness of 30 minutes or less, sequela: Secondary | ICD-10-CM
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
## 2019-03-16 ENCOUNTER — Ambulatory Visit (INDEPENDENT_AMBULATORY_CARE_PROVIDER_SITE_OTHER): Payer: BC Managed Care – PPO | Admitting: Licensed Clinical Social Worker

## 2019-03-16 ENCOUNTER — Other Ambulatory Visit: Payer: Self-pay | Admitting: Neurology

## 2019-03-16 DIAGNOSIS — F4312 Post-traumatic stress disorder, chronic: Secondary | ICD-10-CM | POA: Diagnosis not present

## 2019-03-18 ENCOUNTER — Encounter: Payer: BC Managed Care – PPO | Attending: Student | Admitting: Dietician

## 2019-03-18 ENCOUNTER — Other Ambulatory Visit: Payer: Self-pay

## 2019-03-18 ENCOUNTER — Encounter: Payer: Self-pay | Admitting: Dietician

## 2019-03-18 VITALS — Ht 62.5 in | Wt 298.0 lb

## 2019-03-18 DIAGNOSIS — K529 Noninfective gastroenteritis and colitis, unspecified: Secondary | ICD-10-CM | POA: Diagnosis not present

## 2019-03-18 DIAGNOSIS — R109 Unspecified abdominal pain: Secondary | ICD-10-CM

## 2019-03-18 DIAGNOSIS — K760 Fatty (change of) liver, not elsewhere classified: Secondary | ICD-10-CM | POA: Diagnosis not present

## 2019-03-18 DIAGNOSIS — E66813 Obesity, class 3: Secondary | ICD-10-CM

## 2019-03-18 DIAGNOSIS — Z6841 Body Mass Index (BMI) 40.0 and over, adult: Secondary | ICD-10-CM

## 2019-03-18 NOTE — Patient Instructions (Signed)
   Continue to eat something at least every 4-5 hours during the day. Your snack with protein and some fruit is a healthy choice to avoid a long stretch of time between meals.   Good job controlling carb intake with meals, continue to keep portions small and make some whole grain choices.

## 2019-03-18 NOTE — Progress Notes (Signed)
Medical Nutrition Therapy: Visit start time: 1610  end time: 1720 Assessment:  Diagnosis: noninfectious gastroenteritis, colitis, fatty liver; obesity Medical history changes: no changes per patient Psychosocial issues/ stress concerns: history of anxiety, depression -- takes meds and sees therapist every 2 weeks  Current weight: 298.0lbs Height: 5'2.5" Medications, supplement changes: reconciled list in medical record  Progress and evaluation:   Patient has been reducing fat intake by using less mayonnaise on foods, but still progressing gradually.    She reports improvement with abdominal pain and diarrhea, no recent episodes.   Weight has increased by 0.8lbs since previous visit on 02/11/19, despite patient's efforts to reduce calories.   Physical activity: limited due to back and hip pain  Dietary Intake:  Usual eating pattern includes 2 meals and 1-2 snacks per day. Dining out frequency: 2-3 meals per week.  Breakfast: blueberry bagel with 1 egg Snack: none Lunch: small salad; chicken salad and tomatoes; sometimes skips due to lack of hunger Snack: balance break pkg snack with nuts, cheese, dried fruit Supper: sometimes none if lunch was eaten; if no lunch then will have a meal ie-- hamburger patty with gravy and green beans no potato or other starch; baked chicken with brown rice and cabbage; sometimes blend of peas and corn, likes pinto beans, lima beans, baked beans Snack: watermelon or cherries, or peach; pum[place] in the winter Beverages: water-- has insulated jug that holds 5c water, drinks 3x a day; occasional apple juice or diet cranberry juice  Nutrition Care Education: Topics covered: weight control, GI health   Weight control: food and no-food factors that can affect weight loss; appropriate nutrient balance; importance of adequate nutritional intake; ongoing control of carb intake  GI health: gradual increase in fiber intake with improved symptoms Other: importance of  healthy stress management strategies to avoid emotional eating; bariatric surgery program and diet   Nutritional Diagnosis:  [place]-1.4 Altered GI function As related to history of gastroenteritis and colitis as well as fatty liver.  As evidenced by patient with history of abdominal pain and diarrhea, now improved. [place]-3.3 Overweight/obesity As related to history of excess calories, inactivity.  As evidenced by patient with current BMI of 53, following calorie-controlled diet for weight loss.  Intervention:   Instruction and discussion as noted above.  Patient has been making positive diet changes with no weight loss yet.   She will continue with emphasis on protein and vegetable intake while limiting amounts of carbohydrate foods.   Education Materials given:  Mar[place] Kitchen Goals/ instructions   Learner/ who was taught:  . Patient    Level of understanding: Mar[place] Kitchen Verbalizes/ demonstrates competency   Demonstrated degree of understanding via:   Teach back Learning barriers: . None  Willingness to learn/ readiness for change: . Eager, change in progress   Monitoring and Evaluation:  Dietary intake, exercise, GI symptoms, and body weight      follow up: 04/16/19 at 4:45pm

## 2019-03-23 ENCOUNTER — Encounter (HOSPITAL_COMMUNITY): Payer: Self-pay | Admitting: Licensed Clinical Social Worker

## 2019-03-23 NOTE — Progress Notes (Signed)
Virtual Visit via Video Note  I connected with [name] [name] on 03/23/19 at 10:00 AM EDT by a video enabled telemedicine application and verified that I am speaking with the correct person using two identifiers.  Location: Patient: Home Provider: Office   I discussed the limitations of evaluation and management by telemedicine and the availability of in person appointments. The patient expressed understanding and agreed to proceed.    I discussed the assessment and treatment plan with the patient. The patient was provided an opportunity to ask questions and all were answered. The patient agreed with the plan and demonstrated an understanding of the instructions.   The patient was advised to call back or seek an in-person evaluation if the symptoms worsen or if the condition fails to improve as anticipated.  I provided 52 minutes of non-face-to-face time during this encounter.   Archie Balboa, LCAS-A    THERAPIST PROGRESS NOTE  Session Time: 10-11  Participation Level: Active  Behavioral Response: CasualAlertDysphoric  Type of Therapy: Individual Therapy  Treatment Goals addressed: Anxiety  Interventions: CBT, Supportive and Meditation: Mindfulness Meditation  Summary: [name] [name] is a 53 y.o. female who presents with hx of PTSD, anxiety, and depression. She reports she continues to experience feelings of anger and irritability. She continues to be afraid of water. Counselor works w/ PT on exposure therapy using intervention of imagining a scenario in which she is in a room w/ a pool. PT is asked to notice and report on her somatic experience of the imagined exposure. PT is agreeable to this and participates actively.   Suicidal/Homicidal: Nowithout intent/plan  Therapist Response: I used open questions, active listening, and reflection. I helped PT begin exposure therapy using imagination. PT states she would like to be more comfortable w/ water since she could spend  more time playing w/ her grandchildren at pool.   Plan: Return again in 2 weeks.  Diagnosis:    ICD-10-CM   1. Chronic post-traumatic stress disorder (PTSD)  F43.12      Archie Balboa, LCAS-A 03/23/2019

## 2019-03-31 ENCOUNTER — Ambulatory Visit (INDEPENDENT_AMBULATORY_CARE_PROVIDER_SITE_OTHER): Payer: BC Managed Care – PPO | Admitting: Licensed Clinical Social Worker

## 2019-03-31 ENCOUNTER — Other Ambulatory Visit: Payer: Self-pay

## 2019-03-31 ENCOUNTER — Encounter (HOSPITAL_COMMUNITY): Payer: Self-pay | Admitting: Licensed Clinical Social Worker

## 2019-03-31 DIAGNOSIS — F4312 Post-traumatic stress disorder, chronic: Secondary | ICD-10-CM

## 2019-03-31 NOTE — Progress Notes (Signed)
Virtual Visit via Video Note  I connected with [name] [name] Seat on 03/31/19 at 10:00 AM EDT by a video enabled telemedicine application and verified that I am speaking with the correct person using two identifiers.  Location: Patient: Home Provider: Office   I discussed the limitations of evaluation and management by telemedicine and the availability of in person appointments. The patient expressed understanding and agreed to proceed.    I discussed the assessment and treatment plan with the patient. The patient was provided an opportunity to ask questions and all were answered. The patient agreed with the plan and demonstrated an understanding of the instructions.   The patient was advised to call back or seek an in-person evaluation if the symptoms worsen or if the condition fails to improve as anticipated.  I provided 52 minutes of non-face-to-face time during this encounter.   Archie Balboa, LCAS-A    THERAPIST PROGRESS NOTE  Session Time: 10-11  Participation Level: Active  Behavioral Response: Well GroomedAlertEuthymic  Type of Therapy: Individual Therapy  Treatment Goals addressed: Anxiety  Interventions: CBT, Motivational Interviewing and Supportive  Summary: [name] [name] is a 53 y.o. female who presents with hx of chronic PTSD and phobia of pools. We discuss the nature of PT's feelings towards pools, her frustration at herself and [employer]. I work to redirect PT's externalizing blame and instead ask her to focus on her own emotions towards herself. She discusses her fear of pools and how she wishes she could get over it. When we discuss the question of "why did this happen to me", she relates it to her still born daughter. We work on a plan for PT to gradually introduce hereself to the pool by dipping her foot in while holding the hand of a loved one.   Suicidal/Homicidal: Nowithout intent/plan  Therapist Response: I used open questions, active listening, and  imaginary exposure therapy. I worked to resolve ambivalence using MI and evoking PT's own motivation to work on her phobia.   Plan: Return again in 2 weeks.  Diagnosis:    ICD-10-CM   1. Chronic post-traumatic stress disorder (PTSD)  F43.12      Archie Balboa, LCAS-A 03/31/2019

## 2019-04-01 ENCOUNTER — Encounter: Payer: Self-pay | Admitting: Student in an Organized Health Care Education/Training Program

## 2019-04-02 ENCOUNTER — Ambulatory Visit
Payer: BC Managed Care – PPO | Attending: Student in an Organized Health Care Education/Training Program | Admitting: Student in an Organized Health Care Education/Training Program

## 2019-04-02 ENCOUNTER — Encounter: Payer: Self-pay | Admitting: Student in an Organized Health Care Education/Training Program

## 2019-04-02 ENCOUNTER — Other Ambulatory Visit: Payer: Self-pay

## 2019-04-02 DIAGNOSIS — M7918 Myalgia, other site: Secondary | ICD-10-CM | POA: Diagnosis not present

## 2019-04-02 DIAGNOSIS — M7062 Trochanteric bursitis, left hip: Secondary | ICD-10-CM

## 2019-04-02 DIAGNOSIS — M533 Sacrococcygeal disorders, not elsewhere classified: Secondary | ICD-10-CM

## 2019-04-02 DIAGNOSIS — M47818 Spondylosis without myelopathy or radiculopathy, sacral and sacrococcygeal region: Secondary | ICD-10-CM

## 2019-04-02 DIAGNOSIS — G8929 Other chronic pain: Secondary | ICD-10-CM

## 2019-04-02 DIAGNOSIS — G894 Chronic pain syndrome: Secondary | ICD-10-CM

## 2019-04-02 NOTE — Progress Notes (Signed)
Pain Management Virtual Encounter Note - Virtual Visit via Telephone Telehealth (real-time audio visits between healthcare provider and patient).   Patient's Phone No. & Preferred Pharmacy:  [phone] (home); [phone] (mobile); (Preferred) [phone] [email or web address]  [id number] [address], [place] [address] Phone: [phone] Fax: [phone]  [id number] - [place], [place] [address] [place] [address] Phone: [phone] Fax: [phone]    Pre-screening note:  Our staff contacted [name] [name] and offered her an "in person", "face-to-face" appointment versus a telephone encounter. She indicated preferring the telephone encounter, at this time.   Reason for Virtual Visit: COVID-19*  Social distancing based on CDC and AMA recommendations.   I contacted Lili Harts Ambler on 04/02/2019 via telephone.      I clearly identified myself as [name], MD. I verified that I was speaking with the correct person using two identifiers (Name: [name] [name], and date of birth: 10-11-1965).  Advanced Informed Consent I sought verbal advanced consent from [name] [name] for virtual visit interactions. I informed [name] [name] of possible security and privacy concerns, risks, and limitations associated with providing "not-in-person" medical evaluation and management services. I also informed [name] [name] of the availability of "in-person" appointments. Finally, I informed her that there would be a charge for the virtual visit and that she could be  personally, fully or partially, financially responsible for it. [name] [name] expressed understanding and agreed to proceed.   Historic Elements   [name] [name] is a 53 y.o. year old, female patient evaluated today after her last encounter by our practice on 01/26/2019. [name] [name]  has a past medical  history of Anginal pain ([place]), Anxiety, Asthma, Atrial fibrillation ([place]), Chest pain, Chronic back pain, Chronic bronchitis ([place]), Complication of anesthesia, Daily headache, Fibroid, GERD (gastroesophageal reflux disease), Head injury (1975; 2015), Joint pain, Migraine, Obesity, Class III, BMI 40-49.9 (morbid obesity) ([place]) (12/21/2012), Ovarian cyst, Pre-diabetes, Seizures ([place]), Shortness of breath dyspnea, TBI (traumatic brain injury) ([place]) (2015), TIA (transient ischemic attack) (04/2015?), and Vitamin D deficiency. She also  has a past surgical history that includes Cesarean section (1989); Esophagogastroduodenoscopy (N/A, 11/21/2012); Colonoscopy (N/A, 11/21/2012); Incisional hernia repair (N/A, 12/17/2012); Insertion of mesh (N/A, 12/17/2012); Cardiac catheterization (N/A, 01/21/2015); Appendectomy (1990s); Hernia repair; Dilation and curettage of uterus (1985); Ovarian cyst removal (2007; 2014); Abdominal hysterectomy (2005); Colonoscopy with propofol (N/A, 09/02/2017); and Esophagogastroduodenoscopy (egd) with propofol (N/A, 09/02/2017). [name] [name] has a current medication list which includes the following prescription(s): albuterol (2.5 MG/3ML) 0.083% NEBU 3 mL, albuterol (5 MG/ML) 0.5% NEBU 0.5 mL, albuterol, aripiprazole, benzonatate, brompheniramine-pseudoephedrine-dm, cetirizine, clonazepam, diclofenac sodium, aimovig, estradiol, fluticasone, furosemide, gabapentin, guaifenesin, hydrocortisone, hyoscyamine, ibuprofen, lamotrigine, levalbuterol, meclizine, metoprolol tartrate, montelukast, omeprazole, oxycodone-acetaminophen, potassium chloride sa, promethazine, sumatriptan, tizanidine, trazodone, cetirizine hcl, dicyclomine, fluoxetine, furosemide, prednisone, and vitamin d (ergocalciferol). She  reports that she has never smoked. She has never used smokeless tobacco. She reports previous alcohol use. She reports that she does not use drugs. [name] [name] is allergic to amoxicillin; penicillins; ampicillin; biaxin  [clarithromycin]; erythromycin base; tramadol hcl; and zithromax [azithromycin].   HPI  Today, she is being contacted for worsening of previously known (established) problem  Since patient's last visit with me, she has had increased right hip pain.  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  She  did meet with Dr. Earle Gell, with orthopedics.  Given her body habitus and her chronic health conditions, it was recommended that she lose weight before considering surgery.  Patient states that she is having pain in both her left and right hip and SI joints.  Patient's last SI joint injection was in March and we discussed repeating to help her SI joint related pain that is also radiating and causing hip pain and occasional groin pain.  Risks and benefits of this procedure were reviewed and we will proceed.  UDS:  Summary  Date Value Ref Range Status  08/04/2018 FINAL  Final    Comment:    ==================================================================== TOXASSURE SELECT 13 (MW) ==================================================================== Test                             Result       Flag       Units Drug Present not Declared for Prescription Verification   7-aminoclonazepam              50           UNEXPECTED ng/mg creat    7-aminoclonazepam is an expected metabolite of clonazepam. Source    of clonazepam is a scheduled prescription medication.   Oxycodone                      116          UNEXPECTED ng/mg creat   Noroxycodone                   874          UNEXPECTED ng/mg creat    Sources of oxycodone include scheduled prescription medications.    Noroxycodone is an expected metabolite of oxycodone. Drug Absent but Declared for Prescription Verification   Diazepam                       Not Detected UNEXPECTED ng/mg creat ==================================================================== Test                       Result    Flag   Units      Ref Range   Creatinine              133              mg/dL      >=20 ==================================================================== Declared Medications:  The flagging and interpretation on this report are based on the  following declared medications.  Unexpected results may arise from  inaccuracies in the declared medications.  **Note: The testing scope of this panel includes these medications:  Diazepam (Valium)  **Note: The testing scope of this panel does not include following  reported medications:  Aspirin (Aspirin 81)  Atenolol (Tenormin)  Cholecalciferol  Estradiol (Estrace)  Furosemide (Lasix)  Ibuprofen  Levothyroxine (Synthroid)  LIOTHYRONINE (Cytomel)  Melatonin  Modafinil (Provigil)  Multivitamin (MVI)  Polyethylene Glycol (MiraLAX)  Potassium (K-Dur)  Pramipexole (Mirapex)  Promethazine (Phenergan)  Topical  Zolpidem (Ambien) ==================================================================== For clinical consultation, please call [phone]. ====================================================================    Laboratory Chemistry Profile (12 mo)  Renal: 01/26/2019: BUN 14; Creatinine, Ser 0.77  Lab Results  Component Value Date   GFRAA >60 01/26/2019   GFRNONAA >60 01/26/2019   Hepatic: 12/25/2018: Albumin 3.9 Lab Results  Component Value Date   AST 18 12/25/2018   ALT 16 12/25/2018   Other:  No results found for requested labs within last 8760 hours. Note: Above Lab results reviewed.  Imaging  Last 90 days:  Dg Tibia/fibula Right  Result Date: 02/23/2019 CLINICAL DATA:  Right lower leg pain for 3 days.  No known injury. EXAM: RIGHT TIBIA AND FIBULA - 2 VIEW COMPARISON:  None. FINDINGS: There is no evidence of fracture or other focal bone lesions. Soft tissues are unremarkable. IMPRESSION: Negative. Electronically Signed   By: Lorriane Shire M.D.   On: 02/23/2019 17:11   Ct Abdomen Pelvis W  Contrast  Result Date: 02/13/2019 CLINICAL DATA:  Generalized abdominal pain with intermittent nausea, vomiting, and diarrhea for 2 years. EXAM: CT ABDOMEN AND PELVIS WITH CONTRAST TECHNIQUE: Multidetector CT imaging of the abdomen and pelvis was performed using the standard protocol following bolus administration of intravenous contrast. CONTRAST:  185mL OMNIPAQUE IOHEXOL 300 MG/ML  SOLN COMPARISON:  03/22/2017 FINDINGS: Lower chest: The visualized lung bases are clear. Hepatobiliary: No focal liver abnormality is seen. No gallstones, gallbladder wall thickening, or biliary dilatation. Pancreas: Unremarkable. Spleen: Unremarkable. Adrenals/Urinary Tract: Unremarkable adrenal glands. Subcentimeter low-density lesions in the kidneys, too small to fully characterize. No evidence of renal calculi or hydronephrosis. Unremarkable bladder. Stomach/Bowel: The stomach is unremarkable. There is no evidence of bowel obstruction or inflammation. A moderate amount of stool is noted in the proximal colon. Prior appendectomy. Vascular/Lymphatic: No significant vascular findings are present. No enlarged abdominal or pelvic lymph nodes. Reproductive: Status post hysterectomy. No adnexal masses. Other: No intraperitoneal free fluid. Unchanged appearance of the anterior abdominal wall status post ventral hernia repair. Musculoskeletal: No acute osseous abnormality or suspicious osseous lesion. IMPRESSION: No acute abnormality identified in the abdomen or pelvis. Electronically Signed   By: Logan Bores M.D.   On: 02/13/2019 15:05   Mr Hip Right Wo Contrast  Result Date: 02/17/2019 CLINICAL DATA:  Chronic right hip pain. EXAM: MR OF THE RIGHT HIP WITHOUT CONTRAST TECHNIQUE: Multiplanar, multisequence MR imaging was performed. No intravenous contrast was administered. COMPARISON:  CT scan of the abdomen and pelvis dated 02/13/2019 and hip radiographs dated 12/25/2018 FINDINGS: Bones: There is edema in the superior medial aspect  of the right acetabulum as well as edema in the anterior superior aspect of the right femoral head and neck. There is slight flattening of the contour of the femoral head best seen on image 13 of series 15. Slight arthritic changes at the anterior inferior aspect of the right SI joint. No other significant bone abnormality. Articular cartilage and labrum Articular cartilage: Diffuse thinning of the articular cartilage of the right hip. Labrum:  No discrete labral tear. Joint or bursal effusion Joint effusion:  Trace right hip effusion. Bursae: No greater trochanteric bursitis Muscles and tendons Muscles and tendons: Slight edema in the distal right gluteus maximus muscle deep to the iliotibial band best seen on image 21 of series 12. There is a similar appearance at the opposite hip. Other findings Miscellaneous:   None IMPRESSION: 1. Arthritic changes of the right hip with suggestion of early avascular necrosis of the right femoral head. 2. Bilateral slight inflammation deep to the iliotibial bands of both hips, right more than left. Electronically Signed   By: Lorriane Shire M.D.   On: 02/17/2019 10:56   US Venous Img Lower Unilateral Right  Result Date: 02/23/2019 CLINICAL DATA:  Right lower extremity pain and swelling EXAM: RIGHT LOWER EXTREMITY VENOUS DOPPLER ULTRASOUND TECHNIQUE: Gray-scale sonography with graded compression, as well as color Doppler and duplex ultrasound were performed  to evaluate the lower extremity deep venous systems from the level of the common femoral vein and including the common femoral, femoral, profunda femoral, popliteal and calf veins including the posterior tibial, peroneal and gastrocnemius veins when visible. The superficial great saphenous vein was also interrogated. Spectral Doppler was utilized to evaluate flow at rest and with distal augmentation maneuvers in the common femoral, femoral and popliteal veins. COMPARISON:  None. FINDINGS: Contralateral Common Femoral Vein:  Respiratory phasicity is normal and symmetric with the symptomatic side. No evidence of thrombus. Normal compressibility. Common Femoral Vein: No evidence of thrombus. Normal compressibility, respiratory phasicity and response to augmentation. Saphenofemoral Junction: No evidence of thrombus. Normal compressibility and flow on color Doppler imaging. Profunda Femoral Vein: No evidence of thrombus. Normal compressibility and flow on color Doppler imaging. Femoral Vein: No evidence of thrombus. Normal compressibility, respiratory phasicity and response to augmentation. Popliteal Vein: No evidence of thrombus. Normal compressibility, respiratory phasicity and response to augmentation. Calf Veins: No evidence of thrombus. Normal compressibility and flow on color Doppler imaging. Superficial Great Saphenous Vein: No evidence of thrombus. Normal compressibility. Venous Reflux:  Not assessed Other Findings:  None. IMPRESSION: No evidence of deep venous thrombosis. Electronically Signed   By: Jerilynn Mages.  Shick M.D.   On: 02/23/2019 16:20   Dg Chest Portable 1 View  Result Date: 01/26/2019 CLINICAL DATA:  53 year old female with shortness of breath. EXAM: PORTABLE CHEST 1 VIEW COMPARISON:  Chest radiograph dated 09/20/2018 FINDINGS: The heart size and mediastinal contours are within normal limits. Both lungs are clear. The visualized skeletal structures are unremarkable. IMPRESSION: No active disease. Electronically Signed   By: Anner Crete M.D.   On: 01/26/2019 02:56   US Breast Ltd Uni Left Inc Axilla  Result Date: 02/23/2019 CLINICAL DATA:  Patient presents for bilateral diagnostic examination due to focal pain over the upper outer left breast for 1-2 months. Patient is due for her annual bilateral mammogram. Patient is on estradiol for the past 4 years. EXAM: DIGITAL DIAGNOSTIC bilateral MAMMOGRAM WITH CAD AND TOMO ULTRASOUND left BREAST COMPARISON:  Previous exam(s). ACR Breast Density Category b: There are scattered  areas of fibroglandular density. FINDINGS: Examination demonstrates no focal abnormality over the upper outer left breast to account for patient's pain. Left breast is otherwise unchanged from previous exams. Right breast is unchanged. Mammographic images were processed with CAD. Targeted ultrasound is performed, showing no focal abnormality over the upper outer quadrant of the left breast to account for patient's pain. A normal intramammary lymph node is noted over the 3 o'clock position 14 cm from the nipple. IMPRESSION: No focal abnormality over the upper outer left breast to account for patient's focal pain. Incidentally noted is a normal intramammary lymph node over the 3 o'clock position 14 cm from the nipple. RECOMMENDATION: Recommend continued management of patient's focal left breast pain on a clinical basis. Otherwise, recommend continued annual bilateral screening mammographic follow-up. I have discussed the findings and recommendations with the patient. Results were also provided in writing at the conclusion of the visit. If applicable, a reminder letter will be sent to the patient regarding the next appointment. BI-RADS CATEGORY  2: Benign. Electronically Signed   By: Marin Olp M.D.   On: 02/23/2019 10:58   Mm Diag Breast Tomo Bilateral  Result Date: 02/23/2019 CLINICAL DATA:  Patient presents for bilateral diagnostic examination due to focal pain over the upper outer left breast for 1-2 months. Patient is due for her annual bilateral mammogram. Patient is on  estradiol for the past 4 years. EXAM: DIGITAL DIAGNOSTIC bilateral MAMMOGRAM WITH CAD AND TOMO ULTRASOUND left BREAST COMPARISON:  Previous exam(s). ACR Breast Density Category b: There are scattered areas of fibroglandular density. FINDINGS: Examination demonstrates no focal abnormality over the upper outer left breast to account for patient's pain. Left breast is otherwise unchanged from previous exams. Right breast is unchanged.  Mammographic images were processed with CAD. Targeted ultrasound is performed, showing no focal abnormality over the upper outer quadrant of the left breast to account for patient's pain. A normal intramammary lymph node is noted over the 3 o'clock position 14 cm from the nipple. IMPRESSION: No focal abnormality over the upper outer left breast to account for patient's focal pain. Incidentally noted is a normal intramammary lymph node over the 3 o'clock position 14 cm from the nipple. RECOMMENDATION: Recommend continued management of patient's focal left breast pain on a clinical basis. Otherwise, recommend continued annual bilateral screening mammographic follow-up. I have discussed the findings and recommendations with the patient. Results were also provided in writing at the conclusion of the visit. If applicable, a reminder letter will be sent to the patient regarding the next appointment. BI-RADS CATEGORY  2: Benign. Electronically Signed   By: Marin Olp M.D.   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 Diagnoses of Chronic SI joint pain, SI (sacroiliac) joint dysfunction, Myofascial pain, Trochanteric bursitis of left hip, and Chronic pain syndrome were also pertinent to this visit.  Plan of Care  I am having [name] [name] maintain her Vitamin D (Ergocalciferol), omeprazole, cetirizine, FLUoxetine, clonazePAM, SUMAtriptan, estradiol, diclofenac sodium, metoprolol tartrate, fluticasone, promethazine, meclizine, hyoscyamine, ARIPiprazole, hydrocortisone, ibuprofen, traZODone, gabapentin, tiZANidine, oxyCODONE-acetaminophen, albuterol, dicyclomine, levalbuterol, (albuterol (2.5 MG/3ML) 0.083% NEBU 3 mL, albuterol (5 MG/ML) 0.5% NEBU 0.5 mL), guaiFENesin, predniSONE, Cetirizine HCl, benzonatate, furosemide, lamoTRIgine, Aimovig, potassium chloride SA, furosemide, montelukast, and brompheniramine-pseudoephedrine-DM.  Orders:  Orders Placed This  Encounter  Procedures  . SACROILIAC JOINT INJECTION    Standing Status:   Future    Standing Expiration Date:   05/03/2019    Scheduling Instructions:     Side: Bilateral     Sedation: WITH     Timeframe: ASAP    Order Specific Question:   Where will this procedure be performed?    Answer:   [facility]   Follow-up plan:   Return in about 1 week (around 04/09/2019) for Procedure B/L SI-J with sedation.     Previous SI joint injection on 11/10/2018, helpful.  Has seen orthopedics had right hip MRI done which shows right hip osteoarthritis and early avascular necrosis of right femoral head.  Surgery offered but patient high risk given her morbid obesity and chronic opioid therapy.   Recent Visits No visits were found meeting these conditions.  Showing recent visits within past 90 days and meeting all other requirements   Today's Visits Date Type Provider Dept  04/02/19 Office Visit [name], MD [facility]  Showing today's visits and meeting all other requirements   Future Appointments No visits were found meeting these conditions.  Showing future appointments within next 90 days and meeting all other requirements   I discussed the assessment and treatment plan with the patient. The patient was provided an opportunity to ask questions and all were answered. The patient agreed with the plan and demonstrated an understanding of the instructions.  Patient advised to call back or seek an in-person evaluation if  the symptoms or condition worsens.  Total duration of non-face-to-face encounter: 15 minutes.  Note by: [name], MD Date: 04/02/2019; Time: 11:01 AM  Note: This dictation was prepared with Dragon dictation. Any transcriptional errors that may result from this process are unintentional.  Disclaimer:  * Given the special circumstances of the COVID-19 pandemic, the federal government has announced that the Office for Civil Rights (OCR) will exercise its  enforcement discretion and will not impose penalties on physicians using telehealth in the event of noncompliance with regulatory requirements under the [place] (HIPAA) in connection with the good faith provision of telehealth during the JMEQA-83 national public health emergency. ([place])

## 2019-04-06 ENCOUNTER — Encounter: Payer: Self-pay | Admitting: Student in an Organized Health Care Education/Training Program

## 2019-04-06 ENCOUNTER — Ambulatory Visit
Admission: RE | Admit: 2019-04-06 | Discharge: 2019-04-06 | Disposition: A | Discharge status: Home / Self Care | Payer: BC Managed Care – PPO | Source: Ambulatory Visit | Attending: Student in an Organized Health Care Education/Training Program | Admitting: Student in an Organized Health Care Education/Training Program

## 2019-04-06 ENCOUNTER — Other Ambulatory Visit: Payer: Self-pay

## 2019-04-06 ENCOUNTER — Ambulatory Visit (HOSPITAL_BASED_OUTPATIENT_CLINIC_OR_DEPARTMENT_OTHER): Payer: BC Managed Care – PPO | Admitting: Student in an Organized Health Care Education/Training Program

## 2019-04-06 VITALS — BP 130/59 | HR 66 | Temp 98.7°F | Resp 16 | Ht 62.0 in | Wt 290.0 lb

## 2019-04-06 DIAGNOSIS — M47818 Spondylosis without myelopathy or radiculopathy, sacral and sacrococcygeal region: Secondary | ICD-10-CM | POA: Insufficient documentation

## 2019-04-06 DIAGNOSIS — M25512 Pain in left shoulder: Secondary | ICD-10-CM | POA: Diagnosis present

## 2019-04-06 DIAGNOSIS — G8929 Other chronic pain: Secondary | ICD-10-CM

## 2019-04-06 DIAGNOSIS — M533 Sacrococcygeal disorders, not elsewhere classified: Secondary | ICD-10-CM

## 2019-04-06 DIAGNOSIS — M25511 Pain in right shoulder: Secondary | ICD-10-CM | POA: Diagnosis present

## 2019-04-06 MED ORDER — ROPIVACAINE HCL 2 MG/ML IJ SOLN: 2.0000 mL | Freq: Once | INTRAMUSCULAR | Status: DC

## 2019-04-06 MED ORDER — DEXAMETHASONE SODIUM PHOSPHATE 10 MG/ML IJ SOLN
INTRAMUSCULAR | Status: AC
  Filled 2019-04-06: qty 2

## 2019-04-06 MED ORDER — LIDOCAINE HCL 2 % IJ SOLN
INTRAMUSCULAR | Status: AC
  Filled 2019-04-06: qty 20

## 2019-04-06 MED ORDER — ROPIVACAINE HCL 2 MG/ML IJ SOLN: 1.0000 mL | Freq: Once | INTRAMUSCULAR | Status: DC

## 2019-04-06 MED ORDER — IOHEXOL 180 MG/ML  SOLN: 10.0000 mL | Freq: Once | INTRAMUSCULAR | Status: DC

## 2019-04-06 MED ORDER — DEXAMETHASONE SODIUM PHOSPHATE 10 MG/ML IJ SOLN
10.0000 mg | Freq: Once | INTRAMUSCULAR | Status: AC
  Administered 2019-04-06: 12:00:00 10 mg

## 2019-04-06 MED ORDER — FENTANYL CITRATE (PF) 100 MCG/2ML IJ SOLN: 25.0000 ug | INTRAMUSCULAR | Status: DC | PRN

## 2019-04-06 MED ORDER — LIDOCAINE HCL 2 % IJ SOLN
20.0000 mL | Freq: Once | INTRAMUSCULAR | Status: AC
  Administered 2019-04-06: 400 mg

## 2019-04-06 MED ORDER — ROPIVACAINE HCL 2 MG/ML IJ SOLN
INTRAMUSCULAR | Status: AC
  Filled 2019-04-06: qty 20

## 2019-04-06 MED ORDER — FENTANYL CITRATE (PF) 100 MCG/2ML IJ SOLN
INTRAMUSCULAR | Status: AC
  Filled 2019-04-06: qty 2

## 2019-04-06 NOTE — Progress Notes (Signed)
Safety precautions to be maintained throughout the outpatient stay will include: orient to surroundings, keep bed in low position, maintain call bell within reach at all times, provide assistance with transfer out of bed and ambulation.  

## 2019-04-06 NOTE — Progress Notes (Signed)
Patient's Name: [name] [name]  MRN: [id number]  Referring Provider: [name], MD  DOB: 1966/05/27  PCP: [name], MD  DOS: 04/06/2019  Note by: [name], MD  Service setting: Ambulatory outpatient  Specialty: Interventional Pain Management  Patient type: Established  Location: [facility] (AMB) Pain Management Facility  Visit type: Interventional Procedure   Primary Reason for Visit: Interventional Pain Management Treatment. CC: Back Pain (low) and Hip Pain (bilateral)  Procedure:          Anesthesia, Analgesia, Anxiolysis:  Type: Therapeutic Sacroiliac Joint Steroid Injection          Region: Inferior Lumbosacral Region Level: PIIS (Posterior Inferior Iliac Spine) Laterality: Bilateral  Type: Moderate (Conscious) Sedation combined with Local Anesthesia Indication(s): Analgesia and Anxiety Route: Intravenous (IV) IV Access: Secured Sedation: Meaningful verbal contact was maintained at all times during the procedure  Local Anesthetic: Lidocaine 1-2%  Position: Prone           Indications: 1. SI joint arthritis   2. Chronic SI joint pain   3. SI (sacroiliac) joint dysfunction   4. Chronic pain of both shoulders    Pain Score: Pre-procedure: 9 /10 Post-procedure: 0-No pain/10  Pre-op Assessment:  Ms. Lichty is a 53 y.o. (year old), female patient, seen today for interventional treatment. She  has a past surgical history that includes Cesarean section (1989); Esophagogastroduodenoscopy (N/A, 11/21/2012); Colonoscopy (N/A, 11/21/2012); Incisional hernia repair (N/A, 12/17/2012); Insertion of mesh (N/A, 12/17/2012); Cardiac catheterization (N/A, 01/21/2015); Appendectomy (1990s); Hernia repair; Dilation and curettage of uterus (1985); Ovarian cyst removal (2007; 2014); Abdominal hysterectomy (2005); Colonoscopy with propofol (N/A, 09/02/2017); and Esophagogastroduodenoscopy (egd) with propofol (N/A, 09/02/2017). Ms. Meding has a current medication list which includes the following  prescription(s): albuterol (2.5 MG/3ML) 0.083% NEBU 3 mL, albuterol (5 MG/ML) 0.5% NEBU 0.5 mL, albuterol, aripiprazole, cetirizine, clonazepam, diclofenac sodium, aimovig, estradiol, fluoxetine, fluticasone, gabapentin, hydrocortisone, ibuprofen, lamotrigine, levalbuterol, meclizine, metoprolol tartrate, montelukast, omeprazole, oxycodone-acetaminophen, potassium chloride sa, promethazine, sumatriptan, tizanidine, trazodone, vitamin d (ergocalciferol), benzonatate, brompheniramine-pseudoephedrine-dm, cetirizine hcl, dicyclomine, furosemide, furosemide, guaifenesin, hyoscyamine, and prednisone, and the following Facility-Administered Medications: fentanyl, iohexol, iohexol, ropivacaine (pf) 2 mg/ml (0.2%), and ropivacaine (pf) 2 mg/ml (0.2%). Her primarily concern today is the Back Pain (low) and Hip Pain (bilateral)  Initial Vital Signs:  Pulse/HCG Rate: 66ECG Heart Rate: 68 Temp: 98.7 F (37.1 C) Resp: 16 BP: 129/84 SpO2: 99 %  BMI: Estimated body mass index is 53.04 kg/m as calculated from the following:   Height as of this encounter: 5\' 2"  (1.575 m).   Weight as of this encounter: 290 lb (131.5 kg).  Risk Assessment: Allergies: Reviewed. She is allergic to amoxicillin; penicillins; ampicillin; biaxin [clarithromycin]; erythromycin base; tramadol hcl; and zithromax [azithromycin].  Allergy Precautions: None required Coagulopathies: Reviewed. None identified.  Blood-thinner therapy: None at this time Active Infection(s): Reviewed. None identified. Ms. Burnett is afebrile  Site Confirmation: Ms. Parish was asked to confirm the procedure and laterality before marking the site Procedure checklist: Completed Consent: Before the procedure and under the influence of no sedative(s), amnesic(s), or anxiolytics, the patient was informed of the treatment options, risks and possible complications. To fulfill our ethical and legal obligations, as recommended by the American Medical Association's Code of  Ethics, I have informed the patient of my clinical impression; the nature and purpose of the treatment or procedure; the risks, benefits, and possible complications of the intervention; the alternatives, including doing nothing; the risk(s) and benefit(s) of the alternative treatment(s) or procedure(s); and the risk(s)  and benefit(s) of doing nothing. The patient was provided information about the general risks and possible complications associated with the procedure. These may include, but are not limited to: failure to achieve desired goals, infection, bleeding, organ or nerve damage, allergic reactions, paralysis, and death. In addition, the patient was informed of those risks and complications associated to the procedure, such as failure to decrease pain; infection; bleeding; organ or nerve damage with subsequent damage to sensory, motor, and/or autonomic systems, resulting in permanent pain, numbness, and/or weakness of one or several areas of the body; allergic reactions; (i.e.: anaphylactic reaction); and/or death. Furthermore, the patient was informed of those risks and complications associated with the medications. These include, but are not limited to: allergic reactions (i.e.: anaphylactic or anaphylactoid reaction(s)); adrenal axis suppression; blood sugar elevation that in diabetics may result in ketoacidosis or comma; water retention that in patients with history of congestive heart failure may result in shortness of breath, pulmonary edema, and decompensation with resultant heart failure; weight gain; swelling or edema; medication-induced neural toxicity; particulate matter embolism and blood vessel occlusion with resultant organ, and/or nervous system infarction; and/or aseptic necrosis of one or more joints. Finally, the patient was informed that Medicine is not an exact science; therefore, there is also the possibility of unforeseen or unpredictable risks and/or possible complications that may  result in a catastrophic outcome. The patient indicated having understood very clearly. We have given the patient no guarantees and we have made no promises. Enough time was given to the patient to ask questions, all of which were answered to the patient's satisfaction. Ms. Zamarron has indicated that she wanted to continue with the procedure. Attestation: I, the ordering provider, attest that I have discussed with the patient the benefits, risks, side-effects, alternatives, likelihood of achieving goals, and potential problems during recovery for the procedure that I have provided informed consent. Date  Time: 04/06/2019 10:57 AM  Pre-Procedure Preparation:  Monitoring: As per clinic protocol. Respiration, ETCO2, SpO2, BP, heart rate and rhythm monitor placed and checked for adequate function Safety Precautions: Patient was assessed for positional comfort and pressure points before starting the procedure. Time-out: I initiated and conducted the "Time-out" before starting the procedure, as per protocol. The patient was asked to participate by confirming the accuracy of the "Time Out" information. Verification of the correct person, site, and procedure were performed and confirmed by me, the nursing staff, and the patient. "Time-out" conducted as per Joint Commission's Universal Protocol (UP.01.01.01). Time: 1205  Description of Procedure:          Target Area: Inferior, posterior, aspect of the sacroiliac fissure Approach: Posterior, paraspinal, ipsilateral approach. Area Prepped: Entire Lower Lumbosacral Region Prepping solution: ChloraPrep (2% chlorhexidine gluconate and 70% isopropyl alcohol) Safety Precautions: Aspiration looking for blood return was conducted prior to all injections. At no point did we inject any substances, as a needle was being advanced. No attempts were made at seeking any paresthesias. Safe injection practices and needle disposal techniques used. Medications properly checked  for expiration dates. SDV (single dose vial) medications used. Description of the Procedure: Protocol guidelines were followed. The patient was placed in position over the procedure table. The target area was identified and the area prepped in the usual manner. Skin & deeper tissues infiltrated with local anesthetic. Appropriate amount of time allowed to pass for local anesthetics to take effect. The procedure needle was advanced under fluoroscopic guidance into the sacroiliac joint until a firm endpoint was obtained. Proper needle placement secured.  Negative aspiration confirmed. Solution injected in intermittent fashion, asking for systemic symptoms every 0.5cc of injectate. The needles were then removed and the area cleansed, making sure to leave some of the prepping solution back to take advantage of its long term bactericidal properties. Vitals:   04/06/19 1217 04/06/19 1224 04/06/19 1234 04/06/19 1243  BP: (!) 172/101 (!) 155/81 132/79 (!) 130/59  Pulse:      Resp: 19 14 16 16   Temp:      SpO2: 95% 95% 97% 97%  Weight:      Height:        Start Time: 1205 hrs. End Time: 1115 hrs. Materials:  Needle(s) Type: Spinal Needle Gauge: 22G Length: 3.5-in Medication(s): Please see orders for medications and dosing details. 5 cc solution made of 4 cc of 0.2% ropivacaine, 1 cc of Decadron 10 mg/cc.  2.5 cc injected LEFT intra-articular, 2.5 cc injected periarticular left SI joint 5 cc solution made of 4 cc of 0.2% ropivacaine, 1 cc of Decadron 10 mg/cc.  2.5 cc injected RIGHT intra-articular, 2.5 cc injected periarticular RIGHT SI joint Imaging Guidance (Non-Spinal):          Type of Imaging Technique: Fluoroscopy Guidance (Non-Spinal) Indication(s): Assistance in needle guidance and placement for procedures requiring needle placement in or near specific anatomical locations not easily accessible without such assistance. Exposure Time: Please see nurses notes. Contrast: Before injecting any  contrast, we confirmed that the patient did not have an allergy to iodine, shellfish, or radiological contrast. Once satisfactory needle placement was completed at the desired level, radiological contrast was injected. Contrast injected under live fluoroscopy. No contrast complications. See chart for type and volume of contrast used. Fluoroscopic Guidance: I was personally present during the use of fluoroscopy. "Tunnel Vision Technique" used to obtain the best possible view of the target area. Parallax error corrected before commencing the procedure. "Direction-depth-direction" technique used to introduce the needle under continuous pulsed fluoroscopy. Once target was reached, antero-posterior, oblique, and lateral fluoroscopic projection used confirm needle placement in all planes. Images permanently stored in EMR. Interpretation: I personally interpreted the imaging intraoperatively. Adequate needle placement confirmed in multiple planes. Appropriate spread of contrast into desired area was observed. No evidence of afferent or efferent intravascular uptake. Permanent images saved into the patient's record.  Antibiotic Prophylaxis:   Anti-infectives (From admission, onward)   None     Indication(s): None identified  Post-operative Assessment:  Post-procedure Vital Signs:  Pulse/HCG Rate: 66(!) 59 Temp: 98.7 F (37.1 C) Resp: 16 BP: (!) 130/59 SpO2: 97 %  EBL: None  Complications: No immediate post-treatment complications observed by team, or reported by patient.  Note: The patient tolerated the entire procedure well. A repeat set of vitals were taken after the procedure and the patient was kept under observation following institutional policy, for this type of procedure. Post-procedural neurological assessment was performed, showing return to baseline, prior to discharge. The patient was provided with post-procedure discharge instructions, including a section on how to identify potential  problems. Should any problems arise concerning this procedure, the patient was given instructions to immediately contact us, at any time, without hesitation. In any case, we plan to contact the patient by telephone for a follow-up status report regarding this interventional procedure.  Comments:  No additional relevant information.   Plan of Care   Imaging Orders     DG PAIN CLINIC C-ARM 1-60 MIN NO REPORT     DG Shoulder Left     DG Shoulder Right  Procedure Orders    No procedure(s) ordered today  Neck and shoulder pain, cervical spine MRI unremarkable, could be referred shoulder pain, start with B/L shoulder xrays  Medications ordered for procedure: Meds ordered this encounter  Medications  . iohexol (OMNIPAQUE) 180 MG/ML injection 10 mL    Must be Myelogram-compatible. If not available, you may substitute with a water-soluble, non-ionic, hypoallergenic, myelogram-compatible radiological contrast medium.  Marland Kitchen lidocaine (XYLOCAINE) 2 % (with pres) injection 400 mg  . fentaNYL (SUBLIMAZE) injection 25-50 mcg    Make sure Narcan is available in the pyxis when using this medication. In the event of respiratory depression (RR< 8/min): Titrate NARCAN (naloxone) in increments of 0.1 to 0.2 mg IV at 2-3 minute intervals, until desired degree of reversal.  . ropivacaine (PF) 2 mg/mL (0.2%) (NAROPIN) injection 1 mL  . ropivacaine (PF) 2 mg/mL (0.2%) (NAROPIN) injection 2 mL  . dexamethasone (DECADRON) injection 10 mg  . iohexol (OMNIPAQUE) 180 MG/ML injection 10 mL    Must be Myelogram-compatible. If not available, you may substitute with a water-soluble, non-ionic, hypoallergenic, myelogram-compatible radiological contrast medium.   Medications administered: We administered lidocaine and dexamethasone.  See the medical record for exact dosing, route, and time of administration.  Disposition: Discharge home  Discharge Date & Time: 04/06/2019; 1244 hrs.   Physician-requested Follow-up: No  follow-ups on file.  Future Appointments  Date Time Provider [place]  04/16/2019  4:45 PM Georgina Peer, RD [facility] None  05/05/2019  9:45 AM [name], MD [facility] None  06/03/2019  1:30 PM Dohmeier, Asencion Partridge, MD [facility] None   Primary Care Physician: [name], MD Location: [facility] Note by: [name], MD Date: 04/06/2019; Time: 1:03 PM  Disclaimer:  Medicine is not an exact science. The only guarantee in medicine is that nothing is guaranteed. It is important to note that the decision to proceed with this intervention was based on the information collected from the patient. The Data and conclusions were drawn from the patient's questionnaire, the interview, and the physical examination. Because the information was provided in large part by the patient, it cannot be guaranteed that it has not been purposely or unconsciously manipulated. Every effort has been made to obtain as much relevant data as possible for this evaluation. It is important to note that the conclusions that lead to this procedure are derived in large part from the available data. Always take into account that the treatment will also be dependent on availability of resources and existing treatment guidelines, considered by other Pain Management Practitioners as being common knowledge and practice, at the time of the intervention. For Medico-Legal purposes, it is also important to point out that variation in procedural techniques and pharmacological choices are the acceptable norm. The indications, contraindications, technique, and results of the above procedure should only be interpreted and judged by a Board-Certified Interventional Pain Specialist with extensive familiarity and expertise in the same exact procedure and technique.

## 2019-04-06 NOTE — Patient Instructions (Signed)

## 2019-04-07 ENCOUNTER — Telehealth: Payer: Self-pay | Admitting: *Deleted

## 2019-04-07 ENCOUNTER — Other Ambulatory Visit: Payer: Self-pay | Admitting: Neurology

## 2019-04-07 NOTE — Telephone Encounter (Signed)
No problems post procedure. 
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
## 2019-04-16 ENCOUNTER — Encounter: Payer: Self-pay | Admitting: Dietician

## 2019-04-16 ENCOUNTER — Encounter: Payer: BC Managed Care – PPO | Attending: Student | Admitting: Dietician

## 2019-04-16 ENCOUNTER — Other Ambulatory Visit: Payer: Self-pay

## 2019-04-16 VITALS — Ht 62.5 in | Wt 307.0 lb

## 2019-04-16 DIAGNOSIS — Z6841 Body Mass Index (BMI) 40.0 and over, adult: Secondary | ICD-10-CM | POA: Diagnosis not present

## 2019-04-16 DIAGNOSIS — K760 Fatty (change of) liver, not elsewhere classified: Secondary | ICD-10-CM

## 2019-04-16 DIAGNOSIS — E66813 Obesity, class 3: Secondary | ICD-10-CM

## 2019-04-16 NOTE — Progress Notes (Signed)
Medical Nutrition Therapy: Visit start time: 4401  end time: 1745   5th monthly visit for weight loss and nutrition  Assessment:  Diagnosis: obesity, fatty liver Medical history changes: no changes Psychosocial issues/ stress concerns: history of anxiety and depression; takes medication and sees therapist  Current weight: 307.0lbs Height: 5'2.5" Medications, supplement changes: reconciled list in medical record  Progress and evaluation:   Patient reports GI symptoms much improved, no abdominal pain or diarrhea recently.  Weight has increased by about 8lbs since previous visit; diuretic medication has been discontinued and patient reports increased swelling in ankles.   Patient continues to limit starchy foods and sweets.    Physical activity: none due to back pain  Dietary Intake:  Usual eating pattern includes 2 meals and 1 snacks per day. Dining out frequency: 1 meals per week. Fish, steak  Breakfast: frosted flakes cereal with 2% milk; sometimes skips Snack: none Lunch: 8/20 grilled chicken, sliced tomato and mayo (no bread) Snack: banana, watermelon, grapes, peaches Supper: sometimes skips if late lunch; meat + veg occasional starch like rice or beans or peas; lasagna with salad Snack: none or fruit Beverages: water, diet cranberry juice  Nutrition Care Education: nutrition for weight loss prior to bariatric surgery -- reviewed progress, discussed factors affecting weight including fluid retention, inactivity, possibly stress.     Nutritional Diagnosis:  [place]-1.4 Altered GI function As related to fatty infiltration of liver.  As evidenced by MD diagnostic report and patient report. [place]-3.3 Overweight/obesity As related to history of excess calories and inactivity.  As evidenced by patient with current BMI of 55, working on diet for weight loss.  Intervention:   Instruction as noted above.  Reviewed important points to consider prior to bariatric surgery, including support  system, factors for long-term success, vitamin and mineral supplementation lifelong.  Reviewed patient's dietary progress; encouraged ongoing emphasis on lean proteins and low-carb vegetables.   Education Materials given:  Marland Kitchen Diet stage template . Pre-op goals . Goals/ instructions   Learner/ who was taught:  . Patient   Level of understanding: Marland Kitchen Verbalizes/ demonstrates competency   Demonstrated degree of understanding via:   Teach back Learning barriers: . None   Willingness to learn/ readiness for change: . Eager, change in progress   Monitoring and Evaluation:  Dietary intake, exercise, and body weight      follow up: 05/18/19 at 1:15pm

## 2019-04-16 NOTE — Patient Instructions (Signed)
   Start checking out protein drinks to see what you like and what's available. Make sure the drinks are low in carbs (less than 5grams) and high in protein (15grams or more).   Monitor how much you drink each day, keep track of how many ounces you drink. The goal is to drink at least 64oz of fluids each day. All liquids count.   Continue to eat lean protein foods including lean chicken, fish, pork chop, or eggs, and include pleanty of low-carb veggies like green beans, greens, cabbage, squash, broccoli, salad, etc.   Keep to small portions of any starchy foods like cereal, bread, rice, pasta/ noodles, and potatoes.

## 2019-04-28 DEATH — deceased

## 2019-05-05 ENCOUNTER — Ambulatory Visit
Payer: BC Managed Care – PPO | Attending: Student in an Organized Health Care Education/Training Program | Admitting: Student in an Organized Health Care Education/Training Program

## 2019-05-05 ENCOUNTER — Other Ambulatory Visit: Payer: Self-pay

## 2019-05-05 ENCOUNTER — Encounter: Payer: Self-pay | Admitting: Student in an Organized Health Care Education/Training Program

## 2019-05-05 NOTE — Progress Notes (Signed)
I attempted to call the patient however no response. Voicemail left instructing patient to call front desk office at [phone] to reschedule appointment. -Dr Everlena Mackley  

## 2019-05-19 ENCOUNTER — Telehealth: Payer: Self-pay | Admitting: Dietician

## 2019-05-19 NOTE — Telephone Encounter (Signed)
Called patient to schedule/ reschedule visit from 05/18/19, as the appointment was inadvertently left off schedule. Unable to leave a message due to full voicemail.

## 2019-05-21 ENCOUNTER — Telehealth: Payer: Self-pay | Admitting: Dietician

## 2019-05-21 NOTE — Telephone Encounter (Signed)
Attempted to reach patient again to reschedule her supervised weight loss visit for September. Unable to leave message due to full voicemail. Sent email message also; have not yet heard back.

## 2019-05-26 ENCOUNTER — Encounter: Payer: BC Managed Care – PPO | Admitting: Student in an Organized Health Care Education/Training Program

## 2019-06-03 ENCOUNTER — Ambulatory Visit: Payer: BC Managed Care – PPO | Admitting: Neurology

## 2019-06-04 ENCOUNTER — Encounter: Payer: Self-pay | Admitting: Neurology

## 2019-06-11 ENCOUNTER — Telehealth: Payer: Self-pay | Admitting: Neurology

## 2019-06-11 NOTE — Telephone Encounter (Signed)
Pt passed away

## 2019-06-19 ENCOUNTER — Telehealth: Payer: Self-pay | Admitting: Neurology

## 2019-06-19 NOTE — Telephone Encounter (Signed)
  I found a note by [name] [name], not indicating if she is a provider, a relative, or else.   I am very sorry to hear about [name] [name] passing.  Larey Seat, MD

## 2021-04-02 NOTE — Telephone Encounter (Signed)
This patient died in 05-2019. Please adjust the medical record. CD
